# Patient Record
Sex: Female | Born: 1990
Health system: Southern US, Community
[De-identification: ages and names within clinical notes are randomized; demographics above are authoritative.]

## PROBLEM LIST (undated history)

## (undated) ENCOUNTER — Inpatient Hospital Stay (HOSPITAL_COMMUNITY): Payer: Self-pay

## (undated) ENCOUNTER — Emergency Department (HOSPITAL_BASED_OUTPATIENT_CLINIC_OR_DEPARTMENT_OTHER): Admission: EM | Payer: Commercial Managed Care - PPO | Source: Home / Self Care

## (undated) ENCOUNTER — Emergency Department (HOSPITAL_BASED_OUTPATIENT_CLINIC_OR_DEPARTMENT_OTHER): Admission: EM | Payer: Medicaid Other | Source: Home / Self Care

## (undated) DIAGNOSIS — Z972 Presence of dental prosthetic device (complete) (partial): Secondary | ICD-10-CM

## (undated) DIAGNOSIS — R51 Headache: Secondary | ICD-10-CM

## (undated) DIAGNOSIS — O26613 Liver and biliary tract disorders in pregnancy, third trimester: Secondary | ICD-10-CM

## (undated) DIAGNOSIS — D219 Benign neoplasm of connective and other soft tissue, unspecified: Secondary | ICD-10-CM

## (undated) DIAGNOSIS — Z862 Personal history of diseases of the blood and blood-forming organs and certain disorders involving the immune mechanism: Secondary | ICD-10-CM

## (undated) DIAGNOSIS — Z8669 Personal history of other diseases of the nervous system and sense organs: Secondary | ICD-10-CM

## (undated) DIAGNOSIS — Z973 Presence of spectacles and contact lenses: Secondary | ICD-10-CM

## (undated) DIAGNOSIS — K5909 Other constipation: Secondary | ICD-10-CM

## (undated) DIAGNOSIS — O288 Other abnormal findings on antenatal screening of mother: Secondary | ICD-10-CM

## (undated) DIAGNOSIS — K831 Obstruction of bile duct: Secondary | ICD-10-CM

## (undated) DIAGNOSIS — F32A Depression, unspecified: Secondary | ICD-10-CM

## (undated) DIAGNOSIS — J45909 Unspecified asthma, uncomplicated: Secondary | ICD-10-CM

## (undated) DIAGNOSIS — F411 Generalized anxiety disorder: Secondary | ICD-10-CM

## (undated) DIAGNOSIS — D649 Anemia, unspecified: Secondary | ICD-10-CM

## (undated) DIAGNOSIS — F329 Major depressive disorder, single episode, unspecified: Secondary | ICD-10-CM

## (undated) DIAGNOSIS — L309 Dermatitis, unspecified: Secondary | ICD-10-CM

## (undated) HISTORY — DX: Benign neoplasm of connective and other soft tissue, unspecified: D21.9

## (undated) HISTORY — PX: WISDOM TOOTH EXTRACTION: SHX21

## (undated) HISTORY — DX: Depression, unspecified: F32.A

## (undated) HISTORY — DX: Other abnormal findings on antenatal screening of mother: O28.8

## (undated) HISTORY — PX: NO PAST SURGERIES: SHX2092

## (undated) HISTORY — PX: MOUTH SURGERY: SHX715

## (undated) HISTORY — PX: TUBAL LIGATION: SHX77

## (undated) HISTORY — DX: Major depressive disorder, single episode, unspecified: F32.9

## (undated) HISTORY — PX: TYMPANOSTOMY TUBE PLACEMENT: SHX32

## (undated) HISTORY — DX: Anemia, unspecified: D64.9

---
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
## 2011-06-29 ENCOUNTER — Other Ambulatory Visit: Payer: Self-pay | Admitting: Obstetrics and Gynecology

## 2011-06-29 DIAGNOSIS — N6310 Unspecified lump in the right breast, unspecified quadrant: Secondary | ICD-10-CM

## 2011-07-01 ENCOUNTER — Other Ambulatory Visit: Payer: Self-pay | Admitting: Obstetrics and Gynecology

## 2011-07-01 ENCOUNTER — Ambulatory Visit
Admission: RE | Admit: 2011-07-01 | Discharge: 2011-07-01 | Disposition: A | Discharge status: Home / Self Care | Payer: No Typology Code available for payment source | Source: Ambulatory Visit | Attending: Obstetrics and Gynecology | Admitting: Obstetrics and Gynecology

## 2011-07-01 DIAGNOSIS — N6310 Unspecified lump in the right breast, unspecified quadrant: Secondary | ICD-10-CM

## 2012-07-16 ENCOUNTER — Emergency Department (HOSPITAL_COMMUNITY)
Admission: EM | Admit: 2012-07-16 | Discharge: 2012-07-16 | Disposition: A | Discharge status: Home / Self Care | Payer: BC Managed Care – PPO

## 2012-07-16 NOTE — ED Notes (Signed)
Pt decided to sit with her boyfriend in fast track 8 rather then being seen. Nurse aware

## 2013-01-04 ENCOUNTER — Emergency Department (HOSPITAL_COMMUNITY)
Admission: EM | Admit: 2013-01-04 | Discharge: 2013-01-04 | Disposition: A | Discharge status: Home / Self Care | Payer: Medicaid Other | Attending: Emergency Medicine | Admitting: Emergency Medicine

## 2013-01-04 ENCOUNTER — Encounter (HOSPITAL_COMMUNITY): Payer: Self-pay

## 2013-01-04 DIAGNOSIS — N946 Dysmenorrhea, unspecified: Secondary | ICD-10-CM

## 2013-01-04 DIAGNOSIS — Z3202 Encounter for pregnancy test, result negative: Secondary | ICD-10-CM | POA: Insufficient documentation

## 2013-01-04 LAB — URINALYSIS, ROUTINE W REFLEX MICROSCOPIC
Nitrite: NEGATIVE
Specific Gravity, Urine: 1.029 (ref 1.005–1.030)
Urobilinogen, UA: 1 mg/dL (ref 0.0–1.0)

## 2013-01-04 LAB — POCT PREGNANCY, URINE: Preg Test, Ur: NEGATIVE

## 2013-01-04 LAB — URINE MICROSCOPIC-ADD ON

## 2013-01-04 NOTE — ED Provider Notes (Signed)
CSN: [id number]     Arrival date & time 01/04/13  2156 History   First MD Initiated Contact with Patient 01/04/13 2308     Chief Complaint  Patient presents with  . Vaginal Bleeding   (Consider location/radiation/quality/duration/timing/severity/associated sxs/prior Treatment) HPI  Patient is a generally healthy young woman who presents out of concern for irregular vaginal bleeding. She says her LMP was approx 3 wks ago and is usually every 4 week. Today, she developed some vaginal bleeding with midline, mild cramping. No N/V. No unusual vaginal d/c. Patient is not currently taking/using an pregnancy contraceptive.   Lower abd pain is cramping, mild and nonradiating.   History reviewed. No pertinent past medical history. Past Surgical History  Procedure Laterality Date  . Mouth surgery     History reviewed. No pertinent family history. History  Substance Use Topics  . Smoking status: Never Smoker   . Smokeless tobacco: Not on file  . Alcohol Use: No   OB History   Grav Para Term Preterm Abortions TAB SAB Ect Mult Living                 Review of Systems Gen: no weight loss, fevers, chills, night sweats Eyes: no discharge or drainage, no occular pain or visual changes Nose: no epistaxis or rhinorrhea Mouth: no dental pain, no sore throat Neck: no neck pain Lungs: no SOB, cough, wheezing CV: no chest pain, palpitations, dependent edema or orthopnea Abd: no abdominal pain, nausea, vomiting GU: As per history of present illness, otherwise negative MSK: no myalgias or arthralgias Neuro: no headache, no focal neurologic deficits Skin: no rash Psyche: negative.  Allergies  Review of patient's allergies indicates no known allergies.  Home Medications  No current outpatient prescriptions on file. BP 111/77  Pulse 88  Temp(Src) 98 F (36.7 C) (Oral)  Resp 16  Wt 110 lb (49.896 kg)  SpO2 100% Physical Exam Gen: well developed and well nourished appearing Head:  NCAT Eyes: PERL, EOMI Nose: no epistaixis or rhinorrhea Mouth/throat: mucosa is moist and pink Neck: supple, no stridor Lungs: CTA B, no wheezing, rhonchi or rales CV: RRR, no murmur Abd: soft, notender, nondistended Back: no ttp, no cva ttp Skin: no rashese, wnl Neuro: CN ii-xii grossly intact, no focal deficits Psyche; normal affect,  calm and cooperative.   ED Course  Procedures (including critical care time) Labs Review Labs Reviewed  URINALYSIS, ROUTINE W REFLEX MICROSCOPIC - Abnormal; Notable for the following:    APPearance CLOUDY (*)    Hgb urine dipstick MODERATE (*)    Protein, ur 30 (*)    All other components within normal limits  URINE MICROSCOPIC-ADD ON - Abnormal; Notable for the following:    Squamous Epithelial / LPF FEW (*)    All other components within normal limits  POCT PREGNANCY, URINE    MDM  Patient with dysmenorrhea. Reassured that she is not pregnant and that irregular menstrual bleeding is not uncommon. The patient is referral to PCP for further evaluation. She will call [employer] for referral to PCP in network.     Brandt Loosen, MD 01/04/13 [phone]

## 2013-01-04 NOTE — ED Notes (Signed)
Pt reports abnormal heavy vaginal bleeding and lower abd pain starting today. Pt reports increase pain with walking. Pt reports her LNM was mid August, pt is unsure of the actual date.

## 2013-05-02 DIAGNOSIS — R519 Headache, unspecified: Secondary | ICD-10-CM | POA: Insufficient documentation

## 2013-07-16 ENCOUNTER — Encounter (HOSPITAL_COMMUNITY): Payer: Self-pay | Admitting: Emergency Medicine

## 2013-07-16 ENCOUNTER — Emergency Department (HOSPITAL_COMMUNITY)
Admission: EM | Admit: 2013-07-16 | Discharge: 2013-07-17 | Disposition: A | Discharge status: Home / Self Care | Payer: Medicaid Other | Attending: Emergency Medicine | Admitting: Emergency Medicine

## 2013-07-16 DIAGNOSIS — H53149 Visual discomfort, unspecified: Secondary | ICD-10-CM | POA: Insufficient documentation

## 2013-07-16 DIAGNOSIS — Z79899 Other long term (current) drug therapy: Secondary | ICD-10-CM | POA: Insufficient documentation

## 2013-07-16 DIAGNOSIS — R51 Headache: Secondary | ICD-10-CM | POA: Insufficient documentation

## 2013-07-16 DIAGNOSIS — R519 Headache, unspecified: Secondary | ICD-10-CM

## 2013-07-16 NOTE — ED Notes (Signed)
Per pt report: pt c/o of HA for about 4 days.  Pt has tried tylenol and ibuprofen with mild temporary improvement.  Pt denies nausea or sensitivity to light.  Pt has never had a headache for 4 days before and is concerned.  Pt a/o x 4.  Skin warm and dry.  Pt ambulatory in triage.

## 2013-07-17 MED ORDER — IBUPROFEN 800 MG PO TABS: 800.0000 mg | ORAL_TABLET | Freq: Three times a day (TID) | ORAL | Status: DC | PRN

## 2013-07-17 MED ORDER — DIPHENHYDRAMINE HCL 50 MG/ML IJ SOLN
25.0000 mg | Freq: Once | INTRAMUSCULAR | Status: AC
  Administered 2013-07-17: 25 mg via INTRAVENOUS
  Filled 2013-07-17: qty 1

## 2013-07-17 MED ORDER — KETOROLAC TROMETHAMINE 30 MG/ML IJ SOLN
30.0000 mg | Freq: Once | INTRAMUSCULAR | Status: AC
  Administered 2013-07-17: 30 mg via INTRAVENOUS
  Filled 2013-07-17: qty 1

## 2013-07-17 MED ORDER — PROCHLORPERAZINE EDISYLATE 5 MG/ML IJ SOLN
10.0000 mg | Freq: Once | INTRAMUSCULAR | Status: AC
  Administered 2013-07-17: 10 mg via INTRAVENOUS
  Filled 2013-07-17: qty 2

## 2013-07-17 MED ORDER — SODIUM CHLORIDE 0.9 % IV BOLUS (SEPSIS)
2000.0000 mL | Freq: Once | INTRAVENOUS | Status: AC
  Administered 2013-07-17: 2000 mL via INTRAVENOUS

## 2013-07-17 NOTE — ED Provider Notes (Signed)
Medical screening examination/treatment/procedure(s) were performed by non-physician practitioner and as supervising physician I was immediately available for consultation/collaboration.   Delora Fuel, MD 65/46/50 [id number]

## 2013-07-17 NOTE — ED Notes (Signed)
Pt states has had a headache x 4 days, states frontal headache, states would take medication at home and would go to sleep but would wake up and still have headache, states would ease off some then come back, states gets hunger headaches but this had not gone away with eating. Pt states it's more of an aggravating/tension headache.

## 2013-07-17 NOTE — Discharge Instructions (Signed)
Return here as needed.  Increase your fluid intake. 

## 2013-07-17 NOTE — ED Provider Notes (Signed)
CSN: [id number]     Arrival date & time 07/16/13  2255 History   First MD Initiated Contact with Patient 07/17/13 0012     Chief Complaint  Patient presents with  . Headache     (Consider location/radiation/quality/duration/timing/severity/associated sxs/prior Treatment) HPI: [name] is a 23 year old woman who presents to the Emergency Department with chief complaint of headache.  She reports that the headache has been on and off for the last four days and describes it as an "aggravating tension" in her bilateral temples.  At the worst it gets to 6-7/10 in severity.  She has some associated photophobia and states that it is better when she lies down and rests.  She states that ibuprofen and alleve did not alleviate it but tylenol PM helped slightly.  No associated nausea, vomiting, vision changes, or focal neurologic deficits.  Her only past headaches she describes as "hunger headaches" when she had not eaten in awhile, which felt similar but never lasted this long.  She has no first degree relatives with history of migraines, but does have an aunt with "bad headaches" of unknown type.  She does report recent stress with midterms at school.  She also notes that she is nearsighted but broke her glasses about a year ago and has not had corrective lenses since.  She is not regularly evaluated by an ophthalmologist and currently has no PCP.  History reviewed. No pertinent past medical history. Past Surgical History  Procedure Laterality Date  . Mouth surgery    . Wisdom tooth extraction     No family history on file. History  Substance Use Topics  . Smoking status: Never Smoker   . Smokeless tobacco: Not on file  . Alcohol Use: No   OB History   Grav Para Term Preterm Abortions TAB SAB Ect Mult Living                 Review of Systems All other systems negative except as documented in the HPI. All pertinent positives and negatives as reviewed in the HPI.   Allergies  Review of  patient's allergies indicates no known allergies.  Home Medications   Current Outpatient Rx  Name  Route  Sig  Dispense  Refill  . albuterol (PROVENTIL HFA;VENTOLIN HFA) 108 (90 BASE) MCG/ACT inhaler   Inhalation   Inhale 2 puffs into the lungs every 6 (six) hours as needed for wheezing or shortness of breath.         . diphenhydramine-acetaminophen (TYLENOL PM) 25-500 MG TABS   Oral   Take 1 tablet by mouth at bedtime as needed (pain).         Marland Kitchen ibuprofen (ADVIL,MOTRIN) 200 MG tablet   Oral   Take 800 mg by mouth every 6 (six) hours as needed for headache or moderate pain.          BP 113/80  Pulse 81  Temp(Src) 98.2 F (36.8 C) (Oral)  Resp 18  Ht 5' (1.524 m)  Wt 122 lb (55.339 kg)  BMI 23.83 kg/m2  SpO2 99%  LMP 07/09/2013 Physical Exam  Nursing note and vitals reviewed. Constitutional: She is oriented to person, place, and time. She appears well-developed and well-nourished. No distress.  HENT:  Head: Normocephalic and atraumatic.  Mouth/Throat: Oropharynx is clear and moist.  Tenderness to palpation of bilateral temples  Eyes: Conjunctivae and EOM are normal. Pupils are equal, round, and reactive to light.  Neck: Normal range of motion. Neck supple.  Cardiovascular:  Normal rate, regular rhythm and normal heart sounds.  Exam reveals no gallop and no friction rub.   No murmur heard. Pulmonary/Chest: Effort normal and breath sounds normal. No respiratory distress.  Neurological: She is alert and oriented to person, place, and time. She has normal reflexes. No cranial nerve deficit. She exhibits normal muscle tone. Coordination normal.  Skin: Skin is warm and dry.    ED Course  Procedures (including critical care time) Patient is feeling resolution of her headache following IV medications and fluids.  Patient is advised to return here as needed.  Told to followup with her primary care Dr. patient has no neurological deficits noted on exam.  Patient most likely  had a migraine type headache, based on her history of present illness and physical exam    Brent General, PA-C 07/17/13 0129

## 2013-08-30 ENCOUNTER — Encounter (HOSPITAL_COMMUNITY): Payer: Self-pay | Admitting: Emergency Medicine

## 2013-08-30 ENCOUNTER — Emergency Department (HOSPITAL_COMMUNITY)
Admission: EM | Admit: 2013-08-30 | Discharge: 2013-08-30 | Disposition: A | Discharge status: Home / Self Care | Payer: 59 | Attending: Emergency Medicine | Admitting: Emergency Medicine

## 2013-08-30 DIAGNOSIS — J029 Acute pharyngitis, unspecified: Secondary | ICD-10-CM | POA: Insufficient documentation

## 2013-08-30 DIAGNOSIS — H9209 Otalgia, unspecified ear: Secondary | ICD-10-CM | POA: Insufficient documentation

## 2013-08-30 DIAGNOSIS — R590 Localized enlarged lymph nodes: Secondary | ICD-10-CM

## 2013-08-30 DIAGNOSIS — Z79899 Other long term (current) drug therapy: Secondary | ICD-10-CM | POA: Insufficient documentation

## 2013-08-30 DIAGNOSIS — R599 Enlarged lymph nodes, unspecified: Secondary | ICD-10-CM | POA: Insufficient documentation

## 2013-08-30 MED ORDER — DEXAMETHASONE SODIUM PHOSPHATE 10 MG/ML IJ SOLN
10.0000 mg | Freq: Once | INTRAMUSCULAR | Status: AC
  Administered 2013-08-30: 10 mg via INTRAMUSCULAR
  Filled 2013-08-30: qty 1

## 2013-08-30 NOTE — ED Notes (Signed)
Sore throat and sore L sided neck since Tuesday. Denies post nasal drip or congestion. States she was singing hard so she could have pulled something in her neck.

## 2013-08-30 NOTE — ED Notes (Signed)
Pt. reports pain at throat with swelling onset Tuesday this week , airway intact / respirations unlabored . Denies fever or chills. No cough or congestion .

## 2013-08-30 NOTE — ED Provider Notes (Signed)
CSN: [id number]     Arrival date & time 08/30/13  2049 History  This chart was scribed for non-physician practitioner, Monico Blitz, PA-C working with Veryl Speak, MD by Frederich Balding, ED scribe. This patient was seen in room TR07C/TR07C and the patient's care was started at 10:04 PM.    Chief Complaint  Patient presents with  . Sore Throat   The history is provided by the patient. No language interpreter was used.   HPI Comments: Wanda Nixon is a 23 y.o. female who presents to the Emergency Department complaining of gradual onset sore throat that started 3 days ago. Pain radiates to her left ear. Swallowing worsens the pain. She states she is having increased swelling to her lymph node. Denies fever, cough,  Difficulty swallowing, reduced by mouth intake, change in her voice, rhinorrhea.  History reviewed. No pertinent past medical history. Past Surgical History  Procedure Laterality Date  . Mouth surgery    . Wisdom tooth extraction     No family history on file. History  Substance Use Topics  . Smoking status: Never Smoker   . Smokeless tobacco: Not on file  . Alcohol Use: No   OB History   Grav Para Term Preterm Abortions TAB SAB Ect Mult Living                 Review of Systems  Constitutional: Negative for fever.  HENT: Positive for ear pain and sore throat.   Respiratory: Negative for cough.   All other systems reviewed and are negative.  Allergies  Review of patient's allergies indicates no known allergies.  Home Medications   Prior to Admission medications   Medication Sig Start Date End Date Taking? Authorizing Provider  albuterol (PROVENTIL HFA;VENTOLIN HFA) 108 (90 BASE) MCG/ACT inhaler Inhale 2 puffs into the lungs every 6 (six) hours as needed for wheezing or shortness of breath.    Historical Provider, MD  diphenhydramine-acetaminophen (TYLENOL PM) 25-500 MG TABS Take 1 tablet by mouth at bedtime as needed (pain).    Historical Provider, MD   ibuprofen (ADVIL,MOTRIN) 200 MG tablet Take 800 mg by mouth every 6 (six) hours as needed for headache or moderate pain.    Historical Provider, MD  ibuprofen (ADVIL,MOTRIN) 800 MG tablet Take 1 tablet (800 mg total) by mouth every 8 (eight) hours as needed. 07/17/13   [place], PA-C   BP 115/78  Pulse 98  Temp(Src) 98.8 F (37.1 C)  Resp 20  Ht 5' (1.524 m)  Wt 122 lb (55.339 kg)  BMI 23.83 kg/m2  SpO2 100%  Physical Exam  Nursing note and vitals reviewed. Constitutional: She is oriented to person, place, and time. She appears well-developed and well-nourished. No distress.  HENT:  Head: Normocephalic.  Left Ear: Tympanic membrane and ear canal normal.  Mouth/Throat: No oropharyngeal exudate or posterior oropharyngeal edema.  No drooling or stridor. Posterior pharynx mildly injected no significant tonsillar hypertrophy. No exudate. Soft palate rises symmetrically. No TTP or induration under tongue.    Bilateral tympanic membranes with normal architecture good light reflex and no erythema.  Eyes: Conjunctivae and EOM are normal.  Neck: Normal range of motion. Neck supple.  Bilateral anterior cervical lymphadenopathy with tenderness to palpation  Cardiovascular: Normal rate.   Pulmonary/Chest: Effort normal. No stridor.  Musculoskeletal: Normal range of motion.  Lymphadenopathy:    She has cervical adenopathy.  Left anterior lymphadenopathy with tenderness.   Neurological: She is alert and oriented to person, place, and  time.  Psychiatric: She has a normal mood and affect.    ED Course  Procedures (including critical care time)  DIAGNOSTIC STUDIES: Oxygen Saturation is 100% on RA, normal by my interpretation.    COORDINATION OF CARE: 10:07 PM-Discussed treatment plan which includes decadron and motrin with pt at bedside and pt agreed to plan. Advised pt that strep test is not necessary based on history and physical exam. Return precautions given.  Labs  Review Labs Reviewed - No data to display  Imaging Review No results found.   EKG Interpretation None      MDM   Final diagnoses:  None    Filed Vitals:   08/30/13 2102 08/30/13 2144 08/30/13 2214  BP: 115/78  121/84  Pulse: 98  98  Temp:  98.8 F (37.1 C) 98.7 F (37.1 C)  TempSrc: Oral  Oral  Resp: 20  16  Height: 5' (1.524 m)    Weight: 122 lb (55.339 kg)    SpO2: 100%  98%    Medications  dexamethasone (DECADRON) injection 10 mg (10 mg Intramuscular Given 08/30/13 2216)    Wanda Nixon [facility] is a 23 y.o. female presenting with sore throat, ear pain. And anterior cervical lymphadenopathy. No testing for strep pharyngitis based on the Centor criteria. No signs of airway compromise.  Evaluation does not show pathology that would require ongoing emergent intervention or inpatient treatment. Pt is hemodynamically stable and mentating appropriately. Discussed findings and plan with patient/guardian, who agrees with care plan. All questions answered. Return precautions discussed and outpatient follow up given.   Note: Portions of this report may have been transcribed using voice recognition software. Every effort was made to ensure accuracy; however, inadvertent computerized transcription errors may be present   I personally performed the services described in this documentation, which was scribed in my presence. The recorded information has been reviewed and is accurate.  Monico Blitz, PA-C 09/04/13 1533

## 2013-08-30 NOTE — Discharge Instructions (Signed)
For pain control please take Ibuprofen (also known as Motrin or Advil) 400mg  (this is normally 2 over the counter pills) every 6 hours. Take with food to minimize stomach irritation.  Please follow with your primary care doctor in the next 2 days for a check-up. They must obtain records for further management.   Do not hesitate to return to the Emergency Department for any new, worsening or concerning symptoms.   Lymphadenopathy Lymphadenopathy means "disease of the lymph glands." But the term is usually used to describe swollen or enlarged lymph glands, also called lymph nodes. These are the bean-shaped organs found in many locations including the neck, underarm, and groin. Lymph glands are part of the immune system, which fights infections in your body. Lymphadenopathy can occur in just one area of the body, such as the neck, or it can be generalized, with lymph node enlargement in several areas. The nodes found in the neck are the most common sites of lymphadenopathy. CAUSES  When your immune system responds to germs (such as viruses or bacteria ), infection-fighting cells and fluid build up. This causes the glands to grow in size. This is usually not something to worry about. Sometimes, the glands themselves can become infected and inflamed. This is called lymphadenitis. Enlarged lymph nodes can be caused by many diseases:  Bacterial disease, such as strep throat or a skin infection.  Viral disease, such as a common cold.  Other germs, such as lyme disease, tuberculosis, or sexually transmitted diseases.  Cancers, such as lymphoma (cancer of the lymphatic system) or leukemia (cancer of the white blood cells).  Inflammatory diseases such as lupus or rheumatoid arthritis.  Reactions to medications. Many of the diseases above are rare, but important. This is why you should see your caregiver if you have lymphadenopathy. SYMPTOMS   Swollen, enlarged lumps in the neck, back of the head or  other locations.  Tenderness.  Warmth or redness of the skin over the lymph nodes.  Fever. DIAGNOSIS  Enlarged lymph nodes are often near the source of infection. They can help healthcare providers diagnose your illness. For instance:   Swollen lymph nodes around the jaw might be caused by an infection in the mouth.  Enlarged glands in the neck often signal a throat infection.  Lymph nodes that are swollen in more than one area often indicate an illness caused by a virus. Your caregiver most likely will know what is causing your lymphadenopathy after listening to your history and examining you. Blood tests, x-rays or other tests may be needed. If the cause of the enlarged lymph node cannot be found, and it does not go away by itself, then a biopsy may be needed. Your caregiver will discuss this with you. TREATMENT  Treatment for your enlarged lymph nodes will depend on the cause. Many times the nodes will shrink to normal size by themselves, with no treatment. Antibiotics or other medicines may be needed for infection. Only take over-the-counter or prescription medicines for pain, discomfort or fever as directed by your caregiver. HOME CARE INSTRUCTIONS  Swollen lymph glands usually return to normal when the underlying medical condition goes away. If they persist, contact your health-care provider. He/she might prescribe antibiotics or other treatments, depending on the diagnosis. Take any medications exactly as prescribed. Keep any follow-up appointments made to check on the condition of your enlarged nodes.  SEEK MEDICAL CARE IF:   Swelling lasts for more than two weeks.  You have symptoms such as weight loss,  night sweats, fatigue or fever that does not go away.  The lymph nodes are hard, seem fixed to the skin or are growing rapidly.  Skin over the lymph nodes is red and inflamed. This could mean there is an infection. SEEK IMMEDIATE MEDICAL CARE IF:   Fluid starts leaking from the  area of the enlarged lymph node.  You develop a fever of 102 F (38.9 C) or greater.  Severe pain develops (not necessarily at the site of a large lymph node).  You develop chest pain or shortness of breath.  You develop worsening abdominal pain. MAKE SURE YOU:   Understand these instructions.  Will watch your condition.  Will get help right away if you are not doing well or get worse. Document Released: 01/26/2008 Document Revised: 07/11/2011 Document Reviewed: 01/26/2008 [facility] Patient Information 2014 [place].

## 2013-09-07 NOTE — ED Provider Notes (Signed)
Medical screening examination/treatment/procedure(s) were performed by non-physician practitioner and as supervising physician I was immediately available for consultation/collaboration.     Summar Mcglothlin, MD 09/07/13 1930 

## 2013-10-05 ENCOUNTER — Emergency Department (HOSPITAL_COMMUNITY): Payer: BC Managed Care – PPO

## 2013-10-05 ENCOUNTER — Emergency Department (HOSPITAL_COMMUNITY)
Admission: EM | Admit: 2013-10-05 | Discharge: 2013-10-05 | Disposition: A | Discharge status: Home / Self Care | Payer: BC Managed Care – PPO | Attending: Emergency Medicine | Admitting: Emergency Medicine

## 2013-10-05 ENCOUNTER — Encounter (HOSPITAL_COMMUNITY): Payer: Self-pay | Admitting: Emergency Medicine

## 2013-10-05 DIAGNOSIS — T50905A Adverse effect of unspecified drugs, medicaments and biological substances, initial encounter: Secondary | ICD-10-CM

## 2013-10-05 DIAGNOSIS — R000 Tachycardia, unspecified: Secondary | ICD-10-CM | POA: Insufficient documentation

## 2013-10-05 DIAGNOSIS — T38801A Poisoning by unspecified hormones and synthetic substitutes, accidental (unintentional), initial encounter: Secondary | ICD-10-CM | POA: Insufficient documentation

## 2013-10-05 DIAGNOSIS — Z79899 Other long term (current) drug therapy: Secondary | ICD-10-CM | POA: Insufficient documentation

## 2013-10-05 DIAGNOSIS — Z3202 Encounter for pregnancy test, result negative: Secondary | ICD-10-CM | POA: Insufficient documentation

## 2013-10-05 DIAGNOSIS — Y939 Activity, unspecified: Secondary | ICD-10-CM | POA: Insufficient documentation

## 2013-10-05 DIAGNOSIS — T380X1A Poisoning by glucocorticoids and synthetic analogues, accidental (unintentional), initial encounter: Secondary | ICD-10-CM | POA: Insufficient documentation

## 2013-10-05 DIAGNOSIS — Y929 Unspecified place or not applicable: Secondary | ICD-10-CM | POA: Insufficient documentation

## 2013-10-05 DIAGNOSIS — F411 Generalized anxiety disorder: Secondary | ICD-10-CM | POA: Insufficient documentation

## 2013-10-05 LAB — URINALYSIS, ROUTINE W REFLEX MICROSCOPIC
BILIRUBIN URINE: NEGATIVE
Glucose, UA: NEGATIVE mg/dL
Ketones, ur: NEGATIVE mg/dL
NITRITE: NEGATIVE
PH: 6.5 (ref 5.0–8.0)
Protein, ur: NEGATIVE mg/dL
SPECIFIC GRAVITY, URINE: 1.001 — AB (ref 1.005–1.030)
UROBILINOGEN UA: 0.2 mg/dL (ref 0.0–1.0)

## 2013-10-05 LAB — URINE MICROSCOPIC-ADD ON

## 2013-10-05 LAB — I-STAT CHEM 8, ED
BUN: 10 mg/dL (ref 6–23)
CHLORIDE: 105 meq/L (ref 96–112)
Calcium, Ion: 1.18 mmol/L (ref 1.12–1.23)
Creatinine, Ser: 0.8 mg/dL (ref 0.50–1.10)
GLUCOSE: 119 mg/dL — AB (ref 70–99)
HEMATOCRIT: 41 % (ref 36.0–46.0)
HEMOGLOBIN: 13.9 g/dL (ref 12.0–15.0)
POTASSIUM: 3.8 meq/L (ref 3.7–5.3)
SODIUM: 142 meq/L (ref 137–147)
TCO2: 25 mmol/L (ref 0–100)

## 2013-10-05 LAB — PREGNANCY, URINE: Preg Test, Ur: NEGATIVE

## 2013-10-05 MED ORDER — LORAZEPAM 2 MG/ML IJ SOLN
0.5000 mg | Freq: Once | INTRAMUSCULAR | Status: AC
  Administered 2013-10-05: 0.5 mg via INTRAVENOUS
  Filled 2013-10-05: qty 1

## 2013-10-05 MED ORDER — SODIUM CHLORIDE 0.9 % IV BOLUS (SEPSIS)
1000.0000 mL | Freq: Once | INTRAVENOUS | Status: AC
  Administered 2013-10-05: 1000 mL via INTRAVENOUS

## 2013-10-05 MED ORDER — LORAZEPAM 0.5 MG PO TABS: 0.5000 mg | ORAL_TABLET | Freq: Three times a day (TID) | ORAL | Status: DC | PRN

## 2013-10-05 NOTE — Discharge Instructions (Signed)
Nonspecific Tachycardia Tachycardia is a faster than normal heartbeat (more than 100 beats per minute). In adults, the heart normally beats between 60 and 100 times a minute. A fast heartbeat may be a normal response to exercise or stress. It does not necessarily mean that something is wrong. However, sometimes when your heart beats too fast it may not be able to pump enough blood to the rest of your body. This can result in chest pain, shortness of breath, dizziness, and even fainting. Nonspecific tachycardia means that the specific cause or pattern of your tachycardia is unknown. CAUSES  Tachycardia may be harmless or it may be due to a more serious underlying cause. Possible causes of tachycardia include:  Exercise or exertion.  Medication reaction  Fever.  Pain or injury.  Infection.  Loss of body fluids (dehydration).  Overactive thyroid.  Lack of red blood cells (anemia).  Anxiety and stress.  Alcohol.  Caffeine.  Tobacco products.  Diet pills.  Illegal drugs.  Heart disease. SYMPTOMS  Rapid or irregular heartbeat (palpitations).  Suddenly feeling your heart beating (cardiac awareness).  Dizziness.  Tiredness (fatigue).  Shortness of breath.  Chest pain.  Nausea.  Fainting. DIAGNOSIS  Your caregiver will perform a physical exam and take your medical history. In some cases, a heart specialist (cardiologist) may be consulted. Your caregiver may also order:  Blood tests.  Electrocardiography. This test records the electrical activity of your heart.  A heart monitoring test. TREATMENT  Treatment will depend on the likely cause of your tachycardia. The goal is to treat the underlying cause of your tachycardia. Treatment methods may include:  Replacement of fluids or blood through an intravenous (IV) tube for moderate to severe dehydration or anemia.  New medicines or changes in your current medicines.  Diet and lifestyle changes.  Treatment for  certain infections.  Stress relief or relaxation methods. HOME CARE INSTRUCTIONS   Rest.  Drink enough fluids to keep your urine clear or pale yellow.  Do not smoke.  Avoid:  Caffeine.  Tobacco.  Alcohol.  Chocolate.  Stimulants such as over-the-counter diet pills or pills that help you stay awake.  Situations that cause anxiety or stress.  Illegal drugs such as marijuana, phencyclidine (PCP), and cocaine.  Only take medicine as directed by your caregiver.  Keep all follow-up appointments as directed by your caregiver. SEEK IMMEDIATE MEDICAL CARE IF:   You have pain in your chest, upper arms, jaw, or neck.  You become weak, dizzy, or feel faint.  You have palpitations that will not go away.  You vomit, have diarrhea, or pass blood in your stool.  Your skin is cool, pale, and wet.  You have a fever that will not go away with rest, fluids, and medicine. MAKE SURE YOU:   Understand these instructions.  Will watch your condition.  Will get help right away if you are not doing well or get worse. Document Released: 05/26/2004 Document Revised: 07/11/2011 Document Reviewed: 03/29/2011 [facility] Patient Information 2014 [place], [place].

## 2013-10-05 NOTE — ED Provider Notes (Signed)
CSN: [id number]     Arrival date & time 10/05/13  1953 History   First MD Initiated Contact with Patient 10/05/13 2014     Chief Complaint  Patient presents with  . Tachycardia  . Anxiety     (Consider location/radiation/quality/duration/timing/severity/associated sxs/prior Treatment) HPI  Patient to the ER with complaints of heart racing. She says that this has never happened before but started this evening. She can feel her heart get fast and then return to normal. When it starts to race she feels shaky and a little "light". She is unsure if her feeling anxious caused her heart to go faster or if her heart going faster caused her to feel anxious but she is feeling anxious. She denies caffeine intake. She denies being on birth control, no lower extremity swelling, no shortness of breath. Denies alcohol use or recent and abrupt discontinuing of alcohol or medications. She reports that around lunchtime  today she took Pulmicort for the first time, she had never used this medication and it was not prescribed to her. Her intermittent tachycardia started an hour after using this medication.  She says that she could possibly be pregnant.    History reviewed. No pertinent past medical history. Past Surgical History  Procedure Laterality Date  . Mouth surgery    . Wisdom tooth extraction     No family history on file. History  Substance Use Topics  . Smoking status: Never Smoker   . Smokeless tobacco: Not on file  . Alcohol Use: No   OB History   Grav Para Term Preterm Abortions TAB SAB Ect Mult Living                 Review of Systems   Review of Systems  Gen: no weight loss, fevers, chills, night sweats  Eyes: no discharge or drainage, no occular pain or visual changes  Nose: no epistaxis or rhinorrhea  Mouth: no dental pain, no sore throat  Neck: no neck pain  Lungs:No wheezing, coughing or hemoptysis CV: no chest pain, palpitations, dependent edema or orthopnea, +  tachycardia Abd: no abdominal pain, nausea, vomiting, diarrhea GU: no dysuria or gross hematuria  MSK:  No muscle weakness or pain Neuro: no headache, no focal neurologic deficits  Skin: no rash or wounds Psyche: no complaints    Allergies  Review of patient's allergies indicates no known allergies.  Home Medications   Prior to Admission medications   Medication Sig Start Date End Date Taking? Authorizing Provider  albuterol (PROVENTIL HFA;VENTOLIN HFA) 108 (90 BASE) MCG/ACT inhaler Inhale 2 puffs into the lungs every 6 (six) hours as needed for wheezing or shortness of breath.   Yes Historical Provider, MD  budesonide (PULMICORT) 180 MCG/ACT inhaler Inhale 2 puffs into the lungs 2 (two) times daily.   Yes Historical Provider, MD  diphenhydramine-acetaminophen (TYLENOL PM) 25-500 MG TABS Take 1 tablet by mouth at bedtime as needed (pain).   Yes Historical Provider, MD  ibuprofen (ADVIL,MOTRIN) 200 MG tablet Take 800 mg by mouth every 6 (six) hours as needed for headache or moderate pain.   Yes Historical Provider, MD  LORazepam (ATIVAN) 0.5 MG tablet Take 1 tablet (0.5 mg total) by mouth 3 (three) times daily as needed for anxiety. 10/05/13   Haynes Giannotti Marilu Favre, PA-C   BP 122/69  Pulse 108  Temp(Src) 98.6 F (37 C) (Oral)  Resp 28  SpO2 100%  LMP 10/05/2013 Physical Exam  Nursing note and vitals reviewed. Constitutional: She appears well-developed  and well-nourished. No distress.  HENT:  Head: Normocephalic and atraumatic.  Eyes: Pupils are equal, round, and reactive to light.  Neck: Normal range of motion. Neck supple.  Cardiovascular: Regular rhythm.  Tachycardia present.   Pulmonary/Chest: Effort normal.  Abdominal: Soft.  Neurological: She is alert.  Skin: Skin is warm and dry.    ED Course  Procedures (including critical care time) Labs Review Labs Reviewed  URINALYSIS, ROUTINE W REFLEX MICROSCOPIC - Abnormal; Notable for the following:    Specific Gravity, Urine  1.001 (*)    Hgb urine dipstick LARGE (*)    Leukocytes, UA SMALL (*)    All other components within normal limits  URINE MICROSCOPIC-ADD ON - Abnormal; Notable for the following:    Squamous Epithelial / LPF FEW (*)    Bacteria, UA FEW (*)    All other components within normal limits  I-STAT CHEM 8, ED - Abnormal; Notable for the following:    Glucose, Bld 119 (*)    All other components within normal limits  PREGNANCY, URINE    Imaging Review Dg Chest 2 View  10/05/2013   CLINICAL DATA:  Tachycardia  EXAM: CHEST  2 VIEW  COMPARISON:  None.  FINDINGS: The lungs are clear. The heart size and pulmonary vascularity are normal. No pneumothorax. No adenopathy. There is slight anterior wedging of a mid thoracic vertebral body, age uncertain.  IMPRESSION: No edema or consolidation.   Electronically Signed   By: Lowella Grip M.D.   On: 10/05/2013 21:52     EKG Interpretation None     EMMRY, HINSCH FI:[id number] 05-Oct-2013 20:07:52 [facility] ED ROUTINE RECORD Sinus tachycardia Low voltage, precordial leads Borderline T abnormalities, anterior leads 25mm/s 65mm/mV 150Hz  8.0.1 CID: [id number] Referred by: Unconfirmed Vent. rate 111 BPM PR interval 145 ms QRS duration 71 ms QT/QTc 322/437 ms P-R-T axes 65 40 9 06-Jan-1991 (22 yr) Female [facility] Loc:501 Technician: [id number] Test ind:  MDM   Final diagnoses:  Tachycardia  Medication reaction     Spoke  with Dr. Stevie Kern about this patient. He feels that since it started after taking  medicatin then this ism ost likely the culprit in the setting of know other significant risk factors. Her hemoglobin is stable, her electrolytes are WNL, negative urine pregnancy, normal chest xray. Given 1.5 L of normal saline and 1g IV Ativan which helped her pulse slow. Pt told to give it about 12 more hours for the long acting medication to wear off. If still tachycardic or develops symptoms from it then she is to return to  the ED for further evaluation.   22 y.o.Wanda Nixon's evaluation in the Emergency Department is complete. It has been determined that no acute conditions requiring further emergency intervention are present at this time. The patient/guardian have been advised of the diagnosis and plan. We have discussed signs and symptoms that warrant return to the ED, such as changes or worsening in symptoms.  Vital signs are stable at discharge. Filed Vitals:   10/05/13 2300  BP: 122/69  Pulse: 108  Temp: 98.6 F (37 C)  Resp: 28    Patient/guardian has voiced understanding and agreed to follow-up with the PCP or specialist.   October 06, 2013 at 10:25am: I attempted to call patient and see how she is doing today but phone number in chart goes straight to voicemail.   Linus Mako, PA-C 10/06/13 1031

## 2013-10-05 NOTE — ED Notes (Signed)
Pt presents with c/o tachycardia and a burning sensation under her left breast. Pt says that this started about one hour ago. Pt says she has not been able to eat much today. Pt also c/o that her fingertips are feeling numb and she doesn't really feel like herself.

## 2013-10-06 NOTE — ED Provider Notes (Signed)
Medical screening examination/treatment/procedure(s) were performed by non-physician practitioner and as supervising physician I was immediately available for consultation/collaboration.   EKG Interpretation None       Babette Relic, MD 10/06/13 1431

## 2013-10-07 ENCOUNTER — Encounter (HOSPITAL_COMMUNITY): Payer: Self-pay | Admitting: Emergency Medicine

## 2013-10-07 ENCOUNTER — Emergency Department (HOSPITAL_COMMUNITY)
Admission: EM | Admit: 2013-10-07 | Discharge: 2013-10-07 | Disposition: A | Discharge status: Home / Self Care | Payer: BC Managed Care – PPO | Attending: Emergency Medicine | Admitting: Emergency Medicine

## 2013-10-07 DIAGNOSIS — J45909 Unspecified asthma, uncomplicated: Secondary | ICD-10-CM | POA: Diagnosis not present

## 2013-10-07 DIAGNOSIS — R002 Palpitations: Secondary | ICD-10-CM | POA: Insufficient documentation

## 2013-10-07 DIAGNOSIS — Z79899 Other long term (current) drug therapy: Secondary | ICD-10-CM | POA: Diagnosis not present

## 2013-10-07 DIAGNOSIS — R000 Tachycardia, unspecified: Secondary | ICD-10-CM | POA: Insufficient documentation

## 2013-10-07 HISTORY — DX: Unspecified asthma, uncomplicated: J45.909

## 2013-10-07 LAB — BASIC METABOLIC PANEL
BUN: 10 mg/dL (ref 6–23)
CALCIUM: 9.8 mg/dL (ref 8.4–10.5)
CO2: 22 meq/L (ref 19–32)
Chloride: 102 mEq/L (ref 96–112)
Creatinine, Ser: 0.72 mg/dL (ref 0.50–1.10)
GFR calc Af Amer: >90 mL/min (ref >90)
GFR calc non Af Amer: >90 mL/min (ref >90)
Glucose, Bld: 86 mg/dL (ref 70–99)
Potassium: 3.7 mEq/L (ref 3.7–5.3)
Sodium: 139 mEq/L (ref 137–147)

## 2013-10-07 LAB — CBC
HCT: 37.5 % (ref 36.0–46.0)
Hemoglobin: 13.1 g/dL (ref 12.0–15.0)
MCH: 29 pg (ref 26.0–34.0)
MCHC: 34.9 g/dL (ref 30.0–36.0)
MCV: 83.1 fL (ref 78.0–100.0)
PLATELETS: 338 10*3/uL (ref 150–400)
RBC: 4.51 MIL/uL (ref 3.87–5.11)
RDW: 12.5 % (ref 11.5–15.5)
WBC: 5.9 10*3/uL (ref 4.0–10.5)

## 2013-10-07 LAB — D-DIMER, QUANTITATIVE (NOT AT ARMC): D DIMER QUANT: 0.41 ug{FEU}/mL (ref 0.00–0.48)

## 2013-10-07 NOTE — ED Notes (Signed)
Patient is alert and oriented x3.  She is complaining of heart racing that started on Saturday  After taking pulmicort.  Patient states that she has taken this medication when she was younger  And had the medications inhaler.  She took the medication after feeling like she was having an asthma  Attack.  She has not taken the medications since Saturday but continues to have the heart racing intermittently. She states she is not having any chest pain.

## 2013-10-07 NOTE — ED Notes (Signed)
Assumed care of patient  Patient seen in ED two days ago for same complaint after using Pulmicort Patient in NAD at this time Awaiting provider

## 2013-10-07 NOTE — ED Provider Notes (Signed)
CSN: [id number]     Arrival date & time 10/07/13  1901 History   First MD Initiated Contact with Patient 10/07/13 2035     Chief Complaint  Patient presents with  . Palpitations     (Consider location/radiation/quality/duration/timing/severity/associated sxs/prior Treatment) HPI Comments: 23 year old female with a past medical history of asthma presents to the emergency department complaining of heart palpitations x2 days. Patient was seen in the emergency department 2 days ago complaining of the same, at that time symptoms started after she took Pulmicort. She has not taken Pulmicort since being seen 2 days ago, but states her palpitations have continued. Palpitations are intermittent, coming and going at random, nothing in specific making it come or go. States she is not under any increased stress and does not feel anxious. She is getting married next month, however she does not feel overwhelmed at this time. When she experiences the palpitations she states she "does not feel right" and has a decreased appetite. Denies caffeine intake. Denies oral contraceptive use, recent travel, recent surgeries. Denies chest pain or shortness of breath. No calf pain or swelling. When she was seen in the ED 2 days ago she had a negative pregnancy, urinalysis and i-STAT that may without any acute findings.  Patient is a 23 y.o. female presenting with palpitations. The history is provided by the patient.  Palpitations   Past Medical History  Diagnosis Date  . Asthma    Past Surgical History  Procedure Laterality Date  . Mouth surgery    . Wisdom tooth extraction     History reviewed. No pertinent family history. History  Substance Use Topics  . Smoking status: Never Smoker   . Smokeless tobacco: Not on file  . Alcohol Use: No   OB History   Grav Para Term Preterm Abortions TAB SAB Ect Mult Living                 Review of Systems  Constitutional: Positive for appetite change.  Cardiovascular:  Positive for palpitations.  All other systems reviewed and are negative.     Allergies  Pulmicort and Tomato  Home Medications   Prior to Admission medications   Medication Sig Start Date End Date Taking? Authorizing Provider  albuterol (PROVENTIL HFA;VENTOLIN HFA) 108 (90 BASE) MCG/ACT inhaler Inhale 2 puffs into the lungs every 6 (six) hours as needed for wheezing or shortness of breath.   Yes Historical Provider, MD  budesonide (PULMICORT) 180 MCG/ACT inhaler Inhale 2 puffs into the lungs 2 (two) times daily.   Yes Historical Provider, MD  diphenhydramine-acetaminophen (TYLENOL PM) 25-500 MG TABS Take 1 tablet by mouth at bedtime as needed (pain).   Yes Historical Provider, MD  ibuprofen (ADVIL,MOTRIN) 200 MG tablet Take 800 mg by mouth every 6 (six) hours as needed for headache or moderate pain.   Yes Historical Provider, MD  LORazepam (ATIVAN) 0.5 MG tablet Take 1 tablet (0.5 mg total) by mouth 3 (three) times daily as needed for anxiety. 10/05/13   Tiffany Marilu Favre, PA-C   Pulse 105  Temp(Src) 99.2 F (37.3 C) (Oral)  Resp 22  SpO2 97%  LMP 10/05/2013 Physical Exam  Nursing note and vitals reviewed. Constitutional: She is oriented to person, place, and time. She appears well-developed and well-nourished. No distress.  HENT:  Head: Normocephalic and atraumatic.  Mouth/Throat: Oropharynx is clear and moist.  Eyes: Conjunctivae and EOM are normal. Pupils are equal, round, and reactive to light.  Neck: Normal range of motion. Neck  supple. No JVD present.  Cardiovascular: Regular rhythm, normal heart sounds and intact distal pulses.  Tachycardia present.   No extremity edema.  Pulmonary/Chest: Effort normal and breath sounds normal. No respiratory distress.  Abdominal: Soft. Bowel sounds are normal. There is no tenderness.  Musculoskeletal: Normal range of motion. She exhibits no edema.  Neurological: She is alert and oriented to person, place, and time. She has normal strength.  No sensory deficit.  Speech fluent, goal oriented. Moves limbs without ataxia. Equal grip strength bilateral.  Skin: Skin is warm and dry. She is not diaphoretic.  Psychiatric: She has a normal mood and affect. Her speech is normal and behavior is normal.    ED Course  Procedures (including critical care time) Labs Review Labs Reviewed  CBC  BASIC METABOLIC PANEL  D-DIMER, QUANTITATIVE    Imaging Review No results found.   EKG Interpretation None      MDM   Final diagnoses:  Palpitations   Patient presenting with continued palpitations after being seen in the emergency department 2 days ago. She has not taken Pulmicort in 2 days and symptoms persist. She is well appearing and in no apparent distress. Tachycardic, vital signs otherwise stable. No associated chest pain or shortness of breath. No risk factors for PE. Given this is patient's second visit her palpitations with tachycardia, cannot rule out PE. Will check d-dimer. Labs pending. 10:05 PM Labs normal. D-dimer within normal limits. Patient remains in no apparent distress. States she was prescribed pills for anxiety and her last ED visit. I advised her to try taking his to see if it helps with her symptoms at home. She is in the process of finding a PCP. Resources given. Stable for discharge. Return precautions given. Patient states understanding of treatment care plan and is agreeable.  Illene Labrador, PA-C 10/07/13 2206

## 2013-10-07 NOTE — Discharge Instructions (Signed)
Palpitations  A palpitation is the feeling that your heartbeat is irregular or is faster than normal. It may feel like your heart is fluttering or skipping a beat. Palpitations are usually not a serious problem. However, in some cases, you may need further medical evaluation. CAUSES  Palpitations can be caused by:  Smoking.  Caffeine or other stimulants, such as diet pills or energy drinks.  Alcohol.  Stress and anxiety.  Strenuous physical activity.  Fatigue.  Certain medicines.  Heart disease, especially if you have a history of arrhythmias. This includes atrial fibrillation, atrial flutter, or supraventricular tachycardia.  An improperly working pacemaker or defibrillator. DIAGNOSIS  To find the cause of your palpitations, your caregiver will take your history and perform a physical exam. Tests may also be done, including:  Electrocardiography (ECG). This test records the heart's electrical activity.  Cardiac monitoring. This allows your caregiver to monitor your heart rate and rhythm in real time.  Holter monitor. This is a portable device that records your heartbeat and can help diagnose heart arrhythmias. It allows your caregiver to track your heart activity for several days, if needed.  Stress tests by exercise or by giving medicine that makes the heart beat faster. TREATMENT  Treatment of palpitations depends on the cause of your symptoms and can vary greatly. Most cases of palpitations do not require any treatment other than time, relaxation, and monitoring your symptoms. Other causes, such as atrial fibrillation, atrial flutter, or supraventricular tachycardia, usually require further treatment. HOME CARE INSTRUCTIONS   Avoid:  Caffeinated coffee, tea, soft drinks, diet pills, and energy drinks.  Chocolate.  Alcohol.  Stop smoking if you smoke.  Reduce your stress and anxiety. Things that can help you relax include:  A method that measures bodily functions  so you can learn to control them (biofeedback).  Yoga.  Meditation.  Physical activity such as swimming, jogging, or walking.  Get plenty of rest and sleep. SEEK MEDICAL CARE IF:   You continue to have a fast or irregular heartbeat beyond 24 hours.  Your palpitations occur more often. SEEK IMMEDIATE MEDICAL CARE IF:  You develop chest pain or shortness of breath.  You have a severe headache.  You feel dizzy, or you faint. MAKE SURE YOU:  Understand these instructions.  Will watch your condition.  Will get help right away if you are not doing well or get worse. Document Released: 04/15/2000 Document Revised: 08/13/2012 Document Reviewed: 06/17/2011 [facility] Patient Information 2014 [place].   RESOURCE GUIDE  Chronic Pain Problems: Contact [facility]  [phone] Patients need to be referred by their primary care doctor.  Insufficient Money for Medicine: Contact [employer]:  call "211."   No Primary Care Doctor: - Call [facility]  [phone] - can help you locate a primary care doctor that  accepts your insurance, provides certain services, etc. - Physician Referral Service- [phone]  Agencies that provide inexpensive medical care: - [facility]  [phone] - [facility]  [phone] - [facility]  [phone] - [facility]  [phone] - [facility]  [phone] - [facility]  [phone]  [place] Providers: - [facility]- [address], [address]  [phone], Mon-Fri 9am-7pm, Sat 9am-1pm - [place], [place], [address]  [place]- [address]  [phone] -  Lucianne Lei- [address], [address], [phone]  Only accepts [employer] patients after they have their name   applied to their card  Self Pay (no insurance) in [facility]: - Sickle Cell Patients: Dr Kevan Ny, [facility]  [place], [facility]- [place]  [facility] [place]- [address], [facility]- see information above (Speak to [employer] if you do not have insurance)       -  [facility]- [place],  [place], [place]  Dr Vista Lawman-  [address], [place], [place], [place]       -  [facility] and [facility] - [address], [phone]       -  [facility] - [address], [place], also [address], [phone]       -    [facility]- [place], [place], 1st & 3rd Saturday        every month, 10am-1pm  1) Find a Doctor and Pay Out of Pocket Although you won't have to find out who is covered by your insurance plan, it is a good idea to ask around and get recommendations. You will then need to call the office and see if the doctor you have chosen will accept you as a new patient and what types of options they offer for patients who are self-pay. Some doctors offer discounts or will set up payment plans for their patients who do not have insurance, but you will need to ask so you aren't surprised when you get to your appointment.  2) Contact Your Local Health Department Not all health departments have doctors that can see patients for sick visits, but many do, so it is worth a call to see if yours does. If you don't know where your local health department is, you can check in your phone book. The CDC also has a tool to help you locate your state's health department, and many state websites also have listings of all of their local health departments.  3) Find a [facility] If your illness is not likely to be very  severe or complicated, you may want to try a walk in clinic. These are popping up all over the country in pharmacies, drugstores, and shopping centers. They're usually staffed by nurse practitioners or physician assistants that have been trained to treat common illnesses and complaints. They're usually fairly quick and inexpensive. However, if you have serious medical issues or chronic medical problems, these are probably not your best option

## 2013-10-08 NOTE — ED Provider Notes (Signed)
Medical screening examination/treatment/procedure(s) were performed by non-physician practitioner and as supervising physician I was immediately available for consultation/collaboration.   EKG Interpretation None       Virgel Manifold, MD 10/08/13 [phone]

## 2013-10-10 ENCOUNTER — Emergency Department (HOSPITAL_COMMUNITY): Payer: 59

## 2013-10-10 ENCOUNTER — Encounter (HOSPITAL_COMMUNITY): Payer: Self-pay | Admitting: Emergency Medicine

## 2013-10-10 ENCOUNTER — Emergency Department (HOSPITAL_COMMUNITY)
Admission: EM | Admit: 2013-10-10 | Discharge: 2013-10-10 | Disposition: A | Discharge status: Home / Self Care | Payer: 59 | Attending: Emergency Medicine | Admitting: Emergency Medicine

## 2013-10-10 DIAGNOSIS — Z91018 Allergy to other foods: Secondary | ICD-10-CM | POA: Insufficient documentation

## 2013-10-10 DIAGNOSIS — Z79899 Other long term (current) drug therapy: Secondary | ICD-10-CM | POA: Insufficient documentation

## 2013-10-10 DIAGNOSIS — F419 Anxiety disorder, unspecified: Secondary | ICD-10-CM

## 2013-10-10 DIAGNOSIS — F411 Generalized anxiety disorder: Secondary | ICD-10-CM | POA: Insufficient documentation

## 2013-10-10 DIAGNOSIS — J45909 Unspecified asthma, uncomplicated: Secondary | ICD-10-CM | POA: Insufficient documentation

## 2013-10-10 DIAGNOSIS — Z888 Allergy status to other drugs, medicaments and biological substances status: Secondary | ICD-10-CM | POA: Insufficient documentation

## 2013-10-10 DIAGNOSIS — R000 Tachycardia, unspecified: Secondary | ICD-10-CM | POA: Insufficient documentation

## 2013-10-10 LAB — CBC WITH DIFFERENTIAL/PLATELET
BASOS ABS: 0 10*3/uL (ref 0.0–0.1)
BASOS PCT: 0 % (ref 0–1)
Eosinophils Absolute: 0 10*3/uL (ref 0.0–0.7)
Eosinophils Relative: 0 % (ref 0–5)
HEMATOCRIT: 39.8 % (ref 36.0–46.0)
Hemoglobin: 14 g/dL (ref 12.0–15.0)
LYMPHS PCT: 21 % (ref 12–46)
Lymphs Abs: 1.2 10*3/uL (ref 0.7–4.0)
MCH: 29.3 pg (ref 26.0–34.0)
MCHC: 35.2 g/dL (ref 30.0–36.0)
MCV: 83.3 fL (ref 78.0–100.0)
MONO ABS: 0.3 10*3/uL (ref 0.1–1.0)
Monocytes Relative: 5 % (ref 3–12)
NEUTROS ABS: 4.2 10*3/uL (ref 1.7–7.7)
Neutrophils Relative %: 74 % (ref 43–77)
PLATELETS: 383 10*3/uL (ref 150–400)
RBC: 4.78 MIL/uL (ref 3.87–5.11)
RDW: 12.5 % (ref 11.5–15.5)
WBC: 5.7 10*3/uL (ref 4.0–10.5)

## 2013-10-10 LAB — HCG, SERUM, QUALITATIVE: Preg, Serum: NEGATIVE

## 2013-10-10 LAB — BASIC METABOLIC PANEL
BUN: 8 mg/dL (ref 6–23)
CALCIUM: 9.8 mg/dL (ref 8.4–10.5)
CHLORIDE: 101 meq/L (ref 96–112)
CO2: 22 mEq/L (ref 19–32)
CREATININE: 0.73 mg/dL (ref 0.50–1.10)
GFR calc non Af Amer: >90 mL/min (ref >90)
Glucose, Bld: 85 mg/dL (ref 70–99)
Potassium: 3.4 mEq/L — ABNORMAL LOW (ref 3.7–5.3)
Sodium: 141 mEq/L (ref 137–147)

## 2013-10-10 LAB — TSH: TSH: 1.67 u[IU]/mL (ref 0.350–4.500)

## 2013-10-10 MED ORDER — LORAZEPAM 2 MG/ML IJ SOLN
1.0000 mg | Freq: Once | INTRAMUSCULAR | Status: AC
  Administered 2013-10-10: 1 mg via INTRAVENOUS
  Filled 2013-10-10: qty 1

## 2013-10-10 MED ORDER — SODIUM CHLORIDE 0.9 % IV BOLUS (SEPSIS)
1000.0000 mL | Freq: Once | INTRAVENOUS | Status: AC
  Administered 2013-10-10: 1000 mL via INTRAVENOUS

## 2013-10-10 NOTE — ED Notes (Signed)
Pt states that she has been seen twice already in the same week for same.  States that she started having palpitations after using pulmicort.  States that she hasn't used it in a few days but still feels "funny".

## 2013-10-10 NOTE — ED Notes (Signed)
Pt advised to have anti-anxiety prescription filled from previous visit. No other questions. Ambulatory. In NAD.

## 2013-10-10 NOTE — ED Provider Notes (Signed)
CSN: [id number]     Arrival date & time 10/10/13  1511 History   First MD Initiated Contact with Patient 10/10/13 1823     Chief Complaint  Patient presents with  . Palpitations     (Consider location/radiation/quality/duration/timing/severity/associated sxs/prior Treatment) HPI  Wanda Nixon is a 23 y.o. female past medical history significant for asthma complaining of palpitations onset 5 days ago associated with decreased appetite and lightheaded sensation. This all started after she used her Pulmicort, has not used Pulmicort in 5 days. Patient denies any chest pain, shortness of breath, syncope, history of DVT or PE, recent travel or immobilization, contraceptive use, abdominal pain, change in bowel or bladder habits. States that she's been trying to stay hydrated but is eating much less than normal. Patient has been seen for similar complaints several times over the course of the last 5 days. Her negative d-dimer 72 hours ago. Patient was given Ativan but has not taken it because she is concerned about the potential side effects of depression. States she does feel anxious. Has no history of panic or anxiety attacks. States that she gets nervous and she feels her heart rate which contributes to the nervousness and so on and so forth. Denies excessive caffeine, or energy drink use, drug or alcohol abuse, depression, SI, HI, audiovisual hallucinations. Patient has not made any appointments to followup with primary care. States she is gettingmarried next month in his very anxious. Has difficulty sleeping at night because she is worried.    Past Medical History  Diagnosis Date  . Asthma    Past Surgical History  Procedure Laterality Date  . Mouth surgery    . Wisdom tooth extraction     No family history on file. History  Substance Use Topics  . Smoking status: Never Smoker   . Smokeless tobacco: Not on file  . Alcohol Use: No   OB History   Grav Para Term Preterm  Abortions TAB SAB Ect Mult Living                 Review of Systems  10 systems reviewed and found to be negative, except as noted in the HPI.   Allergies  Pulmicort and Tomato  Home Medications   Prior to Admission medications   Medication Sig Start Date End Date Taking? Authorizing Provider  acetaminophen (TYLENOL) 500 MG tablet Take 500 mg by mouth every 6 (six) hours as needed for mild pain.   Yes Historical Provider, MD  albuterol (PROVENTIL HFA;VENTOLIN HFA) 108 (90 BASE) MCG/ACT inhaler Inhale 2 puffs into the lungs every 6 (six) hours as needed for wheezing or shortness of breath.   Yes Historical Provider, MD  hydrOXYzine (ATARAX/VISTARIL) 25 MG tablet Take 25 mg by mouth 3 (three) times daily as needed for anxiety.   Yes Historical Provider, MD  LORazepam (ATIVAN) 0.5 MG tablet Take 1 tablet (0.5 mg total) by mouth 3 (three) times daily as needed for anxiety. 10/05/13  Yes Tiffany Marilu Favre, PA-C  budesonide (PULMICORT) 180 MCG/ACT inhaler Inhale 2 puffs into the lungs 2 (two) times daily.    Historical Provider, MD   BP 117/84  Pulse 100  Temp(Src) 98.5 F (36.9 C) (Oral)  Resp 16  SpO2 100%  LMP 10/05/2013 Physical Exam  Nursing note and vitals reviewed. Constitutional: She is oriented to person, place, and time. She appears well-developed and well-nourished. No distress.  HENT:  Head: Normocephalic and atraumatic.  Mouth/Throat: Oropharynx is clear and moist.  Dry  mucous membranes  Eyes: Conjunctivae and EOM are normal. Pupils are equal, round, and reactive to light.  Neck: Normal range of motion.  Cardiovascular: Regular rhythm and intact distal pulses.   Variably tachycardic from 97 to 130s  Pulmonary/Chest: Effort normal and breath sounds normal. No stridor. No respiratory distress. She has no wheezes. She has no rales. She exhibits no tenderness.  Abdominal: Soft. Bowel sounds are normal. She exhibits no distension and no mass. There is no tenderness. There is  no rebound and no guarding.  Musculoskeletal: Normal range of motion. She exhibits no edema and no tenderness.  No calf asymmetry, superficial collaterals, palpable cords, edema, Homans sign negative bilaterally.    Neurological: She is alert and oriented to person, place, and time.  Psychiatric: She has a normal mood and affect.    ED Course  Procedures (including critical care time) Labs Review Labs Reviewed  BASIC METABOLIC PANEL - Abnormal; Notable for the following:    Potassium 3.4 (*)    All other components within normal limits  CBC WITH DIFFERENTIAL  HCG, SERUM, QUALITATIVE  TSH    Imaging Review Dg Chest 2 View  10/10/2013   CLINICAL DATA:  Heart palpitations  EXAM: CHEST  2 VIEW  COMPARISON:  10/05/2013  FINDINGS: The heart size and mediastinal contours are within normal limits. Both lungs are clear. The visualized skeletal structures are unremarkable.  IMPRESSION: No active cardiopulmonary disease.   Electronically Signed   By: Inez Catalina M.D.   On: 10/10/2013 17:05     EKG Interpretation None     Sinus tach at 103 beats per minute, borderline QTC prolongation, normal axis, normal intervals, nonspecific ST-T wave changes  MDM   Final diagnoses:  Tachycardia  Anxiety    Filed Vitals:   10/10/13 2152 10/10/13 2153 10/10/13 2154 10/10/13 2301  BP: 132/82   117/84  Pulse:  102 106 100  Temp:    98.5 F (36.9 C)  TempSrc:      Resp:  13 19 16   SpO2:  100% 100% 100%    Wanda Nixon is a 23 y.o. female presenting with palpitations and lightheaded sensation. Patient has been seen for similar 3 times the last 5 days. Negative workups all times. Presumed diagnosis of anxiety. Patient had negative d-dimer 72 hours ago. Physical exam is not consistent with DVT or PE. Patient is not orthostatic, she is not anemic, she is not pregnant, lung sounds are clear and she is saturating well on room air, she is not tachypnea. EKG with no acute abnormality. Upon  further questioning patient reports that she is very anxious and that she is nervous about her wedding coming up in one month. I've advised patient that it is important to take her medications as needed and establish primary care. Patient verbalized her understanding. Heart rate returns to normal range after she is calm.  Evaluation does not show pathology that would require ongoing emergent intervention or inpatient treatment. Pt is hemodynamically stable and mentating appropriately. Discussed findings and plan with patient/guardian, who agrees with care plan. All questions answered. Return precautions discussed and outpatient follow up given.       Monico Blitz, PA-C 10/11/13 0206

## 2013-10-10 NOTE — Discharge Instructions (Signed)
Wanda Nixon, please take your anxiety medication as needed, do not drive when you are taking Ativan because it makes her drowsy and slow your reaction time.. Don't forget and make an appointment with a primary care doctor. Read about anxiety. Enjoy your wedding !!!  Do not hesitate to return to the Emergency Department for any new, worsening or concerning symptoms.   If you do not have a primary care doctor you can establish one at the   [facility]: [place] [phone]  After you establish care. Let them know you were seen in the emergency room. They must obtain records for further management.

## 2013-10-10 NOTE — ED Notes (Signed)
Patient called out for assistance. Entered patient room to find her slightly tachycardic at 109. Patient was concerned her monitor was "flashing red & reading apenic". Talked to patient explained monitor, reassured patient that she was ok. Patient continues to appear anxious but reports "I feel better since the medicine". PA was updated, and entered patient room to speak with her. Patient ok to eat per PA. PA also requested patient bedside monitor be turned off as she feels this is contributing to the patient's anxiety due to incidental alarms.

## 2013-10-10 NOTE — ED Notes (Signed)
Initial contact- A&O x4. Has been feeling palpitations since Saturday. Says she knows her HR is high because she "feels a nervous feeling." Denies chest pain, fevers, chills, abdominal pain. Denies N, V, D. In NAD. Awaiting MD.

## 2013-10-16 ENCOUNTER — Encounter (HOSPITAL_COMMUNITY): Payer: Self-pay | Admitting: Emergency Medicine

## 2013-10-16 ENCOUNTER — Emergency Department (HOSPITAL_COMMUNITY)
Admission: EM | Admit: 2013-10-16 | Discharge: 2013-10-16 | Disposition: A | Discharge status: Home / Self Care | Payer: BC Managed Care – PPO | Attending: Emergency Medicine | Admitting: Emergency Medicine

## 2013-10-16 DIAGNOSIS — R002 Palpitations: Secondary | ICD-10-CM | POA: Insufficient documentation

## 2013-10-16 DIAGNOSIS — F419 Anxiety disorder, unspecified: Secondary | ICD-10-CM

## 2013-10-16 DIAGNOSIS — E876 Hypokalemia: Secondary | ICD-10-CM | POA: Insufficient documentation

## 2013-10-16 DIAGNOSIS — R0789 Other chest pain: Secondary | ICD-10-CM | POA: Insufficient documentation

## 2013-10-16 DIAGNOSIS — Z79899 Other long term (current) drug therapy: Secondary | ICD-10-CM | POA: Insufficient documentation

## 2013-10-16 DIAGNOSIS — J45909 Unspecified asthma, uncomplicated: Secondary | ICD-10-CM | POA: Insufficient documentation

## 2013-10-16 DIAGNOSIS — F411 Generalized anxiety disorder: Secondary | ICD-10-CM | POA: Insufficient documentation

## 2013-10-16 LAB — COMPREHENSIVE METABOLIC PANEL
ALK PHOS: 52 U/L (ref 39–117)
ALT: 6 U/L (ref 0–35)
AST: 15 U/L (ref 0–37)
Albumin: 4.7 g/dL (ref 3.5–5.2)
BILIRUBIN TOTAL: 0.6 mg/dL (ref 0.3–1.2)
BUN: 6 mg/dL (ref 6–23)
CHLORIDE: 102 meq/L (ref 96–112)
CO2: 23 mEq/L (ref 19–32)
CREATININE: 0.7 mg/dL (ref 0.50–1.10)
Calcium: 9.9 mg/dL (ref 8.4–10.5)
GFR calc Af Amer: >90 mL/min (ref >90)
Glucose, Bld: 90 mg/dL (ref 70–99)
POTASSIUM: 3.4 meq/L — AB (ref 3.7–5.3)
Sodium: 140 mEq/L (ref 137–147)
Total Protein: 8 g/dL (ref 6.0–8.3)

## 2013-10-16 LAB — CBC WITH DIFFERENTIAL/PLATELET
BASOS ABS: 0 10*3/uL (ref 0.0–0.1)
Basophils Relative: 0 % (ref 0–1)
Eosinophils Absolute: 0.1 10*3/uL (ref 0.0–0.7)
Eosinophils Relative: 1 % (ref 0–5)
HCT: 38.9 % (ref 36.0–46.0)
HEMOGLOBIN: 13.8 g/dL (ref 12.0–15.0)
LYMPHS PCT: 30 % (ref 12–46)
Lymphs Abs: 1.7 10*3/uL (ref 0.7–4.0)
MCH: 29 pg (ref 26.0–34.0)
MCHC: 35.5 g/dL (ref 30.0–36.0)
MCV: 81.7 fL (ref 78.0–100.0)
MONO ABS: 0.5 10*3/uL (ref 0.1–1.0)
Monocytes Relative: 8 % (ref 3–12)
NEUTROS ABS: 3.6 10*3/uL (ref 1.7–7.7)
Neutrophils Relative %: 61 % (ref 43–77)
Platelets: 377 10*3/uL (ref 150–400)
RBC: 4.76 MIL/uL (ref 3.87–5.11)
RDW: 12.6 % (ref 11.5–15.5)
WBC: 5.8 10*3/uL (ref 4.0–10.5)

## 2013-10-16 LAB — MAGNESIUM: Magnesium: 1.9 mg/dL (ref 1.5–2.5)

## 2013-10-16 MED ORDER — POTASSIUM CHLORIDE CRYS ER 20 MEQ PO TBCR: 20.0000 meq | EXTENDED_RELEASE_TABLET | Freq: Two times a day (BID) | ORAL | Status: DC

## 2013-10-16 NOTE — ED Notes (Signed)
MD at bedside. Dr. Knapp at bedside.  

## 2013-10-16 NOTE — ED Notes (Signed)
Patient is here with c/o chest pain and felt like her heart was skipping a beat that started this afternoon. Patient states she was started on ativan and medication to help her sleep. Patient states she has been feeling sluggish as well. Patient denies chest pain at this time. Patient was recently treated for anxiety here in the ED.

## 2013-10-16 NOTE — ED Notes (Signed)
Pt ambulatory to exam room with steady gait.  

## 2013-10-16 NOTE — ED Provider Notes (Signed)
CSN: [id number]     Arrival date & time 10/16/13  1927 History   First MD Initiated Contact with Patient 10/16/13 2035     No chief complaint on file.    (Consider location/radiation/quality/duration/timing/severity/associated sxs/prior Treatment) HPI  PCP none this is this patient's fourth ED visit this month. She reports she is getting married next month. She reports a lot of anxiety. She reports a lot of difficulty sleeping. She states she had chest pain today about 5:30 PM. The pain was underneath her right breast. It was sort of sharp, sort of dull. It lasted a few seconds. She states she had "one or 2 episodes". She denies shortness of breath, diaphoresis, nausea or vomiting. She states she felt a "thump" in her chest which scared her. She was prescribed Ativan during one of her ER visits. However she states she's afraid to take it. She did take it yesterday. She also states she started a new job next week.  Patient states she had her first appointment with her PCP tomorrow however she did not have a $40 co-pay.  PCP none  Past Medical History  Diagnosis Date  . Asthma   . Anxiety    Past Surgical History  Procedure Laterality Date  . Mouth surgery    . Wisdom tooth extraction     No family history on file. History  Substance Use Topics  . Smoking status: Never Smoker   . Smokeless tobacco: Not on file  . Alcohol Use: No  employed   OB History   Grav Para Term Preterm Abortions TAB SAB Ect Mult Living                 Review of Systems  All other systems reviewed and are negative.     Allergies  Pulmicort and Tomato  Home Medications   Prior to Admission medications   Medication Sig Start Date End Date Taking? Authorizing Kris Burd  acetaminophen (TYLENOL) 500 MG tablet Take 500 mg by mouth every 6 (six) hours as needed for mild pain.   Yes Historical Kaydon Husby, MD  albuterol (PROVENTIL HFA;VENTOLIN HFA) 108 (90 BASE) MCG/ACT inhaler Inhale 2 puffs into the  lungs every 6 (six) hours as needed for wheezing or shortness of breath.   Yes Historical Colisha Redler, MD  hydrOXYzine (ATARAX/VISTARIL) 25 MG tablet Take 25 mg by mouth 3 (three) times daily as needed for anxiety.   Yes Historical Cassandra Mcmanaman, MD  loperamide (IMODIUM) 1 MG/5ML solution Take 2 mg by mouth as needed for diarrhea or loose stools.   Yes Historical Brizeida Mcmurry, MD  LORazepam (ATIVAN) 0.5 MG tablet Take 1 tablet (0.5 mg total) by mouth 3 (three) times daily as needed for anxiety. 10/05/13  Yes Tiffany Marilu Favre, PA-C  potassium chloride SA (K-DUR,KLOR-CON) 20 MEQ tablet Take 1 tablet (20 mEq total) by mouth 2 (two) times daily. 10/16/13   Janice Norrie, MD   BP 116/81  Pulse 89  Temp(Src) 99.9 F (37.7 C)  Resp 18  SpO2 100%  LMP 10/05/2013  Vital signs normal   Physical Exam  Nursing note and vitals reviewed. Constitutional: She is oriented to person, place, and time. She appears well-developed and well-nourished.  Non-toxic appearance. She does not appear ill. No distress.  HENT:  Head: Normocephalic and atraumatic.  Right Ear: External ear normal.  Left Ear: External ear normal.  Nose: Nose normal. No mucosal edema or rhinorrhea.  Mouth/Throat: Oropharynx is clear and moist and mucous membranes are normal. No dental abscesses  or uvula swelling.  Eyes: Conjunctivae and EOM are normal. Pupils are equal, round, and reactive to light.  Neck: Normal range of motion and full passive range of motion without pain. Neck supple.  Cardiovascular: Normal rate, regular rhythm and normal heart sounds.  Exam reveals no gallop and no friction rub.   No murmur heard. Pulmonary/Chest: Effort normal and breath sounds normal. No respiratory distress. She has no wheezes. She has no rhonchi. She has no rales. She exhibits no tenderness and no crepitus.  Abdominal: Soft. Normal appearance and bowel sounds are normal. She exhibits no distension. There is no tenderness. There is no rebound and no guarding.   Musculoskeletal: Normal range of motion. She exhibits no edema and no tenderness.  Moves all extremities well.   Neurological: She is alert and oriented to person, place, and time. She has normal strength. No cranial nerve deficit.  Skin: Skin is warm, dry and intact. No rash noted. No erythema. No pallor.  Psychiatric: She has a normal mood and affect. Her speech is normal and behavior is normal. Her mood appears not anxious.    ED Course  Procedures (including critical care time)  Despite telling me she had lost her appetite patient states she is hungry. Lites returned in her room so she could rest and sleep.   Results for orders placed during the hospital encounter of 10/16/13  CBC WITH DIFFERENTIAL      Result Value Ref Range   WBC 5.8  4.0 - 10.5 K/uL   RBC 4.76  3.87 - 5.11 MIL/uL   Hemoglobin 13.8  12.0 - 15.0 g/dL   HCT 38.9  36.0 - 46.0 %   MCV 81.7  78.0 - 100.0 fL   MCH 29.0  26.0 - 34.0 pg   MCHC 35.5  30.0 - 36.0 g/dL   RDW 12.6  11.5 - 15.5 %   Platelets 377  150 - 400 K/uL   Neutrophils Relative % 61  43 - 77 %   Neutro Abs 3.6  1.7 - 7.7 K/uL   Lymphocytes Relative 30  12 - 46 %   Lymphs Abs 1.7  0.7 - 4.0 K/uL   Monocytes Relative 8  3 - 12 %   Monocytes Absolute 0.5  0.1 - 1.0 K/uL   Eosinophils Relative 1  0 - 5 %   Eosinophils Absolute 0.1  0.0 - 0.7 K/uL   Basophils Relative 0  0 - 1 %   Basophils Absolute 0.0  0.0 - 0.1 K/uL  COMPREHENSIVE METABOLIC PANEL      Result Value Ref Range   Sodium 140  137 - 147 mEq/L   Potassium 3.4 (*) 3.7 - 5.3 mEq/L   Chloride 102  96 - 112 mEq/L   CO2 23  19 - 32 mEq/L   Glucose, Bld 90  70 - 99 mg/dL   BUN 6  6 - 23 mg/dL   Creatinine, Ser 0.70  0.50 - 1.10 mg/dL   Calcium 9.9  8.4 - 10.5 mg/dL   Total Protein 8.0  6.0 - 8.3 g/dL   Albumin 4.7  3.5 - 5.2 g/dL   AST 15  0 - 37 U/L   ALT 6  0 - 35 U/L   Alkaline Phosphatase 52  39 - 117 U/L   Total Bilirubin 0.6  0.3 - 1.2 mg/dL   GFR calc non Af Amer >90  >90  mL/min   GFR calc Af Amer >90  >90 mL/min  MAGNESIUM  Result Value Ref Range   Magnesium 1.9  1.5 - 2.5 mg/dL   Laboratory interpretation all normal except hypokalemia     Labs Review Results for orders placed during the hospital encounter of 10/10/13  CBC WITH DIFFERENTIAL      Result Value Ref Range   WBC 5.7  4.0 - 10.5 K/uL   RBC 4.78  3.87 - 5.11 MIL/uL   Hemoglobin 14.0  12.0 - 15.0 g/dL   HCT 39.8  36.0 - 46.0 %   MCV 83.3  78.0 - 100.0 fL   MCH 29.3  26.0 - 34.0 pg   MCHC 35.2  30.0 - 36.0 g/dL   RDW 12.5  11.5 - 15.5 %   Platelets 383  150 - 400 K/uL   Neutrophils Relative % 74  43 - 77 %   Neutro Abs 4.2  1.7 - 7.7 K/uL   Lymphocytes Relative 21  12 - 46 %   Lymphs Abs 1.2  0.7 - 4.0 K/uL   Monocytes Relative 5  3 - 12 %   Monocytes Absolute 0.3  0.1 - 1.0 K/uL   Eosinophils Relative 0  0 - 5 %   Eosinophils Absolute 0.0  0.0 - 0.7 K/uL   Basophils Relative 0  0 - 1 %   Basophils Absolute 0.0  0.0 - 0.1 K/uL  BASIC METABOLIC PANEL      Result Value Ref Range   Sodium 141  137 - 147 mEq/L   Potassium 3.4 (*) 3.7 - 5.3 mEq/L   Chloride 101  96 - 112 mEq/L   CO2 22  19 - 32 mEq/L   Glucose, Bld 85  70 - 99 mg/dL   BUN 8  6 - 23 mg/dL   Creatinine, Ser 0.73  0.50 - 1.10 mg/dL   Calcium 9.8  8.4 - 10.5 mg/dL   GFR calc non Af Amer >90  >90 mL/min   GFR calc Af Amer >90  >90 mL/min  HCG, SERUM, QUALITATIVE      Result Value Ref Range   Preg, Serum NEGATIVE  NEGATIVE  TSH      Result Value Ref Range   TSH 1.670  0.350 - 4.500 uIU/mL   Dg Chest 2 View  10/10/2013   CLINICAL DATA:  Heart palpitations  EXAM: CHEST  2 VIEW  COMPARISON:  10/05/2013  FINDINGS: The heart size and mediastinal contours are within normal limits. Both lungs are clear. The visualized skeletal structures are unremarkable.  IMPRESSION: No active cardiopulmonary disease.   Electronically Signed   By: Inez Catalina M.D.   On: 10/10/2013 17:05   Dg Chest 2 View  10/05/2013   CLINICAL  DATA:  Tachycardia  EXAM: CHEST  2 VIEW  COMPARISON:  None.  FINDINGS: The lungs are clear. The heart size and pulmonary vascularity are normal. No pneumothorax. No adenopathy. There is slight anterior wedging of a mid thoracic vertebral body, age uncertain.  IMPRESSION: No edema or consolidation.   Electronically Signed   By: Lowella Grip M.D.   On: 10/05/2013 21:52      Imaging Review No results found.   EKG Interpretation   Date/Time:  Wednesday October 16 2013 19:32:42 EDT Ventricular Rate:  98 PR Interval:  147 QRS Duration: 67 QT Interval:  340 QTC Calculation: 434 R Axis:   51 Text Interpretation:  Sinus rhythm Low voltage, precordial leads  Nonspecific T abnormalities, diffuse leads No significant change since  last tracing 10 Oct 2013 Confirmed  by KNAPP  MD-I, IVA ([id number]) on  10/16/2013 9:34:47 PM      MDM   Final diagnoses:  Anxiety  Atypical chest pain  Palpitation  Hypokalemia    New Prescriptions   POTASSIUM CHLORIDE SA (K-DUR,KLOR-CON) 20 MEQ TABLET    Take 1 tablet (20 mEq total) by mouth 2 (two) times daily.    Plan discharge   Rolland Porter, MD, Alanson Aly, MD 10/16/13 2229

## 2013-10-16 NOTE — Discharge Instructions (Signed)
Take the potassium pills until gone. A skipped beat now and then is normal. You should discuss your anxiety with a mental health professional, you can go to [facility] or someone on the referral guide. You need to get a primary care doctor to go to, keep your appointment tomorrow at the [facility].  Hypokalemia Hypokalemia means that the amount of potassium in the blood is lower than normal.Potassium is a chemical, called an electrolyte, that helps regulate the amount of fluid in the body. It also stimulates muscle contraction and helps nerves function properly.Most of the body's potassium is inside of cells, and only a very small amount is in the blood. Because the amount in the blood is so small, minor changes can be life-threatening. CAUSES  Antibiotics.  Diarrhea or vomiting.  Using laxatives too much, which can cause diarrhea.  Chronic kidney disease.  Water pills (diuretics).  Eating disorders (bulimia).  Low magnesium level.  Sweating a lot. SIGNS AND SYMPTOMS  Weakness.  Constipation.  Fatigue.  Muscle cramps.  Mental confusion.  Skipped heartbeats or irregular heartbeat (palpitations).  Tingling or numbness. DIAGNOSIS  Your health care provider can diagnose hypokalemia with blood tests. In addition to checking your potassium level, your health care provider may also check other lab tests. TREATMENT Hypokalemia can be treated with potassium supplements taken by mouth or adjustments in your current medicines. If your potassium level is very low, you may need to get potassium through a vein (IV) and be monitored in the hospital. A diet high in potassium is also helpful. Foods high in potassium are:  Nuts, such as peanuts and pistachios.  Seeds, such as sunflower seeds and pumpkin seeds.  Peas, lentils, and lima beans.  Whole grain and bran cereals and breads.  Fresh fruit and vegetables, such as apricots, avocado, bananas, cantaloupe, kiwi, oranges,  tomatoes, asparagus, and potatoes.  Orange and tomato juices.  Red meats.  Fruit yogurt. HOME CARE INSTRUCTIONS  Take all medicines as prescribed by your health care provider.  Maintain a healthy diet by including nutritious food, such as fruits, vegetables, nuts, whole grains, and lean meats.  If you are taking a laxative, be sure to follow the directions on the label. SEEK MEDICAL CARE IF:  Your weakness gets worse.  You feel your heart pounding or racing.  You are vomiting or having diarrhea.  You are diabetic and having trouble keeping your blood glucose in the normal range. SEEK IMMEDIATE MEDICAL CARE IF:  You have chest pain, shortness of breath, or dizziness.  You are vomiting or having diarrhea for more than 2 days.  You faint. MAKE SURE YOU:   Understand these instructions.  Will watch your condition.  Will get help right away if you are not doing well or get worse. Document Released: 04/18/2005 Document Revised: 02/06/2013 Document Reviewed: 10/19/2012 [facility] Patient Information 2015 [place], [place]. This information is not intended to replace advice given to you by your health care provider. Make sure you discuss any questions you have with your health care provider.

## 2013-10-17 NOTE — ED Provider Notes (Signed)
Medical screening examination/treatment/procedure(s) were performed by non-physician practitioner and as supervising physician I was immediately available for consultation/collaboration.   EKG Interpretation   Date/Time:  Thursday October 10 2013 15:22:46 EDT Ventricular Rate:  103 PR Interval:  155 QRS Duration: 68 QT Interval:  377 QTC Calculation: 493 R Axis:   61 Text Interpretation:  Sinus tachycardia Low voltage, precordial leads  Nonspecific T abnormalities, diffuse leads Borderline prolonged QT  interval ED PHYSICIAN INTERPRETATION AVAILABLE IN [facility]  Confirmed by TEST, Record ([id number]) on 10/12/2013 1:40:55 PM       Threasa Beards, MD 10/17/13 [phone]

## 2013-10-21 ENCOUNTER — Encounter (HOSPITAL_COMMUNITY): Payer: Self-pay | Admitting: Emergency Medicine

## 2013-10-21 ENCOUNTER — Emergency Department (HOSPITAL_COMMUNITY)
Admission: EM | Admit: 2013-10-21 | Discharge: 2013-10-21 | Discharge status: Against Medical Advice | Payer: BC Managed Care – PPO | Attending: Emergency Medicine | Admitting: Emergency Medicine

## 2013-10-21 DIAGNOSIS — M549 Dorsalgia, unspecified: Secondary | ICD-10-CM | POA: Insufficient documentation

## 2013-10-21 DIAGNOSIS — R42 Dizziness and giddiness: Secondary | ICD-10-CM | POA: Insufficient documentation

## 2013-10-21 DIAGNOSIS — J45909 Unspecified asthma, uncomplicated: Secondary | ICD-10-CM | POA: Insufficient documentation

## 2013-10-21 DIAGNOSIS — R079 Chest pain, unspecified: Secondary | ICD-10-CM | POA: Insufficient documentation

## 2013-10-21 LAB — BASIC METABOLIC PANEL
BUN: 5 mg/dL — AB (ref 6–23)
CALCIUM: 9.9 mg/dL (ref 8.4–10.5)
CO2: 24 mEq/L (ref 19–32)
Chloride: 102 mEq/L (ref 96–112)
Creatinine, Ser: 0.74 mg/dL (ref 0.50–1.10)
GFR calc Af Amer: >90 mL/min (ref >90)
GFR calc non Af Amer: >90 mL/min (ref >90)
Glucose, Bld: 90 mg/dL (ref 70–99)
Potassium: 4.4 mEq/L (ref 3.7–5.3)
Sodium: 139 mEq/L (ref 137–147)

## 2013-10-21 LAB — CBC
HCT: 40.8 % (ref 36.0–46.0)
Hemoglobin: 14.1 g/dL (ref 12.0–15.0)
MCH: 28.7 pg (ref 26.0–34.0)
MCHC: 34.6 g/dL (ref 30.0–36.0)
MCV: 83.1 fL (ref 78.0–100.0)
PLATELETS: 362 10*3/uL (ref 150–400)
RBC: 4.91 MIL/uL (ref 3.87–5.11)
RDW: 12.8 % (ref 11.5–15.5)
WBC: 5.9 10*3/uL (ref 4.0–10.5)

## 2013-10-21 LAB — I-STAT TROPONIN, ED: TROPONIN I, POC: 0 ng/mL (ref 0.00–0.08)

## 2013-10-21 NOTE — ED Notes (Signed)
Patient did not answer when called for lab draw. RN Caryl Pina made aware

## 2013-10-21 NOTE — ED Notes (Signed)
Pt c/o chest pain and back pain x 2 weeks. States she has low potassium but doesn't take pills due to making her sick. Pt also has hx of anxiety.

## 2013-10-21 NOTE — ED Notes (Signed)
Called for lab. NO RESPONSE. UNABLE TO FIND PT.

## 2013-10-21 NOTE — ED Provider Notes (Signed)
ECG interpretation   Date: 10/21/2013  Rate: 96  Rhythm: normal sinus rhythm  QRS Axis: normal  Intervals: normal  ST/T Wave abnormalities: normal  Conduction Disutrbances: none  Narrative Interpretation:   Old EKG Reviewed: No significant changes noted     Hoy Morn, MD 10/21/13 2336

## 2013-10-21 NOTE — ED Notes (Signed)
WITH LAB

## 2013-10-22 ENCOUNTER — Emergency Department (HOSPITAL_COMMUNITY)
Admission: EM | Admit: 2013-10-22 | Discharge: 2013-10-22 | Disposition: A | Discharge status: Home / Self Care | Payer: BC Managed Care – PPO | Attending: Emergency Medicine | Admitting: Emergency Medicine

## 2013-10-22 ENCOUNTER — Encounter (HOSPITAL_COMMUNITY): Payer: Self-pay | Admitting: Emergency Medicine

## 2013-10-22 DIAGNOSIS — Z3202 Encounter for pregnancy test, result negative: Secondary | ICD-10-CM | POA: Insufficient documentation

## 2013-10-22 DIAGNOSIS — H6120 Impacted cerumen, unspecified ear: Secondary | ICD-10-CM | POA: Insufficient documentation

## 2013-10-22 DIAGNOSIS — F419 Anxiety disorder, unspecified: Secondary | ICD-10-CM

## 2013-10-22 DIAGNOSIS — J45909 Unspecified asthma, uncomplicated: Secondary | ICD-10-CM | POA: Insufficient documentation

## 2013-10-22 DIAGNOSIS — H6121 Impacted cerumen, right ear: Secondary | ICD-10-CM

## 2013-10-22 DIAGNOSIS — F411 Generalized anxiety disorder: Secondary | ICD-10-CM | POA: Insufficient documentation

## 2013-10-22 DIAGNOSIS — Z79899 Other long term (current) drug therapy: Secondary | ICD-10-CM | POA: Insufficient documentation

## 2013-10-22 DIAGNOSIS — R51 Headache: Secondary | ICD-10-CM | POA: Insufficient documentation

## 2013-10-22 LAB — BASIC METABOLIC PANEL
BUN: 5 mg/dL — AB (ref 6–23)
CALCIUM: 10.2 mg/dL (ref 8.4–10.5)
CO2: 20 meq/L (ref 19–32)
Chloride: 100 mEq/L (ref 96–112)
Creatinine, Ser: 0.66 mg/dL (ref 0.50–1.10)
GFR calc Af Amer: >90 mL/min (ref >90)
Glucose, Bld: 81 mg/dL (ref 70–99)
Potassium: 3.8 mEq/L (ref 3.7–5.3)
Sodium: 141 mEq/L (ref 137–147)

## 2013-10-22 LAB — CBC
HCT: 42.2 % (ref 36.0–46.0)
HEMOGLOBIN: 14.5 g/dL (ref 12.0–15.0)
MCH: 29.1 pg (ref 26.0–34.0)
MCHC: 34.4 g/dL (ref 30.0–36.0)
MCV: 84.7 fL (ref 78.0–100.0)
PLATELETS: 380 10*3/uL (ref 150–400)
RBC: 4.98 MIL/uL (ref 3.87–5.11)
RDW: 12.8 % (ref 11.5–15.5)
WBC: 7 10*3/uL (ref 4.0–10.5)

## 2013-10-22 LAB — URINALYSIS, ROUTINE W REFLEX MICROSCOPIC
BILIRUBIN URINE: NEGATIVE
GLUCOSE, UA: NEGATIVE mg/dL
Hgb urine dipstick: NEGATIVE
Ketones, ur: 15 mg/dL — AB
LEUKOCYTES UA: NEGATIVE
Nitrite: NEGATIVE
PROTEIN: NEGATIVE mg/dL
Specific Gravity, Urine: 1.005 (ref 1.005–1.030)
Urobilinogen, UA: 0.2 mg/dL (ref 0.0–1.0)
pH: 6.5 (ref 5.0–8.0)

## 2013-10-22 LAB — POC URINE PREG, ED: Preg Test, Ur: NEGATIVE

## 2013-10-22 NOTE — Discharge Instructions (Signed)
Use warm water, mixed with peroxide, half strength, to flush your ear, daily until improved   Cerumen Impaction A cerumen impaction is when the wax in your ear forms a plug. This plug usually causes reduced hearing. Sometimes it also causes an earache or dizziness. Removing a cerumen impaction can be difficult and painful. The wax sticks to the ear canal. The canal is sensitive and bleeds easily. If you try to remove a heavy wax buildup with a cotton tipped swab, you may push it in further. Irrigation with water, suction, and small ear curettes may be used to clear out the wax. If the impaction is fixed to the skin in the ear canal, ear drops may be needed for a few days to loosen the wax. People who build up a lot of wax frequently can use ear wax removal products available in your local drugstore. SEEK MEDICAL CARE IF:  You develop an earache, increased hearing loss, or marked dizziness. Document Released: 05/26/2004 Document Revised: 07/11/2011 Document Reviewed: 07/16/2009 [facility] Patient Information 2015 [place], [place]. This information is not intended to replace advice given to you by your health care provider. Make sure you discuss any questions you have with your health care provider.

## 2013-10-22 NOTE — ED Notes (Signed)
Pt with multiple complaints states that she has been evaluated several times in last few weeks.  Pt states she is "not feeling well". Not sleeping well.  Not eating well.  Generalized fatigue, back pain and headache.

## 2013-10-22 NOTE — ED Notes (Signed)
Pt now states chest pain, shoulder pain and rt ear pain.

## 2013-10-23 NOTE — ED Provider Notes (Signed)
CSN: [id number]     Arrival date & time 10/22/13  1217 History   First MD Initiated Contact with Patient 10/22/13 1450     Chief Complaint  Patient presents with  . Fatigue  . Headache  . Anxiety     (Consider location/radiation/quality/duration/timing/severity/associated sxs/prior Treatment) HPI  Wanda Nixon is a 23 y.o. female with c/o anxiety, achiness, chest pain and ill-ease. Seen here several times for same recently. Wanda Nixon saw her PCP and was given Xanax for Anxiety, but it only made her sleepy and did not help the anxiety. No fever, cough, vomiting or weakness. There are no other known modifying factors.  Past Medical History  Diagnosis Date  . Asthma   . Anxiety    Past Surgical History  Procedure Laterality Date  . Mouth surgery    . Wisdom tooth extraction     History reviewed. No pertinent family history. History  Substance Use Topics  . Smoking status: Never Smoker   . Smokeless tobacco: Not on file  . Alcohol Use: No   OB History   Grav Para Term Preterm Abortions TAB SAB Ect Mult Living                 Review of Systems  All other systems reviewed and are negative.     Allergies  Pulmicort and Tomato  Home Medications   Prior to Admission medications   Medication Sig Start Date End Date Taking? Authorizing Provider  acetaminophen (TYLENOL) 500 MG tablet Take 500 mg by mouth every 6 (six) hours as needed for mild pain.   Yes Historical Provider, MD  albuterol (PROVENTIL HFA;VENTOLIN HFA) 108 (90 BASE) MCG/ACT inhaler Inhale 2 puffs into the lungs every 6 (six) hours as needed for wheezing or shortness of breath.   Yes Historical Provider, MD  ALPRAZolam Duanne Moron) 0.25 MG tablet Take 0.25 mg by mouth at bedtime as needed for anxiety.   Yes Historical Provider, MD  hydrOXYzine (ATARAX/VISTARIL) 25 MG tablet Take 25 mg by mouth 3 (three) times daily as needed for anxiety.   Yes Historical Provider, MD  loperamide (IMODIUM) 1 MG/5ML solution  Take 2 mg by mouth as needed for diarrhea or loose stools.   Yes Historical Provider, MD  LORazepam (ATIVAN) 0.5 MG tablet Take 1 tablet (0.5 mg total) by mouth 3 (three) times daily as needed for anxiety. 10/05/13  Yes Tiffany Marilu Favre, PA-C  Multiple Vitamins-Minerals (MULTIVITAMIN WITH MINERALS) tablet Take 1 tablet by mouth daily.   Yes Historical Provider, MD  potassium chloride SA (K-DUR,KLOR-CON) 20 MEQ tablet Take 1 tablet (20 mEq total) by mouth 2 (two) times daily. 10/16/13  Yes Janice Norrie, MD   BP 123/82  Pulse 100  Temp(Src) 98 F (36.7 C) (Oral)  Resp 18  SpO2 100%  LMP 10/05/2013 Physical Exam  Nursing note and vitals reviewed. Constitutional: Wanda Nixon is oriented to person, place, and time. Wanda Nixon appears well-developed and well-nourished.  HENT:  Head: Normocephalic and atraumatic.  Right cerumen impaction  Eyes: Conjunctivae and EOM are normal. Pupils are equal, round, and reactive to light.  Neck: Normal range of motion and phonation normal. Neck supple.  Cardiovascular: Normal rate, regular rhythm and intact distal pulses.   Pulmonary/Chest: Effort normal and breath sounds normal. Wanda Nixon exhibits no tenderness.  Abdominal: Soft. Wanda Nixon exhibits no distension. There is no tenderness. There is no guarding.  Musculoskeletal: Normal range of motion.  Neurological: Wanda Nixon is alert and oriented to person, place, and time. Wanda Nixon exhibits  normal muscle tone.  Skin: Skin is warm and dry.  Psychiatric: Wanda Nixon has a normal mood and affect. Her behavior is normal. Judgment and thought content normal.    ED Course  Procedures (including critical care time) Labs Review Labs Reviewed  BASIC METABOLIC PANEL - Abnormal; Notable for the following:    BUN 5 (*)    All other components within normal limits  URINALYSIS, ROUTINE W REFLEX MICROSCOPIC - Abnormal; Notable for the following:    Ketones, ur 15 (*)    All other components within normal limits  CBC  POC URINE PREG, ED    Imaging Review No  results found.   EKG Interpretation None      MDM   Final diagnoses:  Anxiety  Cerumen impaction, right    Benign c/o and exam, and recent evaluations. No indication for additional ED treatment.  Nursing Notes Reviewed/ Care Coordinated Applicable Imaging Reviewed Interpretation of Laboratory Data incorporated into ED treatment  The patient appears reasonably screened and/or stabilized for discharge and I doubt any other medical condition or other [facility] requiring further screening, evaluation, or treatment in the ED at this time prior to discharge.  Plan: Home Medications- no change; Home Treatments- rest; return here if the recommended treatment, does not improve the symptoms; Recommended follow up- Consider counseling, PCP prn    Richarda Blade, MD 10/23/13 [phone]

## 2013-10-25 ENCOUNTER — Ambulatory Visit (INDEPENDENT_AMBULATORY_CARE_PROVIDER_SITE_OTHER): Payer: BC Managed Care – PPO | Admitting: Neurology

## 2013-10-25 ENCOUNTER — Encounter: Payer: Self-pay | Admitting: Neurology

## 2013-10-25 VITALS — BP 115/85 | HR 97 | Ht 59.0 in | Wt 117.0 lb

## 2013-10-25 DIAGNOSIS — G43909 Migraine, unspecified, not intractable, without status migrainosus: Secondary | ICD-10-CM | POA: Insufficient documentation

## 2013-10-25 MED ORDER — RIZATRIPTAN BENZOATE 5 MG PO TBDP: 5.0000 mg | ORAL_TABLET | ORAL | Status: DC | PRN

## 2013-10-25 NOTE — Progress Notes (Signed)
PATIENT: Wanda Nixon DOB: 1990/11/12  HISTORICAL  Wanda Nixon is a 23 years old right-handed female, referred by her primary care physician for evaluation of headaches,  She reported a history of headaches since high school, bifrontal pressure headaches, getting worse in the past few months, also become right frontal, right retro-orbital area severe pounding headaches, sometimes with light sensitivity, lasting for a few hours, Tylenol is helpful  She has couple headaches each week, trigger for her headaches are fatigue, sleep deprivation  She is taking Xanax as needed for anxiety, she denies visual change, no lateralized motor or sensory deficit  REVIEW OF SYSTEMS: Full 14 system review of systems performed and notable only for palpation, trouble swallowing, headaches, dizziness, anxiety, not enough sleep, change in appetite, racing thoughts, increased thirst,  ALLERGIES: Allergies  Allergen Reactions  . Pulmicort [Budesonide] Other (See Comments)    Racing heart  . Tomato Hives and Itching    HOME MEDICATIONS: Current Outpatient Prescriptions on File Prior to Visit  Medication Sig Dispense Refill  . acetaminophen (TYLENOL) 500 MG tablet Take 500 mg by mouth every 6 (six) hours as needed for mild pain.      Marland Kitchen albuterol (PROVENTIL HFA;VENTOLIN HFA) 108 (90 BASE) MCG/ACT inhaler Inhale 2 puffs into the lungs every 6 (six) hours as needed for wheezing or shortness of breath.      . ALPRAZolam (XANAX) 0.25 MG tablet Take 0.25 mg by mouth at bedtime as needed for anxiety.      . hydrOXYzine (ATARAX/VISTARIL) 25 MG tablet Take 25 mg by mouth 3 (three) times daily as needed for anxiety.      Marland Kitchen loperamide (IMODIUM) 1 MG/5ML solution Take 2 mg by mouth as needed for diarrhea or loose stools.      Marland Kitchen LORazepam (ATIVAN) 0.5 MG tablet Take 1 tablet (0.5 mg total) by mouth 3 (three) times daily as needed for anxiety.  10 tablet  0  . Multiple Vitamins-Minerals  (MULTIVITAMIN WITH MINERALS) tablet Take 1 tablet by mouth daily.      . potassium chloride SA (K-DUR,KLOR-CON) 20 MEQ tablet Take 1 tablet (20 mEq total) by mouth 2 (two) times daily.  8 tablet  0   No current facility-administered medications on file prior to visit.    PAST MEDICAL HISTORY: Past Medical History  Diagnosis Date  . Asthma   . Anxiety     PAST SURGICAL HISTORY: Past Surgical History  Procedure Laterality Date  . Mouth surgery    . Wisdom tooth extraction      FAMILY HISTORY: Family History  Problem Relation Age of Onset  . Diabetes Maternal Grandmother   . High blood pressure Mother     SOCIAL HISTORY:  History   Social History  . Marital Status: Single    Spouse Name: N/A    Number of Children: 0  . Years of Education: college   Occupational History  .      [employer] - Part time   Social History Main Topics  . Smoking status: Never Smoker   . Smokeless tobacco: Never Used  . Alcohol Use: No  . Drug Use: No  . Sexual Activity: Yes    Birth Control/ Protection: None   Other Topics Concern  . Not on file   Social History Narrative   Patient lives at home with her fiance. Patient works at [employer] part time.   Education two years of college.   Right handed.   Caffeine - None  PHYSICAL EXAM   Filed Vitals:   10/25/13 1032  BP: 115/85  Pulse: 97  Height: 4\' 11"  (1.499 m)  Weight: 117 lb (53.071 kg)    Not recorded    Body mass index is 23.62 kg/(m^2).   Generalized: In no acute distress  Neck: Supple, no carotid bruits   Cardiac: Regular rate rhythm  Pulmonary: Clear to auscultation bilaterally  Musculoskeletal: No deformity  Neurological examination  Mentation: Alert oriented to time, place, history taking, and causual conversation  Cranial nerve II-XII: Pupils were equal round reactive to light. Extraocular movements were full.  Visual field were full on confrontational test. Bilateral fundi were sharp.  Facial  sensation and strength were normal. Hearing was intact to finger rubbing bilaterally. Uvula tongue midline.  Head turning and shoulder shrug and were normal and symmetric.Tongue protrusion into cheek strength was normal.  Motor: Normal tone, bulk and strength.  Sensory: Intact to fine touch, pinprick, preserved vibratory sensation, and proprioception at toes.  Coordination: Normal finger to nose, heel-to-shin bilaterally there was no truncal ataxia  Gait: Rising up from seated position without assistance, normal stance, without trunk ataxia, moderate stride, good arm swing, smooth turning, able to perform tiptoe, and heel walking without difficulty.   Romberg signs: Negative  Deep tendon reflexes: Brachioradialis 2/2, biceps 2/2, triceps 2/2, patellar 2/2, Achilles 2/2, plantar responses were flexor bilaterally.   DIAGNOSTIC DATA (LABS, IMAGING, TESTING) - I reviewed patient records, labs, notes, testing and imaging myself where available.  Lab Results  Component Value Date   WBC 7.0 10/22/2013   HGB 14.5 10/22/2013   HCT 42.2 10/22/2013   MCV 84.7 10/22/2013   PLT 380 10/22/2013      Component Value Date/Time   NA 141 10/22/2013 1408   K 3.8 10/22/2013 1408   CL 100 10/22/2013 1408   CO2 20 10/22/2013 1408   GLUCOSE 81 10/22/2013 1408   BUN 5* 10/22/2013 1408   CREATININE 0.66 10/22/2013 1408   CALCIUM 10.2 10/22/2013 1408   PROT 8.0 10/16/2013 2129   ALBUMIN 4.7 10/16/2013 2129   AST 15 10/16/2013 2129   ALT 6 10/16/2013 2129   ALKPHOS 52 10/16/2013 2129   BILITOT 0.6 10/16/2013 2129   GFRNONAA >90 10/22/2013 1408   GFRAA >90 10/22/2013 1408   No results found for this basename: CHOL, HDL, LDLCALC, LDLDIRECT, TRIG, CHOLHDL   No results found for this basename: HGBA1C   No results found for this basename: VITAMINB12   Lab Results  Component Value Date   TSH 1.670 10/10/2013    ASSESSMENT AND PLAN  Paw Bjorn Loser is a 23 y.o. female complains of frequent headaches, with  some migraine features,  Essentially normal neurological examination Maxalt as needed, I also suggested that she may try over-the-counter ibuprofen, aspirin, Excedrin migraine   return to clinic in 6 months with Rhae Hammock, M.D. Ph.D.  [facility] [address], [place], [place] [address] [phone]

## 2013-10-28 ENCOUNTER — Telehealth: Payer: Self-pay | Admitting: Neurology

## 2013-10-28 DIAGNOSIS — G43909 Migraine, unspecified, not intractable, without status migrainosus: Secondary | ICD-10-CM

## 2013-10-28 NOTE — Telephone Encounter (Signed)
Patient will hold off on MRI for a while. Please see phone note

## 2013-10-28 NOTE — Telephone Encounter (Signed)
Spoke with patient and she is requesting an MRI to make sure everything is ok since she is still having head pain and sensitivity to light.  She stated that she is scheduled for a visit with PCP today at 2:30 and will call back to update.

## 2013-10-28 NOTE — Telephone Encounter (Signed)
Patient called and stated she was going to hold off on MRI for a while she spoke to her PCP and she is going to wait for a while.

## 2013-10-28 NOTE — Telephone Encounter (Signed)
Wanda Nixon:  I have ordered MRI brain.  Please let her know.

## 2013-10-28 NOTE — Telephone Encounter (Signed)
Called patient and let her no that Dr.Yan has order MRI of the brain and it will have to go threw insurance process first.

## 2013-10-28 NOTE — Telephone Encounter (Signed)
Patient calling to state that she is still having pains on the side of her head and wants to know if it's possible to be seen today so that an MRI can get ordered for her since she is off today. Please return call to patient and advise.

## 2013-11-03 ENCOUNTER — Emergency Department (HOSPITAL_COMMUNITY)
Admission: EM | Admit: 2013-11-03 | Discharge: 2013-11-03 | Disposition: A | Discharge status: Home / Self Care | Payer: BC Managed Care – PPO | Attending: Emergency Medicine | Admitting: Emergency Medicine

## 2013-11-03 ENCOUNTER — Encounter (HOSPITAL_COMMUNITY): Payer: Self-pay | Admitting: Emergency Medicine

## 2013-11-03 ENCOUNTER — Emergency Department (HOSPITAL_COMMUNITY): Payer: BC Managed Care – PPO

## 2013-11-03 DIAGNOSIS — J069 Acute upper respiratory infection, unspecified: Secondary | ICD-10-CM | POA: Insufficient documentation

## 2013-11-03 DIAGNOSIS — B349 Viral infection, unspecified: Secondary | ICD-10-CM

## 2013-11-03 DIAGNOSIS — B9789 Other viral agents as the cause of diseases classified elsewhere: Secondary | ICD-10-CM | POA: Insufficient documentation

## 2013-11-03 DIAGNOSIS — F411 Generalized anxiety disorder: Secondary | ICD-10-CM | POA: Insufficient documentation

## 2013-11-03 DIAGNOSIS — J45901 Unspecified asthma with (acute) exacerbation: Secondary | ICD-10-CM | POA: Insufficient documentation

## 2013-11-03 DIAGNOSIS — Z3202 Encounter for pregnancy test, result negative: Secondary | ICD-10-CM | POA: Insufficient documentation

## 2013-11-03 DIAGNOSIS — Z79899 Other long term (current) drug therapy: Secondary | ICD-10-CM | POA: Insufficient documentation

## 2013-11-03 DIAGNOSIS — J0100 Acute maxillary sinusitis, unspecified: Secondary | ICD-10-CM | POA: Insufficient documentation

## 2013-11-03 LAB — CBC WITH DIFFERENTIAL/PLATELET
BASOS ABS: 0 10*3/uL (ref 0.0–0.1)
Basophils Relative: 0 % (ref 0–1)
Eosinophils Absolute: 0 10*3/uL (ref 0.0–0.7)
Eosinophils Relative: 1 % (ref 0–5)
HEMATOCRIT: 37.6 % (ref 36.0–46.0)
Hemoglobin: 13.2 g/dL (ref 12.0–15.0)
Lymphocytes Relative: 21 % (ref 12–46)
Lymphs Abs: 1.1 10*3/uL (ref 0.7–4.0)
MCH: 29.4 pg (ref 26.0–34.0)
MCHC: 35.1 g/dL (ref 30.0–36.0)
MCV: 83.7 fL (ref 78.0–100.0)
MONO ABS: 0.3 10*3/uL (ref 0.1–1.0)
Monocytes Relative: 7 % (ref 3–12)
NEUTROS ABS: 3.6 10*3/uL (ref 1.7–7.7)
NEUTROS PCT: 71 % (ref 43–77)
PLATELETS: 296 10*3/uL (ref 150–400)
RBC: 4.49 MIL/uL (ref 3.87–5.11)
RDW: 13.2 % (ref 11.5–15.5)
WBC: 5.1 10*3/uL (ref 4.0–10.5)

## 2013-11-03 LAB — COMPREHENSIVE METABOLIC PANEL
ALT: 7 U/L (ref 0–35)
AST: 15 U/L (ref 0–37)
Albumin: 4.8 g/dL (ref 3.5–5.2)
Alkaline Phosphatase: 43 U/L (ref 39–117)
Anion gap: 15 (ref 5–15)
BILIRUBIN TOTAL: 0.6 mg/dL (ref 0.3–1.2)
BUN: 5 mg/dL — ABNORMAL LOW (ref 6–23)
CHLORIDE: 102 meq/L (ref 96–112)
CO2: 24 mEq/L (ref 19–32)
Calcium: 9.7 mg/dL (ref 8.4–10.5)
Creatinine, Ser: 0.59 mg/dL (ref 0.50–1.10)
GFR calc Af Amer: >90 mL/min (ref >90)
Glucose, Bld: 93 mg/dL (ref 70–99)
Potassium: 3.9 mEq/L (ref 3.7–5.3)
SODIUM: 141 meq/L (ref 137–147)
Total Protein: 7.9 g/dL (ref 6.0–8.3)

## 2013-11-03 LAB — URINALYSIS, ROUTINE W REFLEX MICROSCOPIC
BILIRUBIN URINE: NEGATIVE
Glucose, UA: NEGATIVE mg/dL
KETONES UR: NEGATIVE mg/dL
Nitrite: NEGATIVE
PH: 7 (ref 5.0–8.0)
Protein, ur: NEGATIVE mg/dL
SPECIFIC GRAVITY, URINE: 1.006 (ref 1.005–1.030)
Urobilinogen, UA: 0.2 mg/dL (ref 0.0–1.0)

## 2013-11-03 LAB — RAPID STREP SCREEN (MED CTR MEBANE ONLY): Streptococcus, Group A Screen (Direct): NEGATIVE

## 2013-11-03 LAB — TROPONIN I: Troponin I: <0.3 ng/mL (ref <0.30)

## 2013-11-03 LAB — URINE MICROSCOPIC-ADD ON

## 2013-11-03 LAB — D-DIMER, QUANTITATIVE (NOT AT ARMC): D DIMER QUANT: 0.32 ug{FEU}/mL (ref 0.00–0.48)

## 2013-11-03 LAB — POC URINE PREG, ED: Preg Test, Ur: NEGATIVE

## 2013-11-03 MED ORDER — SODIUM CHLORIDE 0.9 % IV SOLN
INTRAVENOUS | Status: DC
  Administered 2013-11-03: 16:00:00 via INTRAVENOUS

## 2013-11-03 MED ORDER — ALBUTEROL SULFATE HFA 108 (90 BASE) MCG/ACT IN AERS
2.0000 | INHALATION_SPRAY | Freq: Once | RESPIRATORY_TRACT | Status: AC
  Administered 2013-11-03: 2 via RESPIRATORY_TRACT
  Filled 2013-11-03: qty 6.7

## 2013-11-03 NOTE — ED Notes (Signed)
Pt c/o center chest tightness onset today. Pt reports tightness is intermittent. Pt also reports shortness of breath, reports unsure if its her asthma. Pt c/o dryness in her throat. Pt has been taking ibuprofen for sinus pain.

## 2013-11-03 NOTE — ED Provider Notes (Signed)
CSN: [id number]     Arrival date & time 11/03/13  1401 History   First MD Initiated Contact with Patient 11/03/13 1510     Chief Complaint  Patient presents with  . Chest Pain     (Consider location/radiation/quality/duration/timing/severity/associated sxs/prior Treatment) The history is provided by the patient. No language interpreter was used.  Wanda Nixon is a 23 y/o F with PMHx of asthma and anxiety presenting to the ED with chest pain, shortness of breath, sore throat and dry throat that started a couple of days ago. Patient reported that she was seen and assessed by her PCP who diagnosed her with sinusitis and started on Augmentin. Reported that she has been having chest pain localized to the center of her chest described as a tightness. Reported that when she takes a deep breath in it does not feel like the breath is getting deep into her lungs. Patient reported that she has been having shortness of breath. Stated that when she coughs she coughs to catch her breath. Reported that her nostrils have been feeling swollen. Denied smoking, birth control, traveling for long periods of time, fevers, chills, neck pain, neck stiffness, sudden loss of vision, nausea, vomiting, leg swelling. PCP Dr. Harle Battiest   Past Medical History  Diagnosis Date  . Asthma   . Anxiety    Past Surgical History  Procedure Laterality Date  . Mouth surgery    . Wisdom tooth extraction     Family History  Problem Relation Age of Onset  . Diabetes Maternal Grandmother   . High blood pressure Mother    History  Substance Use Topics  . Smoking status: Never Smoker   . Smokeless tobacco: Never Used  . Alcohol Use: No   OB History   Grav Para Term Preterm Abortions TAB SAB Ect Mult Living                 Review of Systems  Constitutional: Negative for fever and chills.  HENT: Positive for sore throat.   Eyes: Negative for visual disturbance.  Respiratory: Positive for chest tightness and  shortness of breath.   Cardiovascular: Positive for chest pain. Negative for leg swelling.  Gastrointestinal: Negative for nausea, vomiting, abdominal pain and diarrhea.  Musculoskeletal: Negative for back pain and neck pain.  Neurological: Negative for weakness and headaches.      Allergies  Pulmicort and Tomato  Home Medications   Prior to Admission medications   Medication Sig Start Date End Date Taking? Authorizing Provider  albuterol (PROVENTIL HFA;VENTOLIN HFA) 108 (90 BASE) MCG/ACT inhaler Inhale 2 puffs into the lungs every 6 (six) hours as needed for wheezing or shortness of breath.   Yes Historical Provider, MD  amoxicillin-clavulanate (AUGMENTIN) 875-125 MG per tablet Take 1 tablet by mouth 2 (two) times daily.   Yes Historical Provider, MD  hydrOXYzine (ATARAX/VISTARIL) 25 MG tablet Take 25 mg by mouth 3 (three) times daily as needed for itching.    Yes Historical Provider, MD  ibuprofen (ADVIL,MOTRIN) 200 MG tablet Take 400 mg by mouth every 6 (six) hours as needed for headache.   Yes Historical Provider, MD  Multiple Vitamins-Minerals (MULTIVITAMIN WITH MINERALS) tablet Take 1 tablet by mouth daily.   Yes Historical Provider, MD  naproxen sodium (ANAPROX) 220 MG tablet Take 220 mg by mouth daily as needed (for pain).    Yes Historical Provider, MD   BP 127/93  Pulse 97  Temp(Src) 98 F (36.7 C) (Oral)  Resp 20  Ht  4\' 11"  (1.499 m)  Wt 114 lb (51.71 kg)  BMI 23.01 kg/m2  SpO2 100%  LMP 11/03/2013 Physical Exam  Nursing note and vitals reviewed. Constitutional: She is oriented to person, place, and time. She appears well-developed and well-nourished. No distress.  HENT:  Head: Normocephalic and atraumatic.  Mouth/Throat: Oropharynx is clear and moist. No oropharyngeal exudate.  Negative swelling, erythema, inflammation, lesions, sores, deformities noted to the tonsils and oropharynx. Uvula midline with symmetrical elevation. Negative deviation. Negative uvula  swelling. Negative trismus.   Eyes: Conjunctivae and EOM are normal. Pupils are equal, round, and reactive to light. Right eye exhibits no discharge. Left eye exhibits no discharge.  Neck: Normal range of motion. Neck supple. No tracheal deviation present.  Negative neck stiffness Negative nuchal rigidity Negative cervical lymphadenopathy  Negative meningeal   Cardiovascular: Normal rate, regular rhythm and normal heart sounds.  Exam reveals no friction rub.   No murmur heard. Cap refill < 3 seconds Negative swelling or pitting edema noted to the lower extremities bilaterally   Pulmonary/Chest: Effort normal and breath sounds normal. No respiratory distress. She has no wheezes. She has no rales.  Musculoskeletal: Normal range of motion.  Full ROM to upper and lower extremities without difficulty noted, negative ataxia noted.  Lymphadenopathy:    She has no cervical adenopathy.  Neurological: She is alert and oriented to person, place, and time. No cranial nerve deficit. She exhibits normal muscle tone. Coordination normal.  Cranial nerves III-XII grossly intact Strength 5+/5+ to upper and lower extremities bilaterally with resistance applied, equal distribution noted Equal grip strength bilaterally  Negative facial drooping Negative slurred speech  Negative aphasia   Skin: Skin is warm and dry. No rash noted. She is not diaphoretic. No erythema.  Psychiatric: She has a normal mood and affect. Her behavior is normal. Thought content normal.    ED Course  Procedures (including critical care time)  Results for orders placed during the hospital encounter of 11/03/13  RAPID STREP SCREEN      Result Value Ref Range   Streptococcus, Group A Screen (Direct) NEGATIVE  NEGATIVE  CBC WITH DIFFERENTIAL      Result Value Ref Range   WBC 5.1  4.0 - 10.5 K/uL   RBC 4.49  3.87 - 5.11 MIL/uL   Hemoglobin 13.2  12.0 - 15.0 g/dL   HCT 37.6  36.0 - 46.0 %   MCV 83.7  78.0 - 100.0 fL   MCH 29.4   26.0 - 34.0 pg   MCHC 35.1  30.0 - 36.0 g/dL   RDW 13.2  11.5 - 15.5 %   Platelets 296  150 - 400 K/uL   Neutrophils Relative % 71  43 - 77 %   Neutro Abs 3.6  1.7 - 7.7 K/uL   Lymphocytes Relative 21  12 - 46 %   Lymphs Abs 1.1  0.7 - 4.0 K/uL   Monocytes Relative 7  3 - 12 %   Monocytes Absolute 0.3  0.1 - 1.0 K/uL   Eosinophils Relative 1  0 - 5 %   Eosinophils Absolute 0.0  0.0 - 0.7 K/uL   Basophils Relative 0  0 - 1 %   Basophils Absolute 0.0  0.0 - 0.1 K/uL  COMPREHENSIVE METABOLIC PANEL      Result Value Ref Range   Sodium 141  137 - 147 mEq/L   Potassium 3.9  3.7 - 5.3 mEq/L   Chloride 102  96 - 112 mEq/L  CO2 24  19 - 32 mEq/L   Glucose, Bld 93  70 - 99 mg/dL   BUN 5 (*) 6 - 23 mg/dL   Creatinine, Ser 0.59  0.50 - 1.10 mg/dL   Calcium 9.7  8.4 - 10.5 mg/dL   Total Protein 7.9  6.0 - 8.3 g/dL   Albumin 4.8  3.5 - 5.2 g/dL   AST 15  0 - 37 U/L   ALT 7  0 - 35 U/L   Alkaline Phosphatase 43  39 - 117 U/L   Total Bilirubin 0.6  0.3 - 1.2 mg/dL   GFR calc non Af Amer >90  >90 mL/min   GFR calc Af Amer >90  >90 mL/min   Anion gap 15  5 - 15  URINALYSIS, ROUTINE W REFLEX MICROSCOPIC      Result Value Ref Range   Color, Urine YELLOW  YELLOW   APPearance CLOUDY (*) CLEAR   Specific Gravity, Urine 1.006  1.005 - 1.030   pH 7.0  5.0 - 8.0   Glucose, UA NEGATIVE  NEGATIVE mg/dL   Hgb urine dipstick LARGE (*) NEGATIVE   Bilirubin Urine NEGATIVE  NEGATIVE   Ketones, ur NEGATIVE  NEGATIVE mg/dL   Protein, ur NEGATIVE  NEGATIVE mg/dL   Urobilinogen, UA 0.2  0.0 - 1.0 mg/dL   Nitrite NEGATIVE  NEGATIVE   Leukocytes, UA TRACE (*) NEGATIVE  TROPONIN I      Result Value Ref Range   Troponin I <0.30  <0.30 ng/mL  D-DIMER, QUANTITATIVE      Result Value Ref Range   D-Dimer, Quant 0.32  0.00 - 0.48 ug/mL-FEU  URINE MICROSCOPIC-ADD ON      Result Value Ref Range   Squamous Epithelial / LPF FEW (*) RARE   WBC, UA 0-2  <3 WBC/hpf  POC URINE PREG, ED      Result Value Ref  Range   Preg Test, Ur NEGATIVE  NEGATIVE    Labs Review Labs Reviewed  COMPREHENSIVE METABOLIC PANEL - Abnormal; Notable for the following:    BUN 5 (*)    All other components within normal limits  URINALYSIS, ROUTINE W REFLEX MICROSCOPIC - Abnormal; Notable for the following:    APPearance CLOUDY (*)    Hgb urine dipstick LARGE (*)    Leukocytes, UA TRACE (*)    All other components within normal limits  URINE MICROSCOPIC-ADD ON - Abnormal; Notable for the following:    Squamous Epithelial / LPF FEW (*)    All other components within normal limits  RAPID STREP SCREEN  CULTURE, GROUP A STREP  CBC WITH DIFFERENTIAL  TROPONIN I  D-DIMER, QUANTITATIVE  POC URINE PREG, ED    Imaging Review Dg Chest 2 View  11/03/2013   CLINICAL DATA:  Sinus congestion.  EXAM: CHEST  2 VIEW  COMPARISON:  10/10/2013.  FINDINGS: The heart size and mediastinal contours are within normal limits. Both lungs are clear. The visualized skeletal structures are unremarkable.  IMPRESSION: No acute cardiopulmonary findings.   Electronically Signed   By: Kalman Jewels M.D.   On: 11/03/2013 16:56     EKG Interpretation   Date/Time:  Sunday November 03 2013 14:08:35 EDT Ventricular Rate:  96 PR Interval:  152 QRS Duration: 64 QT Interval:  318 QTC Calculation: 401 R Axis:   54 Text Interpretation:  Normal sinus rhythm with sinus arrhythmia  Nonspecific T wave abnormality Abnormal ECG Sinus rhythm Sinus arrhythmia  Artifact T wave abnormality Abnormal ekg Confirmed  by Carmin Muskrat  MD  [phone]) on 11/03/2013 5:29:37 PM      MDM   Final diagnoses:  URI (upper respiratory infection)  Viral syndrome  Acute maxillary sinusitis, recurrence not specified    Medications  0.9 %  sodium chloride infusion ( Intravenous Stopped 11/03/13 1820)   Filed Vitals:   11/03/13 1630 11/03/13 1700 11/03/13 1800 11/03/13 1815  BP: 107/72 109/75  127/93  Pulse:  91 97   Temp:      TempSrc:      Resp:  20 20    Height:      Weight:      SpO2:  100% 100%    EKG noted normal sinus rhythm with a heart rate of 96 beats per minutes-nonspecific T wave abnormality noted.. Troponin negative elevation. D-dimer negative elevation. CBC negative elevated white blood cell count-negative left shift or leukocytosis noted. CMP negative findings-kidney and liver functioning well. Anion gap of 15. Urine pregnancy negative. Urinalysis noted large hemoglobin with trace of leukocytes-no pyuria identified, white blood cells of 0-2. Rapid strep negative. Chest x-ray negative for acute cardiac pulmonary disease. Patient given IV fluids while in ED setting. Patient reports she's feeling better. Doubt PE. Doubt pneumonia. Doubt asthma attack. Doubt pneumothorax. Doubt cardiac issue - HEART score 1. Suspicion to be viral upper respiratory infection-patient was just started on Augmentin for sinus infection. Patient stable, afebrile. Patient not septic appearing. Negative hypoxia. Negative signs of respiratory distress. Discharged patient. Discharged with albuterol inhaler to be used for home as needed for chest tightness. Discussed with patient to rest and stay hydrated. Referred patient to primary care provider. Discussed with patient to avoid any physical strenuous activity. Discussed with patient to closely monitor symptoms and if symptoms are to worsen or change to report back to the ED - strict return instructions given.  Patient agreed to plan of care, understood, all questions answered.   Jamse Mead, PA-C 11/03/13 1827

## 2013-11-03 NOTE — Discharge Instructions (Signed)
Please call your doctor for a followup appointment within 24-48 hours. When you talk to your doctor please let them know that you were seen in the emergency department and have them acquire all of your records so that they can discuss the findings with you and formulate a treatment plan to fully care for your new and ongoing problems. Please call and set-up an appointment with your primary care provider Please rest and stay hydrated Please avoid any physical or strenuous activity  Please continue to take antibiotics as prescribed Please continue to monitor symptoms closely and if symptoms are to worsen or change (fever greater than 101, chills, neck pain, neck stiffness, chest pain, shortness of breath, difficulty breathing, numbness, tingling, worsening or changes to pain pattern, nausea, vomiting, diarrhea, swelling to the neck, inability to swallow, ear pain, eye pain, weakness, fatigue) please report back to the ED immediately  Chest Pain (Nonspecific) It is often hard to give a specific diagnosis for the cause of chest pain. There is always a chance that your pain could be related to something serious, such as a heart attack or a blood clot in the lungs. You need to follow up with your health care provider for further evaluation. CAUSES   Heartburn.  Pneumonia or bronchitis.  Anxiety or stress.  Inflammation around your heart (pericarditis) or lung (pleuritis or pleurisy).  A blood clot in the lung.  A collapsed lung (pneumothorax). It can develop suddenly on its own (spontaneous pneumothorax) or from trauma to the chest.  Shingles infection (herpes zoster virus). The chest wall is composed of bones, muscles, and cartilage. Any of these can be the source of the pain.  The bones can be bruised by injury.  The muscles or cartilage can be strained by coughing or overwork.  The cartilage can be affected by inflammation and become sore (costochondritis). DIAGNOSIS  Lab tests or other  studies may be needed to find the cause of your pain. Your health care provider may have you take a test called an ambulatory electrocardiogram (ECG). An ECG records your heartbeat patterns over a 24-hour period. You may also have other tests, such as:  Transthoracic echocardiogram (TTE). During echocardiography, sound waves are used to evaluate how blood flows through your heart.  Transesophageal echocardiogram (TEE).  Cardiac monitoring. This allows your health care provider to monitor your heart rate and rhythm in real time.  Holter monitor. This is a portable device that records your heartbeat and can help diagnose heart arrhythmias. It allows your health care provider to track your heart activity for several days, if needed.  Stress tests by exercise or by giving medicine that makes the heart beat faster. TREATMENT   Treatment depends on what may be causing your chest pain. Treatment may include:  Acid blockers for heartburn.  Anti-inflammatory medicine.  Pain medicine for inflammatory conditions.  Antibiotics if an infection is present.  You may be advised to change lifestyle habits. This includes stopping smoking and avoiding alcohol, caffeine, and chocolate.  You may be advised to keep your head raised (elevated) when sleeping. This reduces the chance of acid going backward from your stomach into your esophagus. Most of the time, nonspecific chest pain will improve within 2-3 days with rest and mild pain medicine.  HOME CARE INSTRUCTIONS   If antibiotics were prescribed, take them as directed. Finish them even if you start to feel better.  For the next few days, avoid physical activities that bring on chest pain. Continue physical activities  as directed.  Do not use any tobacco products, including cigarettes, chewing tobacco, or electronic cigarettes.  Avoid drinking alcohol.  Only take medicine as directed by your health care provider.  Follow your health care  provider's suggestions for further testing if your chest pain does not go away.  Keep any follow-up appointments you made. If you do not go to an appointment, you could develop lasting (chronic) problems with pain. If there is any problem keeping an appointment, call to reschedule. SEEK MEDICAL CARE IF:   Your chest pain does not go away, even after treatment.  You have a rash with blisters on your chest.  You have a fever. SEEK IMMEDIATE MEDICAL CARE IF:   You have increased chest pain or pain that spreads to your arm, neck, jaw, back, or abdomen.  You have shortness of breath.  You have an increasing cough, or you cough up blood.  You have severe back or abdominal pain.  You feel nauseous or vomit.  You have severe weakness.  You faint.  You have chills. This is an emergency. Do not wait to see if the pain will go away. Get medical help at once. Call your local emergency services (911 in [place] Do not drive yourself to the hospital. MAKE SURE YOU:   Understand these instructions.  Will watch your condition.  Will get help right away if you are not doing well or get worse. Document Released: 01/26/2005 Document Revised: 04/23/2013 Document Reviewed: 11/22/2007 [facility] Patient Information 2015 [place], [place]. This information is not intended to replace advice given to you by your health care provider. Make sure you discuss any questions you have with your health care provider.  Upper Respiratory Infection, Adult An upper respiratory infection (URI) is also known as the common cold. It is often caused by a type of germ (virus). Colds are easily spread (contagious). You can pass it to others by kissing, coughing, sneezing, or drinking out of the same glass. Usually, you get better in 1 or 2 weeks.  HOME CARE   Only take medicine as told by your doctor.  Use a warm mist humidifier or breathe in steam from a hot shower.  Drink enough water and fluids to keep your pee  (urine) clear or pale yellow.  Get plenty of rest.  Return to work when your temperature is back to normal or as told by your doctor. You may use a face mask and wash your hands to stop your cold from spreading. GET HELP RIGHT AWAY IF:   After the first few days, you feel you are getting worse.  You have questions about your medicine.  You have chills, shortness of breath, or brown or red spit (mucus).  You have yellow or brown snot (nasal discharge) or pain in the face, especially when you bend forward.  You have a fever, puffy (swollen) neck, pain when you swallow, or white spots in the back of your throat.  You have a bad headache, ear pain, sinus pain, or chest pain.  You have a high-pitched whistling sound when you breathe in and out (wheezing).  You have a lasting cough or cough up blood.  You have sore muscles or a stiff neck. MAKE SURE YOU:   Understand these instructions.  Will watch your condition.  Will get help right away if you are not doing well or get worse. Document Released: 10/05/2007 Document Revised: 07/11/2011 Document Reviewed: 08/23/2010 [facility] Patient Information 2015 [place], [place]. This information is not intended  to replace advice given to you by your health care provider. Make sure you discuss any questions you have with your health care provider. ° °

## 2013-11-05 ENCOUNTER — Encounter (HOSPITAL_COMMUNITY): Payer: Self-pay | Admitting: Emergency Medicine

## 2013-11-05 ENCOUNTER — Emergency Department (HOSPITAL_COMMUNITY)
Admission: EM | Admit: 2013-11-05 | Discharge: 2013-11-05 | Disposition: A | Discharge status: Home / Self Care | Payer: BC Managed Care – PPO | Attending: Emergency Medicine | Admitting: Emergency Medicine

## 2013-11-05 DIAGNOSIS — R0789 Other chest pain: Secondary | ICD-10-CM

## 2013-11-05 DIAGNOSIS — J45901 Unspecified asthma with (acute) exacerbation: Secondary | ICD-10-CM | POA: Insufficient documentation

## 2013-11-05 DIAGNOSIS — R42 Dizziness and giddiness: Secondary | ICD-10-CM | POA: Insufficient documentation

## 2013-11-05 DIAGNOSIS — Z79899 Other long term (current) drug therapy: Secondary | ICD-10-CM | POA: Insufficient documentation

## 2013-11-05 DIAGNOSIS — F411 Generalized anxiety disorder: Secondary | ICD-10-CM | POA: Insufficient documentation

## 2013-11-05 DIAGNOSIS — Z792 Long term (current) use of antibiotics: Secondary | ICD-10-CM | POA: Insufficient documentation

## 2013-11-05 DIAGNOSIS — Z7982 Long term (current) use of aspirin: Secondary | ICD-10-CM | POA: Insufficient documentation

## 2013-11-05 DIAGNOSIS — R0602 Shortness of breath: Secondary | ICD-10-CM

## 2013-11-05 DIAGNOSIS — R071 Chest pain on breathing: Secondary | ICD-10-CM | POA: Insufficient documentation

## 2013-11-05 DIAGNOSIS — F419 Anxiety disorder, unspecified: Secondary | ICD-10-CM

## 2013-11-05 LAB — CULTURE, GROUP A STREP

## 2013-11-05 MED ORDER — DEXAMETHASONE SODIUM PHOSPHATE 10 MG/ML IJ SOLN
10.0000 mg | Freq: Once | INTRAMUSCULAR | Status: AC
  Administered 2013-11-05: 10 mg via INTRAMUSCULAR
  Filled 2013-11-05: qty 1

## 2013-11-05 MED ORDER — IBUPROFEN 800 MG PO TABS
800.0000 mg | ORAL_TABLET | Freq: Once | ORAL | Status: AC
  Administered 2013-11-05: 800 mg via ORAL
  Filled 2013-11-05: qty 1

## 2013-11-05 NOTE — ED Provider Notes (Signed)
Medical screening examination/treatment/procedure(s) were performed by non-physician practitioner and as supervising physician I was immediately available for consultation/collaboration.    Dorie Rank, MD 11/05/13 2226

## 2013-11-05 NOTE — ED Provider Notes (Signed)
CSN: [id number]     Arrival date & time 11/05/13  2100 History   First MD Initiated Contact with Patient 11/05/13 2119     Chief Complaint  Patient presents with  . Shortness of Breath     (Consider location/radiation/quality/duration/timing/severity/associated sxs/prior Treatment) The history is provided by the patient and medical records. No language interpreter was used.    Wanda Nixon is a 23 y.o. female  with a hx of asthma, anxiety presents to the Emergency Department complaining of a persistent sinus pressure and pain. She reports that when she lays down at night it is difficult to break her nose and this makes her feel as if she is short of breath.  She reports that today she had some left-sided, reproducible chest pain which has been intermittent for several weeks and this in combination with her sinus pressure posterior to feel as if she could not breathe. She reports she began breathing very quickly.  Patient reports associated lightheadedness during this episode. She also endorses history of anxiety with panic attacks. She reports that she has had significantly increased stress in her life for the last several weeks she is scheduled to be married in one week.  She reports taking Ativan in the past for panic attacks and upon further questioning states that today's episode felt very much like a panic attack.  Patient reports that her doctor has been treating her for sinusitis with Augmentin.  She reports she has an appointment to see her primary care physician in the morning.  Record review shows that pt was seen on 11/03/13 for SOB.  At that time he d-dimer was negative, CXR was negative, she was PERC negative, ECG WNL and troponin negative.  Pt dx with viral URI and was already taking Augmentin for sinusitis.     Past Medical History  Diagnosis Date  . Asthma   . Anxiety    Past Surgical History  Procedure Laterality Date  . Mouth surgery    . Wisdom tooth extraction      Family History  Problem Relation Age of Onset  . Diabetes Maternal Grandmother   . High blood pressure Mother    History  Substance Use Topics  . Smoking status: Never Smoker   . Smokeless tobacco: Never Used  . Alcohol Use: No   OB History   Grav Para Term Preterm Abortions TAB SAB Ect Mult Living                 Review of Systems  Constitutional: Negative for fever, diaphoresis, appetite change, fatigue and unexpected weight change.  HENT: Positive for congestion. Negative for mouth sores.   Eyes: Negative for visual disturbance.  Respiratory: Positive for shortness of breath. Negative for cough, chest tightness and wheezing.   Cardiovascular: Positive for chest pain (Left upper).  Gastrointestinal: Negative for nausea, vomiting, abdominal pain, diarrhea and constipation.  Endocrine: Negative for polydipsia, polyphagia and polyuria.  Genitourinary: Negative for dysuria, urgency, frequency and hematuria.  Musculoskeletal: Negative for back pain and neck stiffness.  Skin: Negative for rash.  Allergic/Immunologic: Negative for immunocompromised state.  Neurological: Negative for syncope, light-headedness and headaches.  Hematological: Does not bruise/bleed easily.  Psychiatric/Behavioral: Negative for sleep disturbance. The patient is not nervous/anxious.       Allergies  Pulmicort and Tomato  Home Medications   Prior to Admission medications   Medication Sig Start Date End Date Taking? Authorizing Provider  albuterol (PROVENTIL HFA;VENTOLIN HFA) 108 (90 BASE) MCG/ACT inhaler Inhale 2  puffs into the lungs every 6 (six) hours as needed for wheezing or shortness of breath.   Yes Historical Provider, MD  amoxicillin-clavulanate (AUGMENTIN) 875-125 MG per tablet Take 1 tablet by mouth 2 (two) times daily.   Yes Historical Provider, MD  aspirin-sod bicarb-citric acid (ALKA-SELTZER) 325 MG TBEF tablet Take 325 mg by mouth every 6 (six) hours as needed (cold and sinus  symptoms.).   Yes Historical Provider, MD  carboxymethylcellulose (REFRESH PLUS) 0.5 % SOLN Place 2 drops into both eyes daily as needed (dry eyes.).   Yes Historical Provider, MD  ibuprofen (ADVIL,MOTRIN) 200 MG tablet Take 400 mg by mouth every 6 (six) hours as needed for headache.   Yes Historical Provider, MD  PRESCRIPTION MEDICATION Place 1-2 drops into both eyes 2 (two) times daily as needed (allergies).   Yes Historical Provider, MD   BP 136/92  Pulse 87  Temp(Src) 98 F (36.7 C)  Resp 20  SpO2 100%  LMP 11/03/2013 Physical Exam  Nursing note and vitals reviewed. Constitutional: She is oriented to person, place, and time. She appears well-developed and well-nourished. No distress.  Awake, alert, nontoxic appearance  HENT:  Head: Normocephalic and atraumatic.  Right Ear: Tympanic membrane, external ear and ear canal normal.  Left Ear: Tympanic membrane, external ear and ear canal normal.  Nose: Mucosal edema present. No rhinorrhea. No epistaxis. Right sinus exhibits maxillary sinus tenderness. Right sinus exhibits no frontal sinus tenderness. Left sinus exhibits maxillary sinus tenderness. Left sinus exhibits no frontal sinus tenderness.  Mouth/Throat: Uvula is midline, oropharynx is clear and moist and mucous membranes are normal. Mucous membranes are not pale and not cyanotic. No oropharyngeal exudate, posterior oropharyngeal edema, posterior oropharyngeal erythema or tonsillar abscesses.  Mild maxillary sinus tenderness with mucosal edema no evidence of purulent rhinorrhea  Eyes: Conjunctivae are normal. Pupils are equal, round, and reactive to light. No scleral icterus.  Neck: Normal range of motion and full passive range of motion without pain. Neck supple.  Cardiovascular: Normal rate, regular rhythm, normal heart sounds and intact distal pulses.   Regular rate and rhythm No murmur  Pulmonary/Chest: Effort normal and breath sounds normal. No stridor. No respiratory distress.  She has no wheezes. She exhibits tenderness.  Clear and equal breath sounds without focal or diffuse wheezing, rhonchi or rales Immediately reproducible chest pain with palpation of the left upper anterior chest  Abdominal: Soft. Bowel sounds are normal. She exhibits no mass. There is no tenderness. There is no rebound and no guarding.  Abdomen soft and nontender  Musculoskeletal: Normal range of motion. She exhibits no edema.  Lymphadenopathy:    She has no cervical adenopathy.  Neurological: She is alert and oriented to person, place, and time. She exhibits normal muscle tone. Coordination normal.  Speech is clear and goal oriented Moves extremities without ataxia  Skin: Skin is warm and dry. No rash noted. She is not diaphoretic. No erythema.  Psychiatric: She has a normal mood and affect.    ED Course  Procedures (including critical care time) Labs Review Labs Reviewed - No data to display  Imaging Review No results found.   EKG Interpretation None      MDM   Final diagnoses:  Chest wall pain  Anxiety  Shortness of breath   Wanda Nixon resents with persistent sinusitis with consistently reproducible chest wall pain.  I spoke with patient at length and she endorses numerous reasons to be anxious ultimately stating that today's episode felt very much  like her previous anxiety attacks. She reports she is overwhelmed with preparations for her upcoming wedding and she dislikes feeling poorly.    Patient with oxygen saturations of 100% on room air, no tachycardia or tachypnea.  Patient with significant workup yesterday negative for PE, ACS, pneumonia or asthma exacerbation. No evidence of any of these things today and I do not feel that further testing would be helpful.  Offered patient IM Decadron and ibuprofen for her chest wall pain. Recommended saline flushes for her nose and breathing techniques for her anxiety. Patient reports she feels much better prior to  being administered these and will followup with her primary care physician in the morning.  I have personally reviewed patient's vitals, nursing note and any pertinent labs or imaging.  I performed an undressed physical exam.    At this time, it has been determined that no acute conditions requiring further emergency intervention. The patient/guardian have been advised of the diagnosis and plan. I reviewed all labs and imaging including any potential incidental findings. We have discussed signs and symptoms that warrant return to the ED, such as increasing shortness of breath, high fevers, cough, wheezing but does not resolve which were albuterol.  Patient/guardian has voiced understanding and agreed to follow-up with the PCP or specialist in 24 hours at your already scheduled appointment for tomorrow morning.  Vital signs are stable at discharge.   BP 136/92  Pulse 87  Temp(Src) 98 F (36.7 C)  Resp 20  SpO2 100%  LMP 11/03/2013        Abigail Butts, PA-C 11/05/13 2218

## 2013-11-05 NOTE — ED Notes (Signed)
Pt complains of being short of breath since Sunday, hx of asthma, she states that the left side of her chest feels swollen

## 2013-11-05 NOTE — Discharge Instructions (Signed)
1. Medications: usual home medications including your Augmentin;  Ibuprofen 860m three times per day with food for 1 week 2. Treatment: rest, drink plenty of fluids, use nasal saline 3. Follow Up: Please followup with your primary doctor TOMORROW for discussion of your diagnoses and further evaluation after today's visit;     Chest Wall Pain Chest wall pain is pain felt in or around the chest bones and muscles. It may take up to 6 weeks to get better. It may take longer if you are active. Chest wall pain can happen on its own. Other times, things like germs, injury, coughing, or exercise can cause the pain. HOME CARE   Avoid activities that make you tired or cause pain. Try not to use your chest, belly (abdominal), or side muscles. Do not use heavy weights.  Put ice on the sore area.  Put ice in a plastic bag.  Place a towel between your skin and the bag.  Leave the ice on for 15-20 minutes for the first 2 days.  Only take medicine as told by your doctor. GET HELP RIGHT AWAY IF:   You have more pain or are very uncomfortable.  You have a fever.  Your chest pain gets worse.  You have new problems.  You feel sick to your stomach (nauseous) or throw up (vomit).  You start to sweat or feel lightheaded.  You have a cough with mucus (phlegm).  You cough up blood. MAKE SURE YOU:   Understand these instructions.  Will watch your condition.  Will get help right away if you are not doing well or get worse. Document Released: 10/05/2007 Document Revised: 07/11/2011 Document Reviewed: 12/13/2010 [facility] Information 2015 [place] This information is not intended to replace advice given to you by your health care provider. Make sure you discuss any questions you have with your health care provider.   Stress and Stress Management Stress is a normal reaction to life events. It is what you feel when life demands more than you are used to or more than you can handle.  Some stress can be useful. For example, the stress reaction can help you catch the last bus of the day, study for a test, or meet a deadline at work. But stress that occurs too often or for too long can cause problems. It can affect your emotional health and interfere with relationships and normal daily activities. Too much stress can weaken your immune system and increase your risk for physical illness. If you already have a medical problem, stress can make it worse. CAUSES  All sorts of life events may cause stress. An event that causes stress for one person may not be stressful for another person. Major life events commonly cause stress. These may be positive or negative. Examples include losing your job, moving into a new home, getting married, having a baby, or losing a loved one. Less obvious life events may also cause stress, especially if they occur day after day or in combination. Examples include working long hours, driving in traffic, caring for children, being in debt, or being in a difficult relationship. SIGNS AND SYMPTOMS Stress may cause emotional symptoms including, the following:  Anxiety--This is feeling worried, afraid, on edge, overwhelmed, or out of control.  Anger--This is feeling irritated or impatient.  Depression--This is feeling sad, down, helpless, or guilty.  Difficulty focusing, remembering, or making decisions. Stress may cause physical symptoms, including the following:   Aches and pains--These may affect your head,  neck, back, stomach, or other areas of your body.  Tight muscles or clenched jaw.  Low energy or trouble sleeping. Stress may cause unhealthy behaviors, including the following:   Eating to feel better (overeating) or skipping meals.  Sleeping too little, too much, or both.  Working too much or putting off tasks (procrastination).  Smoking, drinking alcohol, or using drugs to feel better. DIAGNOSIS  Stress is diagnosed through an assessment  by your health care provider. Your health care provider will ask questions about your symptoms and any stressful life events.Your health care provider will also ask about your medical history and may order blood tests or other tests. Certain medical conditions and medicine can cause physical symptoms similar to stress. Mental illness can cause emotional symptoms and unhealthy behaviors similar to stress. Your health care provider may refer you to a mental health professional for further evaluation.  TREATMENT  Stress management is the recommended treatment for stress.The goals of stress management are reducing stressful life events and coping with stress in healthy ways.  Techniques for reducing stressful life events include the following:  Stress identification--Self-monitor for stress and identify what causes stress for you. These skills may help you to avoid some stressful events.  Time management--Set your priorities, keep a calendar of events, and learn to say "no." These tools can help you avoid making too many commitments. Techniques for coping with stress include the following:  Rethinking the problem--Try to think realistically about stressful events rather than ignoring them or overreacting. Try to find the positives in a stressful situation rather than focusing on the negatives.  Exercise--Physical exercise can release both physical and emotional tension. The key is to find a form of exercise you enjoy and do it regularly.  Relaxation techniques. These relax the body and mind. Examples include yoga, meditation, tai chi, biofeedback, deep breathing, progressive muscle relaxation, listening to music, being out in nature, journaling, and other hobbies. Again, the key is to find one or more that you enjoy and can do regularly.  Healthy lifestyle--Eat a balanced diet, get plenty of sleep, and do not smoke. Avoid using alcohol or drugs to relax.  Strong support network--Spend time with  family, friends, or other people you enjoy being around.Express your feelings and talk things over with someone you trust. Counseling or talktherapy with a mental health professional may be helpful if you are having difficulty managing stress on your own. Medicine is typically not recommended for the treatment of stress.Talk to your health care provider if you think you need medicine for symptoms of stress. HOME CARE INSTRUCTIONS:  Keep all follow up appointments with your health care provider.  Only take any prescribed medicines as directed by your health care provider.  Talk to your health care provider before starting any new prescription or over-the-counter medicines. SEEK MEDICAL CARE IF:  Your symptoms get worse or you start having new symptoms.  You feel overwhelmed by your problems and can no longer manage them on your own. SEEK IMMEDIATE MEDICAL CARE IF:  You feel like hurting yourself or someone else. Document Released: 10/12/2000 Document Revised: 04/23/2013 Document Reviewed: 12/11/2012 [facility] Patient Information 2015 [place], [place]. This information is not intended to replace advice given to you by your health care provider. Make sure you discuss any questions you have with your health care provider.

## 2013-11-07 NOTE — ED Provider Notes (Signed)
  Medical screening examination/treatment/procedure(s) were performed by non-physician practitioner and as supervising physician I was immediately available for consultation/collaboration.   EKG Interpretation   Date/Time:  Sunday November 03 2013 14:08:35 EDT Ventricular Rate:  96 PR Interval:  152 QRS Duration: 64 QT Interval:  318 QTC Calculation: 401 R Axis:   54 Text Interpretation:  Normal sinus rhythm with sinus arrhythmia  Nonspecific T wave abnormality Abnormal ECG Sinus rhythm Sinus arrhythmia  Artifact T wave abnormality Abnormal ekg Confirmed by Carmin Muskrat  MD  ([id number]) on 11/03/2013 5:29:37 PM         Carmin Muskrat, MD 11/07/13 [phone]

## 2013-11-25 ENCOUNTER — Other Ambulatory Visit: Payer: Self-pay | Admitting: Family Medicine

## 2013-11-25 DIAGNOSIS — R5381 Other malaise: Secondary | ICD-10-CM

## 2013-11-25 DIAGNOSIS — R5383 Other fatigue: Principal | ICD-10-CM

## 2013-11-25 DIAGNOSIS — R29818 Other symptoms and signs involving the nervous system: Secondary | ICD-10-CM

## 2013-11-27 ENCOUNTER — Ambulatory Visit
Admission: RE | Admit: 2013-11-27 | Discharge: 2013-11-27 | Disposition: A | Discharge status: Home / Self Care | Payer: 59 | Source: Ambulatory Visit | Attending: Family Medicine | Admitting: Family Medicine

## 2013-11-27 DIAGNOSIS — R29818 Other symptoms and signs involving the nervous system: Secondary | ICD-10-CM

## 2013-11-27 DIAGNOSIS — R5383 Other fatigue: Principal | ICD-10-CM

## 2013-11-27 DIAGNOSIS — R5381 Other malaise: Secondary | ICD-10-CM

## 2013-11-27 MED ORDER — GADOBENATE DIMEGLUMINE 529 MG/ML IV SOLN
10.0000 mL | Freq: Once | INTRAVENOUS | Status: AC | PRN
  Administered 2013-11-27: 10 mL via INTRAVENOUS

## 2013-11-28 ENCOUNTER — Other Ambulatory Visit (HOSPITAL_COMMUNITY): Payer: Self-pay | Admitting: Respiratory Therapy

## 2013-11-28 DIAGNOSIS — J45909 Unspecified asthma, uncomplicated: Secondary | ICD-10-CM

## 2013-11-29 ENCOUNTER — Ambulatory Visit (HOSPITAL_COMMUNITY)
Admission: RE | Admit: 2013-11-29 | Discharge: 2013-11-29 | Disposition: A | Discharge status: Home / Self Care | Payer: 59 | Source: Ambulatory Visit | Attending: Family Medicine | Admitting: Family Medicine

## 2013-11-29 ENCOUNTER — Encounter (INDEPENDENT_AMBULATORY_CARE_PROVIDER_SITE_OTHER): Payer: Self-pay

## 2013-11-29 DIAGNOSIS — J45909 Unspecified asthma, uncomplicated: Secondary | ICD-10-CM | POA: Diagnosis present

## 2013-11-29 MED ORDER — ALBUTEROL SULFATE (2.5 MG/3ML) 0.083% IN NEBU
2.5000 mg | INHALATION_SOLUTION | Freq: Once | RESPIRATORY_TRACT | Status: AC
  Administered 2013-11-29: 2.5 mg via RESPIRATORY_TRACT

## 2013-12-01 ENCOUNTER — Other Ambulatory Visit: Payer: BC Managed Care – PPO

## 2013-12-02 LAB — PULMONARY FUNCTION TEST
DL/VA % PRED: 143 %
DL/VA: 5.98 ml/min/mmHg/L
DLCO UNC % PRED: 108 %
DLCO unc: 19.7 ml/min/mmHg
FEF 25-75 POST: 1.76 L/s
FEF 25-75 Pre: 2.75 L/sec
FEF2575-%CHANGE-POST: -35 %
FEF2575-%PRED-PRE: 85 %
FEF2575-%Pred-Post: 54 %
FEV1-%Change-Post: -12 %
FEV1-%PRED-POST: 77 %
FEV1-%Pred-Pre: 89 %
FEV1-PRE: 2.23 L
FEV1-Post: 1.95 L
FEV1FVC-%Change-Post: 0 %
FEV1FVC-%PRED-PRE: 101 %
FEV6-%CHANGE-POST: -13 %
FEV6-%Pred-Post: 78 %
FEV6-%Pred-Pre: 90 %
FEV6-Post: 2.18 L
FEV6-Pre: 2.51 L
FEV6FVC-%PRED-POST: 100 %
FEV6FVC-%Pred-Pre: 100 %
FVC-%Change-Post: -13 %
FVC-%PRED-POST: 78 %
FVC-%Pred-Pre: 90 %
FVC-Post: 2.18 L
FVC-Pre: 2.51 L
PRE FEV6/FVC RATIO: 100 %
Post FEV1/FVC ratio: 89 %
Post FEV6/FVC ratio: 100 %
Pre FEV1/FVC ratio: 89 %
RV % pred: 146 %
RV: 1.45 L
TLC % pred: 80 %
TLC: 3.54 L

## 2013-12-11 ENCOUNTER — Emergency Department (HOSPITAL_COMMUNITY): Payer: 59

## 2013-12-11 ENCOUNTER — Emergency Department (HOSPITAL_COMMUNITY)
Admission: EM | Admit: 2013-12-11 | Discharge: 2013-12-12 | Disposition: A | Discharge status: Home / Self Care | Payer: 59 | Attending: Emergency Medicine | Admitting: Emergency Medicine

## 2013-12-11 ENCOUNTER — Encounter (HOSPITAL_COMMUNITY): Payer: Self-pay | Admitting: Emergency Medicine

## 2013-12-11 DIAGNOSIS — Z3202 Encounter for pregnancy test, result negative: Secondary | ICD-10-CM | POA: Insufficient documentation

## 2013-12-11 DIAGNOSIS — Z7982 Long term (current) use of aspirin: Secondary | ICD-10-CM | POA: Diagnosis not present

## 2013-12-11 DIAGNOSIS — Z791 Long term (current) use of non-steroidal anti-inflammatories (NSAID): Secondary | ICD-10-CM | POA: Insufficient documentation

## 2013-12-11 DIAGNOSIS — M940 Chondrocostal junction syndrome [Tietze]: Secondary | ICD-10-CM | POA: Diagnosis not present

## 2013-12-11 DIAGNOSIS — Z79899 Other long term (current) drug therapy: Secondary | ICD-10-CM | POA: Insufficient documentation

## 2013-12-11 DIAGNOSIS — J45909 Unspecified asthma, uncomplicated: Secondary | ICD-10-CM | POA: Insufficient documentation

## 2013-12-11 DIAGNOSIS — Z8659 Personal history of other mental and behavioral disorders: Secondary | ICD-10-CM | POA: Diagnosis not present

## 2013-12-11 DIAGNOSIS — R079 Chest pain, unspecified: Secondary | ICD-10-CM | POA: Diagnosis present

## 2013-12-11 LAB — BASIC METABOLIC PANEL
ANION GAP: 12 (ref 5–15)
BUN: 13 mg/dL (ref 6–23)
CALCIUM: 9.9 mg/dL (ref 8.4–10.5)
CHLORIDE: 100 meq/L (ref 96–112)
CO2: 26 mEq/L (ref 19–32)
Creatinine, Ser: 0.67 mg/dL (ref 0.50–1.10)
Glucose, Bld: 92 mg/dL (ref 70–99)
Potassium: 4.8 mEq/L (ref 3.7–5.3)
Sodium: 138 mEq/L (ref 137–147)

## 2013-12-11 LAB — CBC
HCT: 37.4 % (ref 36.0–46.0)
HEMOGLOBIN: 13 g/dL (ref 12.0–15.0)
MCH: 29.1 pg (ref 26.0–34.0)
MCHC: 34.8 g/dL (ref 30.0–36.0)
MCV: 83.9 fL (ref 78.0–100.0)
Platelets: 360 10*3/uL (ref 150–400)
RBC: 4.46 MIL/uL (ref 3.87–5.11)
RDW: 12.9 % (ref 11.5–15.5)
WBC: 5.1 10*3/uL (ref 4.0–10.5)

## 2013-12-11 LAB — I-STAT TROPONIN, ED: TROPONIN I, POC: 0 ng/mL (ref 0.00–0.08)

## 2013-12-11 LAB — PREGNANCY, URINE: PREG TEST UR: NEGATIVE

## 2013-12-11 MED ORDER — NAPROXEN 375 MG PO TABS: 375.0000 mg | ORAL_TABLET | Freq: Two times a day (BID) | ORAL | Status: DC

## 2013-12-11 MED ORDER — ONDANSETRON 4 MG PO TBDP
4.0000 mg | ORAL_TABLET | Freq: Once | ORAL | Status: AC
  Administered 2013-12-12: 4 mg via ORAL
  Filled 2013-12-11: qty 1

## 2013-12-11 MED ORDER — HYDROCODONE-ACETAMINOPHEN 5-325 MG PO TABS
1.0000 | ORAL_TABLET | Freq: Once | ORAL | Status: AC
  Administered 2013-12-12: 1 via ORAL
  Filled 2013-12-11: qty 1

## 2013-12-11 MED ORDER — CYCLOBENZAPRINE HCL 10 MG PO TABS
5.0000 mg | ORAL_TABLET | Freq: Once | ORAL | Status: AC
  Administered 2013-12-12: 5 mg via ORAL
  Filled 2013-12-11: qty 1

## 2013-12-11 MED ORDER — CYCLOBENZAPRINE HCL 10 MG PO TABS: 10.0000 mg | ORAL_TABLET | Freq: Two times a day (BID) | ORAL | Status: DC | PRN

## 2013-12-11 NOTE — ED Notes (Signed)
Pt. On cardiac monitor. 

## 2013-12-11 NOTE — ED Provider Notes (Signed)
CSN: [id number]     Arrival date & time 12/11/13  1947 History   None    Chief Complaint  Patient presents with  . Chest Pain     (Consider location/radiation/quality/duration/timing/severity/associated sxs/prior Treatment) HPI  Patient to the ER with complaints of left chest pain with movement. No pain at rest. It has been off and on over the past week. She denies noticing much change with a big breath. The nurse notes that she does have pain with big breath but the pt denies this to me. She has not had any diaphoresis, nausea, vomiting, abdominal pains, weakness.  She has noticed some paresthesias in her left arm. But no weakness or pain. Denies family history of blood clots, no personal history of blood clots. No family history of aortic disease or cardiac disease. Pt currently no pain at rest. PMH + for asthma  Past Medical History  Diagnosis Date  . Asthma   . Anxiety    Past Surgical History  Procedure Laterality Date  . Mouth surgery    . Wisdom tooth extraction     Family History  Problem Relation Age of Onset  . Diabetes Maternal Grandmother   . High blood pressure Mother    History  Substance Use Topics  . Smoking status: Never Smoker   . Smokeless tobacco: Never Used  . Alcohol Use: No   OB History   Grav Para Term Preterm Abortions TAB SAB Ect Mult Living                 Review of Systems  Review of Systems  Gen: no weight loss, fevers, chills, night sweats  Eyes: no discharge or drainage, no occular pain or visual changes  Nose: no epistaxis or rhinorrhea  Mouth: no dental pain, no sore throat  Neck: no neck pain  Lungs:No wheezing or hemoptysis No coughing CV:  No palpitations, dependent edema or orthopnea. + chest wall pain Abd: no diarrhea. No nausea or vomiting, No abdominal pain  GU: no dysuria or gross hematuria  MSK:  No muscle weakness, No  pain Neuro: no headache, no focal neurologic deficits  Skin: no rash , no wounds Psyche: no  complaints     Allergies  Pulmicort and Tomato  Home Medications   Prior to Admission medications   Medication Sig Start Date End Date Taking? Authorizing Provider  albuterol (PROVENTIL HFA;VENTOLIN HFA) 108 (90 BASE) MCG/ACT inhaler Inhale 2 puffs into the lungs every 6 (six) hours as needed for wheezing or shortness of breath.   Yes Historical Provider, MD  aspirin-acetaminophen-caffeine (EXCEDRIN MIGRAINE) [phone] MG per tablet Take 2 tablets by mouth every 6 (six) hours as needed for headache.   Yes Historical Provider, MD  ibuprofen (ADVIL,MOTRIN) 200 MG tablet Take 400 mg by mouth every 6 (six) hours as needed for headache.   Yes Historical Provider, MD  Propylene Glycol (SYSTANE BALANCE) 0.6 % SOLN Apply 2 drops to eye every hour.   Yes Historical Provider, MD  cyclobenzaprine (FLEXERIL) 10 MG tablet Take 1 tablet (10 mg total) by mouth 2 (two) times daily as needed for muscle spasms. 12/11/13   Marshell Dilauro Marilu Favre, PA-C  naproxen (NAPROSYN) 375 MG tablet Take 1 tablet (375 mg total) by mouth 2 (two) times daily. 12/11/13   Jazzmon Prindle Marilu Favre, PA-C   BP 121/89  Pulse 88  Temp(Src) 98.4 F (36.9 C) (Oral)  Resp 21  SpO2 100%  LMP 12/04/2013 Physical Exam  Nursing note and vitals reviewed. Constitutional:  She appears well-developed and well-nourished. No distress.  HENT:  Head: Normocephalic and atraumatic.  Eyes: Pupils are equal, round, and reactive to light.  Neck: Normal range of motion. Neck supple.  Cardiovascular: Normal rate and regular rhythm.   Pulmonary/Chest: Effort normal.    Abdominal: Soft.  Neurological: She is alert.  Skin: Skin is warm and dry.    ED Course  Procedures (including critical care time) Labs Review Labs Reviewed  PREGNANCY, URINE  BASIC METABOLIC PANEL  CBC  I-STAT [place], ED    Imaging Review Dg Chest 2 View  12/11/2013   CLINICAL DATA:  Chest pain.  EXAM: CHEST  2 VIEW  COMPARISON:  Chest radiograph performed 11/03/2013   FINDINGS: The lungs are well-aerated and clear. There is no evidence of focal opacification, pleural effusion or pneumothorax.  The heart is normal in size; the mediastinal contour is within normal limits. No acute osseous abnormalities are seen.  IMPRESSION: No acute cardiopulmonary process seen.   Electronically Signed   By: Garald Balding M.D.   On: 12/11/2013 22:32     EKG Interpretation None      MDM   Final diagnoses:  Costochondritis, acute    Patients work-up unremarkable. Very low suspician for ACS due to her age and low family hx. Low concern for PE since she is PERC negative. Will treat with muscle relaxers and antiinflammatories.  She can f/u with ortho.  23 y.o.Briannie Channelle Kenlock's evaluation in the Emergency Department is complete. It has been determined that no acute conditions requiring further emergency intervention are present at this time. The patient/guardian have been advised of the diagnosis and plan. We have discussed signs and symptoms that warrant return to the ED, such as changes or worsening in symptoms.  Vital signs are stable at discharge. Filed Vitals:   12/11/13 2202  BP: 121/89  Pulse: 88  Temp: 98.4 F (36.9 C)  Resp: 21    Patient/guardian has voiced understanding and agreed to follow-up with the PCP or specialist.     Linus Mako, PA-C 12/11/13 2316

## 2013-12-11 NOTE — ED Notes (Signed)
Pt reports L side cp radiating to her L arm x 2 days, pt reports pain is worse when taking a deep breath and with movement.  Pt denies any other sxs with cp.

## 2013-12-11 NOTE — Discharge Instructions (Signed)
Costochondritis Costochondritis, sometimes called Tietze syndrome, is a swelling and irritation (inflammation) of the tissue (cartilage) that connects your ribs with your breastbone (sternum). It causes pain in the chest and rib area. Costochondritis usually goes away on its own over time. It can take up to 6 weeks or longer to get better, especially if you are unable to limit your activities. CAUSES  Some cases of costochondritis have no known cause. Possible causes include:  Injury (trauma).  Exercise or activity such as lifting.  Severe coughing. SIGNS AND SYMPTOMS  Pain and tenderness in the chest and rib area.  Pain that gets worse when coughing or taking deep breaths.  Pain that gets worse with specific movements. DIAGNOSIS  Your health care provider will do a physical exam and ask about your symptoms. Chest X-rays or other tests may be done to rule out other problems. TREATMENT  Costochondritis usually goes away on its own over time. Your health care provider may prescribe medicine to help relieve pain. HOME CARE INSTRUCTIONS   Avoid exhausting physical activity. Try not to strain your ribs during normal activity. This would include any activities using chest, abdominal, and side muscles, especially if heavy weights are used.  Apply ice to the affected area for the first 2 days after the pain begins.  Put ice in a plastic bag.  Place a towel between your skin and the bag.  Leave the ice on for 20 minutes, 2-3 times a day.  Only take over-the-counter or prescription medicines as directed by your health care provider. SEEK MEDICAL CARE IF:  You have redness or swelling at the rib joints. These are signs of infection.  Your pain does not go away despite rest or medicine. SEEK IMMEDIATE MEDICAL CARE IF:   Your pain increases or you are very uncomfortable.  You have shortness of breath or difficulty breathing.  You cough up blood.  You have worse chest pains,  sweating, or vomiting.  You have a fever or persistent symptoms for more than 2-3 days.  You have a fever and your symptoms suddenly get worse. MAKE SURE YOU:   Understand these instructions.  Will watch your condition.  Will get help right away if you are not doing well or get worse. Document Released: 01/26/2005 Document Revised: 02/06/2013 Document Reviewed: 11/20/2012 [employer] Patient Information 2015 [employer]. This information is not intended to replace advice given to you by your health care provider. Make sure you discuss any questions you have with your health care provider.  

## 2013-12-11 NOTE — ED Notes (Signed)
Patient complaining of chest pain and numbness in fingers only when she moves or cough she feels pain. Patient says she feels like it is not chest pain. Patient is not having any trouble breathing. Patient has not have any trauma that would cause this.

## 2013-12-11 NOTE — ED Provider Notes (Signed)
Medical screening examination/treatment/procedure(s) were performed by non-physician practitioner and as supervising physician I was immediately available for consultation/collaboration.   EKG Interpretation None       Threasa Beards, MD 12/11/13 2320

## 2014-01-02 ENCOUNTER — Other Ambulatory Visit: Payer: Self-pay | Admitting: *Deleted

## 2014-01-02 DIAGNOSIS — R002 Palpitations: Secondary | ICD-10-CM

## 2014-01-02 NOTE — Progress Notes (Signed)
Referral from Blanket, PA for event monitor for palpitations. Ordered as requested Fax # [phone]

## 2014-01-07 ENCOUNTER — Inpatient Hospital Stay (HOSPITAL_COMMUNITY)
Admission: AD | Admit: 2014-01-07 | Discharge: 2014-01-07 | Disposition: A | Discharge status: Home / Self Care | Payer: Medicaid Other | Source: Ambulatory Visit | Attending: Obstetrics and Gynecology | Admitting: Obstetrics and Gynecology

## 2014-01-07 ENCOUNTER — Encounter (HOSPITAL_COMMUNITY): Payer: Self-pay | Admitting: *Deleted

## 2014-01-07 DIAGNOSIS — R059 Cough, unspecified: Secondary | ICD-10-CM | POA: Insufficient documentation

## 2014-01-07 DIAGNOSIS — N6459 Other signs and symptoms in breast: Secondary | ICD-10-CM | POA: Insufficient documentation

## 2014-01-07 DIAGNOSIS — R05 Cough: Secondary | ICD-10-CM | POA: Insufficient documentation

## 2014-01-07 DIAGNOSIS — N644 Mastodynia: Secondary | ICD-10-CM | POA: Diagnosis not present

## 2014-01-07 DIAGNOSIS — O99891 Other specified diseases and conditions complicating pregnancy: Secondary | ICD-10-CM | POA: Diagnosis present

## 2014-01-07 DIAGNOSIS — O9989 Other specified diseases and conditions complicating pregnancy, childbirth and the puerperium: Principal | ICD-10-CM

## 2014-01-07 DIAGNOSIS — Z3201 Encounter for pregnancy test, result positive: Secondary | ICD-10-CM

## 2014-01-07 HISTORY — DX: Headache: R51

## 2014-01-07 HISTORY — DX: Dermatitis, unspecified: L30.9

## 2014-01-07 LAB — POCT PREGNANCY, URINE: PREG TEST UR: POSITIVE — AB

## 2014-01-07 MED ORDER — PRENATAL 27-0.8 MG PO TABS: 1.0000 | ORAL_TABLET | Freq: Every day | ORAL | Status: DC

## 2014-01-07 NOTE — MAU Note (Signed)
Urine in lab 

## 2014-01-07 NOTE — MAU Provider Note (Signed)
  History     CSN: [id number]  Arrival date and time: 01/07/14 1843   None     No chief complaint on file.  HPI  Here for pregnancy verification, no abdominal pain, has some breast tenderness  Past Medical History  Diagnosis Date  . Asthma   . Anxiety   . Headache(784.0)   . Eczema     Past Surgical History  Procedure Laterality Date  . Mouth surgery    . Wisdom tooth extraction      Family History  Problem Relation Age of Onset  . Diabetes Maternal Grandmother   . Hypertension Maternal Grandmother   . Stroke Maternal Grandmother   . High blood pressure Mother   . Hypertension Mother     History  Substance Use Topics  . Smoking status: Never Smoker   . Smokeless tobacco: Never Used  . Alcohol Use: No    Allergies:  Allergies  Allergen Reactions  . Pulmicort [Budesonide] Palpitations    Racing heart  . Tomato Hives and Itching    Prescriptions prior to admission  Medication Sig Dispense Refill  . albuterol (PROVENTIL HFA;VENTOLIN HFA) 108 (90 BASE) MCG/ACT inhaler Inhale 2 puffs into the lungs every 6 (six) hours as needed for wheezing or shortness of breath.      Marland Kitchen aspirin-acetaminophen-caffeine (EXCEDRIN MIGRAINE) 250-250-65 MG per tablet Take 2 tablets by mouth every 6 (six) hours as needed for headache.      . cyclobenzaprine (FLEXERIL) 10 MG tablet Take 1 tablet (10 mg total) by mouth 2 (two) times daily as needed for muscle spasms.  20 tablet  0  . ibuprofen (ADVIL,MOTRIN) 200 MG tablet Take 400 mg by mouth every 6 (six) hours as needed for headache.      . naproxen (NAPROSYN) 375 MG tablet Take 1 tablet (375 mg total) by mouth 2 (two) times daily.  20 tablet  0  . Propylene Glycol (SYSTANE BALANCE) 0.6 % SOLN Apply 2 drops to eye every hour.        Review of Systems  Constitutional: Negative for chills, weight loss and malaise/fatigue.  Gastrointestinal: Positive for nausea. Negative for heartburn, vomiting, abdominal pain and diarrhea.   Genitourinary: Negative for dysuria and urgency.   Physical Exam   Blood pressure 112/89, pulse 99, temperature 98.9 F (37.2 C), temperature source Oral, resp. rate 16, height 4\' 9"  (1.448 m), weight 115 lb (52.164 kg), last menstrual period 12/04/2013, SpO2 100.00%.  Physical Exam  Constitutional: She is oriented to person, place, and time. She appears well-developed and well-nourished.  HENT:  Head: Normocephalic and atraumatic.  Eyes: Conjunctivae and EOM are normal.  Neck: Normal range of motion.  Cardiovascular: Normal rate.   Respiratory: Effort normal. No respiratory distress.  Neurological: She is alert and oriented to person, place, and time.  Skin: Skin is warm and dry. No erythema.    MAU Course  Procedures  MDM   Assessment and Plan  Verification letter given to patient, dated by [facility] rx prenatal vitamin Does not have medicaid yet, advised to establish care at health department until able to be seen at another clinic.  Wanda Nixon ROCIO 01/07/2014, 7:50 PM

## 2014-01-07 NOTE — Discharge Instructions (Signed)
Folic acid  First Trimester of Pregnancy The first trimester of pregnancy is from week 1 until the end of week 12 (months 1 through 3). A week after a sperm fertilizes an egg, the egg will implant on the wall of the uterus. This embryo will begin to develop into a baby. Genes from you and your partner are forming the baby. The female genes determine whether the baby is a boy or a girl. At 6-8 weeks, the eyes and face are formed, and the heartbeat can be seen on ultrasound. At the end of 12 weeks, all the baby's organs are formed.  Now that you are pregnant, you will want to do everything you can to have a healthy baby. Two of the most important things are to get good prenatal care and to follow your health care provider's instructions. Prenatal care is all the medical care you receive before the baby's birth. This care will help prevent, find, and treat any problems during the pregnancy and childbirth. BODY CHANGES Your body goes through many changes during pregnancy. The changes vary from woman to woman.   You may gain or lose a couple of pounds at first.  You may feel sick to your stomach (nauseous) and throw up (vomit). If the vomiting is uncontrollable, call your health care provider.  You may tire easily.  You may develop headaches that can be relieved by medicines approved by your health care provider.  You may urinate more often. Painful urination may mean you have a bladder infection.  You may develop heartburn as a result of your pregnancy.  You may develop constipation because certain hormones are causing the muscles that push waste through your intestines to slow down.  You may develop hemorrhoids or swollen, bulging veins (varicose veins).  Your breasts may begin to grow larger and become tender. Your nipples may stick out more, and the tissue that surrounds them (areola) may become darker.  Your gums may bleed and may be sensitive to brushing and flossing.  Dark spots or  blotches (chloasma, mask of pregnancy) may develop on your face. This will likely fade after the baby is born.  Your menstrual periods will stop.  You may have a loss of appetite.  You may develop cravings for certain kinds of food.  You may have changes in your emotions from day to day, such as being excited to be pregnant or being concerned that something may go wrong with the pregnancy and baby.  You may have more vivid and strange dreams.  You may have changes in your hair. These can include thickening of your hair, rapid growth, and changes in texture. Some women also have hair loss during or after pregnancy, or hair that feels dry or thin. Your hair will most likely return to normal after your baby is born. WHAT TO EXPECT AT YOUR PRENATAL VISITS During a routine prenatal visit:  You will be weighed to make sure you and the baby are growing normally.  Your blood pressure will be taken.  Your abdomen will be measured to track your baby's growth.  The fetal heartbeat will be listened to starting around week 10 or 12 of your pregnancy.  Test results from any previous visits will be discussed. Your health care provider may ask you:  How you are feeling.  If you are feeling the baby move.  If you have had any abnormal symptoms, such as leaking fluid, bleeding, severe headaches, or abdominal cramping.  If you have any  questions. Other tests that may be performed during your first trimester include:  Blood tests to find your blood type and to check for the presence of any previous infections. They will also be used to check for low iron levels (anemia) and Rh antibodies. Later in the pregnancy, blood tests for diabetes will be done along with other tests if problems develop.  Urine tests to check for infections, diabetes, or protein in the urine.  An ultrasound to confirm the proper growth and development of the baby.  An amniocentesis to check for possible genetic  problems.  Fetal screens for spina bifida and Down syndrome.  You may need other tests to make sure you and the baby are doing well. HOME CARE INSTRUCTIONS  Medicines  Follow your health care provider's instructions regarding medicine use. Specific medicines may be either safe or unsafe to take during pregnancy.  Take your prenatal vitamins as directed.  If you develop constipation, try taking a stool softener if your health care provider approves. Diet  Eat regular, well-balanced meals. Choose a variety of foods, such as meat or vegetable-based protein, fish, milk and low-fat dairy products, vegetables, fruits, and whole grain breads and cereals. Your health care provider will help you determine the amount of weight gain that is right for you.  Avoid raw meat and uncooked cheese. These carry germs that can cause birth defects in the baby.  Eating four or five small meals rather than three large meals a day may help relieve nausea and vomiting. If you start to feel nauseous, eating a few soda crackers can be helpful. Drinking liquids between meals instead of during meals also seems to help nausea and vomiting.  If you develop constipation, eat more high-fiber foods, such as fresh vegetables or fruit and whole grains. Drink enough fluids to keep your urine clear or pale yellow. Activity and Exercise  Exercise only as directed by your health care provider. Exercising will help you:  Control your weight.  Stay in shape.  Be prepared for labor and delivery.  Experiencing pain or cramping in the lower abdomen or low back is a good sign that you should stop exercising. Check with your health care provider before continuing normal exercises.  Try to avoid standing for long periods of time. Move your legs often if you must stand in one place for a long time.  Avoid heavy lifting.  Wear low-heeled shoes, and practice good posture.  You may continue to have sex unless your health care  provider directs you otherwise. Relief of Pain or Discomfort  Wear a good support bra for breast tenderness.   Take warm sitz baths to soothe any pain or discomfort caused by hemorrhoids. Use hemorrhoid cream if your health care provider approves.   Rest with your legs elevated if you have leg cramps or low back pain.  If you develop varicose veins in your legs, wear support hose. Elevate your feet for 15 minutes, 3-4 times a day. Limit salt in your diet. Prenatal Care  Schedule your prenatal visits by the twelfth week of pregnancy. They are usually scheduled monthly at first, then more often in the last 2 months before delivery.  Write down your questions. Take them to your prenatal visits.  Keep all your prenatal visits as directed by your health care provider. Safety  Wear your seat belt at all times when driving.  Make a list of emergency phone numbers, including numbers for family, friends, the hospital, and police and fire  departments. General Tips  Ask your health care provider for a referral to a local prenatal education class. Begin classes no later than at the beginning of month 6 of your pregnancy.  Ask for help if you have counseling or nutritional needs during pregnancy. Your health care provider can offer advice or refer you to specialists for help with various needs.  Do not use hot tubs, steam rooms, or saunas.  Do not douche or use tampons or scented sanitary pads.  Do not cross your legs for long periods of time.  Avoid cat litter boxes and soil used by cats. These carry germs that can cause birth defects in the baby and possibly loss of the fetus by miscarriage or stillbirth.  Avoid all smoking, herbs, alcohol, and medicines not prescribed by your health care provider. Chemicals in these affect the formation and growth of the baby.  Schedule a dentist appointment. At home, brush your teeth with a soft toothbrush and be gentle when you floss. SEEK MEDICAL  CARE IF:   You have dizziness.  You have mild pelvic cramps, pelvic pressure, or nagging pain in the abdominal area.  You have persistent nausea, vomiting, or diarrhea.  You have a bad smelling vaginal discharge.  You have pain with urination.  You notice increased swelling in your face, hands, legs, or ankles. SEEK IMMEDIATE MEDICAL CARE IF:   You have a fever.  You are leaking fluid from your vagina.  You have spotting or bleeding from your vagina.  You have severe abdominal cramping or pain.  You have rapid weight gain or loss.  You vomit blood or material that looks like coffee grounds.  You are exposed to [place] measles and have never had them.  You are exposed to fifth disease or chickenpox.  You develop a severe headache.  You have shortness of breath.  You have any kind of trauma, such as from a fall or a car accident. Document Released: 04/12/2001 Document Revised: 09/02/2013 Document Reviewed: 02/26/2013 [facility] Patient Information 2015 [place], [place]. This information is not intended to replace advice given to you by your health care provider. Make sure you discuss any questions you have with your health care provider.

## 2014-01-07 NOTE — MAU Note (Signed)
2 positive tests at home.  Wants confirmation, and an [place] to find out how far along she is, needs paperwork to file for medicaid.  Has been having a little cough- had taken therafllu... Wanted to know what she could take.

## 2014-01-09 NOTE — MAU Provider Note (Signed)
Attestation of Attending Supervision of Obstetric Fellow: Evaluation and management procedures were performed by the Obstetric Fellow under my supervision and collaboration.  I have reviewed the Obstetric Fellow's note and chart, and I agree with the management and plan.  Jacob Stinson, DO Attending Physician Faculty Practice, [facility]  

## 2014-02-06 LAB — OB RESULTS CONSOLE HIV ANTIBODY (ROUTINE TESTING): HIV: NONREACTIVE

## 2014-02-06 LAB — OB RESULTS CONSOLE RPR: RPR: NONREACTIVE

## 2014-02-06 LAB — OB RESULTS CONSOLE ABO/RH: RH Type: POSITIVE

## 2014-02-06 LAB — OB RESULTS CONSOLE ANTIBODY SCREEN: ANTIBODY SCREEN: NEGATIVE

## 2014-02-06 LAB — OB RESULTS CONSOLE RUBELLA ANTIBODY, IGM: Rubella: IMMUNE

## 2014-02-06 LAB — OB RESULTS CONSOLE GC/CHLAMYDIA
Chlamydia: NEGATIVE
GC PROBE AMP, GENITAL: NEGATIVE

## 2014-02-06 LAB — OB RESULTS CONSOLE HEPATITIS B SURFACE ANTIGEN: HEP B S AG: NEGATIVE

## 2014-03-03 ENCOUNTER — Encounter (HOSPITAL_COMMUNITY): Payer: Self-pay | Admitting: *Deleted

## 2014-03-06 ENCOUNTER — Encounter: Payer: Self-pay | Admitting: *Deleted

## 2014-03-16 ENCOUNTER — Encounter (HOSPITAL_COMMUNITY): Payer: Self-pay | Admitting: *Deleted

## 2014-03-16 ENCOUNTER — Inpatient Hospital Stay (HOSPITAL_COMMUNITY)
Admission: AD | Admit: 2014-03-16 | Discharge: 2014-03-16 | Disposition: A | Discharge status: Home / Self Care | Payer: Medicaid Other | Source: Ambulatory Visit | Attending: Obstetrics and Gynecology | Admitting: Obstetrics and Gynecology

## 2014-03-16 DIAGNOSIS — R51 Headache: Secondary | ICD-10-CM

## 2014-03-16 DIAGNOSIS — B373 Candidiasis of vulva and vagina: Secondary | ICD-10-CM | POA: Insufficient documentation

## 2014-03-16 DIAGNOSIS — O26892 Other specified pregnancy related conditions, second trimester: Secondary | ICD-10-CM | POA: Insufficient documentation

## 2014-03-16 DIAGNOSIS — B3731 Acute candidiasis of vulva and vagina: Secondary | ICD-10-CM

## 2014-03-16 DIAGNOSIS — R519 Headache, unspecified: Secondary | ICD-10-CM

## 2014-03-16 DIAGNOSIS — Z3A14 14 weeks gestation of pregnancy: Secondary | ICD-10-CM | POA: Diagnosis not present

## 2014-03-16 DIAGNOSIS — G43909 Migraine, unspecified, not intractable, without status migrainosus: Secondary | ICD-10-CM | POA: Insufficient documentation

## 2014-03-16 LAB — GLUCOSE, CAPILLARY: Glucose-Capillary: 84 mg/dL (ref 70–99)

## 2014-03-16 LAB — URINALYSIS, ROUTINE W REFLEX MICROSCOPIC
Bilirubin Urine: NEGATIVE
Glucose, UA: NEGATIVE mg/dL
Hgb urine dipstick: NEGATIVE
Ketones, ur: NEGATIVE mg/dL
NITRITE: NEGATIVE
Protein, ur: NEGATIVE mg/dL
SPECIFIC GRAVITY, URINE: 1.015 (ref 1.005–1.030)
UROBILINOGEN UA: 0.2 mg/dL (ref 0.0–1.0)
pH: 6 (ref 5.0–8.0)

## 2014-03-16 LAB — URINE MICROSCOPIC-ADD ON

## 2014-03-16 LAB — WET PREP, GENITAL
Clue Cells Wet Prep HPF POC: NONE SEEN
Trich, Wet Prep: NONE SEEN

## 2014-03-16 MED ORDER — TERCONAZOLE 0.8 % VA CREA: 1.0000 | TOPICAL_CREAM | Freq: Every day | VAGINAL | Status: DC

## 2014-03-16 MED ORDER — PROMETHAZINE HCL 25 MG PO TABS
25.0000 mg | ORAL_TABLET | Freq: Four times a day (QID) | ORAL | Status: DC | PRN
Start: 2014-03-16 — End: 2014-08-06

## 2014-03-16 NOTE — MAU Provider Note (Signed)
History     CSN: [id number]  Arrival date and time: 03/16/14 1306   First Provider Initiated Contact with Patient 03/16/14 1407      Chief Complaint  Patient presents with  . Dizziness  . Abdominal Pain  . Headache   HPI  Wanda Nixon is a 23 y.o. G1P0 at [redacted]w[redacted]d. She c/o headaches,dizziness and abd pain off/on x 3 wks. She has hx migraines, is taking Tylenol #3, it works but they return. No visual changes, not her worst migtaine. She has had low abd pain off/on x 3 wks, dull pressure, it moves from side to side. She knows it's not there baby, is only 14 wks. The pain increases with a full bladder. No bleeding or spotting,discharge, odor or itching. Nl BM yesterday. She has urinary frequency, no urgency or dysuria.  She requests an ultrasound, hasn't seen the baby in a few weeks, is a Research officer, [employer].  OB History    Gravida Para Term Preterm AB TAB SAB Ectopic Multiple Living   1               Past Medical History  Diagnosis Date  . Asthma   . Anxiety   . Headache(784.0)   . Eczema     Past Surgical History  Procedure Laterality Date  . Mouth surgery    . Wisdom tooth extraction      Family History  Problem Relation Age of Onset  . Diabetes Maternal Grandmother   . Hypertension Maternal Grandmother   . Stroke Maternal Grandmother   . High blood pressure Mother   . Hypertension Mother     History  Substance Use Topics  . Smoking status: Never Smoker   . Smokeless tobacco: Never Used  . Alcohol Use: No    Allergies:  Allergies  Allergen Reactions  . Pulmicort [Budesonide] Palpitations    Racing heart  . Tomato Hives and Itching  . Other Hives    mushrooms    Prescriptions prior to admission  Medication Sig Dispense Refill Last Dose  . acetaminophen-codeine (TYLENOL #3) 300-30 MG per tablet Take 1 tablet by mouth every 6 (six) hours as needed for moderate pain.   03/15/2014 at Unknown time  . albuterol (PROVENTIL HFA;VENTOLIN HFA) 108 (90 BASE)  MCG/ACT inhaler Inhale 2 puffs into the lungs every 6 (six) hours as needed for wheezing or shortness of breath.   Past Week at Unknown time  . Prenatal Vit-Min-FA-Fish Oil ([facility] PRENATAL GUMMY PO) Take 2 tablets by mouth daily.   03/15/2014 at Unknown time  . aspirin-acetaminophen-caffeine (EXCEDRIN MIGRAINE) 250-250-65 MG per tablet Take 2 tablets by mouth every 6 (six) hours as needed for headache.   12/10/2013 at Unknown time  . cyclobenzaprine (FLEXERIL) 10 MG tablet Take 1 tablet (10 mg total) by mouth 2 (two) times daily as needed for muscle spasms. (Patient not taking: Reported on 03/16/2014) 20 tablet 0   . Prenatal Vit-Fe Fumarate-FA (MULTIVITAMIN-PRENATAL) 27-0.8 MG TABS tablet Take 1 tablet by mouth daily at 12 noon. (Patient not taking: Reported on 03/16/2014) 30 each 11     Review of Systems  Constitutional: Negative for fever and chills.  Gastrointestinal: Positive for abdominal pain. Negative for diarrhea and constipation.  Genitourinary: Positive for frequency. Negative for dysuria and urgency.   Physical Exam   Blood pressure 112/75, pulse 102, temperature 98.2 F (36.8 C), temperature source Oral, resp. rate 16, height 4\' 11"  (1.499 m), weight 55.339 kg (122 lb), last menstrual period 12/04/2013.  Physical  Exam  Constitutional: She is oriented to person, place, and time. She appears well-developed and well-nourished.  GI: Soft. There is no tenderness.  Genitourinary:  Pelvic exam- Ext gen- nl anatomy, skin intact Vagina- mod amt thin white discharge with thick chunks Cx- closed, thick Uterus- gravid, non tender Adn- non tender  Musculoskeletal: Normal range of motion.  Neurological: She is alert and oriented to person, place, and time.  Skin: Skin is warm and dry.  Psychiatric: She has a normal mood and affect. Her behavior is normal.    MAU Course  Procedures  MDM Results for orders placed or performed during the hospital encounter of 03/16/14 (from the past 24  hour(s))  Urinalysis, Routine w reflex microscopic     Status: Abnormal   Collection Time: 03/16/14  1:10 PM  Result Value Ref Range   Color, Urine YELLOW YELLOW   APPearance CLEAR CLEAR   Specific Gravity, Urine 1.015 1.005 - 1.030   pH 6.0 5.0 - 8.0   Glucose, UA NEGATIVE NEGATIVE mg/dL   Hgb urine dipstick NEGATIVE NEGATIVE   Bilirubin Urine NEGATIVE NEGATIVE   Ketones, ur NEGATIVE NEGATIVE mg/dL   Protein, ur NEGATIVE NEGATIVE mg/dL   Urobilinogen, UA 0.2 0.0 - 1.0 mg/dL   Nitrite NEGATIVE NEGATIVE   Leukocytes, UA SMALL (A) NEGATIVE  Urine microscopic-add on     Status: Abnormal   Collection Time: 03/16/14  1:10 PM  Result Value Ref Range   Squamous Epithelial / LPF MANY (A) RARE   WBC, UA 3-6 <3 WBC/hpf   Bacteria, UA MANY (A) RARE   Urine-Other MUCOUS PRESENT   Glucose, capillary     Status: None   Collection Time: 03/16/14  2:30 PM  Result Value Ref Range   Glucose-Capillary 84 70 - 99 mg/dL  Wet prep, genital     Status: Abnormal   Collection Time: 03/16/14  2:34 PM  Result Value Ref Range   Yeast Wet Prep HPF POC FEW (A) NONE SEEN   Trich, Wet Prep NONE SEEN NONE SEEN   Clue Cells Wet Prep HPF POC NONE SEEN NONE SEEN   WBC, Wet Prep HPF POC FEW (A) NONE SEEN     Assessment and Plan  14 4/7 wks with migraines Vaginal yeast    Consulted with Dr Ulanda Edison, pt to go back to her neurologist for migraine management Rx Phenergan to add to the Tylenol #3 until she is able to get in the office Terazol for vaginal yeast Small, frequent meals RTO as scheduled  Wanda Harron M. 03/16/2014, 2:30 PM

## 2014-03-16 NOTE — MAU Note (Signed)
Patient presents with complaints of dizziness, headache and abdominal pain X 3 weeks.

## 2014-03-16 NOTE — MAU Note (Signed)
Assumed care of patient.

## 2014-04-28 ENCOUNTER — Ambulatory Visit: Payer: Self-pay | Admitting: Neurology

## 2014-04-30 ENCOUNTER — Encounter: Payer: Self-pay | Admitting: Neurology

## 2014-06-07 ENCOUNTER — Encounter (HOSPITAL_COMMUNITY): Payer: Self-pay | Admitting: *Deleted

## 2014-06-07 ENCOUNTER — Inpatient Hospital Stay (HOSPITAL_COMMUNITY)
Admission: AD | Admit: 2014-06-07 | Discharge: 2014-06-07 | Disposition: A | Discharge status: Home / Self Care | Payer: Medicaid Other | Source: Ambulatory Visit | Attending: Obstetrics and Gynecology | Admitting: Obstetrics and Gynecology

## 2014-06-07 DIAGNOSIS — K5900 Constipation, unspecified: Secondary | ICD-10-CM

## 2014-06-07 DIAGNOSIS — O9989 Other specified diseases and conditions complicating pregnancy, childbirth and the puerperium: Secondary | ICD-10-CM | POA: Diagnosis not present

## 2014-06-07 DIAGNOSIS — R109 Unspecified abdominal pain: Secondary | ICD-10-CM | POA: Diagnosis present

## 2014-06-07 DIAGNOSIS — O99612 Diseases of the digestive system complicating pregnancy, second trimester: Secondary | ICD-10-CM | POA: Insufficient documentation

## 2014-06-07 DIAGNOSIS — K219 Gastro-esophageal reflux disease without esophagitis: Secondary | ICD-10-CM | POA: Insufficient documentation

## 2014-06-07 DIAGNOSIS — N949 Unspecified condition associated with female genital organs and menstrual cycle: Secondary | ICD-10-CM | POA: Diagnosis not present

## 2014-06-07 DIAGNOSIS — O26899 Other specified pregnancy related conditions, unspecified trimester: Secondary | ICD-10-CM

## 2014-06-07 DIAGNOSIS — Z3A26 26 weeks gestation of pregnancy: Secondary | ICD-10-CM | POA: Insufficient documentation

## 2014-06-07 DIAGNOSIS — R102 Pelvic and perineal pain: Secondary | ICD-10-CM

## 2014-06-07 LAB — COMPREHENSIVE METABOLIC PANEL
ALK PHOS: 43 U/L (ref 39–117)
ALT: 9 U/L (ref 0–35)
ANION GAP: 4 — AB (ref 5–15)
AST: 14 U/L (ref 0–37)
Albumin: 3.3 g/dL — ABNORMAL LOW (ref 3.5–5.2)
BUN: 6 mg/dL (ref 6–23)
CO2: 21 mmol/L (ref 19–32)
Calcium: 8.7 mg/dL (ref 8.4–10.5)
Chloride: 110 mmol/L (ref 96–112)
Creatinine, Ser: 0.46 mg/dL — ABNORMAL LOW (ref 0.50–1.10)
GFR calc Af Amer: >90 mL/min (ref >90)
GFR calc non Af Amer: >90 mL/min (ref >90)
Glucose, Bld: 83 mg/dL (ref 70–99)
Potassium: 3.8 mmol/L (ref 3.5–5.1)
Sodium: 135 mmol/L (ref 135–145)
TOTAL PROTEIN: 6.2 g/dL (ref 6.0–8.3)
Total Bilirubin: 0.5 mg/dL (ref 0.3–1.2)

## 2014-06-07 LAB — CBC
HCT: 31.6 % — ABNORMAL LOW (ref 36.0–46.0)
Hemoglobin: 11.3 g/dL — ABNORMAL LOW (ref 12.0–15.0)
MCH: 31.1 pg (ref 26.0–34.0)
MCHC: 35.8 g/dL (ref 30.0–36.0)
MCV: 87.1 fL (ref 78.0–100.0)
PLATELETS: 239 10*3/uL (ref 150–400)
RBC: 3.63 MIL/uL — ABNORMAL LOW (ref 3.87–5.11)
RDW: 13.8 % (ref 11.5–15.5)
WBC: 7.7 10*3/uL (ref 4.0–10.5)

## 2014-06-07 LAB — URINALYSIS, ROUTINE W REFLEX MICROSCOPIC
Bilirubin Urine: NEGATIVE
Glucose, UA: NEGATIVE mg/dL
Hgb urine dipstick: NEGATIVE
KETONES UR: NEGATIVE mg/dL
Leukocytes, UA: NEGATIVE
NITRITE: NEGATIVE
PROTEIN: NEGATIVE mg/dL
Specific Gravity, Urine: 1.02 (ref 1.005–1.030)
Urobilinogen, UA: 0.2 mg/dL (ref 0.0–1.0)
pH: 7.5 (ref 5.0–8.0)

## 2014-06-07 LAB — LIPASE, BLOOD: Lipase: 22 U/L (ref 11–59)

## 2014-06-07 LAB — AMYLASE: Amylase: 101 U/L (ref 0–105)

## 2014-06-07 MED ORDER — LACTATED RINGERS IV BOLUS (SEPSIS)
1000.0000 mL | Freq: Once | INTRAVENOUS | Status: AC
  Administered 2014-06-07: 1000 mL via INTRAVENOUS

## 2014-06-07 MED ORDER — METOCLOPRAMIDE HCL 10 MG PO TABS: 10.0000 mg | ORAL_TABLET | Freq: Three times a day (TID) | ORAL | Status: DC

## 2014-06-07 MED ORDER — FAMOTIDINE IN NACL 20-0.9 MG/50ML-% IV SOLN
20.0000 mg | Freq: Once | INTRAVENOUS | Status: AC
  Administered 2014-06-07: 20 mg via INTRAVENOUS
  Filled 2014-06-07: qty 50

## 2014-06-07 MED ORDER — METOCLOPRAMIDE HCL 5 MG/ML IJ SOLN
10.0000 mg | Freq: Once | INTRAMUSCULAR | Status: AC
  Administered 2014-06-07: 10 mg via INTRAVENOUS
  Filled 2014-06-07: qty 2

## 2014-06-07 NOTE — MAU Note (Signed)
Pt states here for intermittent lower abd pain. Has intermittent SOB when abdomen tightens. Also has had migraine for past week. No bleeding or lof. Also has had less movement.

## 2014-06-07 NOTE — MAU Provider Note (Signed)
History     CSN: [id number]  Arrival date and time: 06/07/14 0719   First Provider Initiated Contact with Patient 06/07/14 [phone]      Chief Complaint  Patient presents with  . Abdominal Pain   HPI  [name] is a 24 y.o. female G1P0 at 13w3dwho presents with various complaints. She was instructed by her Dr. To come into MAU for evaluation. She presents with  Upper and lower abdominal pain, pressure and tightening for 2 weeks. She is having a difficult time walking because the pain is so severe. She is also experiencing lower back pain and migraines. She has a history of migraines and feels that the migraines are worse now that she is pregnant.  She denies migraine at this time.   She has not been drinking a lot of fluid because she dosen't feel like she can drink much more.  She feels like she cannot consume a full meal because the food sits at the top of her abdomen. She has discussed this at her prenatal appointments and feels it may be a normal part of pregnancy, however she is miserable from it.   The patient had her cervix checked in the office yesterday and it was closed    OB History    Gravida Para Term Preterm AB TAB SAB Ectopic Multiple Living   1               Past Medical History  Diagnosis Date  . Asthma   . Anxiety   . Headache(784.0)   . Eczema     Past Surgical History  Procedure Laterality Date  . Mouth surgery    . Wisdom tooth extraction      Family History  Problem Relation Age of Onset  . Diabetes Maternal Grandmother   . Hypertension Maternal Grandmother   . Stroke Maternal Grandmother   . High blood pressure Mother   . Hypertension Mother     History  Substance Use Topics  . Smoking status: Never Smoker   . Smokeless tobacco: Never Used  . Alcohol Use: No    Allergies:  Allergies  Allergen Reactions  . Pulmicort [Budesonide] Palpitations    Racing heart  . Tomato Hives and Itching  . Other Hives    mushrooms     Prescriptions prior to admission  Medication Sig Dispense Refill Last Dose  . acetaminophen-codeine (TYLENOL #3) 300-30 MG per tablet Take 1 tablet by mouth every 6 (six) hours as needed for moderate pain.   Past Week at Unknown time  . albuterol (PROVENTIL HFA;VENTOLIN HFA) 108 (90 BASE) MCG/ACT inhaler Inhale 2 puffs into the lungs every 6 (six) hours as needed for wheezing or shortness of breath.   06/06/2014 at Unknown time  . Prenatal Vit-Fe Fumarate-FA (MULTIVITAMIN-PRENATAL) 27-0.8 MG TABS tablet Take 1 tablet by mouth daily at 12 noon. 30 each 11 Past Month at Unknown time  . Prenatal Vit-Min-FA-Fish Oil ([facility] PRENATAL GUMMY PO) Take 2 tablets by mouth daily.   Past Week at Unknown time  . promethazine (PHENERGAN) 25 MG tablet Take 1 tablet (25 mg total) by mouth every 6 (six) hours as needed for nausea or vomiting. 20 tablet 0 Past Week at Unknown time  . triamcinolone cream (KENALOG) 0.1 % Apply 1 application topically 2 (two) times daily as needed (exzema).   Past Month at Unknown time  . cyclobenzaprine (FLEXERIL) 10 MG tablet Take 1 tablet (10 mg total) by mouth 2 (two) times  daily as needed for muscle spasms. (Patient not taking: Reported on 03/16/2014) 20 tablet 0   . terconazole (TERAZOL 3) 0.8 % vaginal cream Place 1 applicator vaginally at bedtime. (Patient not taking: Reported on 06/07/2014) 20 g 0    Results for orders placed or performed during the hospital encounter of 06/07/14 (from the past 48 hour(s))  Urinalysis, Routine w reflex microscopic     Status: None   Collection Time: 06/07/14  7:39 AM  Result Value Ref Range   Color, Urine YELLOW YELLOW   APPearance CLEAR CLEAR   Specific Gravity, Urine 1.020 1.005 - 1.030   pH 7.5 5.0 - 8.0   Glucose, UA NEGATIVE NEGATIVE mg/dL   Hgb urine dipstick NEGATIVE NEGATIVE   Bilirubin Urine NEGATIVE NEGATIVE   Ketones, ur NEGATIVE NEGATIVE mg/dL   Protein, ur NEGATIVE NEGATIVE mg/dL   Urobilinogen, UA 0.2 0.0 - 1.0 mg/dL    Nitrite NEGATIVE NEGATIVE   Leukocytes, UA NEGATIVE NEGATIVE    Comment: MICROSCOPIC NOT DONE ON URINES WITH NEGATIVE PROTEIN, BLOOD, LEUKOCYTES, NITRITE, OR GLUCOSE <1000 mg/dL.  CBC     Status: Abnormal   Collection Time: 06/07/14  9:50 AM  Result Value Ref Range   WBC 7.7 4.0 - 10.5 K/uL   RBC 3.63 (L) 3.87 - 5.11 MIL/uL   Hemoglobin 11.3 (L) 12.0 - 15.0 g/dL   HCT 31.6 (L) 36.0 - 46.0 %   MCV 87.1 78.0 - 100.0 fL   MCH 31.1 26.0 - 34.0 pg   MCHC 35.8 30.0 - 36.0 g/dL   RDW 13.8 11.5 - 15.5 %   Platelets 239 150 - 400 K/uL  Comprehensive metabolic panel     Status: Abnormal   Collection Time: 06/07/14  9:50 AM  Result Value Ref Range   Sodium 135 135 - 145 mmol/L   Potassium 3.8 3.5 - 5.1 mmol/L   Chloride 110 96 - 112 mmol/L   CO2 21 19 - 32 mmol/L   Glucose, Bld 83 70 - 99 mg/dL   BUN 6 6 - 23 mg/dL   Creatinine, Ser 0.46 (L) 0.50 - 1.10 mg/dL   Calcium 8.7 8.4 - 10.5 mg/dL   Total Protein 6.2 6.0 - 8.3 g/dL   Albumin 3.3 (L) 3.5 - 5.2 g/dL   AST 14 0 - 37 U/L   ALT 9 0 - 35 U/L   Alkaline Phosphatase 43 39 - 117 U/L   Total Bilirubin 0.5 0.3 - 1.2 mg/dL   GFR calc non Af Amer >90 >90 mL/min   GFR calc Af Amer >90 >90 mL/min    Comment: (NOTE) The eGFR has been calculated using the CKD EPI equation. This calculation has not been validated in all clinical situations. eGFR's persistently <90 mL/min signify possible Chronic Kidney Disease.    Anion gap 4 (L) 5 - 15  Amylase     Status: None   Collection Time: 06/07/14  9:50 AM  Result Value Ref Range   Amylase 101 0 - 105 U/L    Comment: Performed at [facility]  Lipase, blood     Status: None   Collection Time: 06/07/14  9:50 AM  Result Value Ref Range   Lipase 22 11 - 59 U/L    Comment: Performed at [facility]    Review of Systems  Constitutional: Negative for fever and chills.  Gastrointestinal: Positive for nausea, vomiting and abdominal pain.   Physical Exam   Blood pressure 118/77,  pulse 99, temperature 98.7  F (37.1 C), resp. rate 18, height '4\' 11"'  (1.499 m), weight 62.596 kg (138 lb), last menstrual period 12/04/2013.  Physical Exam  Constitutional: She is oriented to person, place, and time. Vital signs are normal. She appears well-developed and well-nourished. She is cooperative. She appears distressed.  HENT:  Head: Normocephalic.  Cardiovascular: Normal rate and normal heart sounds.   Respiratory: Effort normal and breath sounds normal.  GI: Soft. There is no tenderness. There is no guarding.  Genitourinary:  Dilation: Closed Effacement (%): Thick Exam by:: J Rasch NP  Musculoskeletal: Normal range of motion.  Neurological: She is alert and oriented to person, place, and time. No sensory deficit. She exhibits abnormal muscle tone. GCS eye subscore is 4. GCS verbal subscore is 5. GCS motor subscore is 6.  Skin: Skin is warm.  Psychiatric: Her behavior is normal.   Fetal Tracing: Baseline: 140 bpm  Variability: Moderate  Accelerations: 15x15 Decelerations: None Toco: occasional UI    MAU Course  Procedures  None  MDM  NST  LR Reglan Pepcid  Discussed patient with Dr. Ulanda Edison CBC CMET Amylase; WNL  Lipase; WNL   Discussed patient with Dr. Ulanda Edison  Patient feels a lot better since IVF, reglan and pepcid. Patient was able to keep down breakfast while in MAU and does not feel the pressure in her upper abdomen.   Assessment and Plan   A:  Round ligament pain  GERD in pregnancy   P:  Discharge home Return to MAU if symptoms worsen RX: Reglan Small, frequent meals Pregnancy belt recommended  Change positions slowly.   Darrelyn Hillock Rasch, NP 06/07/2014 7:51 PM

## 2014-06-07 NOTE — Discharge Instructions (Signed)

## 2014-06-07 NOTE — MAU Note (Signed)
Pt refused cath u/a

## 2014-07-28 ENCOUNTER — Encounter (HOSPITAL_COMMUNITY): Payer: Self-pay | Admitting: *Deleted

## 2014-07-28 ENCOUNTER — Inpatient Hospital Stay (HOSPITAL_COMMUNITY)
Admission: AD | Admit: 2014-07-28 | Discharge: 2014-07-28 | Disposition: A | Discharge status: Home / Self Care | Payer: Medicaid Other | Source: Ambulatory Visit | Attending: Obstetrics and Gynecology | Admitting: Obstetrics and Gynecology

## 2014-07-28 ENCOUNTER — Inpatient Hospital Stay (HOSPITAL_COMMUNITY): Payer: Medicaid Other

## 2014-07-28 DIAGNOSIS — O26899 Other specified pregnancy related conditions, unspecified trimester: Secondary | ICD-10-CM

## 2014-07-28 DIAGNOSIS — J45909 Unspecified asthma, uncomplicated: Secondary | ICD-10-CM | POA: Insufficient documentation

## 2014-07-28 DIAGNOSIS — R5383 Other fatigue: Secondary | ICD-10-CM | POA: Insufficient documentation

## 2014-07-28 DIAGNOSIS — Z3A33 33 weeks gestation of pregnancy: Secondary | ICD-10-CM | POA: Insufficient documentation

## 2014-07-28 DIAGNOSIS — O288 Other abnormal findings on antenatal screening of mother: Secondary | ICD-10-CM

## 2014-07-28 DIAGNOSIS — O9989 Other specified diseases and conditions complicating pregnancy, childbirth and the puerperium: Secondary | ICD-10-CM | POA: Diagnosis not present

## 2014-07-28 DIAGNOSIS — R1011 Right upper quadrant pain: Secondary | ICD-10-CM | POA: Insufficient documentation

## 2014-07-28 DIAGNOSIS — R109 Unspecified abdominal pain: Secondary | ICD-10-CM | POA: Diagnosis not present

## 2014-07-28 DIAGNOSIS — O99513 Diseases of the respiratory system complicating pregnancy, third trimester: Secondary | ICD-10-CM | POA: Diagnosis not present

## 2014-07-28 DIAGNOSIS — R0602 Shortness of breath: Secondary | ICD-10-CM | POA: Diagnosis present

## 2014-07-28 NOTE — MAU Provider Note (Signed)
History     CSN: [id number]  Arrival date and time: 07/28/14 3716   First Provider Initiated Contact with Patient 07/28/14 1708      Chief Complaint  Patient presents with  . Shortness of Breath  . Fatigue  . Abdominal Pain   HPI  [name] is a 24 y.o. G1P0 at [redacted]w[redacted]d who presents to MAU today with multiple complaints. The patient states a sharp pain in right ribs x 2 weeks. She states that the pain and baby position make it difficult for her to take a deep breath. She rates her pain at 7/10 at the worst. She has tried tylenol with minimal relief. She denies SOB or chest pain. She states occasional mild contractions. She denies vaginal bleeding, abnormal discharge, UTI symptoms or headache. She states a continued clear discharge that was evaluated in the office and per patient "was just discharge." She reports good fetal movement. Patient also endorses mild fatigue and states that she was told that her iron was "a little low." She denies weakness or dizziness.   OB History    Gravida Para Term Preterm AB TAB SAB Ectopic Multiple Living   1               Past Medical History  Diagnosis Date  . Asthma   . Anxiety   . Headache(784.0)   . Eczema     Past Surgical History  Procedure Laterality Date  . Mouth surgery    . Wisdom tooth extraction    . Tympanostomy tube placement      Family History  Problem Relation Age of Onset  . Diabetes Maternal Grandmother   . Hypertension Maternal Grandmother   . Stroke Maternal Grandmother   . High blood pressure Mother   . Hypertension Mother     History  Substance Use Topics  . Smoking status: Never Smoker   . Smokeless tobacco: Never Used  . Alcohol Use: No    Allergies:  Allergies  Allergen Reactions  . Pulmicort [Budesonide] Palpitations    Racing heart  . Tomato Hives and Itching  . Other Hives    mushrooms    Prescriptions prior to admission  Medication Sig Dispense Refill Last Dose  .  albuterol (PROVENTIL HFA;VENTOLIN HFA) 108 (90 BASE) MCG/ACT inhaler Inhale 2 puffs into the lungs every 6 (six) hours as needed for wheezing or shortness of breath.   Past Week at Unknown time  . ARTIFICIAL TEAR OP Apply 1 drop to eye 3 (three) times daily as needed (dry eyes).   07/28/2014 at Unknown time  . metoCLOPramide (REGLAN) 10 MG tablet Take 1 tablet (10 mg total) by mouth 4 (four) times daily -  before meals and at bedtime. 120 tablet 1 Past Week at Unknown time  . Prenatal Vit-Min-FA-Fish Oil ([facility] PRENATAL GUMMY PO) Take 2 tablets by mouth daily.   07/28/2014 at Unknown time  . triamcinolone cream (KENALOG) 0.1 % Apply 1 application topically 2 (two) times daily as needed (eczema).    07/27/2014 at Unknown time  . acetaminophen-codeine (TYLENOL #3) 300-30 MG per tablet Take 1 tablet by mouth every 6 (six) hours as needed for moderate pain.   more than one month  . Prenatal Vit-Fe Fumarate-FA (MULTIVITAMIN-PRENATAL) 27-0.8 MG TABS tablet Take 1 tablet by mouth daily at 12 noon. (Patient not taking: Reported on 07/28/2014) 30 each 11 Past Month at Unknown time  . promethazine (PHENERGAN) 25 MG tablet Take 1 tablet (25 mg total) by  mouth every 6 (six) hours as needed for nausea or vomiting. (Patient not taking: Reported on 07/28/2014) 20 tablet 0 more than one month    Review of Systems  Constitutional: Negative for fever and malaise/fatigue.  Eyes: Positive for blurred vision.  Gastrointestinal: Positive for abdominal pain.  Genitourinary: Negative for dysuria, urgency and frequency.       Neg - vaginal bleeding + vaginal discharge  Neurological: Negative for headaches.   Physical Exam   Blood pressure 122/78, pulse 104, temperature 98.2 F (36.8 C), temperature source Oral, resp. rate 16, height 4' 10.5" (1.486 m), weight 143 lb (64.864 kg), last menstrual period 12/04/2013, SpO2 100 %.  Physical Exam  Constitutional: She is oriented to person, place, and time. She appears  well-developed and well-nourished. No distress.  HENT:  Head: Normocephalic.  Cardiovascular: Normal rate.   Respiratory: Effort normal.  GI: Soft. She exhibits no distension and no mass. There is no tenderness. There is no rebound and no guarding.  Neurological: She is alert and oriented to person, place, and time.  Skin: Skin is warm and dry. No erythema.  Psychiatric: She has a normal mood and affect.   Fetal Monitoring: Baseline: 130 bpm, moderate variability, + accelerations, no decelerations Contractions: none  MAU Course  Procedures None  MDM Blood pressure is normal Discussed patient with Dr. Ulanda Edison. Recommends BPP for non-reactive NST and follow-up in the office. Advised we discuss warning signs for cholelithiasis with patient.  BPP - 8/8 on preliminary report and NST improved just prior to leaving MAU for [place].  Assessment and Plan  A: [facility] at [redacted]w[redacted]d RUQ pain  P: Discharge home Labor precautions discussed Warning signs for cholelithiasis and cholecystitis discussed Patient advised to try Pepcid OTC for reflex and indigestion and eat small frequent meals Tylenol advised PRN for pain Patient may return to MAU as needed or if her condition were to change or worsen   Luvenia Redden, PA-C  07/28/2014, 7:14 PM

## 2014-07-28 NOTE — MAU Note (Signed)
Pt. Urine in lab 

## 2014-07-28 NOTE — Discharge Instructions (Signed)
Abdominal Pain During Pregnancy °Belly (abdominal) pain is common during pregnancy. Most of the time, it is not a serious problem. Other times, it can be a sign that something is wrong with the pregnancy. Always tell your doctor if you have belly pain. °HOME CARE °Monitor your belly pain for any changes. The following actions may help you feel better: °· Do not have sex (intercourse) or put anything in your vagina until you feel better. °· Rest until your pain stops. °· Drink clear fluids if you feel sick to your stomach (nauseous). Do not eat solid food until you feel better. °· Only take medicine as told by your doctor. °· Keep all doctor visits as told. °GET HELP RIGHT AWAY IF:  °· You are bleeding, leaking fluid, or pieces of tissue come out of your vagina. °· You have more pain or cramping. °· You keep throwing up (vomiting). °· You have pain when you pee (urinate) or have blood in your pee. °· You have a fever. °· You do not feel your baby moving as much. °· You feel very weak or feel like passing out. °· You have trouble breathing, with or without belly pain. °· You have a very bad headache and belly pain. °· You have fluid leaking from your vagina and belly pain. °· You keep having watery poop (diarrhea). °· Your belly pain does not go away after resting, or the pain gets worse. °MAKE SURE YOU:  °· Understand these instructions. °· Will watch your condition. °· Will get help right away if you are not doing well or get worse. °Document Released: 04/06/2009 Document Revised: 12/19/2012 Document Reviewed: 11/15/2012 °[employer]® Patient Information ©2015 [employer]. This information is not intended to replace advice given to you by your health care provider. Make sure you discuss any questions you have with your health care provider. ° °

## 2014-07-28 NOTE — MAU Note (Signed)
Feels like baby is high, almost in ribs, making it hard to breath, feels short of breath at times. Has asthma- that seems to worse.  Last few days has felt sluggish, at her last appt was told her iron was a little low. Having pain in RUQ and ribs is hurting.

## 2014-07-29 DIAGNOSIS — O288 Other abnormal findings on antenatal screening of mother: Secondary | ICD-10-CM | POA: Insufficient documentation

## 2014-07-29 DIAGNOSIS — Z3A33 33 weeks gestation of pregnancy: Secondary | ICD-10-CM | POA: Insufficient documentation

## 2014-08-06 ENCOUNTER — Inpatient Hospital Stay (HOSPITAL_COMMUNITY)
Admission: AD | Admit: 2014-08-06 | Discharge: 2014-08-06 | Disposition: A | Discharge status: Home / Self Care | Payer: Medicaid Other | Source: Ambulatory Visit | Attending: Obstetrics and Gynecology | Admitting: Obstetrics and Gynecology

## 2014-08-06 ENCOUNTER — Encounter (HOSPITAL_COMMUNITY): Payer: Self-pay | Admitting: Emergency Medicine

## 2014-08-06 DIAGNOSIS — O212 Late vomiting of pregnancy: Secondary | ICD-10-CM | POA: Insufficient documentation

## 2014-08-06 DIAGNOSIS — Z3A35 35 weeks gestation of pregnancy: Secondary | ICD-10-CM | POA: Diagnosis not present

## 2014-08-06 DIAGNOSIS — O26893 Other specified pregnancy related conditions, third trimester: Secondary | ICD-10-CM

## 2014-08-06 DIAGNOSIS — R42 Dizziness and giddiness: Secondary | ICD-10-CM | POA: Diagnosis not present

## 2014-08-06 DIAGNOSIS — R51 Headache: Secondary | ICD-10-CM | POA: Insufficient documentation

## 2014-08-06 DIAGNOSIS — R519 Headache, unspecified: Secondary | ICD-10-CM

## 2014-08-06 DIAGNOSIS — O219 Vomiting of pregnancy, unspecified: Secondary | ICD-10-CM

## 2014-08-06 LAB — URINALYSIS, ROUTINE W REFLEX MICROSCOPIC
Bilirubin Urine: NEGATIVE
Glucose, UA: NEGATIVE mg/dL
Hgb urine dipstick: NEGATIVE
Ketones, ur: NEGATIVE mg/dL
Nitrite: NEGATIVE
PH: 6.5 (ref 5.0–8.0)
Protein, ur: NEGATIVE mg/dL
Urobilinogen, UA: 0.2 mg/dL (ref 0.0–1.0)

## 2014-08-06 LAB — PROTEIN / CREATININE RATIO, URINE
CREATININE, URINE: 38 mg/dL
Total Protein, Urine: <6 mg/dL

## 2014-08-06 LAB — COMPREHENSIVE METABOLIC PANEL
ALK PHOS: 79 U/L (ref 39–117)
ALT: 10 U/L (ref 0–35)
ANION GAP: 7 (ref 5–15)
AST: 21 U/L (ref 0–37)
Albumin: 3.6 g/dL (ref 3.5–5.2)
BUN: 5 mg/dL — AB (ref 6–23)
CO2: 23 mmol/L (ref 19–32)
Calcium: 8.8 mg/dL (ref 8.4–10.5)
Chloride: 107 mmol/L (ref 96–112)
Creatinine, Ser: 0.54 mg/dL (ref 0.50–1.10)
GFR calc Af Amer: >90 mL/min (ref >90)
GFR calc non Af Amer: >90 mL/min (ref >90)
Glucose, Bld: 80 mg/dL (ref 70–99)
POTASSIUM: 3.9 mmol/L (ref 3.5–5.1)
SODIUM: 137 mmol/L (ref 135–145)
TOTAL PROTEIN: 7.2 g/dL (ref 6.0–8.3)
Total Bilirubin: 0.3 mg/dL (ref 0.3–1.2)

## 2014-08-06 LAB — CBC
HCT: 31.1 % — ABNORMAL LOW (ref 36.0–46.0)
HEMOGLOBIN: 10.9 g/dL — AB (ref 12.0–15.0)
MCH: 29.5 pg (ref 26.0–34.0)
MCHC: 35 g/dL (ref 30.0–36.0)
MCV: 84.1 fL (ref 78.0–100.0)
Platelets: 277 10*3/uL (ref 150–400)
RBC: 3.7 MIL/uL — ABNORMAL LOW (ref 3.87–5.11)
RDW: 13.6 % (ref 11.5–15.5)
WBC: 7.4 10*3/uL (ref 4.0–10.5)

## 2014-08-06 LAB — WET PREP, GENITAL
Clue Cells Wet Prep HPF POC: NONE SEEN
TRICH WET PREP: NONE SEEN
YEAST WET PREP: NONE SEEN

## 2014-08-06 LAB — URINE MICROSCOPIC-ADD ON

## 2014-08-06 LAB — LACTATE DEHYDROGENASE: LDH: 149 U/L (ref 94–250)

## 2014-08-06 LAB — URIC ACID: Uric Acid, Serum: 4.3 mg/dL (ref 2.4–7.0)

## 2014-08-06 MED ORDER — PROMETHAZINE HCL 25 MG PO TABS
25.0000 mg | ORAL_TABLET | Freq: Once | ORAL | Status: AC
  Administered 2014-08-06: 25 mg via ORAL
  Filled 2014-08-06: qty 1

## 2014-08-06 MED ORDER — PROMETHAZINE HCL 25 MG PO TABS: 12.5000 mg | ORAL_TABLET | Freq: Four times a day (QID) | ORAL | Status: DC | PRN

## 2014-08-06 NOTE — MAU Provider Note (Signed)
Chief Complaint:  Emesis and Headache   First Provider Initiated Contact with Patient 08/06/14 1415      HPI: Wanda Nixon is a 24 y.o. G1P0 at [redacted]w[redacted]d who presents to maternity admissions reporting n/v daily, weakness and dizziness, h/a, elevated BP at [facility] yesterday, and vaginal discharge with itching.  She reports she has had n/v throughout pregnancy, but worse in last 2 weeks so she feels hungry but is unable to keep down any food. She does drink water and is able to keep down most fluids.  She reports h/a x 2-3 days, sometimes severe but usually mild and go away without medication. She thinks her h/a's are related to decreased food intake.  She feels dizzy off and on, and yesterday was dizzy every time she stood up, causing her to cry because it was so uncomfortable for her.  She had Phenergan Rx earlier in pregnancy but is out of medication now. It did help when she took it.  She was told she had low iron at her 30 week visit and wonders if this is why she feels so tired and dizzy.   She reports vaginal itching throughout the pregnancy and is using soap to wash the area more than once/day.  She has increased white discharge and more itching today than normal. She reports good fetal movement, denies LOF, vaginal bleeding, vaginal itching/burning, urinary symptoms, h/a, dizziness, n/v, or fever/chills.    Emesis  This is a recurrent problem. The current episode started more than 1 month ago. The problem occurs 2 to 4 times per day. The problem has been gradually worsening. The emesis has an appearance of stomach contents. There has been no fever. Associated symptoms include dizziness and headaches. She has tried increased fluids (Phenergan) for the symptoms. The treatment provided moderate relief.  Headache  This is a new problem. The current episode started in the past 7 days. The problem occurs intermittently. The problem has been waxing and waning. The pain is located in the  bilateral and frontal region. The pain does not radiate. The pain quality is not similar to prior headaches. The quality of the pain is described as aching. The pain is at a severity of 6/10. The pain is moderate. Associated symptoms include dizziness and vomiting. The symptoms are aggravated by hunger. She has tried acetaminophen (increased PO fluids) for the symptoms. The treatment provided moderate relief.  Vaginal Bleeding The patient's primary symptoms include vaginal discharge. This is a recurrent problem. The current episode started in the past 7 days. The problem occurs constantly. The problem has been unchanged. The patient is experiencing no pain. She is pregnant. Associated symptoms include headaches and vomiting. The vaginal discharge was white and thin. There has been no bleeding. She has tried nothing for the symptoms. No, her partner does not have an STD.    Past Medical History: Past Medical History  Diagnosis Date  . Asthma   . Anxiety   . Headache(784.0)   . Eczema     Past obstetric history: OB History  Gravida Para Term Preterm AB SAB TAB Ectopic Multiple Living  1             # Outcome Date GA Lbr Len/2nd Weight Sex Delivery Anes PTL Lv  1 Current               Past Surgical History: Past Surgical History  Procedure Laterality Date  . Mouth surgery    . Wisdom tooth extraction    .  Tympanostomy tube placement      Family History: Family History  Problem Relation Age of Onset  . Diabetes Maternal Grandmother   . Hypertension Maternal Grandmother   . Stroke Maternal Grandmother   . High blood pressure Mother   . Hypertension Mother     Social History: History  Substance Use Topics  . Smoking status: Never Smoker   . Smokeless tobacco: Never Used  . Alcohol Use: No    Allergies:  Allergies  Allergen Reactions  . Pulmicort [Budesonide] Palpitations    Racing heart  . Tomato Hives and Itching  . Other Hives    mushrooms    Meds:   Prescriptions prior to admission  Medication Sig Dispense Refill Last Dose  . acetaminophen (TYLENOL) 500 MG tablet Take 500 mg by mouth every 6 (six) hours as needed for moderate pain.   08/05/2014 at Unknown time  . Prenatal Vit-Min-FA-Fish Oil ([facility] PRENATAL GUMMY PO) Take 2 tablets by mouth daily.   08/06/2014 at Unknown time  . metoCLOPramide (REGLAN) 10 MG tablet Take 1 tablet (10 mg total) by mouth 4 (four) times daily -  before meals and at bedtime. (Patient not taking: Reported on 08/06/2014) 120 tablet 1 Past Week at Unknown time  . promethazine (PHENERGAN) 25 MG tablet Take 1 tablet (25 mg total) by mouth every 6 (six) hours as needed for nausea or vomiting. (Patient not taking: Reported on 08/06/2014) 20 tablet 0 more than one month    ROS: Pertinent findings in history of present illness.  Physical Exam  Blood pressure 123/79, pulse 97, temperature 98.3 F (36.8 C), temperature source Oral, resp. rate 18, height 4' 10.5" (1.486 m), weight 65.318 kg (144 lb), last menstrual period 12/04/2013. GENERAL: Well-developed, well-nourished female in no acute distress.  HEENT: normocephalic HEART: normal rate RESP: normal effort ABDOMEN: Soft, non-tender, gravid appropriate for gestational age EXTREMITIES: Nontender, no edema NEURO: alert and oriented Pelvic exam: Cervix pink, visually closed, without lesion, scant white creamy discharge, vaginal walls and external genitalia normal     FHT:  Baseline 140, moderate variability, accelerations present, no decelerations Contractions: Rare, mild to palpation   Labs: Results for orders placed or performed during the hospital encounter of 08/06/14 (from the past 24 hour(s))  Protein / creatinine ratio, urine     Status: None   Collection Time: 08/06/14 12:58 PM  Result Value Ref Range   Creatinine, Urine 38.00 mg/dL   Total Protein, Urine <6 mg/dL   Protein Creatinine Ratio        0.00 - 0.15  Urinalysis, Routine w reflex microscopic      Status: Abnormal   Collection Time: 08/06/14 12:58 PM  Result Value Ref Range   Color, Urine YELLOW YELLOW   APPearance CLOUDY (A) CLEAR   Specific Gravity, Urine <1.005 (L) 1.005 - 1.030   pH 6.5 5.0 - 8.0   Glucose, UA NEGATIVE NEGATIVE mg/dL   Hgb urine dipstick NEGATIVE NEGATIVE   Bilirubin Urine NEGATIVE NEGATIVE   Ketones, ur NEGATIVE NEGATIVE mg/dL   Protein, ur NEGATIVE NEGATIVE mg/dL   Urobilinogen, UA 0.2 0.0 - 1.0 mg/dL   Nitrite NEGATIVE NEGATIVE   Leukocytes, UA MODERATE (A) NEGATIVE  Urine microscopic-add on     Status: Abnormal   Collection Time: 08/06/14 12:58 PM  Result Value Ref Range   Squamous Epithelial / LPF FEW (A) RARE   WBC, UA 0-2 <3 WBC/hpf   Bacteria, UA FEW (A) RARE  CBC     Status:  Abnormal   Collection Time: 08/06/14  2:15 PM  Result Value Ref Range   WBC 7.4 4.0 - 10.5 K/uL   RBC 3.70 (L) 3.87 - 5.11 MIL/uL   Hemoglobin 10.9 (L) 12.0 - 15.0 g/dL   HCT 31.1 (L) 36.0 - 46.0 %   MCV 84.1 78.0 - 100.0 fL   MCH 29.5 26.0 - 34.0 pg   MCHC 35.0 30.0 - 36.0 g/dL   RDW 13.6 11.5 - 15.5 %   Platelets 277 150 - 400 K/uL  Comprehensive metabolic panel     Status: Abnormal   Collection Time: 08/06/14  2:15 PM  Result Value Ref Range   Sodium 137 135 - 145 mmol/L   Potassium 3.9 3.5 - 5.1 mmol/L   Chloride 107 96 - 112 mmol/L   CO2 23 19 - 32 mmol/L   Glucose, Bld 80 70 - 99 mg/dL   BUN 5 (L) 6 - 23 mg/dL   Creatinine, Ser 0.54 0.50 - 1.10 mg/dL   Calcium 8.8 8.4 - 10.5 mg/dL   Total Protein 7.2 6.0 - 8.3 g/dL   Albumin 3.6 3.5 - 5.2 g/dL   AST 21 0 - 37 U/L   ALT 10 0 - 35 U/L   Alkaline Phosphatase 79 39 - 117 U/L   Total Bilirubin 0.3 0.3 - 1.2 mg/dL   GFR calc non Af Amer >90 >90 mL/min   GFR calc Af Amer >90 >90 mL/min   Anion gap 7 5 - 15  Uric acid     Status: None   Collection Time: 08/06/14  2:15 PM  Result Value Ref Range   Uric Acid, Serum 4.3 2.4 - 7.0 mg/dL  Lactate dehydrogenase     Status: None   Collection Time: 08/06/14   2:15 PM  Result Value Ref Range   LDH 149 94 - 250 U/L  Wet prep, genital     Status: Abnormal   Collection Time: 08/06/14  2:20 PM  Result Value Ref Range   Yeast Wet Prep HPF POC NONE SEEN NONE SEEN   Trich, Wet Prep NONE SEEN NONE SEEN   Clue Cells Wet Prep HPF POC NONE SEEN NONE SEEN   WBC, Wet Prep HPF POC FEW (A) NONE SEEN    Imaging:  US Fetal Bpp W/o Non Stress  07/29/2014   OBSTETRICAL ULTRASOUND: This exam was performed within a [facility]. The OB US report was generated in the [facility] system, and faxed to the ordering physician.   This report is available in the [facility]. See the [facility] Obstetric US report via the Image Link.  ED Course Phenergan 25 mg PO given in MAU, pt tolerated PO fluids and food after medication  Assessment: 1. Nausea/vomiting in pregnancy   2. Headache in pregnancy, antepartum, third trimester     Plan: Consult Dr Marvel Plan Discharge home Phenergan 12.5-25 mg Q 6 hours Discussed increasing iron-rich foods with pt, given printed materials Recommend using mild soap only and limit washing of vaginal area.  Try probiotics including yogurt daily.         Follow-up Information    Follow up with Logan Bores, MD.   Specialty:  Obstetrics and Gynecology   Why:  As scheduled   Contact information:   [address] [address] [phone]       Follow up with [facility].   Why:  As needed for emergencies   Contact information:  [address] [id number] [place] [phone]       Medication List    TAKE these medications        acetaminophen 500 MG tablet  Commonly known as:  TYLENOL  Take 500 mg by mouth every 6 (six) hours as needed for moderate pain.     [facility] PRENATAL GUMMY PO  Take 2 tablets by mouth daily.     metoCLOPramide 10 MG tablet  Commonly known as:  REGLAN  Take 1 tablet (10 mg total) by mouth  4 (four) times daily -  before meals and at bedtime.     promethazine 25 MG tablet  Commonly known as:  PHENERGAN  Take 0.5-1 tablets (12.5-25 mg total) by mouth every 6 (six) hours as needed.        Fatima Blank Certified Nurse-Midwife 08/06/2014 4:33 PM

## 2014-08-06 NOTE — Discharge Instructions (Signed)
Morning Sickness Morning sickness is when you feel sick to your stomach (nauseous) during pregnancy. This nauseous feeling may or may not come with vomiting. It often occurs in the morning but can be a problem any time of day. Morning sickness is most common during the first trimester, but it may continue throughout pregnancy. While morning sickness is unpleasant, it is usually harmless unless you develop severe and continual vomiting (hyperemesis gravidarum). This condition requires more intense treatment.  CAUSES  The cause of morning sickness is not completely known but seems to be related to normal hormonal changes that occur in pregnancy. RISK FACTORS You are at greater risk if you:  Experienced nausea or vomiting before your pregnancy.  Had morning sickness during a previous pregnancy.  Are pregnant with more than one baby, such as twins. TREATMENT  Do not use any medicines (prescription, over-the-counter, or herbal) for morning sickness without first talking to your health care provider. Your health care provider may prescribe or recommend:  Vitamin B6 supplements.  Anti-nausea medicines.  The herbal medicine ginger. HOME CARE INSTRUCTIONS   Only take over-the-counter or prescription medicines as directed by your health care provider.  Taking multivitamins before getting pregnant can prevent or decrease the severity of morning sickness in most women.  Eat a piece of dry toast or unsalted crackers before getting out of bed in the morning.  Eat five or six small meals a day.  Eat dry and bland foods (rice, baked potato). Foods high in carbohydrates are often helpful.  Do not drink liquids with your meals. Drink liquids between meals.  Avoid greasy, fatty, and spicy foods.  Get someone to cook for you if the smell of any food causes nausea and vomiting.  If you feel nauseous after taking prenatal vitamins, take the vitamins at night or with a snack.  Snack on protein  foods (nuts, yogurt, cheese) between meals if you are hungry.  Eat unsweetened gelatins for desserts.  Wearing an acupressure wristband (worn for sea sickness) may be helpful.  Acupuncture may be helpful.  Do not smoke.  Get a humidifier to keep the air in your house free of odors.  Get plenty of fresh air. SEEK MEDICAL CARE IF:   Your home remedies are not working, and you need medicine.  You feel dizzy or lightheaded.  You are losing weight. SEEK IMMEDIATE MEDICAL CARE IF:   You have persistent and uncontrolled nausea and vomiting.  You pass out (faint). MAKE SURE YOU:  Understand these instructions.  Will watch your condition.  Will get help right away if you are not doing well or get worse. Document Released: 06/09/2006 Document Revised: 04/23/2013 Document Reviewed: 10/03/2012 [facility] Patient Information 2015 [place], [place]. This information is not intended to replace advice given to you by your health care provider. Make sure you discuss any questions you have with your health care provider. Iron-Rich Diet An iron-rich diet contains foods that are good sources of iron. Iron is an important mineral that helps your body produce hemoglobin. Hemoglobin is a protein in red blood cells that carries oxygen to the body's tissues. Sometimes, the iron level in your blood can be low. This may be caused by:  A lack of iron in your diet.  Blood loss.  Times of growth, such as during pregnancy or during a child's growth and development. Low levels of iron can cause a decrease in the number of red blood cells. This can result in iron deficiency anemia. Iron deficiency anemia  symptoms include:  Tiredness.  Weakness.  Irritability.  Increased chance of infection. Here are some recommendations for daily iron intake:  Males older than 23 years of age need 8 mg of iron per day.  Women ages 31 to 65 need 18 mg of iron per day.  Pregnant women need 27 mg of iron per day,  and women who are over 12 years of age and breastfeeding need 9 mg of iron per day.  Women over the age of 43 need 8 mg of iron per day. SOURCES OF IRON There are 2 types of iron that are found in food: heme iron and nonheme iron. Heme iron is absorbed by the body better than nonheme iron. Heme iron is found in meat, poultry, and fish. Nonheme iron is found in grains, beans, and vegetables. Heme Iron Sources Food / Iron (mg)  Chicken liver, 3 oz (85 g)/ 10 mg  Beef liver, 3 oz (85 g)/ 5.5 mg  Oysters, 3 oz (85 g)/ 8 mg  Beef, 3 oz (85 g)/ 2 to 3 mg  Shrimp, 3 oz (85 g)/ 2.8 mg  [place], 3 oz (85 g)/ 2 mg  Chicken, 3 oz (85 g) / 1 mg  Fish (tuna, halibut), 3 oz (85 g)/ 1 mg  Pork, 3 oz (85 g)/ 0.9 mg Nonheme Iron Sources Food / Iron (mg)  Ready-to-eat breakfast cereal, iron-fortified / 3.9 to 7 mg  Tofu,  cup / 3.4 mg  Kidney beans,  cup / 2.6 mg  Baked potato with skin / 2.7 mg  Asparagus,  cup / 2.2 mg  Avocado / 2 mg  Dried peaches,  cup / 1.6 mg  Raisins,  cup / 1.5 mg  Soy milk, 1 cup / 1.5 mg  Whole-wheat bread, 1 slice / 1.2 mg  Spinach, 1 cup / 0.8 mg  Broccoli,  cup / 0.6 mg IRON ABSORPTION Certain foods can decrease the body's absorption of iron. Try to avoid these foods and beverages while eating meals with iron-containing foods:  Coffee.  Tea.  Fiber.  Soy. Foods containing vitamin C can help increase the amount of iron your body absorbs from iron sources, especially from nonheme sources. Eat foods with vitamin C along with iron-containing foods to increase your iron absorption. Foods that are high in vitamin C include many fruits and vegetables. Some good sources are:  Fresh orange juice.  Oranges.  Strawberries.  Mangoes.  Grapefruit.  Red bell peppers.  Green bell peppers.  Broccoli.  Potatoes with skin.  Tomato juice. Document Released: 11/30/2004 Document Revised: 07/11/2011 Document Reviewed: 10/07/2010 [facility]  Patient Information 2015 [place], [place]. This information is not intended to replace advice given to you by your health care provider. Make sure you discuss any questions you have with your health care provider.

## 2014-08-06 NOTE — MAU Note (Signed)
checked BP at [facility] yesterday, top number low, bottom # and pulse were up.  Hasn't really been able to eat, or keep down food.  No one at home is sick.  Been having headache past 2 days, tried Tylenol, didn't really  Help.

## 2014-08-06 NOTE — Progress Notes (Signed)
Pt. Gave permission for a work note for Husband Lytle Michaels).

## 2014-08-06 NOTE — MAU Note (Signed)
Urine in lab 

## 2014-08-07 LAB — OB RESULTS CONSOLE GBS: STREP GROUP B AG: NEGATIVE

## 2014-08-19 ENCOUNTER — Inpatient Hospital Stay (HOSPITAL_COMMUNITY)
Admission: AD | Admit: 2014-08-19 | Discharge: 2014-08-19 | Disposition: A | Discharge status: Home / Self Care | Payer: Medicaid Other | Source: Ambulatory Visit | Attending: Obstetrics and Gynecology | Admitting: Obstetrics and Gynecology

## 2014-08-19 ENCOUNTER — Encounter (HOSPITAL_COMMUNITY): Payer: Self-pay | Admitting: *Deleted

## 2014-08-19 ENCOUNTER — Inpatient Hospital Stay (HOSPITAL_COMMUNITY): Payer: Medicaid Other

## 2014-08-19 DIAGNOSIS — O99519 Diseases of the respiratory system complicating pregnancy, unspecified trimester: Secondary | ICD-10-CM

## 2014-08-19 DIAGNOSIS — N898 Other specified noninflammatory disorders of vagina: Secondary | ICD-10-CM

## 2014-08-19 DIAGNOSIS — Z3A36 36 weeks gestation of pregnancy: Secondary | ICD-10-CM | POA: Insufficient documentation

## 2014-08-19 DIAGNOSIS — Z3A37 37 weeks gestation of pregnancy: Secondary | ICD-10-CM | POA: Insufficient documentation

## 2014-08-19 DIAGNOSIS — J45909 Unspecified asthma, uncomplicated: Secondary | ICD-10-CM

## 2014-08-19 DIAGNOSIS — O288 Other abnormal findings on antenatal screening of mother: Secondary | ICD-10-CM | POA: Insufficient documentation

## 2014-08-19 DIAGNOSIS — O26893 Other specified pregnancy related conditions, third trimester: Secondary | ICD-10-CM

## 2014-08-19 LAB — POCT FERN TEST: POCT Fern Test: NEGATIVE

## 2014-08-19 NOTE — MAU Note (Addendum)
been having contractions for a few days, maybe every 5 min now. Lots of pressure.  No bleeding.  ? Leaking, small wet spot on bed when got up, noted a little wetness since but no big gush.

## 2014-08-19 NOTE — MAU Provider Note (Signed)
Chief Complaint:  Labor Eval  First Provider Initiated Contact with Patient 08/19/14 1053      HPI: Wanda Nixon is a 24 y.o. G1P0 at [redacted]w[redacted]d who presents to maternity admissions reporting noticing a small wet spot on the bed at 4 AM. None since. Thinks that the fluid she leaked was clear. Also reports irregular contractions and increased pelvic pressure. States she was 1 cm in the office last week.  Has had headaches since last night that she rates 6/10 on a pain scale. Not bad enough to take anything for it. Identical to chronic headaches. Not alleviated by rest. Describes headache is generalized, throbbing. Denies vision changes or epigastric pain. No issues with high blood pressure this pregnancy.  Also reports increases inhaler use the past few weeks. Needing it ~4 x per week, several times in the past day. No wheezing or shortness of breath now. Has not discussed these concerns with her primary care provider who manages her asthma.  Denies fever, chills, vaginal bleeding. Good fetal movement.   Past Medical History: Past Medical History  Diagnosis Date  . Asthma   . Anxiety   . Headache(784.0)   . Eczema     Past obstetric history: OB History  Gravida Para Term Preterm AB SAB TAB Ectopic Multiple Living  1             # Outcome Date GA Lbr Len/2nd Weight Sex Delivery Anes PTL Lv  1 Current               Past Surgical History: Past Surgical History  Procedure Laterality Date  . Mouth surgery    . Wisdom tooth extraction    . Tympanostomy tube placement       Family History: Family History  Problem Relation Age of Onset  . Diabetes Maternal Grandmother   . Hypertension Maternal Grandmother   . Stroke Maternal Grandmother   . High blood pressure Mother   . Hypertension Mother     Social History: History  Substance Use Topics  . Smoking status: Never Smoker   . Smokeless tobacco: Never Used  . Alcohol Use: No    Allergies:  Allergies  Allergen  Reactions  . Pulmicort [Budesonide] Palpitations    Racing heart  . Tomato Hives and Itching  . Other Hives    mushrooms    Meds:  Prescriptions prior to admission  Medication Sig Dispense Refill Last Dose  . acetaminophen (TYLENOL) 500 MG tablet Take 500 mg by mouth every 6 (six) hours as needed for moderate pain.   Past Week at Unknown time  . albuterol (PROVENTIL HFA;VENTOLIN HFA) 108 (90 BASE) MCG/ACT inhaler Inhale 1-2 puffs into the lungs every 6 (six) hours as needed for wheezing or shortness of breath.   08/18/2014 at Unknown time  . Doxylamine-Pyridoxine 10-10 MG TBEC Take 1 tablet by mouth daily as needed (for nausea).   08/18/2014 at Unknown time  . hydroxypropyl methylcellulose / hypromellose (ISOPTO TEARS / GONIOVISC) 2.5 % ophthalmic solution Place 1 drop into both eyes daily as needed for dry eyes.   08/18/2014 at Unknown time  . Prenatal Vit-Min-FA-Fish Oil ([facility]S PRENATAL GUMMY PO) Take 2 tablets by mouth daily.   08/18/2014 at Unknown time  . sodium chloride (OCEAN) 0.65 % SOLN nasal spray Place 2 sprays into both nostrils daily as needed for congestion.   08/18/2014 at Unknown time  . metoCLOPramide (REGLAN) 10 MG tablet Take 1 tablet (10 mg total) by mouth 4 (four)  times daily -  before meals and at bedtime. (Patient not taking: Reported on 08/06/2014) 120 tablet 1 Past Week at Unknown time  . promethazine (PHENERGAN) 25 MG tablet Take 0.5-1 tablets (12.5-25 mg total) by mouth every 6 (six) hours as needed. (Patient not taking: Reported on 08/19/2014) 30 tablet 2     ROS:  Review of Systems  Constitutional: Negative for fever and chills.  HENT: Negative for congestion and ear pain.   Eyes: Negative for blurred vision, double vision and photophobia.  Respiratory: Negative for cough, sputum production, shortness of breath and wheezing.   Cardiovascular: Negative for chest pain.  Gastrointestinal: Positive for abdominal pain ( contractions only). Negative for diarrhea and  constipation.  Genitourinary: Negative for dysuria, urgency, frequency and hematuria.       Negative for vaginal bleeding, vaginal itching or vaginal odor.  Neurological: Positive for headaches.    Physical Exam  Blood pressure 125/77, pulse 100, temperature 98.7 F (37.1 C), temperature source Oral, resp. rate 18, last menstrual period 12/04/2013. GENERAL: Well-developed, well-nourished female in no acute distress.  HEART: normal rate and rhythm. No murmurs rubs or gallops. RESP: normal effort. Clear to auscultation bilaterally. GI: Abd soft, non-tender, gravid appropriate for gestational age.  MS: Extremities nontender, no edema, normal ROM NEURO: Alert and oriented x 4.  GU: NEFG, physiologic discharge and cervical mucus. Negative pooling. No fluid coming through the os with Valsalva. no blood, cervix clean.  Dilation: 1 Effacement (%): Thick Presentation: Vertex Exam by:: Marlou Porch CNM  FHT:  Baseline 140 , minimal- moderate variability, 10x10 accelerations present, no decelerations Contractions: Rare, mild   Labs: Results for orders placed or performed during the hospital encounter of 08/19/14 (from the past 24 hour(s))  POCT fern test     Status: None   Collection Time: 08/19/14 11:45 AM  Result Value Ref Range   POCT Fern Test Negative = intact amniotic membranes    Imaging:  BPP 8/8, AFI 14cm, Vtx  MAU Course: NST NR even after PO hydration, position changes. BPP ordered.  Assessment: 1. Vaginal discharge during pregnancy in third trimester   2. Non-stress test nonreactive   3. Asthma affecting pregnancy, antepartum    Plan: Discharge home in stable condition.  Labor precautions and fetal kick counts   Follow-up Information    Follow up with Melina Schools, MD.   Specialty:  Obstetrics and Gynecology   Why:  As scheduled for prenatl appointment   Contact information:   [address] [place] [address] [place] [address] [phone]       Follow up  with REDMAN,MICHELLE, MD.   Specialty:  Internal Medicine   Why:  for asthma exacerbation      Follow up with [place].   Why:  As needed in emergencies   Contact information:   [address] [id number] [place] [phone]        Medication List    STOP taking these medications        metoCLOPramide 10 MG tablet  Commonly known as:  REGLAN     promethazine 25 MG tablet  Commonly known as:  PHENERGAN      TAKE these medications        acetaminophen 500 MG tablet  Commonly known as:  TYLENOL  Take 500 mg by mouth every 6 (six) hours as needed for moderate pain.     albuterol 108 (90 BASE) MCG/ACT inhaler  Commonly  known as:  PROVENTIL HFA;VENTOLIN HFA  Inhale 1-2 puffs into the lungs every 6 (six) hours as needed for wheezing or shortness of breath.     CVS PRENATAL GUMMY PO  Take 2 tablets by mouth daily.     Doxylamine-Pyridoxine 10-10 MG Tbec  Take 1 tablet by mouth daily as needed (for nausea).     hydroxypropyl methylcellulose / hypromellose 2.5 % ophthalmic solution  Commonly known as:  ISOPTO TEARS / GONIOVISC  Place 1 drop into both eyes daily as needed for dry eyes.     sodium chloride 0.65 % Soln nasal spray  Commonly known as:  OCEAN  Place 2 sprays into both nostrils daily as needed for congestion.       [place], CNM 08/19/2014 11:15 AM

## 2014-08-19 NOTE — MAU Note (Signed)
Urine in lab 

## 2014-08-19 NOTE — Discharge Instructions (Signed)
Braxton Hicks Contractions Contractions of the uterus can occur throughout pregnancy. Contractions are not always a sign that you are in labor.  WHAT ARE BRAXTON HICKS CONTRACTIONS?  Contractions that occur before labor are called Braxton Hicks contractions, or false labor. Toward the end of pregnancy (32-34 weeks), these contractions can develop more often and may become more forceful. This is not true labor because these contractions do not result in opening (dilatation) and thinning of the cervix. They are sometimes difficult to tell apart from true labor because these contractions can be forceful and people have different pain tolerances. You should not feel embarrassed if you go to the hospital with false labor. Sometimes, the only way to tell if you are in true labor is for your health care provider to look for changes in the cervix. If there are no prenatal problems or other health problems associated with the pregnancy, it is completely safe to be sent home with false labor and await the onset of true labor. HOW CAN YOU TELL THE DIFFERENCE BETWEEN TRUE AND FALSE LABOR? False Labor  The contractions of false labor are usually shorter and not as hard as those of true labor.   The contractions are usually irregular.   The contractions are often felt in the front of the lower abdomen and in the groin.   The contractions may go away when you walk around or change positions while lying down.   The contractions get weaker and are shorter lasting as time goes on.   The contractions do not usually become progressively stronger, regular, and closer together as with true labor.  True Labor 1. Contractions in true labor last 30-70 seconds, become very regular, usually become more intense, and increase in frequency.  2. The contractions do not go away with walking.  3. The discomfort is usually felt in the top of the uterus and spreads to the lower abdomen and low back.  4. True labor  can be determined by your health care provider with an exam. This will show that the cervix is dilating and getting thinner.  WHAT TO REMEMBER  Keep up with your usual exercises and follow other instructions given by your health care provider.   Take medicines as directed by your health care provider.   Keep your regular prenatal appointments.   Eat and drink lightly if you think you are going into labor.   If Braxton Hicks contractions are making you uncomfortable:   Change your position from lying down or resting to walking, or from walking to resting.   Sit and rest in a tub of warm water.   Drink 2-3 glasses of water. Dehydration may cause these contractions.   Do slow and deep breathing several times an hour.  WHEN SHOULD I SEEK IMMEDIATE MEDICAL CARE? Seek immediate medical care if:  Your contractions become stronger, more regular, and closer together.   You have fluid leaking or gushing from your vagina.   You have a fever.   You pass blood-tinged mucus.   You have vaginal bleeding.   You have continuous abdominal pain.   You have low back pain that you never had before.   You feel your baby's head pushing down and causing pelvic pressure.   Your baby is not moving as much as it used to.  Document Released: 04/18/2005 Document Revised: 04/23/2013 Document Reviewed: 01/28/2013 [facility] Patient Information 2015 [place], [place]. This information is not intended to replace advice given to you by your health care  provider. Make sure you discuss any questions you have with your health care provider.  Asthma Asthma is a recurring condition in which the airways tighten and narrow. Asthma can make it difficult to breathe. It can cause coughing, wheezing, and shortness of breath. Asthma episodes, also called asthma attacks, range from minor to life-threatening. Asthma cannot be cured, but medicines and lifestyle changes can help control it. CAUSES Asthma  is believed to be caused by inherited (genetic) and environmental factors, but its exact cause is unknown. Asthma may be triggered by allergens, lung infections, or irritants in the air. Asthma triggers are different for each person. Common triggers include:   Animal dander.  Dust mites.  Cockroaches.  Pollen from trees or grass.  Mold.  Smoke.  Air pollutants such as dust, household cleaners, hair sprays, aerosol sprays, paint fumes, strong chemicals, or strong odors.  Cold air, weather changes, and winds (which increase molds and pollens in the air).  Strong emotional expressions such as crying or laughing hard.  Stress.  Certain medicines (such as aspirin) or types of drugs (such as beta-blockers).  Sulfites in foods and drinks. Foods and drinks that may contain sulfites include dried fruit, potato chips, and sparkling grape juice.  Infections or inflammatory conditions such as the flu, a cold, or an inflammation of the nasal membranes (rhinitis).  Gastroesophageal reflux disease (GERD).  Exercise or strenuous activity. SYMPTOMS Symptoms may occur immediately after asthma is triggered or many hours later. Symptoms include: 5. Wheezing. 6. Excessive nighttime or early morning coughing. 7. Frequent or severe coughing with a common cold. 8. Chest tightness. 9. Shortness of breath. DIAGNOSIS  The diagnosis of asthma is made by a review of your medical history and a physical exam. Tests may also be performed. These may include:  Lung function studies. These tests show how much air you breathe in and out.  Allergy tests.  Imaging tests such as X-rays. TREATMENT  Asthma cannot be cured, but it can usually be controlled. Treatment involves identifying and avoiding your asthma triggers. It also involves medicines. There are 2 classes of medicine used for asthma treatment:   Controller medicines. These prevent asthma symptoms from occurring. They are usually taken every  day.  Reliever or rescue medicines. These quickly relieve asthma symptoms. They are used as needed and provide short-term relief. Your health care provider will help you create an asthma action plan. An asthma action plan is a written plan for managing and treating your asthma attacks. It includes a list of your asthma triggers and how they may be avoided. It also includes information on when medicines should be taken and when their dosage should be changed. An action plan may also involve the use of a device called a peak flow meter. A peak flow meter measures how well the lungs are working. It helps you monitor your condition. HOME CARE INSTRUCTIONS   Take medicines only as directed by your health care provider. Speak with your health care provider if you have questions about how or when to take the medicines.  Use a peak flow meter as directed by your health care provider. Record and keep track of readings.  Understand and use the action plan to help minimize or stop an asthma attack without needing to seek medical care.  Control your home environment in the following ways to help prevent asthma attacks:  Do not smoke. Avoid being exposed to secondhand smoke.  Change your heating and air conditioning filter regularly.  Limit  your use of fireplaces and wood stoves.  Get rid of pests (such as roaches and mice) and their droppings.  Throw away plants if you see mold on them.  Clean your floors and dust regularly. Use unscented cleaning products.  Try to have someone else vacuum for you regularly. Stay out of rooms while they are being vacuumed and for a short while afterward. If you vacuum, use a dust mask from a hardware store, a double-layered or microfilter vacuum cleaner bag, or a vacuum cleaner with a HEPA filter.  Replace carpet with wood, tile, or vinyl flooring. Carpet can trap dander and dust.  Use allergy-proof pillows, mattress covers, and box spring covers.  Wash bed  sheets and blankets every week in hot water and dry them in a dryer.  Use blankets that are made of polyester or cotton.  Clean bathrooms and kitchens with bleach. If possible, have someone repaint the walls in these rooms with mold-resistant paint. Keep out of the rooms that are being cleaned and painted.  Wash hands frequently. SEEK MEDICAL CARE IF:   You have wheezing, shortness of breath, or a cough even if taking medicine to prevent attacks.  The colored mucus you cough up (sputum) is thicker than usual.  Your sputum changes from clear or white to yellow, green, gray, or bloody.  You have any problems that may be related to the medicines you are taking (such as a rash, itching, swelling, or trouble breathing).  You are using a reliever medicine more than 2-3 times per week.  Your peak flow is still at 50-79% of your personal best after following your action plan for 1 hour.  You have a fever. SEEK IMMEDIATE MEDICAL CARE IF:   You seem to be getting worse and are unresponsive to treatment during an asthma attack.  You are short of breath even at rest.  You get short of breath when doing very little physical activity.  You have difficulty eating, drinking, or talking due to asthma symptoms.  You develop chest pain.  You develop a fast heartbeat.  You have a bluish color to your lips or fingernails.  You are light-headed, dizzy, or faint.  Your peak flow is less than 50% of your personal best. MAKE SURE YOU:   Understand these instructions.  Will watch your condition.  Will get help right away if you are not doing well or get worse. Document Released: 04/18/2005 Document Revised: 09/02/2013 Document Reviewed: 11/15/2012 [facility] Patient Information 2015 [place], [place]. This information is not intended to replace advice given to you by your health care provider. Make sure you discuss any questions you have with your health care provider.   Fetal Movement  Counts Patient Name: __________________________________________________ Patient Due Date: ____________________ Performing a fetal movement count is highly recommended in high-risk pregnancies, but it is good for every pregnant woman to do. Your health care provider may ask you to start counting fetal movements at 28 weeks of the pregnancy. Fetal movements often increase:  After eating a full meal.  After physical activity.  After eating or drinking something sweet or cold.  At rest. Pay attention to when you feel the baby is most active. This will help you notice a pattern of your baby's sleep and wake cycles and what factors contribute to an increase in fetal movement. It is important to perform a fetal movement count at the same time each day when your baby is normally most active.  HOW TO COUNT FETAL MOVEMENTS 10.  Find a quiet and comfortable area to sit or lie down on your left side. Lying on your left side provides the best blood and oxygen circulation to your baby. 11. Write down the day and time on a sheet of paper or in a journal. 12. Start counting kicks, flutters, swishes, rolls, or jabs in a 2-hour period. You should feel at least 10 movements within 2 hours. 13. If you do not feel 10 movements in 2 hours, wait 2-3 hours and count again. Look for a change in the pattern or not enough counts in 2 hours. SEEK MEDICAL CARE IF:  You feel less than 10 counts in 2 hours, tried twice.  There is no movement in over an hour.  The pattern is changing or taking longer each day to reach 10 counts in 2 hours.  You feel the baby is not moving as he or she usually does. Date: ____________ Movements: ____________ Start time: ____________ Elizebeth Koller time: ____________  Date: ____________ Movements: ____________ Start time: ____________ Elizebeth Koller time: ____________ Date: ____________ Movements: ____________ Start time: ____________ Elizebeth Koller time: ____________ Date: ____________ Movements: ____________  Start time: ____________ Elizebeth Koller time: ____________ Date: ____________ Movements: ____________ Start time: ____________ Elizebeth Koller time: ____________ Date: ____________ Movements: ____________ Start time: ____________ Elizebeth Koller time: ____________ Date: ____________ Movements: ____________ Start time: ____________ Elizebeth Koller time: ____________ Date: ____________ Movements: ____________ Start time: ____________ Elizebeth Koller time: ____________  Date: ____________ Movements: ____________ Start time: ____________ Elizebeth Koller time: ____________ Date: ____________ Movements: ____________ Start time: ____________ Elizebeth Koller time: ____________ Date: ____________ Movements: ____________ Start time: ____________ Elizebeth Koller time: ____________ Date: ____________ Movements: ____________ Start time: ____________ Elizebeth Koller time: ____________ Date: ____________ Movements: ____________ Start time: ____________ Elizebeth Koller time: ____________ Date: ____________ Movements: ____________ Start time: ____________ Elizebeth Koller time: ____________ Date: ____________ Movements: ____________ Start time: ____________ Elizebeth Koller time: ____________  Date: ____________ Movements: ____________ Start time: ____________ Elizebeth Koller time: ____________ Date: ____________ Movements: ____________ Start time: ____________ Elizebeth Koller time: ____________ Date: ____________ Movements: ____________ Start time: ____________ Elizebeth Koller time: ____________ Date: ____________ Movements: ____________ Start time: ____________ Elizebeth Koller time: ____________ Date: ____________ Movements: ____________ Start time: ____________ Elizebeth Koller time: ____________ Date: ____________ Movements: ____________ Start time: ____________ Elizebeth Koller time: ____________ Date: ____________ Movements: ____________ Start time: ____________ Elizebeth Koller time: ____________  Date: ____________ Movements: ____________ Start time: ____________ Elizebeth Koller time: ____________ Date: ____________ Movements: ____________ Start time: ____________ Elizebeth Koller time:  ____________ Date: ____________ Movements: ____________ Start time: ____________ Elizebeth Koller time: ____________ Date: ____________ Movements: ____________ Start time: ____________ Elizebeth Koller time: ____________ Date: ____________ Movements: ____________ Start time: ____________ Elizebeth Koller time: ____________ Date: ____________ Movements: ____________ Start time: ____________ Elizebeth Koller time: ____________ Date: ____________ Movements: ____________ Start time: ____________ Elizebeth Koller time: ____________  Date: ____________ Movements: ____________ Start time: ____________ Elizebeth Koller time: ____________ Date: ____________ Movements: ____________ Start time: ____________ Elizebeth Koller time: ____________ Date: ____________ Movements: ____________ Start time: ____________ Elizebeth Koller time: ____________ Date: ____________ Movements: ____________ Start time: ____________ Elizebeth Koller time: ____________ Date: ____________ Movements: ____________ Start time: ____________ Elizebeth Koller time: ____________ Date: ____________ Movements: ____________ Start time: ____________ Elizebeth Koller time: ____________ Date: ____________ Movements: ____________ Start time: ____________ Elizebeth Koller time: ____________  Date: ____________ Movements: ____________ Start time: ____________ Elizebeth Koller time: ____________ Date: ____________ Movements: ____________ Start time: ____________ Elizebeth Koller time: ____________ Date: ____________ Movements: ____________ Start time: ____________ Elizebeth Koller time: ____________ Date: ____________ Movements: ____________ Start time: ____________ Elizebeth Koller time: ____________ Date: ____________ Movements: ____________ Start time: ____________ Elizebeth Koller time: ____________ Date: ____________ Movements: ____________ Start time: ____________ Elizebeth Koller time: ____________ Date: ____________ Movements: ____________ Start time: ____________ Elizebeth Koller time: ____________  Date: ____________ Movements: ____________ Start  time: ____________ Elizebeth Koller time: ____________ Date: ____________ Movements:  ____________ Start time: ____________ Elizebeth Koller time: ____________ Date: ____________ Movements: ____________ Start time: ____________ Elizebeth Koller time: ____________ Date: ____________ Movements: ____________ Start time: ____________ Elizebeth Koller time: ____________ Date: ____________ Movements: ____________ Start time: ____________ Elizebeth Koller time: ____________ Date: ____________ Movements: ____________ Start time: ____________ Elizebeth Koller time: ____________ Date: ____________ Movements: ____________ Start time: ____________ Elizebeth Koller time: ____________  Date: ____________ Movements: ____________ Start time: ____________ Elizebeth Koller time: ____________ Date: ____________ Movements: ____________ Start time: ____________ Elizebeth Koller time: ____________ Date: ____________ Movements: ____________ Start time: ____________ Elizebeth Koller time: ____________ Date: ____________ Movements: ____________ Start time: ____________ Elizebeth Koller time: ____________ Date: ____________ Movements: ____________ Start time: ____________ Elizebeth Koller time: ____________ Date: ____________ Movements: ____________ Start time: ____________ Elizebeth Koller time: ____________ Document Released: 05/18/2006 Document Revised: 09/02/2013 Document Reviewed: 02/13/2012 [employer] Patient Information 2015 [place]. This information is not intended to replace advice given to you by your health care provider. Make sure you discuss any questions you have with your health care provider.

## 2014-08-28 ENCOUNTER — Inpatient Hospital Stay (HOSPITAL_COMMUNITY)
Admission: AD | Admit: 2014-08-28 | Discharge: 2014-08-28 | Disposition: A | Discharge status: Home / Self Care | Payer: Medicaid Other | Source: Ambulatory Visit | Attending: Obstetrics and Gynecology | Admitting: Obstetrics and Gynecology

## 2014-08-28 ENCOUNTER — Encounter (HOSPITAL_COMMUNITY): Payer: Self-pay

## 2014-08-28 DIAGNOSIS — Z3A39 39 weeks gestation of pregnancy: Secondary | ICD-10-CM | POA: Insufficient documentation

## 2014-08-28 NOTE — Discharge Instructions (Signed)
Braxton Hicks Contractions °Contractions of the uterus can occur throughout pregnancy. Contractions are not always a sign that you are in labor.  °WHAT ARE BRAXTON HICKS CONTRACTIONS?  °Contractions that occur before labor are called Braxton Hicks contractions, or false labor. Toward the end of pregnancy (32-34 weeks), these contractions can develop more often and may become more forceful. This is not true labor because these contractions do not result in opening (dilatation) and thinning of the cervix. They are sometimes difficult to tell apart from true labor because these contractions can be forceful and people have different pain tolerances. You should not feel embarrassed if you go to the hospital with false labor. Sometimes, the only way to tell if you are in true labor is for your health care provider to look for changes in the cervix. °If there are no prenatal problems or other health problems associated with the pregnancy, it is completely safe to be sent home with false labor and await the onset of true labor. °HOW CAN YOU TELL THE DIFFERENCE BETWEEN TRUE AND FALSE LABOR? °False Labor °· The contractions of false labor are usually shorter and not as hard as those of true labor.   °· The contractions are usually irregular.   °· The contractions are often felt in the front of the lower abdomen and in the groin.   °· The contractions may go away when you walk around or change positions while lying down.   °· The contractions get weaker and are shorter lasting as time goes on.   °· The contractions do not usually become progressively stronger, regular, and closer together as with true labor.   °True Labor °1. Contractions in true labor last 30-70 seconds, become very regular, usually become more intense, and increase in frequency.   °2. The contractions do not go away with walking.   °3. The discomfort is usually felt in the top of the uterus and spreads to the lower abdomen and low back.   °4. True labor can  be determined by your health care provider with an exam. This will show that the cervix is dilating and getting thinner.   °WHAT TO REMEMBER °· Keep up with your usual exercises and follow other instructions given by your health care provider.   °· Take medicines as directed by your health care provider.   °· Keep your regular prenatal appointments.   °· Eat and drink lightly if you think you are going into labor.   °· If Braxton Hicks contractions are making you uncomfortable:   °· Change your position from lying down or resting to walking, or from walking to resting.   °· Sit and rest in a tub of warm water.   °· Drink 2-3 glasses of water. Dehydration may cause these contractions.   °· Do slow and deep breathing several times an hour.   °WHEN SHOULD I SEEK IMMEDIATE MEDICAL CARE? °Seek immediate medical care if: °· Your contractions become stronger, more regular, and closer together.   °· You have fluid leaking or gushing from your vagina.   °· You have a fever.   °· You pass blood-tinged mucus.   °· You have vaginal bleeding.   °· You have continuous abdominal pain.   °· You have low back pain that you never had before.   °· You feel your baby's head pushing down and causing pelvic pressure.   °· Your baby is not moving as much as it used to.   °Document Released: 04/18/2005 Document Revised: 04/23/2013 Document Reviewed: 01/28/2013 °[employer]® Patient Information ©2015 [employer]. This information is not intended to replace advice given to you by your health care   provider. Make sure you discuss any questions you have with your health care provider. ° °Fetal Movement Counts °Patient Name: __________________________________________________ Patient Due Date: ____________________ °Performing a fetal movement count is highly recommended in high-risk pregnancies, but it is good for every pregnant woman to do. Your health care provider may ask you to start counting fetal movements at 28 weeks of the pregnancy. Fetal  movements often increase: °· After eating a full meal. °· After physical activity. °· After eating or drinking something sweet or cold. °· At rest. °Pay attention to when you feel the baby is most active. This will help you notice a pattern of your baby's sleep and wake cycles and what factors contribute to an increase in fetal movement. It is important to perform a fetal movement count at the same time each day when your baby is normally most active.  °HOW TO COUNT FETAL MOVEMENTS °5. Find a quiet and comfortable area to sit or lie down on your left side. Lying on your left side provides the best blood and oxygen circulation to your baby. °6. Write down the day and time on a sheet of paper or in a journal. °7. Start counting kicks, flutters, swishes, rolls, or jabs in a 2-hour period. You should feel at least 10 movements within 2 hours. °8. If you do not feel 10 movements in 2 hours, wait 2-3 hours and count again. Look for a change in the pattern or not enough counts in 2 hours. °SEEK MEDICAL CARE IF: °· You feel less than 10 counts in 2 hours, tried twice. °· There is no movement in over an hour. °· The pattern is changing or taking longer each day to reach 10 counts in 2 hours. °· You feel the baby is not moving as he or she usually does. °Date: ____________ Movements: ____________ Start time: ____________ Finish time: ____________  °Date: ____________ Movements: ____________ Start time: ____________ Finish time: ____________ °Date: ____________ Movements: ____________ Start time: ____________ Finish time: ____________ °Date: ____________ Movements: ____________ Start time: ____________ Finish time: ____________ °Date: ____________ Movements: ____________ Start time: ____________ Finish time: ____________ °Date: ____________ Movements: ____________ Start time: ____________ Finish time: ____________ °Date: ____________ Movements: ____________ Start time: ____________ Finish time: ____________ °Date: ____________  Movements: ____________ Start time: ____________ Finish time: ____________  °Date: ____________ Movements: ____________ Start time: ____________ Finish time: ____________ °Date: ____________ Movements: ____________ Start time: ____________ Finish time: ____________ °Date: ____________ Movements: ____________ Start time: ____________ Finish time: ____________ °Date: ____________ Movements: ____________ Start time: ____________ Finish time: ____________ °Date: ____________ Movements: ____________ Start time: ____________ Finish time: ____________ °Date: ____________ Movements: ____________ Start time: ____________ Finish time: ____________ °Date: ____________ Movements: ____________ Start time: ____________ Finish time: ____________  °Date: ____________ Movements: ____________ Start time: ____________ Finish time: ____________ °Date: ____________ Movements: ____________ Start time: ____________ Finish time: ____________ °Date: ____________ Movements: ____________ Start time: ____________ Finish time: ____________ °Date: ____________ Movements: ____________ Start time: ____________ Finish time: ____________ °Date: ____________ Movements: ____________ Start time: ____________ Finish time: ____________ °Date: ____________ Movements: ____________ Start time: ____________ Finish time: ____________ °Date: ____________ Movements: ____________ Start time: ____________ Finish time: ____________  °Date: ____________ Movements: ____________ Start time: ____________ Finish time: ____________ °Date: ____________ Movements: ____________ Start time: ____________ Finish time: ____________ °Date: ____________ Movements: ____________ Start time: ____________ Finish time: ____________ °Date: ____________ Movements: ____________ Start time: ____________ Finish time: ____________ °Date: ____________ Movements: ____________ Start time: ____________ Finish time: ____________ °Date: ____________ Movements: ____________ Start time:  ____________ Finish time: ____________ °Date: ____________ Movements:   ____________ Start time: ____________ Finish time: ____________  °Date: ____________ Movements: ____________ Start time: ____________ Finish time: ____________ °Date: ____________ Movements: ____________ Start time: ____________ Finish time: ____________ °Date: ____________ Movements: ____________ Start time: ____________ Finish time: ____________ °Date: ____________ Movements: ____________ Start time: ____________ Finish time: ____________ °Date: ____________ Movements: ____________ Start time: ____________ Finish time: ____________ °Date: ____________ Movements: ____________ Start time: ____________ Finish time: ____________ °Date: ____________ Movements: ____________ Start time: ____________ Finish time: ____________  °Date: ____________ Movements: ____________ Start time: ____________ Finish time: ____________ °Date: ____________ Movements: ____________ Start time: ____________ Finish time: ____________ °Date: ____________ Movements: ____________ Start time: ____________ Finish time: ____________ °Date: ____________ Movements: ____________ Start time: ____________ Finish time: ____________ °Date: ____________ Movements: ____________ Start time: ____________ Finish time: ____________ °Date: ____________ Movements: ____________ Start time: ____________ Finish time: ____________ °Date: ____________ Movements: ____________ Start time: ____________ Finish time: ____________  °Date: ____________ Movements: ____________ Start time: ____________ Finish time: ____________ °Date: ____________ Movements: ____________ Start time: ____________ Finish time: ____________ °Date: ____________ Movements: ____________ Start time: ____________ Finish time: ____________ °Date: ____________ Movements: ____________ Start time: ____________ Finish time: ____________ °Date: ____________ Movements: ____________ Start time: ____________ Finish time: ____________ °Date:  ____________ Movements: ____________ Start time: ____________ Finish time: ____________ °Date: ____________ Movements: ____________ Start time: ____________ Finish time: ____________  °Date: ____________ Movements: ____________ Start time: ____________ Finish time: ____________ °Date: ____________ Movements: ____________ Start time: ____________ Finish time: ____________ °Date: ____________ Movements: ____________ Start time: ____________ Finish time: ____________ °Date: ____________ Movements: ____________ Start time: ____________ Finish time: ____________ °Date: ____________ Movements: ____________ Start time: ____________ Finish time: ____________ °Date: ____________ Movements: ____________ Start time: ____________ Finish time: ____________ °Document Released: 05/18/2006 Document Revised: 09/02/2013 Document Reviewed: 02/13/2012 °[employer]® Patient Information ©2015 [employer]. This information is not intended to replace advice given to you by your health care provider. Make sure you discuss any questions you have with your health care provider. ° °

## 2014-08-28 NOTE — [facility] Note (Signed)
Contractions today. Positive fetal movement. Denies LOF or vaginal bleeding.

## 2014-08-30 ENCOUNTER — Inpatient Hospital Stay (HOSPITAL_COMMUNITY)
Admission: AD | Admit: 2014-08-30 | Discharge: 2014-08-30 | Disposition: A | Discharge status: Home / Self Care | Payer: Medicaid Other | Source: Ambulatory Visit | Attending: Obstetrics and Gynecology | Admitting: Obstetrics and Gynecology

## 2014-08-30 ENCOUNTER — Encounter (HOSPITAL_COMMUNITY): Payer: Self-pay

## 2014-08-30 ENCOUNTER — Inpatient Hospital Stay (HOSPITAL_COMMUNITY): Payer: Medicaid Other

## 2014-08-30 DIAGNOSIS — R109 Unspecified abdominal pain: Secondary | ICD-10-CM

## 2014-08-30 DIAGNOSIS — Z3A38 38 weeks gestation of pregnancy: Secondary | ICD-10-CM | POA: Diagnosis present

## 2014-08-30 DIAGNOSIS — O9989 Other specified diseases and conditions complicating pregnancy, childbirth and the puerperium: Secondary | ICD-10-CM

## 2014-08-30 DIAGNOSIS — O288 Other abnormal findings on antenatal screening of mother: Secondary | ICD-10-CM

## 2014-08-30 DIAGNOSIS — M545 Low back pain: Secondary | ICD-10-CM | POA: Insufficient documentation

## 2014-08-30 LAB — URINALYSIS, ROUTINE W REFLEX MICROSCOPIC
Bilirubin Urine: NEGATIVE
Glucose, UA: NEGATIVE mg/dL
Hgb urine dipstick: NEGATIVE
KETONES UR: NEGATIVE mg/dL
NITRITE: NEGATIVE
Protein, ur: NEGATIVE mg/dL
Specific Gravity, Urine: 1.01 (ref 1.005–1.030)
Urobilinogen, UA: 0.2 mg/dL (ref 0.0–1.0)
pH: 6 (ref 5.0–8.0)

## 2014-08-30 LAB — URINE MICROSCOPIC-ADD ON

## 2014-08-30 NOTE — Progress Notes (Signed)
Notified of pt BPP of 6/8 for breathing and non reactive tracing after return from [facility]. Will watch for 30 more minutes and if not reactive, will have pt return to [facility] tomorrow for NST

## 2014-08-30 NOTE — Progress Notes (Signed)
Notified of pt arrival in [facility], complaint, vaginal exam and non reactive FHR tracing. Will give another 30 minutes to become reactive and gt a BPP if not

## 2014-08-30 NOTE — Discharge Instructions (Signed)
Third Trimester of Pregnancy The third trimester is from week 29 through week 42, months 7 through 9. The third trimester is a time when the fetus is growing rapidly. At the end of the ninth month, the fetus is about 20 inches in length and weighs 6-10 pounds.  BODY CHANGES Your body goes through many changes during pregnancy. The changes vary from woman to woman.   Your weight will continue to increase. You can expect to gain 25-35 pounds (11-16 kg) by the end of the pregnancy.  You may begin to get stretch marks on your hips, abdomen, and breasts.  You may urinate more often because the fetus is moving lower into your pelvis and pressing on your bladder.  You may develop or continue to have heartburn as a result of your pregnancy.  You may develop constipation because certain hormones are causing the muscles that push waste through your intestines to slow down.  You may develop hemorrhoids or swollen, bulging veins (varicose veins).  You may have pelvic pain because of the weight gain and pregnancy hormones relaxing your joints between the bones in your pelvis. Backaches may result from overexertion of the muscles supporting your posture.  You may have changes in your hair. These can include thickening of your hair, rapid growth, and changes in texture. Some women also have hair loss during or after pregnancy, or hair that feels dry or thin. Your hair will most likely return to normal after your baby is born.  Your breasts will continue to grow and be tender. A yellow discharge may leak from your breasts called colostrum.  Your belly button may stick out.  You may feel short of breath because of your expanding uterus.  You may notice the fetus "dropping," or moving lower in your abdomen.  You may have a bloody mucus discharge. This usually occurs a few days to a week before labor begins.  Your cervix becomes thin and soft (effaced) near your due date. WHAT TO EXPECT AT YOUR PRENATAL  EXAMS  You will have prenatal exams every 2 weeks until week 36. Then, you will have weekly prenatal exams. During a routine prenatal visit:  You will be weighed to make sure you and the fetus are growing normally.  Your blood pressure is taken.  Your abdomen will be measured to track your baby's growth.  The fetal heartbeat will be listened to.  Any test results from the previous visit will be discussed.  You may have a cervical check near your due date to see if you have effaced. At around 36 weeks, your caregiver will check your cervix. At the same time, your caregiver will also perform a test on the secretions of the vaginal tissue. This test is to determine if a type of bacteria, Group B streptococcus, is present. Your caregiver will explain this further. Your caregiver may ask you:  What your birth plan is.  How you are feeling.  If you are feeling the baby move.  If you have had any abnormal symptoms, such as leaking fluid, bleeding, severe headaches, or abdominal cramping.  If you have any questions. Other tests or screenings that may be performed during your third trimester include:  Blood tests that check for low iron levels (anemia).  Fetal testing to check the health, activity level, and growth of the fetus. Testing is done if you have certain medical conditions or if there are problems during the pregnancy. FALSE LABOR You may feel small, irregular contractions that  eventually go away. These are called Braxton Hicks contractions, or false labor. Contractions may last for hours, days, or even weeks before true labor sets in. If contractions come at regular intervals, intensify, or become painful, it is best to be seen by your caregiver.  SIGNS OF LABOR   Menstrual-like cramps.  Contractions that are 5 minutes apart or less.  Contractions that start on the top of the uterus and spread down to the lower abdomen and back.  A sense of increased pelvic pressure or back  pain.  A watery or bloody mucus discharge that comes from the vagina. If you have any of these signs before the 37th week of pregnancy, call your caregiver right away. You need to go to the hospital to get checked immediately. HOME CARE INSTRUCTIONS   Avoid all smoking, herbs, alcohol, and unprescribed drugs. These chemicals affect the formation and growth of the baby.  Follow your caregiver's instructions regarding medicine use. There are medicines that are either safe or unsafe to take during pregnancy.  Exercise only as directed by your caregiver. Experiencing uterine cramps is a good sign to stop exercising.  Continue to eat regular, healthy meals.  Wear a good support bra for breast tenderness.  Do not use hot tubs, steam rooms, or saunas.  Wear your seat belt at all times when driving.  Avoid raw meat, uncooked cheese, cat litter boxes, and soil used by cats. These carry germs that can cause birth defects in the baby.  Take your prenatal vitamins.  Try taking a stool softener (if your caregiver approves) if you develop constipation. Eat more high-fiber foods, such as fresh vegetables or fruit and whole grains. Drink plenty of fluids to keep your urine clear or pale yellow.  Take warm sitz baths to soothe any pain or discomfort caused by hemorrhoids. Use hemorrhoid cream if your caregiver approves.  If you develop varicose veins, wear support hose. Elevate your feet for 15 minutes, 3-4 times a day. Limit salt in your diet.  Avoid heavy lifting, wear low heal shoes, and practice good posture.  Rest a lot with your legs elevated if you have leg cramps or low back pain.  Visit your dentist if you have not gone during your pregnancy. Use a soft toothbrush to brush your teeth and be gentle when you floss.  A sexual relationship may be continued unless your caregiver directs you otherwise.  Do not travel far distances unless it is absolutely necessary and only with the approval  of your caregiver.  Take prenatal classes to understand, practice, and ask questions about the labor and delivery.  Make a trial run to the hospital.  Pack your hospital bag.  Prepare the baby's nursery.  Continue to go to all your prenatal visits as directed by your caregiver. SEEK MEDICAL CARE IF:  You are unsure if you are in labor or if your water has broken.  You have dizziness.  You have mild pelvic cramps, pelvic pressure, or nagging pain in your abdominal area.  You have persistent nausea, vomiting, or diarrhea.  You have a bad smelling vaginal discharge.  You have pain with urination. SEEK IMMEDIATE MEDICAL CARE IF:   You have a fever.  You are leaking fluid from your vagina.  You have spotting or bleeding from your vagina.  You have severe abdominal cramping or pain.  You have rapid weight loss or gain.  You have shortness of breath with chest pain.  You notice sudden or extreme swelling  of your face, hands, ankles, feet, or legs.  You have not felt your baby move in over an hour.  You have severe headaches that do not go away with medicine.  You have vision changes. Document Released: 04/12/2001 Document Revised: 04/23/2013 Document Reviewed: 06/19/2012 [facility] Patient Information 2015 [place], [place]. This information is not intended to replace advice given to you by your health care provider. Make sure you discuss any questions you have with your health care provider. Heartburn During Pregnancy  Heartburn is a burning sensation in the chest caused by stomach acid backing up into the esophagus. Heartburn is common in pregnancy because a certain hormone (progesterone) is released when a woman is pregnant. The progesterone hormone may relax the valve that separates the esophagus from the stomach. This allows acid to go up into the esophagus, causing heartburn. Heartburn may also happen in pregnancy because the enlarging uterus pushes up on the stomach, which  pushes more acid into the esophagus. This is especially true in the later stages of pregnancy. Heartburn problems usually go away after giving birth. CAUSES  Heartburn is caused by stomach acid backing up into the esophagus. During pregnancy, this may result from various things, including:   The progesterone hormone.  Changing hormone levels.  The growing uterus pushing stomach acid upward.  Large meals.  Certain foods and drinks.  Exercise.  Increased acid production. SIGNS AND SYMPTOMS   Burning pain in the chest or lower throat.  Bitter taste in the mouth.  Coughing. DIAGNOSIS  Your health care provider will typically diagnose heartburn by taking a careful history of your concern. Blood tests may be done to check for a certain type of bacteria that is associated with heartburn. Sometimes, heartburn is diagnosed by prescribing a heartburn medicine to see if the symptoms improve. In some cases, a procedure called an endoscopy may be done. In this procedure, a tube with a light and a camera on the end (endoscope) is used to examine the esophagus and the stomach. TREATMENT  Treatment will vary depending on the severity of your symptoms. Your health care provider may recommend:  Over-the-counter medicines (antacids, acid reducers) for mild heartburn.  Prescription medicines to decrease stomach acid or to protect your stomach lining.  Certain changes in your diet.  Elevating the head of your bed by putting blocks under the legs. This helps prevent stomach acid from backing up into the esophagus when you are lying down. HOME CARE INSTRUCTIONS   Only take over-the-counter or prescription medicines as directed by your health care provider.  Raise the head of your bed by putting blocks under the legs if instructed to do so by your health care provider. Sleeping with more pillows is not effective because it only changes the position of your head.  Do not exercise right after  eating.  Avoid eating 2-3 hours before bed. Do not lie down right after eating.  Eat small meals throughout the day instead of three large meals.  Identify foods and beverages that make your symptoms worse and avoid them. Foods you may want to avoid include:  Peppers.  Chocolate.  High-fat foods, including fried foods.  Spicy foods.  Garlic and onions.  Citrus fruits, including oranges, grapefruit, lemons, and limes.  Food containing tomatoes or tomato products.  Mint.  Carbonated and caffeinated drinks.  Vinegar. SEEK MEDICAL CARE IF:  You have abdominal pain of any kind.  You feel burning in your upper abdomen or chest, especially after eating or lying  down.  You have nausea and vomiting.  Your stomach feels upset after you eat. SEEK IMMEDIATE MEDICAL CARE IF:   You have severe chest pain that goes down your arm or into your jaw or neck.  You feel sweaty, dizzy, or light-headed.  You become short of breath.  You vomit blood.  You have difficulty or pain with swallowing.  You have bloody or black, tarry stools.  You have episodes of heartburn more than 3 times a week, for more than 2 weeks. MAKE SURE YOU:  Understand these instructions.  Will watch your condition.  Will get help right away if you are not doing well or get worse. Document Released: 04/15/2000 Document Revised: 04/23/2013 Document Reviewed: 12/05/2012 [facility] Patient Information 2015 [place], [place]. This information is not intended to replace advice given to you by your health care provider. Make sure you discuss any questions you have with your health care provider. Fetal Movement Counts Patient Name: __________________________________________________ Patient Due Date: ____________________ Performing a fetal movement count is highly recommended in high-risk pregnancies, but it is good for every pregnant woman to do. Your health care provider may ask you to start counting fetal movements  at 28 weeks of the pregnancy. Fetal movements often increase:  After eating a full meal.  After physical activity.  After eating or drinking something sweet or cold.  At rest. Pay attention to when you feel the baby is most active. This will help you notice a pattern of your baby's sleep and wake cycles and what factors contribute to an increase in fetal movement. It is important to perform a fetal movement count at the same time each day when your baby is normally most active.  HOW TO COUNT FETAL MOVEMENTS  Find a quiet and comfortable area to sit or lie down on your left side. Lying on your left side provides the best blood and oxygen circulation to your baby.  Write down the day and time on a sheet of paper or in a journal.  Start counting kicks, flutters, swishes, rolls, or jabs in a 2-hour period. You should feel at least 10 movements within 2 hours.  If you do not feel 10 movements in 2 hours, wait 2-3 hours and count again. Look for a change in the pattern or not enough counts in 2 hours. SEEK MEDICAL CARE IF:  You feel less than 10 counts in 2 hours, tried twice.  There is no movement in over an hour.  The pattern is changing or taking longer each day to reach 10 counts in 2 hours.  You feel the baby is not moving as he or she usually does. Date: ____________ Movements: ____________ Start time: ____________ Elizebeth Koller time: ____________  Date: ____________ Movements: ____________ Start time: ____________ Elizebeth Koller time: ____________ Date: ____________ Movements: ____________ Start time: ____________ Elizebeth Koller time: ____________ Date: ____________ Movements: ____________ Start time: ____________ Elizebeth Koller time: ____________ Date: ____________ Movements: ____________ Start time: ____________ Elizebeth Koller time: ____________ Date: ____________ Movements: ____________ Start time: ____________ Elizebeth Koller time: ____________ Date: ____________ Movements: ____________ Start time: ____________ Elizebeth Koller time:  ____________ Date: ____________ Movements: ____________ Start time: ____________ Elizebeth Koller time: ____________  Date: ____________ Movements: ____________ Start time: ____________ Elizebeth Koller time: ____________ Date: ____________ Movements: ____________ Start time: ____________ Elizebeth Koller time: ____________ Date: ____________ Movements: ____________ Start time: ____________ Elizebeth Koller time: ____________ Date: ____________ Movements: ____________ Start time: ____________ Elizebeth Koller time: ____________ Date: ____________ Movements: ____________ Start time: ____________ Elizebeth Koller time: ____________ Date: ____________ Movements: ____________ Start time: ____________ Elizebeth Koller time: ____________ Date: ____________  Movements: ____________ Start time: ____________ Elizebeth Koller time: ____________  Date: ____________ Movements: ____________ Start time: ____________ Elizebeth Koller time: ____________ Date: ____________ Movements: ____________ Start time: ____________ Elizebeth Koller time: ____________ Date: ____________ Movements: ____________ Start time: ____________ Elizebeth Koller time: ____________ Date: ____________ Movements: ____________ Start time: ____________ Elizebeth Koller time: ____________ Date: ____________ Movements: ____________ Start time: ____________ Elizebeth Koller time: ____________ Date: ____________ Movements: ____________ Start time: ____________ Elizebeth Koller time: ____________ Date: ____________ Movements: ____________ Start time: ____________ Elizebeth Koller time: ____________  Date: ____________ Movements: ____________ Start time: ____________ Elizebeth Koller time: ____________ Date: ____________ Movements: ____________ Start time: ____________ Elizebeth Koller time: ____________ Date: ____________ Movements: ____________ Start time: ____________ Elizebeth Koller time: ____________ Date: ____________ Movements: ____________ Start time: ____________ Elizebeth Koller time: ____________ Date: ____________ Movements: ____________ Start time: ____________ Elizebeth Koller time: ____________ Date: ____________ Movements:  ____________ Start time: ____________ Elizebeth Koller time: ____________ Date: ____________ Movements: ____________ Start time: ____________ Elizebeth Koller time: ____________  Date: ____________ Movements: ____________ Start time: ____________ Elizebeth Koller time: ____________ Date: ____________ Movements: ____________ Start time: ____________ Elizebeth Koller time: ____________ Date: ____________ Movements: ____________ Start time: ____________ Elizebeth Koller time: ____________ Date: ____________ Movements: ____________ Start time: ____________ Elizebeth Koller time: ____________ Date: ____________ Movements: ____________ Start time: ____________ Elizebeth Koller time: ____________ Date: ____________ Movements: ____________ Start time: ____________ Elizebeth Koller time: ____________ Date: ____________ Movements: ____________ Start time: ____________ Elizebeth Koller time: ____________  Date: ____________ Movements: ____________ Start time: ____________ Elizebeth Koller time: ____________ Date: ____________ Movements: ____________ Start time: ____________ Elizebeth Koller time: ____________ Date: ____________ Movements: ____________ Start time: ____________ Elizebeth Koller time: ____________ Date: ____________ Movements: ____________ Start time: ____________ Elizebeth Koller time: ____________ Date: ____________ Movements: ____________ Start time: ____________ Elizebeth Koller time: ____________ Date: ____________ Movements: ____________ Start time: ____________ Elizebeth Koller time: ____________ Date: ____________ Movements: ____________ Start time: ____________ Elizebeth Koller time: ____________  Date: ____________ Movements: ____________ Start time: ____________ Elizebeth Koller time: ____________ Date: ____________ Movements: ____________ Start time: ____________ Elizebeth Koller time: ____________ Date: ____________ Movements: ____________ Start time: ____________ Elizebeth Koller time: ____________ Date: ____________ Movements: ____________ Start time: ____________ Elizebeth Koller time: ____________ Date: ____________ Movements: ____________ Start time: ____________ Elizebeth Koller  time: ____________ Date: ____________ Movements: ____________ Start time: ____________ Elizebeth Koller time: ____________ Date: ____________ Movements: ____________ Start time: ____________ Elizebeth Koller time: ____________  Date: ____________ Movements: ____________ Start time: ____________ Elizebeth Koller time: ____________ Date: ____________ Movements: ____________ Start time: ____________ Elizebeth Koller time: ____________ Date: ____________ Movements: ____________ Start time: ____________ Elizebeth Koller time: ____________ Date: ____________ Movements: ____________ Start time: ____________ Elizebeth Koller time: ____________ Date: ____________ Movements: ____________ Start time: ____________ Elizebeth Koller time: ____________ Date: ____________ Movements: ____________ Start time: ____________ Elizebeth Koller time: ____________ Document Released: 05/18/2006 Document Revised: 09/02/2013 Document Reviewed: 02/13/2012 [employer] Patient Information 2015 [place] [employer]. This information is not intended to replace advice given to you by your health care provider. Make sure you discuss any questions you have with your health care provider. Braxton Hicks Contractions Contractions of the uterus can occur throughout pregnancy. Contractions are not always a sign that you are in labor.  WHAT ARE BRAXTON HICKS CONTRACTIONS?  Contractions that occur before labor are called Braxton Hicks contractions, or false labor. Toward the end of pregnancy (32-34 weeks), these contractions can develop more often and may become more forceful. This is not true labor because these contractions do not result in opening (dilatation) and thinning of the cervix. They are sometimes difficult to tell apart from true labor because these contractions can be forceful and people have different pain tolerances. You should not feel embarrassed if you go to the hospital with false labor. Sometimes, the only way to tell if you are in true labor is for your  health care provider to look for changes in the  cervix. If there are no prenatal problems or other health problems associated with the pregnancy, it is completely safe to be sent home with false labor and await the onset of true labor. HOW CAN YOU TELL THE DIFFERENCE BETWEEN TRUE AND FALSE LABOR? False Labor  The contractions of false labor are usually shorter and not as hard as those of true labor.   The contractions are usually irregular.   The contractions are often felt in the front of the lower abdomen and in the groin.   The contractions may go away when you walk around or change positions while lying down.   The contractions get weaker and are shorter lasting as time goes on.   The contractions do not usually become progressively stronger, regular, and closer together as with true labor.  True Labor  Contractions in true labor last 30-70 seconds, become very regular, usually become more intense, and increase in frequency.   The contractions do not go away with walking.   The discomfort is usually felt in the top of the uterus and spreads to the lower abdomen and low back.   True labor can be determined by your health care provider with an exam. This will show that the cervix is dilating and getting thinner.  WHAT TO REMEMBER  Keep up with your usual exercises and follow other instructions given by your health care provider.   Take medicines as directed by your health care provider.   Keep your regular prenatal appointments.   Eat and drink lightly if you think you are going into labor.   If Braxton Hicks contractions are making you uncomfortable:   Change your position from lying down or resting to walking, or from walking to resting.   Sit and rest in a tub of warm water.   Drink 2-3 glasses of water. Dehydration may cause these contractions.   Do slow and deep breathing several times an hour.  WHEN SHOULD I SEEK IMMEDIATE MEDICAL CARE? Seek immediate medical care if:  Your contractions  become stronger, more regular, and closer together.   You have fluid leaking or gushing from your vagina.   You have a fever.   You pass blood-tinged mucus.   You have vaginal bleeding.   You have continuous abdominal pain.   You have low back pain that you never had before.   You feel your baby's head pushing down and causing pelvic pressure.   Your baby is not moving as much as it used to.  Document Released: 04/18/2005 Document Revised: 04/23/2013 Document Reviewed: 01/28/2013 [facility] Patient Information 2015 [place], [place]. This information is not intended to replace advice given to you by your health care provider. Make sure you discuss any questions you have with your health care provider.

## 2014-08-30 NOTE — [facility] Note (Signed)
Pt presents to [facility] with complaints of lower back and abdominal pain since yesterday. Denies any vaginal bleeding or LOF

## 2014-08-31 ENCOUNTER — Encounter (HOSPITAL_COMMUNITY): Payer: Self-pay | Admitting: *Deleted

## 2014-08-31 ENCOUNTER — Inpatient Hospital Stay (HOSPITAL_COMMUNITY)
Admission: AD | Admit: 2014-08-31 | Discharge: 2014-09-02 | DRG: 775 | Disposition: A | Discharge status: Home / Self Care | Payer: Medicaid Other | Source: Ambulatory Visit | Attending: Obstetrics and Gynecology | Admitting: Obstetrics and Gynecology

## 2014-08-31 ENCOUNTER — Inpatient Hospital Stay (HOSPITAL_COMMUNITY): Payer: Medicaid Other | Admitting: Anesthesiology

## 2014-08-31 DIAGNOSIS — Z3A38 38 weeks gestation of pregnancy: Secondary | ICD-10-CM | POA: Diagnosis present

## 2014-08-31 DIAGNOSIS — Z3403 Encounter for supervision of normal first pregnancy, third trimester: Secondary | ICD-10-CM | POA: Diagnosis present

## 2014-08-31 DIAGNOSIS — O288 Other abnormal findings on antenatal screening of mother: Secondary | ICD-10-CM | POA: Insufficient documentation

## 2014-08-31 DIAGNOSIS — O4200 Premature rupture of membranes, onset of labor within 24 hours of rupture, unspecified weeks of gestation: Secondary | ICD-10-CM

## 2014-08-31 LAB — CBC
HEMATOCRIT: 32.6 % — AB (ref 36.0–46.0)
HEMOGLOBIN: 11.2 g/dL — AB (ref 12.0–15.0)
MCH: 28.6 pg (ref 26.0–34.0)
MCHC: 34.4 g/dL (ref 30.0–36.0)
MCV: 83.4 fL (ref 78.0–100.0)
PLATELETS: 272 10*3/uL (ref 150–400)
RBC: 3.91 MIL/uL (ref 3.87–5.11)
RDW: 14.3 % (ref 11.5–15.5)
WBC: 6.4 10*3/uL (ref 4.0–10.5)

## 2014-08-31 LAB — COMPREHENSIVE METABOLIC PANEL
ALBUMIN: 3.4 g/dL — AB (ref 3.5–5.0)
ALT: 12 U/L — ABNORMAL LOW (ref 14–54)
ANION GAP: 11 (ref 5–15)
AST: 29 U/L (ref 15–41)
Alkaline Phosphatase: 118 U/L (ref 38–126)
BILIRUBIN TOTAL: 0.8 mg/dL (ref 0.3–1.2)
CALCIUM: 9 mg/dL (ref 8.9–10.3)
CO2: 21 mmol/L — ABNORMAL LOW (ref 22–32)
Chloride: 106 mmol/L (ref 101–111)
Creatinine, Ser: 0.57 mg/dL (ref 0.44–1.00)
GFR calc non Af Amer: >60 mL/min (ref >60)
Glucose, Bld: 87 mg/dL (ref 70–99)
Potassium: 3.6 mmol/L (ref 3.5–5.1)
Sodium: 138 mmol/L (ref 135–145)
TOTAL PROTEIN: 7.2 g/dL (ref 6.5–8.1)

## 2014-08-31 LAB — TYPE AND SCREEN
ABO/RH(D): O POS
ANTIBODY SCREEN: NEGATIVE

## 2014-08-31 LAB — RPR: RPR Ser Ql: NONREACTIVE

## 2014-08-31 LAB — ABO/RH: ABO/RH(D): O POS

## 2014-08-31 MED ORDER — LACTATED RINGERS IV SOLN: INTRAVENOUS | Status: AC

## 2014-08-31 MED ORDER — TERBUTALINE SULFATE 1 MG/ML IJ SOLN: 0.2500 mg | Freq: Once | INTRAMUSCULAR | Status: DC | PRN

## 2014-08-31 MED ORDER — LANOLIN HYDROUS EX OINT: TOPICAL_OINTMENT | CUTANEOUS | Status: DC | PRN

## 2014-08-31 MED ORDER — POLYVINYL ALCOHOL 1.4 % OP SOLN
1.0000 [drp] | OPHTHALMIC | Status: DC | PRN
  Administered 2014-08-31: 1 [drp] via OPHTHALMIC
  Filled 2014-08-31: qty 15

## 2014-08-31 MED ORDER — BENZOCAINE-MENTHOL 20-0.5 % EX AERO
1.0000 "application " | INHALATION_SPRAY | CUTANEOUS | Status: DC | PRN
  Administered 2014-09-02: 1 via TOPICAL
  Filled 2014-08-31 (×2): qty 56

## 2014-08-31 MED ORDER — CITRIC ACID-SODIUM CITRATE 334-500 MG/5ML PO SOLN
30.0000 mL | ORAL | Status: DC | PRN
  Administered 2014-08-31: 30 mL via ORAL
  Filled 2014-08-31: qty 15

## 2014-08-31 MED ORDER — EPHEDRINE 5 MG/ML INJ: 10.0000 mg | INTRAVENOUS | Status: DC | PRN

## 2014-08-31 MED ORDER — ZOLPIDEM TARTRATE 5 MG PO TABS: 5.0000 mg | ORAL_TABLET | Freq: Every evening | ORAL | Status: DC | PRN

## 2014-08-31 MED ORDER — ONDANSETRON HCL 4 MG/2ML IJ SOLN: 4.0000 mg | Freq: Four times a day (QID) | INTRAMUSCULAR | Status: DC | PRN

## 2014-08-31 MED ORDER — SIMETHICONE 80 MG PO CHEW: 80.0000 mg | CHEWABLE_TABLET | ORAL | Status: DC | PRN

## 2014-08-31 MED ORDER — IBUPROFEN 600 MG PO TABS
600.0000 mg | ORAL_TABLET | Freq: Four times a day (QID) | ORAL | Status: DC
  Administered 2014-08-31 – 2014-09-02 (×7): 600 mg via ORAL
  Filled 2014-08-31 (×7): qty 1

## 2014-08-31 MED ORDER — ONDANSETRON HCL 4 MG PO TABS: 4.0000 mg | ORAL_TABLET | ORAL | Status: DC | PRN

## 2014-08-31 MED ORDER — OXYTOCIN 40 UNITS IN LACTATED RINGERS INFUSION - SIMPLE MED: 62.5000 mL/h | INTRAVENOUS | Status: AC

## 2014-08-31 MED ORDER — LACTATED RINGERS IV SOLN
500.0000 mL | INTRAVENOUS | Status: DC | PRN
  Administered 2014-08-31: 500 mL via INTRAVENOUS
  Administered 2014-08-31: 1000 mL via INTRAVENOUS

## 2014-08-31 MED ORDER — MEASLES, MUMPS & RUBELLA VAC ~~LOC~~ INJ: 0.5000 mL | INJECTION | Freq: Once | SUBCUTANEOUS | Status: DC

## 2014-08-31 MED ORDER — OXYTOCIN BOLUS FROM INFUSION
500.0000 mL | INTRAVENOUS | Status: DC
  Administered 2014-08-31: 500 mL via INTRAVENOUS

## 2014-08-31 MED ORDER — FENTANYL 2.5 MCG/ML BUPIVACAINE 1/10 % EPIDURAL INFUSION (WH - ANES)
14.0000 mL/h | INTRAMUSCULAR | Status: DC | PRN
  Administered 2014-08-31: 14 mL/h via EPIDURAL
  Administered 2014-08-31: 12 mL/h via EPIDURAL
  Filled 2014-08-31 (×2): qty 125

## 2014-08-31 MED ORDER — OXYCODONE-ACETAMINOPHEN 5-325 MG PO TABS
1.0000 | ORAL_TABLET | ORAL | Status: DC | PRN
Start: 2014-08-31 — End: 2014-08-31

## 2014-08-31 MED ORDER — PHENYLEPHRINE 40 MCG/ML (10ML) SYRINGE FOR IV PUSH (FOR BLOOD PRESSURE SUPPORT): 80.0000 ug | PREFILLED_SYRINGE | INTRAVENOUS | Status: DC | PRN

## 2014-08-31 MED ORDER — ONDANSETRON HCL 4 MG/2ML IJ SOLN: 4.0000 mg | INTRAMUSCULAR | Status: DC | PRN

## 2014-08-31 MED ORDER — LIDOCAINE-EPINEPHRINE (PF) 2 %-1:200000 IJ SOLN
INTRAMUSCULAR | Status: DC | PRN
  Administered 2014-08-31: 3 mL

## 2014-08-31 MED ORDER — DIPHENHYDRAMINE HCL 50 MG/ML IJ SOLN: 12.5000 mg | INTRAMUSCULAR | Status: DC | PRN

## 2014-08-31 MED ORDER — ACETAMINOPHEN 325 MG PO TABS: 650.0000 mg | ORAL_TABLET | ORAL | Status: DC | PRN

## 2014-08-31 MED ORDER — FLEET ENEMA 7-19 GM/118ML RE ENEM: 1.0000 | ENEMA | RECTAL | Status: DC | PRN

## 2014-08-31 MED ORDER — FENTANYL CITRATE (PF) 100 MCG/2ML IJ SOLN
INTRAMUSCULAR | Status: AC
  Administered 2014-08-31: 100 ug via EPIDURAL
  Filled 2014-08-31: qty 2

## 2014-08-31 MED ORDER — PRENATAL MULTIVITAMIN CH
1.0000 | ORAL_TABLET | Freq: Every day | ORAL | Status: DC
  Administered 2014-09-01: 1 via ORAL
  Filled 2014-08-31: qty 1

## 2014-08-31 MED ORDER — TETANUS-DIPHTH-ACELL PERTUSSIS 5-2.5-18.5 LF-MCG/0.5 IM SUSP: 0.5000 mL | Freq: Once | INTRAMUSCULAR | Status: DC

## 2014-08-31 MED ORDER — FENTANYL CITRATE (PF) 100 MCG/2ML IJ SOLN: 100.0000 ug | Freq: Once | INTRAMUSCULAR | Status: DC

## 2014-08-31 MED ORDER — OXYCODONE-ACETAMINOPHEN 5-325 MG PO TABS: 2.0000 | ORAL_TABLET | ORAL | Status: DC | PRN

## 2014-08-31 MED ORDER — EPHEDRINE 5 MG/ML INJ
10.0000 mg | INTRAVENOUS | Status: DC | PRN
Start: 2014-08-31 — End: 2014-08-31

## 2014-08-31 MED ORDER — ALBUTEROL SULFATE (2.5 MG/3ML) 0.083% IN NEBU: 3.0000 mL | INHALATION_SOLUTION | Freq: Four times a day (QID) | RESPIRATORY_TRACT | Status: DC

## 2014-08-31 MED ORDER — WITCH HAZEL-GLYCERIN EX PADS: 1.0000 "application " | MEDICATED_PAD | CUTANEOUS | Status: DC | PRN

## 2014-08-31 MED ORDER — LIDOCAINE HCL (PF) 1 % IJ SOLN
30.0000 mL | INTRAMUSCULAR | Status: DC | PRN
Start: 2014-08-31 — End: 2014-08-31
  Administered 2014-08-31: 30 mL via SUBCUTANEOUS
  Filled 2014-08-31: qty 30

## 2014-08-31 MED ORDER — ALBUTEROL SULFATE (2.5 MG/3ML) 0.083% IN NEBU: 3.0000 mL | INHALATION_SOLUTION | Freq: Four times a day (QID) | RESPIRATORY_TRACT | Status: DC | PRN

## 2014-08-31 MED ORDER — OXYCODONE-ACETAMINOPHEN 5-325 MG PO TABS
1.0000 | ORAL_TABLET | ORAL | Status: DC | PRN
Start: 2014-08-31 — End: 2014-09-02
  Administered 2014-09-01: 1 via ORAL
  Filled 2014-08-31: qty 1

## 2014-08-31 MED ORDER — SALINE SPRAY 0.65 % NA SOLN
2.0000 | NASAL | Status: DC | PRN
  Administered 2014-08-31: 2 via NASAL
  Filled 2014-08-31: qty 44

## 2014-08-31 MED ORDER — PHENYLEPHRINE 40 MCG/ML (10ML) SYRINGE FOR IV PUSH (FOR BLOOD PRESSURE SUPPORT)
80.0000 ug | PREFILLED_SYRINGE | INTRAVENOUS | Status: DC | PRN
  Filled 2014-08-31: qty 20

## 2014-08-31 MED ORDER — BUPIVACAINE HCL (PF) 0.25 % IJ SOLN
INTRAMUSCULAR | Status: DC | PRN
  Administered 2014-08-31: 4 mL via EPIDURAL
  Administered 2014-08-31: 8 mL via EPIDURAL
  Administered 2014-08-31: 4 mL via EPIDURAL

## 2014-08-31 MED ORDER — LACTATED RINGERS IV SOLN
INTRAVENOUS | Status: DC
  Administered 2014-08-31 (×2): via INTRAVENOUS

## 2014-08-31 MED ORDER — DIPHENHYDRAMINE HCL 25 MG PO CAPS: 25.0000 mg | ORAL_CAPSULE | Freq: Four times a day (QID) | ORAL | Status: DC | PRN

## 2014-08-31 MED ORDER — FENTANYL 2.5 MCG/ML BUPIVACAINE 1/10 % EPIDURAL INFUSION (WH - ANES): 14.0000 mL/h | INTRAMUSCULAR | Status: DC | PRN

## 2014-08-31 MED ORDER — DIBUCAINE 1 % RE OINT: 1.0000 "application " | TOPICAL_OINTMENT | RECTAL | Status: DC | PRN

## 2014-08-31 MED ORDER — SENNOSIDES-DOCUSATE SODIUM 8.6-50 MG PO TABS
2.0000 | ORAL_TABLET | ORAL | Status: DC
  Administered 2014-08-31 – 2014-09-01 (×2): 2 via ORAL
  Filled 2014-08-31 (×2): qty 2

## 2014-08-31 MED ORDER — OXYTOCIN 40 UNITS IN LACTATED RINGERS INFUSION - SIMPLE MED
1.0000 m[IU]/min | INTRAVENOUS | Status: DC
Start: 2014-08-31 — End: 2014-08-31
  Administered 2014-08-31: 1 m[IU]/min via INTRAVENOUS
  Filled 2014-08-31: qty 1000

## 2014-08-31 MED ORDER — OXYTOCIN 40 UNITS IN LACTATED RINGERS INFUSION - SIMPLE MED: 62.5000 mL/h | INTRAVENOUS | Status: DC

## 2014-08-31 NOTE — H&P (Signed)
NAME:  Wanda Nixon, Wanda Nixon NO.:  [id number]  MEDICAL RECORD NO.:  [id number]  LOCATION:  9170                          FACILITY:  [facility]  PHYSICIAN:  Lucille Passy. Ulanda Edison, M.D. DATE OF BIRTH:  01/07/1991  DATE OF ADMISSION:  08/31/2014 DATE OF DISCHARGE:                             HISTORY & PHYSICAL   This is a 24 year old black female, para 0, gravida 1, EDC Sep 10, 2014, admitted for premature rupture of the membranes.  Blood group and type O positive, negative antibody, RPR negative.  Urine culture negative. Hepatitis B surface antigen negative, HIV negative.  GC and Chlamydia negative.  Varicella immune.  Rubella immune.  Hemoglobin AA.  Pap test normal.  Patient declines screening test.  One-hour Glucola 85.  Group B strep was negative and repeat HIV and RPR negative as well as GC and Chlamydia.  The patient began her prenatal course early in pregnancy. Ultrasound was done at 9 weeks and 1 day, gave a due date of Sep 10, 2014.  The patient demonstrated significant anxiety throughout the entire pregnancy.  She elected not to have screening tests.  Patient had a tooth extracted at approximately [redacted] weeks gestation.  At 13 week, she had a significant headache, she was given ibuprofen and then Tylenol No. 3.  The patient complained at 18 weeks, that it felt like air was coming through her cervix.  She also complained of leaking fluid in the second trimester.  No abnormalities were ever found, at 26 weeks complained of strong abdominal cramping and also feeling like a baby would pop out at 26 weeks.  She continued to have multiple complaints throughout the pregnancy with nothing specific found.  Recently, she has had visits to the office and to the Maternity Admission Unit with complaints that the baby would feel like it would pop out and also feeling that she had leaked fluid.  No fluid leakage was found.  She came to the hospital the day prior to admission and a nonstress  test was not fully reactive, so BPP was done that was 6/8.  The patient called this morning at 3:30 to 4 a.m. saying that her membranes had ruptured.  She came to the hospital. Rupture of membranes was confirmed and she was admitted.  PAST MEDICAL HISTORY:  The patient's past medical history revealed she was evaluated for headaches in June 2015, given Maxalt.  SURGICAL HISTORY:  Tubes in her ears, wisdom teeth extracted.  ALLERGIES:  No known drug allergies, but ALLERGIC TO TOMATOES AND MUSHROOMS.  FAMILY HISTORY:  Mother with high blood pressure.  Father with heart disease.  Maternal grandmother, diabetes and stroke.  SOCIAL HISTORY:  The patient never smoked.  Does not drink.  Does not take illegal drugs,  claims to have had 2 years at [facility].  PHYSICAL EXAMINATION:  VITAL SIGNS:  On admission, the blood pressure recently was 135/95, this will be watched.  Her temperature was 98.6, pulse was 107, respirations 20. HEART:  Normal size and sounds.  No murmurs. LUNGS:  Clear to auscultation.  PELVIC:  Fundal height at her last prenatal visit on August 29, 2014, was 39 cm.  Fetal heart  tones are normal.  The patient is on Pitocin, her contractions every 2-3 minutes. Cervix is 2+ cm, 60% effaced, vertex is at a -3 station.  ADMITTING IMPRESSION:  Intrauterine pregnancy 38 weeks and 4 days, premature rupture of the membranes.  The patient is on Pitocin.  She requested epidural, now, I have approved the epidural.     Lucille Passy. Ulanda Edison, M.D.     TFH/MEDQ  D:  08/31/2014  T:  08/31/2014  Job:  [id number]

## 2014-08-31 NOTE — Progress Notes (Signed)
Patient ID: Wanda Nixon, female   DOB: [birth year], 24 y.o.   MRN: [id number] Pt is on 7 mu/minute of pitocin and contractions are q 2-3 minutes. There are mild variable decelerations The cervix is 3-4 cm 80% effaced and the vertex is at - 1 station.

## 2014-08-31 NOTE — MAU Note (Signed)
Hinton Dyer RN in [facility] called with report. Pt to 170 from Triage via w/c for further eval of SROM and labor

## 2014-08-31 NOTE — Progress Notes (Signed)
Dr. Ermalene Postin in to dose pt. She says she is having back pain.

## 2014-08-31 NOTE — Anesthesia Preprocedure Evaluation (Signed)
Anesthesia Evaluation  Patient identified by MRN, date of birth, ID band Patient awake    Reviewed: Allergy & Precautions, NPO status , Patient's Chart, lab work & pertinent test results  History of Anesthesia Complications Negative for: history of anesthetic complications  Airway Mallampati: II  TM Distance: >3 FB Neck ROM: Full    Dental  (+) Teeth Intact   Pulmonary asthma ,  breath sounds clear to auscultation        Cardiovascular negative cardio ROS  Rhythm:Regular     Neuro/Psych  Headaches, Anxiety    GI/Hepatic negative GI ROS, Neg liver ROS,   Endo/Other  negative endocrine ROS  Renal/GU negative Renal ROS     Musculoskeletal   Abdominal   Peds  Hematology negative hematology ROS (+)   Anesthesia Other Findings   Reproductive/Obstetrics                             Anesthesia Physical Anesthesia Plan  ASA: II  Anesthesia Plan: Epidural   Post-op Pain Management:    Induction:   Airway Management Planned:   Additional Equipment:   Intra-op Plan:   Post-operative Plan:   Informed Consent: I have reviewed the patients History and Physical, chart, labs and discussed the procedure including the risks, benefits and alternatives for the proposed anesthesia with the patient or authorized representative who has indicated his/her understanding and acceptance.     Plan Discussed with: Anesthesiologist  Anesthesia Plan Comments:         Anesthesia Quick Evaluation

## 2014-08-31 NOTE — Progress Notes (Signed)
rn IN ROOM AT PT ADMISSION TO ROOM 118, PT VERY ANXIOUS AND APPREHENSIVE AND REGARDING BLEEDING AFTER DELIVERY. PITOCIN STOPPED PER TRANSFER RN DUE TO PT C/O PAIN AT IV SITE AND EDEMA AT SITE. PT STATED "IS MY BLEEDING OK, AM I BLEEDING TO MUCH" EXPLAINED TO PT BLEEDING IS NORMAL AND MINIMAL AT PRESENT, AND THIS RN WILL RECHECK BLEEDING IN ONE HOUR. PLAN OF CARE DISCUSSED WITH PT. PT REASSURED BY RN EVERYTHING OK AND BODY IS TRYING TO RETURN TO PRE- PREGNANCY STATE. PT COLD TO TOUCH AND NERVOUS. TEMP 100.3 . WILL RECHECK IN ONE HOUR. FAMILY IN ROOM . PT ORIENTED TO ROOM AND SURROUNDINGS AND CALL LIGHT AND PHONE AND EMERGENCY CALL LIGHT. PT SHOWN HOW TO ORDER FOOD . PT CALMINING DOWN AND LESS APPREHENSIVE AFTER PLAN OF CARE DISCUSSED NAD EXPLAINED THIS IS NORMAL POST-PARTUM EVENTS. CENTRAL NURSERY RN IN ROOM EXPLAINING CARE OF NEWBORN AND ORIENTATION TO CRIB . AND LAB WORK ON INFANT. IV STOPPED AT PRESENT , WILL RECHECK VAGINAL BLEEDING IN ONE HOUR . FAMILY GIVEN TIME TO VISIT. EXPLAINED TO PT TO CALL OUT IF NEEDING ANY THING. PT VERBALIZED UNDERSTANDING

## 2014-08-31 NOTE — Progress Notes (Signed)
RN CALLED DR. HENLEY AGAIN AND SPOKE DIRECTLY TO HIM. RN DISCUSSED ORDERS REGARDING INHALERS AND ORDERS CHANGE TO PRN STATUS. DR. Chesley Noon ON PT CONDITION- BLEEDING , LUNGS CLEAR, TEMP 100.3 X 2 AND MOTRIN GIVEN AND PT SWEATING AND HOT FLASHES. PLAN OF CARE TO JUST MONITOR PT FOR NOW. NO OTHER CHANGES IN ORDERS

## 2014-08-31 NOTE — MAU Note (Signed)
Pt to BR first to change clothes due to be wet from SROM. SROM about 0330. Cl flds. Mild cramping

## 2014-08-31 NOTE — Progress Notes (Signed)
Patient ID: Wanda Nixon, female   DOB: April 21, 1991, 24 y.o.   MRN: [id number] Pt became 9 cm at 4:04 and is still 8-9 cm with a fat anterior lip and the vertex is at + 1 station. The pitocin is at 6 mu/ minute. Will try increasing the pitocin.

## 2014-08-31 NOTE — Progress Notes (Signed)
Patient ID: Wanda Nixon, female   DOB: Jan 21, 1991, 24 y.o.   MRN: [id number] I was called to see pt because of decelerations of the FHR unresponsive to position change so pitocin was d/c ed. When I arrived the FHR was normal. The pitocin which had been at 8 mu/minute was restarted at 4 mu/minute. The cervix is 6 cm , the anterior lip is swollen and the vertex is at - 1 station. I will see how the baby tolerates a lower dose of pitocin and monitor progress. Her labs are normal but her BP is borderline high. The uterus is soft

## 2014-08-31 NOTE — Progress Notes (Signed)
Patient ID: Wanda Nixon, female   DOB: 1991/01/06, 24 y.o.   MRN: [id number] Pt admitted at 38+ weeks with PROM. She is on pitocin at 5 mu/ minutes and the contractions are q 2-3 minutes Cervix is 2+ cm 60 % EFFACED and the vertex is at - 3 station. She requests an epidural

## 2014-08-31 NOTE — Anesthesia Procedure Notes (Signed)
Epidural Patient location during procedure: OB  Staffing Anesthesiologist: Ezrael Sam, CHRIS Performed by: anesthesiologist   Preanesthetic Checklist Completed: patient identified, surgical consent, pre-op evaluation, timeout performed, IV checked, risks and benefits discussed and monitors and equipment checked  Epidural Patient position: sitting Prep: site prepped and draped and DuraPrep Patient monitoring: heart rate, cardiac monitor, continuous pulse ox and blood pressure Approach: midline Location: L3-L4 Injection technique: LOR saline  Needle:  Needle type: Tuohy  Needle gauge: 17 G Needle length: 9 cm Needle insertion depth: 6 cm Catheter type: closed end flexible Catheter size: 19 Gauge Catheter at skin depth: 12 cm Test dose: negative and 2% lidocaine with Epi 1:200 K  Assessment Events: blood not aspirated, injection not painful, no injection resistance, negative IV test and no paresthesia  Additional Notes H+P and labs checked, risks and benefits discussed with the patient, consent obtained, procedure tolerated well and without complications.  Reason for block:procedure for pain

## 2014-08-31 NOTE — Progress Notes (Signed)
Patient ID: Wanda Nixon, female   DOB: 05-15-1990, 24 y.o.   MRN: [id number] Delivery note:  She progressed to full dilatation an with the head on the perineum delivered a living female infant spontaneously LOA over a second degree ML laceration with one contraction. Apgars were 9 and 9 at 1 and 5 minutes. The placenta was intact and the uterus was normal. The laceration was repaired with 3-0 vicryl under local block. EBL 500 cc's.

## 2014-09-01 LAB — CBC WITH DIFFERENTIAL/PLATELET
BASOS ABS: 0 10*3/uL (ref 0.0–0.1)
Basophils Relative: 0 % (ref 0–1)
Eosinophils Absolute: 0 10*3/uL (ref 0.0–0.7)
Eosinophils Relative: 0 % (ref 0–5)
HEMATOCRIT: 27.4 % — AB (ref 36.0–46.0)
Hemoglobin: 9.5 g/dL — ABNORMAL LOW (ref 12.0–15.0)
Lymphocytes Relative: 10 % — ABNORMAL LOW (ref 12–46)
Lymphs Abs: 1.3 10*3/uL (ref 0.7–4.0)
MCH: 29 pg (ref 26.0–34.0)
MCHC: 34.7 g/dL (ref 30.0–36.0)
MCV: 83.5 fL (ref 78.0–100.0)
MONOS PCT: 9 % (ref 3–12)
Monocytes Absolute: 1.1 10*3/uL — ABNORMAL HIGH (ref 0.1–1.0)
Neutro Abs: 10.1 10*3/uL — ABNORMAL HIGH (ref 1.7–7.7)
Neutrophils Relative %: 81 % — ABNORMAL HIGH (ref 43–77)
Platelets: 227 10*3/uL (ref 150–400)
RBC: 3.28 MIL/uL — ABNORMAL LOW (ref 3.87–5.11)
RDW: 14.2 % (ref 11.5–15.5)
WBC: 12.6 10*3/uL — AB (ref 4.0–10.5)

## 2014-09-01 NOTE — Lactation Note (Signed)
This note was copied from the chart of Wanda Damian Teem. Lactation Consultation Note Follow up visit after previous [place] just left room.  Mom reports wanting to pump and maybe just bottle feed.  Discussed benefits of establishing a good milk supply and exclusively offering breastmilk if she collects enough with pumping.  Mom to call with pumping to make sure flanges were the right size.    Patient Name: Wanda Nixon [id number] Date: 09/01/2014 Reason for consult: Initial assessment   Maternal Data Has patient been taught Hand Expression?: Yes (can express colostrum) Does the patient have breastfeeding experience prior to this delivery?: No  Feeding Feeding Type: Breast Fed Length of feed: 20 min  LATCH Score/Interventions Latch: Repeated attempts needed to sustain latch, nipple held in mouth throughout feeding, stimulation needed to elicit sucking reflex. Intervention(s): Assist with latch  Audible Swallowing: A few with stimulation Intervention(s): Skin to skin;Hand expression  Type of Nipple: Everted at rest and after stimulation  Comfort (Breast/Nipple): Soft / non-tender     Hold (Positioning): Assistance needed to correctly position infant at breast and maintain latch. Intervention(s): Breastfeeding basics reviewed;Support Pillows;Position options;Skin to skin  LATCH Score: 7  Lactation Tools Discussed/Used Tools: Other (comment) (#20 nipple shield attempted) Pump Review: Setup, frequency, and cleaning Initiated by:: BDalyRN Date initiated:: 09/01/14   Consult Status Consult Status: Follow-up    Shoptaw, Justine Null 09/01/2014, 4:37 PM

## 2014-09-01 NOTE — Progress Notes (Signed)
Patient ID: Wanda Nixon, female   DOB: 07/16/1990, 24 y.o.   MRN: [id number] #1 afebrile BP normal HGB 11.2 to 9.5. Had temp to 100.3 on one occasion Will observe

## 2014-09-01 NOTE — Progress Notes (Signed)
UR chart review completed.  

## 2014-09-01 NOTE — Progress Notes (Signed)
CLINICAL SOCIAL WORK MATERNAL/CHILD NOTE  Patient Details  Name: Wanda Nixon MRN: [id number] Date of Birth: 08/31/2013  Date:  09/01/2014  Clinical Social Worker Initiating Note:  Lucita Ferrara, LCSW Date/ Time Initiated:  09/01/14/1000     Child's Name:  Wanda Nixon   Legal Guardian:  Parents: Wanda Nixon   Need for Interpreter:  None   Date of Referral:  08/31/14     Reason for Referral:  History of anxiety  Referral Source:  Central Nursery   Address:  [address] [place], [place] [address]  Phone number:  [phone]   Household Members:  Spouse   Natural Supports (not living in the home):  [place], Extended Family, Immediate Family   Professional Supports: None   Employment: Full-time   Type of Work: Did not assess  Education:  N/A  Pensions consultant:  Medicaid   Other Resources:  None reported  Cultural/Religious Considerations Which May Impact Care: None reported  Strengths:  Ability to meet basic needs , Home prepared for child    Risk Factors/Current Problems:  Mental Health Concerns: MOB presents with history of anxiety that led to numerous ED visits in June-July 2015.     Cognitive State:  Able to Concentrate , Alert , Goal Oriented , Insightful , Linear Thinking  CSW noted no anxiety in her thought process.    Mood/Affect:  Calm , Comfortable , Happy  No physical symptoms of anxiety noted.    CSW Assessment:  CSW met with MOB due to history of anxiety.  MOB presented with numerous visits to the ED June-July 2015.  H&P from this admission also documents that MOB has history of anxiety during the pregnancy.  Prior to meeting with MOB, CSW spoke with bedside RN who reported that MOB does not display acute anxiety at this time. She stated that the MOB is asking appropriate and anticipated questions for a first time mother.  FOB was holding and attending to the infant during the [place] visit, but MOB was noted to look at the infant and  smile when she reflected upon how she feels to become a mother.   MOB denied questions, concerns, or needs at this time. She expressed feeling excited and looking forward to the transition to the postpartum period. She discussed normative physical discomfort toward the end of the pregnancy, and shared that it is a relief to no longer be pregnancy. MOB reported that the home is prepared for the infant and that they have an "excellent" support system.  She and the FOB identified numerous immediate and extended family members who are supportive. They discussed of utilizing supports in upcoming weeks in order to ensure that they get sufficient sleep.  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  She shared that she feels comfortable, but continues to feel appropriately nervous since this is her first child. No anxiety noted as she discussed how she feels as she prepares to discharge home.   MOB presented with insight and awareness of need to take care of herself in upcoming weeks.  She acknowledged history of anxiety, and stated that it was primarily related to wedding plan stress that occurred last summer.  MOB denied any symptoms of anxiety or panic attacks during the pregnancy, and the FOB agreed that her symptoms appear closely linked/isolated to the wedding plan.  MOB shared that she is aware of symptoms postpartum depression and anxiety to watch  for in the postpartum period.  CSW provided education, and discussed the range and spectrum of symptoms that may occur.  CSW continued to explore with MOB symptoms that may indicate that it is outside the normal range of symptoms for first time parents, and recommended that MOB contact her OB if she notes that her symptoms are negatively impacting her daily life and impacting her ability to engage in activities of daily living.  MOB agreed to contact her OB if needs arise, and recognized numerous activities  that she can engage in that may assist her with self-care in upcoming weeks.   MOB denied additional questions, concerns, or needs at this time. She agreed to contact CSW if needs arise.   CSW Plan/Description:   1)Patient/Family Education: Postpartum depression and anxiety 2)No Further Intervention Required/No Barriers to Discharge    Sharyl Nimrod 09/01/2014, 12:04 PM

## 2014-09-01 NOTE — Anesthesia Postprocedure Evaluation (Signed)
  Anesthesia Post-op Note  Patient: Wanda Nixon  Procedure(s) Performed: * No procedures listed *  Patient Location: PACU and Mother/Baby  Anesthesia Type:Epidural  Level of Consciousness: awake, alert  and oriented  Airway and Oxygen Therapy: Patient Spontanous Breathing  Post-op Pain: none  Post-op Assessment: Post-op Vital signs reviewed and Patient's Cardiovascular Status Stable  Post-op Vital Signs: Reviewed and stable  Last Vitals:  Filed Vitals:   09/01/14 0642  BP: 119/84  Pulse: 68  Temp: 36.8 C  Resp: 19    Complications: No apparent anesthesia complications

## 2014-09-01 NOTE — Progress Notes (Signed)
Attended 'Well After Birth' group class which presented discharge care information for both Mom and Baby. 

## 2014-09-01 NOTE — Lactation Note (Signed)
This note was copied from the chart of Wanda Nixon. Lactation Consultation Note  Patient Name: Wanda Nixon [id number]'I Date: 09/01/2014 Reason for consult: Initial assessment Initial lactation visit. Baby is tongue sucking and thrusting causing baby to have a shallow latch on the breast. Suck training techniques taught to parents. Mother was able to hand express into a spoon and baby spoon fed 2-3 ml. Baby extended tongue towards spoon to get milk. Oral gum massage and sucking on gloved finger taught to parents. Baby is pinching down on gloved finger. Assisted with latch in football, cross cradle, and laid back feeding. Baby latching on and pulling off the breast. #20 nipple shield was applied after education with parents. Baby suckled strong enough to extend nipple in the shield and see colostrum but latch remained very shallow. Shield removed and mother reclined and baby assisted in laid back position allowing baby to self latch. Baby latched better, still shallow but sucking calmly with swallows and mother denied pain. Baby fed for 5-7 minutes. Overall, baby fed on and off the breast with spoon supplement for 20 minutes. Mother wanted instructions with her own DEBP that she brought from home. Mother taught how to pump with FOB very attentive. Mother very concerned that baby is not getting enough. Pumping advised following feeding and to give EBM via spoon or curved tip syringe. To call nurse for assistance. Mother is presently very tired and hungry. Baby resting calm and alert on mother's chest, skin to skin. Mom made aware of O/P services, breastfeeding support groups, community resources, and our phone # for post-discharge questions.   Maternal Data Has patient been taught Hand Expression?: Yes (can express colostrum) Does the patient have breastfeeding experience prior to this delivery?: No  Feeding Feeding Type: Breast Fed Length of feed: 20 min  LATCH Score/Interventions Latch:  Repeated attempts needed to sustain latch, nipple held in mouth throughout feeding, stimulation needed to elicit sucking reflex. Intervention(s): Assist with latch  Audible Swallowing: A few with stimulation Intervention(s): Skin to skin;Hand expression  Type of Nipple: Everted at rest and after stimulation  Comfort (Breast/Nipple): Soft / non-tender     Hold (Positioning): Assistance needed to correctly position infant at breast and maintain latch. Intervention(s): Breastfeeding basics reviewed;Support Pillows;Position options;Skin to skin  LATCH Score: 7  Lactation Tools Discussed/Used Tools: Other (comment) (#20 nipple shield attempted) Pump Review: Setup, frequency, and cleaning Initiated by:: BDalyRN Date initiated:: 09/01/14   Consult Status Consult Status: Follow-up    Stana Bunting M 09/01/2014, 4:27 PM

## 2014-09-02 MED ORDER — PRENATAL MULTIVITAMIN CH: 1.0000 | ORAL_TABLET | Freq: Every day | ORAL | Status: DC

## 2014-09-02 MED ORDER — OXYCODONE-ACETAMINOPHEN 5-325 MG PO TABS: 1.0000 | ORAL_TABLET | Freq: Four times a day (QID) | ORAL | Status: DC | PRN

## 2014-09-02 MED ORDER — COMPLETENATE 29-1 MG PO CHEW
1.0000 | CHEWABLE_TABLET | Freq: Every day | ORAL | Status: DC
  Filled 2014-09-02 (×2): qty 1

## 2014-09-02 MED ORDER — IBUPROFEN 600 MG PO TABS: 600.0000 mg | ORAL_TABLET | Freq: Four times a day (QID) | ORAL | Status: DC | PRN

## 2014-09-02 NOTE — Discharge Summary (Signed)
NAME:  Wanda Nixon, Wanda Nixon NO.:  [id number]  MEDICAL RECORD NO.:  [id number]  LOCATION:  9118                          FACILITY:  [facility]  PHYSICIAN:  Lucille Passy. Ulanda Edison, M.D. DATE OF BIRTH:  [birth year]  DATE OF ADMISSION:  08/31/2014 DATE OF DISCHARGE:  09/02/2014                              DISCHARGE SUMMARY   HOSPITAL COURSE:  This is a 24 year old black female, para 0, gravida 1, EDC Sep 10, 2014, admitted with premature rupture of the membranes. After admission, she was placed on Pitocin.  She made some progress, received an epidural, and I was called to see the patient at 3:08 p.m. because of decelerations of the fetal heart rate.  It responded to stopping Pitocin and position change.  She became 9 cm at 4:04 p.m., remained 9 cm at 4:59 p.m., then she progressed to full dilatation and with the head on the perineum.  Pushed well and delivered a living female infant 5 pounds 12 ounces spontaneously LOA over a second-degree midline laceration with one contraction.  Dr. Ulanda Edison was in attendance. Apgars were 9 and 9 at 1 and 5 minutes.  Placenta was intact.  Uterus normal.  Laceration repaired with 3-0 Vicryl on the local block.  Blood loss about 500 mL.  Postpartum, the patient did well and was discharged on the second postpartum day.  Initial hemoglobin 11.2, hematocrit 32.6, white count 6400, platelet count 272,000, followup hemoglobin was 9.5.  FINAL DIAGNOSES:  Intrauterine pregnancy at 38 weeks and 4 days, delivered left occiput anterior, premature rupture of the membranes.  OPERATION:  Spontaneous delivery, LOA, midline laceration second-degree and repair.  FINAL CONDITION:  Improved.  INSTRUCTIONS:  Include our regular discharge instruction booklet the after visit summary.  Prescriptions for Percocet 5/325, 30 tablets, 1 every 6 hours as needed for pain; Motrin 600 mg 30 tablets, 1 every 6 hours as needed for pain, and she is to continue her prenatal  vitamins daily.  She is to return to the office in 6 weeks for followup examination.     Lucille Passy. Ulanda Edison, M.D.     TFH/MEDQ  D:  09/02/2014  T:  09/02/2014  Job:  [id number]

## 2014-09-02 NOTE — Lactation Note (Signed)
This note was copied from the chart of Wanda Hoda [place]. Lactation Consultation Note  FOB feeding baby bottle of formula and recently gave baby 21ml of pumped breastmilk. Reviewed making sure she pumps every 3 hours with the exception of once during the night. Mother has DEBP.   Reviewed engorgement care, mastitis and monitoring voids/stools. Mom encouraged to feed baby 8-12 times/24 hours and with feeding cues.    Patient Name: Wanda Nixon [id number] Date: 09/02/2014 Reason for consult: Follow-up assessment   Maternal Data    Feeding Feeding Type: Bottle Fed - Formula  LATCH Score/Interventions                      Lactation Tools Discussed/Used     Consult Status      Carlye Grippe 09/02/2014, 10:51 AM

## 2014-09-02 NOTE — Discharge Instructions (Signed)
booklet °

## 2014-09-02 NOTE — Progress Notes (Signed)
Patient ID: Wanda Nixon, female   DOB: 1990-06-15, 25 y.o.   MRN: [id number] #2 afebrile BP normal no complaints ready for d/c.

## 2014-09-04 ENCOUNTER — Inpatient Hospital Stay (HOSPITAL_COMMUNITY): Payer: Medicaid Other

## 2014-09-04 NOTE — Progress Notes (Signed)
Post discharge chart review completed.  

## 2014-09-05 ENCOUNTER — Encounter (HOSPITAL_COMMUNITY): Payer: Medicaid Other

## 2014-09-05 ENCOUNTER — Inpatient Hospital Stay (HOSPITAL_COMMUNITY): Admit: 2014-09-05 | Payer: Medicaid Other

## 2014-12-18 ENCOUNTER — Inpatient Hospital Stay (HOSPITAL_COMMUNITY)
Admission: AD | Admit: 2014-12-18 | Discharge: 2014-12-18 | Disposition: A | Discharge status: Home / Self Care | Payer: Medicaid Other | Source: Ambulatory Visit | Attending: Obstetrics and Gynecology | Admitting: Obstetrics and Gynecology

## 2014-12-18 ENCOUNTER — Encounter (HOSPITAL_COMMUNITY): Payer: Self-pay | Admitting: Advanced Practice Midwife

## 2014-12-18 DIAGNOSIS — N911 Secondary amenorrhea: Secondary | ICD-10-CM

## 2014-12-18 DIAGNOSIS — Z79899 Other long term (current) drug therapy: Secondary | ICD-10-CM | POA: Insufficient documentation

## 2014-12-18 DIAGNOSIS — Z3201 Encounter for pregnancy test, result positive: Secondary | ICD-10-CM

## 2014-12-18 DIAGNOSIS — F419 Anxiety disorder, unspecified: Secondary | ICD-10-CM | POA: Insufficient documentation

## 2014-12-18 DIAGNOSIS — Z3200 Encounter for pregnancy test, result unknown: Secondary | ICD-10-CM | POA: Diagnosis present

## 2014-12-18 LAB — POCT PREGNANCY, URINE: Preg Test, Ur: POSITIVE — AB

## 2014-12-18 MED ORDER — PRENATAL MULTIVITAMIN CH: 1.0000 | ORAL_TABLET | Freq: Every day | ORAL | Status: DC

## 2014-12-18 NOTE — Discharge Instructions (Signed)
First Trimester of Pregnancy The first trimester of pregnancy is from week 1 until the end of week 12 (months 1 through 3). A week after a sperm fertilizes an egg, the egg will implant on the wall of the uterus. This embryo will begin to develop into a baby. Genes from you and your partner are forming the baby. The female genes determine whether the baby is a boy or a girl. At 6-8 weeks, the eyes and face are formed, and the heartbeat can be seen on ultrasound. At the end of 12 weeks, all the baby's organs are formed.  Now that you are pregnant, you will want to do everything you can to have a healthy baby. Two of the most important things are to get good prenatal care and to follow your health care provider's instructions. Prenatal care is all the medical care you receive before the baby's birth. This care will help prevent, find, and treat any problems during the pregnancy and childbirth. BODY CHANGES Your body goes through many changes during pregnancy. The changes vary from woman to woman.   You may gain or lose a couple of pounds at first.  You may feel sick to your stomach (nauseous) and throw up (vomit). If the vomiting is uncontrollable, call your health care provider.  You may tire easily.  You may develop headaches that can be relieved by medicines approved by your health care provider.  You may urinate more often. Painful urination may mean you have a bladder infection.  You may develop heartburn as a result of your pregnancy.  You may develop constipation because certain hormones are causing the muscles that push waste through your intestines to slow down.  You may develop hemorrhoids or swollen, bulging veins (varicose veins).  Your breasts may begin to grow larger and become tender. Your nipples may stick out more, and the tissue that surrounds them (areola) may become darker.  Your gums may bleed and may be sensitive to brushing and flossing.  Dark spots or blotches (chloasma,  mask of pregnancy) may develop on your face. This will likely fade after the baby is born.  Your menstrual periods will stop.  You may have a loss of appetite.  You may develop cravings for certain kinds of food.  You may have changes in your emotions from day to day, such as being excited to be pregnant or being concerned that something may go wrong with the pregnancy and baby.  You may have more vivid and strange dreams.  You may have changes in your hair. These can include thickening of your hair, rapid growth, and changes in texture. Some women also have hair loss during or after pregnancy, or hair that feels dry or thin. Your hair will most likely return to normal after your baby is born. WHAT TO EXPECT AT YOUR PRENATAL VISITS During a routine prenatal visit:  You will be weighed to make sure you and the baby are growing normally.  Your blood pressure will be taken.  Your abdomen will be measured to track your baby's growth.  The fetal heartbeat will be listened to starting around week 10 or 12 of your pregnancy.  Test results from any previous visits will be discussed. Your health care provider may ask you:  How you are feeling.  If you are feeling the baby move.  If you have had any abnormal symptoms, such as leaking fluid, bleeding, severe headaches, or abdominal cramping.  If you have any questions. Other tests   that may be performed during your first trimester include:  Blood tests to find your blood type and to check for the presence of any previous infections. They will also be used to check for low iron levels (anemia) and Rh antibodies. Later in the pregnancy, blood tests for diabetes will be done along with other tests if problems develop.  Urine tests to check for infections, diabetes, or protein in the urine.  An ultrasound to confirm the proper growth and development of the baby.  An amniocentesis to check for possible genetic problems.  Fetal screens for  spina bifida and Down syndrome.  You may need other tests to make sure you and the baby are doing well. HOME CARE INSTRUCTIONS  Medicines  Follow your health care provider's instructions regarding medicine use. Specific medicines may be either safe or unsafe to take during pregnancy.  Take your prenatal vitamins as directed.  If you develop constipation, try taking a stool softener if your health care provider approves. Diet  Eat regular, well-balanced meals. Choose a variety of foods, such as meat or vegetable-based protein, fish, milk and low-fat dairy products, vegetables, fruits, and whole grain breads and cereals. Your health care provider will help you determine the amount of weight gain that is right for you.  Avoid raw meat and uncooked cheese. These carry germs that can cause birth defects in the baby.  Eating four or five small meals rather than three large meals a day may help relieve nausea and vomiting. If you start to feel nauseous, eating a few soda crackers can be helpful. Drinking liquids between meals instead of during meals also seems to help nausea and vomiting.  If you develop constipation, eat more high-fiber foods, such as fresh vegetables or fruit and whole grains. Drink enough fluids to keep your urine clear or pale yellow. Activity and Exercise  Exercise only as directed by your health care provider. Exercising will help you:  Control your weight.  Stay in shape.  Be prepared for labor and delivery.  Experiencing pain or cramping in the lower abdomen or low back is a good sign that you should stop exercising. Check with your health care provider before continuing normal exercises.  Try to avoid standing for long periods of time. Move your legs often if you must stand in one place for a long time.  Avoid heavy lifting.  Wear low-heeled shoes, and practice good posture.  You may continue to have sex unless your health care provider directs you  otherwise. Relief of Pain or Discomfort  Wear a good support bra for breast tenderness.   Take warm sitz baths to soothe any pain or discomfort caused by hemorrhoids. Use hemorrhoid cream if your health care provider approves.   Rest with your legs elevated if you have leg cramps or low back pain.  If you develop varicose veins in your legs, wear support hose. Elevate your feet for 15 minutes, 3-4 times a day. Limit salt in your diet. Prenatal Care  Schedule your prenatal visits by the twelfth week of pregnancy. They are usually scheduled monthly at first, then more often in the last 2 months before delivery.  Write down your questions. Take them to your prenatal visits.  Keep all your prenatal visits as directed by your health care provider. Safety  Wear your seat belt at all times when driving.  Make a list of emergency phone numbers, including numbers for family, friends, the hospital, and police and fire departments. General Tips    Ask your health care provider for a referral to a local prenatal education class. Begin classes no later than at the beginning of month 6 of your pregnancy.  Ask for help if you have counseling or nutritional needs during pregnancy. Your health care provider can offer advice or refer you to specialists for help with various needs.  Do not use hot tubs, steam rooms, or saunas.  Do not douche or use tampons or scented sanitary pads.  Do not cross your legs for long periods of time.  Avoid cat litter boxes and soil used by cats. These carry germs that can cause birth defects in the baby and possibly loss of the fetus by miscarriage or stillbirth.  Avoid all smoking, herbs, alcohol, and medicines not prescribed by your health care provider. Chemicals in these affect the formation and growth of the baby.  Schedule a dentist appointment. At home, brush your teeth with a soft toothbrush and be gentle when you floss. SEEK MEDICAL CARE IF:   You have  dizziness.  You have mild pelvic cramps, pelvic pressure, or nagging pain in the abdominal area.  You have persistent nausea, vomiting, or diarrhea.  You have a bad smelling vaginal discharge.  You have pain with urination.  You notice increased swelling in your face, hands, legs, or ankles. SEEK IMMEDIATE MEDICAL CARE IF:   You have a fever.  You are leaking fluid from your vagina.  You have spotting or bleeding from your vagina.  You have severe abdominal cramping or pain.  You have rapid weight gain or loss.  You vomit blood or material that looks like coffee grounds.  You are exposed to German measles and have never had them.  You are exposed to fifth disease or chickenpox.  You develop a severe headache.  You have shortness of breath.  You have any kind of trauma, such as from a fall or a car accident. Document Released: 04/12/2001 Document Revised: 09/02/2013 Document Reviewed: 02/26/2013 Exit[employer] Patient Information 2015 [employer]. This information is not intended to replace advice given to you by your health care provider. Make sure you discuss any questions you have with your health care provider.  

## 2014-12-18 NOTE — MAU Provider Note (Signed)
Chief Complaint: Possible Pregnancy  First Provider Initiated Contact with Patient 12/18/2014 at 1214.   SUBJECTIVE HPI: Wanda Nixon is a 24 y.o. G1P1001 at 5.4 weeks by LMP who presents to [facility] for pregnancy verification. Denies abd pain or vaginal bleeding. Plans to get prenatal care W/ [facility] when pregnancy Medicaid active.  Past Medical History  Diagnosis Date  . Anxiety   . Headache(784.0)   . Eczema   . Asthma     Last used 08/30/14   OB History  Gravida Para Term Preterm AB SAB TAB Ectopic Multiple Living  2 1 1       0 1    # Outcome Date GA Lbr Len/2nd Weight Sex Delivery Anes PTL Lv  2 Current           1 Term 08/31/14 [redacted]w[redacted]d 14:24 / 00:12 5 lb 12.4 oz (2.62 kg) F Vag-Spont EPI  Y     Past Surgical History  Procedure Laterality Date  . Mouth surgery    . Wisdom tooth extraction    . Tympanostomy tube placement     Social History   Social History  . Marital Status: Married    Spouse Name: N/A  . Number of Children: 0  . Years of Education: college   Occupational History  .      [employer] - Part time   Social History Main Topics  . Smoking status: Never Smoker   . Smokeless tobacco: Never Used  . Alcohol Use: No  . Drug Use: No  . Sexual Activity: Yes    Birth Control/ Protection: None   Other Topics Concern  . Not on file   Social History Narrative   Patient lives at home with her fiance. Patient works at [employer] part time.   Education two years of college.   Right handed.   Caffeine - None    No current facility-administered medications on file prior to encounter.   Current Outpatient Prescriptions on File Prior to Encounter  Medication Sig Dispense Refill  . albuterol (PROVENTIL HFA;VENTOLIN HFA) 108 (90 BASE) MCG/ACT inhaler Inhale 1-2 puffs into the lungs every 6 (six) hours as needed for wheezing or shortness of breath.    . hydroxypropyl methylcellulose / hypromellose (ISOPTO TEARS / GONIOVISC) 2.5 % ophthalmic  solution Place 1 drop into both eyes daily as needed for dry eyes.    Marland Kitchen ibuprofen (ADVIL,MOTRIN) 600 MG tablet Take 1 tablet (600 mg total) by mouth every 6 (six) hours as needed for moderate pain. 30 tablet 0  . oxyCODONE-acetaminophen (PERCOCET/ROXICET) 5-325 MG per tablet Take 1 tablet by mouth every 6 (six) hours as needed (for pain scale 4-7). 30 tablet 0  . sodium chloride (OCEAN) 0.65 % SOLN nasal spray Place 2 sprays into both nostrils daily as needed for congestion.     Allergies  Allergen Reactions  . Pulmicort [Budesonide] Palpitations    Racing heart  . Tomato Hives and Itching  . Other Hives    mushrooms    I have reviewed the past Medical Hx, Surgical Hx, Social Hx, Allergies and Medications.   Review of Systems  Constitutional: Negative for fever and chills.  Gastrointestinal: Negative for abdominal pain.  Genitourinary: Negative for vaginal bleeding.    OBJECTIVE Patient Vitals for the past 24 hrs:  BP Temp Temp src Pulse Resp Height Weight  12/18/14 1202 124/94 mmHg 98.7 F (37.1 C) Oral 92 18 4\' 11"  (1.499 m) 133 lb 6.4 oz (60.51 kg)   Constitutional:  Well-developed, well-nourished female in no acute distress.  Cardiovascular: normal rate Respiratory: normal rate and effort.  Neurologic: Alert and oriented x 4.   LAB RESULTS Results for orders placed or performed during the hospital encounter of 12/18/14 (from the past 24 hour(s))  Pregnancy, urine POC     Status: Abnormal   Collection Time: 12/18/14 12:08 PM  Result Value Ref Range   Preg Test, Ur POSITIVE (A) NEGATIVE    IMAGING No results found.  MAU COURSE UPT  MDM UPT pos. No complaints.   ASSESSMENT 1. Secondary amenorrhea   2. Positive pregnancy test     PLAN Discharge home in stable condition. First trimester Precautions Pregnancy verification letter given. Rx PNV.     Follow-up Information    Follow up with MEISINGER,TODD D, MD.   Specialty:  Obstetrics and Gynecology   Why:   Start prenatal care   Contact information:   [address], [address] [place] [address] [phone]       Follow up with [facility].   Why:  As needed in emergencies   Contact information:   [address] [id number] [place] [phone]       Medication List    STOP taking these medications        ibuprofen 600 MG tablet  Commonly known as:  ADVIL,MOTRIN     oxyCODONE-acetaminophen 5-325 MG per tablet  Commonly known as:  PERCOCET/ROXICET      TAKE these medications        albuterol 108 (90 BASE) MCG/ACT inhaler  Commonly known as:  PROVENTIL HFA;VENTOLIN HFA  Inhale 1-2 puffs into the lungs every 6 (six) hours as needed for wheezing or shortness of breath.     hydroxypropyl methylcellulose / hypromellose 2.5 % ophthalmic solution  Commonly known as:  ISOPTO TEARS / GONIOVISC  Place 1 drop into both eyes daily as needed for dry eyes.     prenatal multivitamin Tabs tablet  Take 1 tablet by mouth daily at 12 noon.     sodium chloride 0.65 % Soln nasal spray  Commonly known as:  OCEAN  Place 2 sprays into both nostrils daily as needed for congestion.         [place], [place] 12/18/2014  12:20 PM

## 2014-12-18 NOTE — MAU Note (Signed)
Pt had a positive preg test. Wants to make sure.

## 2014-12-18 NOTE — Plan of Care (Signed)
Pt. Urine in lab 

## 2015-01-12 LAB — OB RESULTS CONSOLE HEPATITIS B SURFACE ANTIGEN: HEP B S AG: NEGATIVE

## 2015-01-12 LAB — OB RESULTS CONSOLE GC/CHLAMYDIA
Chlamydia: NEGATIVE
GC PROBE AMP, GENITAL: NEGATIVE

## 2015-01-12 LAB — OB RESULTS CONSOLE RUBELLA ANTIBODY, IGM: RUBELLA: IMMUNE

## 2015-01-12 LAB — OB RESULTS CONSOLE HIV ANTIBODY (ROUTINE TESTING): HIV: NONREACTIVE

## 2015-01-12 LAB — OB RESULTS CONSOLE RPR: RPR: NONREACTIVE

## 2015-03-23 ENCOUNTER — Encounter (HOSPITAL_COMMUNITY): Payer: Self-pay | Admitting: *Deleted

## 2015-03-23 ENCOUNTER — Inpatient Hospital Stay (HOSPITAL_COMMUNITY)
Admission: AD | Admit: 2015-03-23 | Discharge: 2015-03-23 | Disposition: A | Discharge status: Home / Self Care | Payer: Medicaid Other | Source: Ambulatory Visit | Attending: Obstetrics and Gynecology | Admitting: Obstetrics and Gynecology

## 2015-03-23 DIAGNOSIS — O26892 Other specified pregnancy related conditions, second trimester: Secondary | ICD-10-CM

## 2015-03-23 DIAGNOSIS — Z3A19 19 weeks gestation of pregnancy: Secondary | ICD-10-CM | POA: Diagnosis not present

## 2015-03-23 DIAGNOSIS — R51 Headache: Secondary | ICD-10-CM | POA: Diagnosis present

## 2015-03-23 DIAGNOSIS — Z7982 Long term (current) use of aspirin: Secondary | ICD-10-CM | POA: Diagnosis not present

## 2015-03-23 DIAGNOSIS — Z3492 Encounter for supervision of normal pregnancy, unspecified, second trimester: Secondary | ICD-10-CM

## 2015-03-23 DIAGNOSIS — R1030 Lower abdominal pain, unspecified: Secondary | ICD-10-CM | POA: Insufficient documentation

## 2015-03-23 LAB — WET PREP, GENITAL
Clue Cells Wet Prep HPF POC: NONE SEEN
Sperm: NONE SEEN
TRICH WET PREP: NONE SEEN
Yeast Wet Prep HPF POC: NONE SEEN

## 2015-03-23 LAB — URINALYSIS, ROUTINE W REFLEX MICROSCOPIC
BILIRUBIN URINE: NEGATIVE
Glucose, UA: NEGATIVE mg/dL
Hgb urine dipstick: NEGATIVE
KETONES UR: NEGATIVE mg/dL
Leukocytes, UA: NEGATIVE
NITRITE: NEGATIVE
PROTEIN: NEGATIVE mg/dL
Specific Gravity, Urine: 1.02 (ref 1.005–1.030)
pH: 7 (ref 5.0–8.0)

## 2015-03-23 MED ORDER — BUTALBITAL-APAP-CAFFEINE 50-325-40 MG PO TABS: 1.0000 | ORAL_TABLET | Freq: Four times a day (QID) | ORAL | Status: DC | PRN

## 2015-03-23 MED ORDER — BUTALBITAL-APAP-CAFFEINE 50-325-40 MG PO TABS
1.0000 | ORAL_TABLET | Freq: Once | ORAL | Status: AC
  Administered 2015-03-23: 1 via ORAL
  Filled 2015-03-23: qty 1

## 2015-03-23 NOTE — MAU Note (Signed)
Patient presents at [redacted] weeks gestation with c/o intermittent headaches since beginning of pregnancy, pelvic pain X 2-3 weeks and rib pain X 1 week. Fetus active. Denies bleeding but has noticed a small discharge.

## 2015-03-23 NOTE — Discharge Instructions (Signed)
Abdominal Pain During Pregnancy Abdominal pain is common in pregnancy. Most of the time, it does not cause harm. There are many causes of abdominal pain. Some causes are more serious than others. Some of the causes of abdominal pain in pregnancy are easily diagnosed. Occasionally, the diagnosis takes time to understand. Other times, the cause is not determined. Abdominal pain can be a sign that something is very wrong with the pregnancy, or the pain may have nothing to do with the pregnancy at all. For this reason, always tell your health care provider if you have any abdominal discomfort. HOME CARE INSTRUCTIONS  Monitor your abdominal pain for any changes. The following actions may help to alleviate any discomfort you are experiencing:  Do not have sexual intercourse or put anything in your vagina until your symptoms go away completely.  Get plenty of rest until your pain improves.  Drink clear fluids if you feel nauseous. Avoid solid food as long as you are uncomfortable or nauseous.  Only take over-the-counter or prescription medicine as directed by your health care provider.  Keep all follow-up appointments with your health care provider. SEEK IMMEDIATE MEDICAL CARE IF:  You are bleeding, leaking fluid, or passing tissue from the vagina.  You have increasing pain or cramping.  You have persistent vomiting.  You have painful or bloody urination.  You have a fever.  You notice a decrease in your baby's movements.  You have extreme weakness or feel faint.  You have shortness of breath, with or without abdominal pain.  You develop a severe headache with abdominal pain.  You have abnormal vaginal discharge with abdominal pain.  You have persistent diarrhea.  You have abdominal pain that continues even after rest, or gets worse. MAKE SURE YOU:   Understand these instructions.  Will watch your condition.  Will get help right away if you are not doing well or get worse.     This information is not intended to replace advice given to you by your health care provider. Make sure you discuss any questions you have with your health care provider.   Document Released: 04/18/2005 Document Revised: 02/06/2013 Document Reviewed: 11/15/2012 Elsevier Interactive Patient Education 2016 [employer] Headache Without Cause A headache is pain or discomfort felt around the head or neck area. The specific cause of a headache may not be found. There are many causes and types of headaches. A few common ones are:  Tension headaches.  Migraine headaches.  Cluster headaches.  Chronic daily headaches. HOME CARE INSTRUCTIONS  Watch your condition for any changes. Take these steps to help with your condition: Managing Pain  Take over-the-counter and prescription medicines only as told by your health care provider.  Lie down in a dark, quiet room when you have a headache.  If directed, apply ice to the head and neck area:  Put ice in a plastic bag.  Place a towel between your skin and the bag.  Leave the ice on for 20 minutes, 2-3 times per day.  Use a heating pad or hot shower to apply heat to the head and neck area as told by your health care provider.  Keep lights dim if bright lights bother you or make your headaches worse. Eating and Drinking  Eat meals on a regular schedule.  Limit alcohol use.  Decrease the amount of caffeine you drink, or stop drinking caffeine. General Instructions  Keep all follow-up visits as told by your health care provider. This is important.  Keep a headache journal to help find out what may trigger your headaches. For example, write down:  What you eat and drink.  How much sleep you get.  Any change to your diet or medicines.  Try massage or other relaxation techniques.  Limit stress.  Sit up straight, and do not tense your muscles.  Do not use tobacco products, including cigarettes, chewing tobacco, or  e-cigarettes. If you need help quitting, ask your health care provider.  Exercise regularly as told by your health care provider.  Sleep on a regular schedule. Get 7-9 hours of sleep, or the amount recommended by your health care provider. SEEK MEDICAL CARE IF:   Your symptoms are not helped by medicine.  You have a headache that is different from the usual headache.  You have nausea or you vomit.  You have a fever. SEEK IMMEDIATE MEDICAL CARE IF:   Your headache becomes severe.  You have repeated vomiting.  You have a stiff neck.  You have a loss of vision.  You have problems with speech.  You have pain in the eye or ear.  You have muscular weakness or loss of muscle control.  You lose your balance or have trouble walking.  You feel faint or pass out.  You have confusion.   This information is not intended to replace advice given to you by your health care provider. Make sure you discuss any questions you have with your health care provider.   Document Released: 04/18/2005 Document Revised: 01/07/2015 Document Reviewed: 08/11/2014 Elsevier Interactive Patient Education [employer].

## 2015-03-23 NOTE — MAU Provider Note (Signed)
History     CSN: [id number]  Arrival date and time: 03/23/15 1229   None     Chief Complaint  Patient presents with  . Headache  . Rib Pain   . Pelvic Pain   HPI Nesiah Hathway is 24 y.o. G2P1001 [redacted]w[redacted]d weeks presenting with intermittent headaches that start early in pregnancy.  Hx of migraine.  Takes tylenol almost daily for ha most of the time it helps but today ha continues.  Has taken caffeine headache med that is helpful, ran out.   Someone at work at a "bug" and she had nausea and "forceful" vomiting on Friday.  Tx'd with Pepto bismol and soups.  Now resolved. Also having intermittent lower abdominal and lower back pain since Friday.  Treated for yeast vaginitis 2 weeks ago, resolved.  Neg for vaginal bleeding but spotted a "few drops" over the weekend.  + fetal movement.  She wanted to make sure she isn't dehydrated.    Past Medical History  Diagnosis Date  . Anxiety   . Headache(784.0)   . Eczema   . Asthma     Last used 08/30/14    Past Surgical History  Procedure Laterality Date  . Mouth surgery    . Wisdom tooth extraction    . Tympanostomy tube placement      Family History  Problem Relation Age of Onset  . Diabetes Maternal Grandmother   . Hypertension Maternal Grandmother   . Stroke Maternal Grandmother   . High blood pressure Mother   . Hypertension Mother     Social History  Substance Use Topics  . Smoking status: Never Smoker   . Smokeless tobacco: Never Used  . Alcohol Use: No    Allergies:  Allergies  Allergen Reactions  . Pulmicort [Budesonide] Palpitations    Racing heart  . Tomato Hives and Itching  . Other Hives    mushrooms    Prescriptions prior to admission  Medication Sig Dispense Refill Last Dose  . aspirin-acetaminophen-caffeine (EXCEDRIN MIGRAINE) 250-250-65 MG tablet Take 1 tablet by mouth every 6 (six) hours as needed for headache.   03/22/2015 at Unknown time  . Prenatal Vit-Min-FA-Fish Oil ([facility] PRENATAL GUMMY PO) Take 2  tablets by mouth daily.   03/23/2015 at Unknown time  . albuterol (PROVENTIL HFA;VENTOLIN HFA) 108 (90 BASE) MCG/ACT inhaler Inhale 1-2 puffs into the lungs every 6 (six) hours as needed for wheezing or shortness of breath.   rescue    Review of Systems  Constitutional: Negative for fever and chills.  Gastrointestinal: Positive for nausea (3 days ago), vomiting (3 days ago) and abdominal pain (lower abdominal pain).  Genitourinary: Negative for dysuria, urgency, frequency and hematuria.       + for spotting 4 days ago, none since.  + for white discharge without odor.  + fetal activity  Musculoskeletal: Positive for back pain (lower back pain).  Neurological: Positive for headaches. Negative for dizziness and tingling.   Physical Exam   Blood pressure 120/78, pulse 104, temperature 98.2 F (36.8 C), temperature source Oral, resp. rate 18, height 4\' 11"  (1.499 m), weight 135 lb (61.236 kg), last menstrual period 11/09/2014, unknown if currently breastfeeding.  Physical Exam  Constitutional: She is oriented to person, place, and time. She appears well-developed and well-nourished.  HENT:  Head: Normocephalic.  Neck: Normal range of motion. Neck supple.  Cardiovascular: Normal rate, regular rhythm and normal heart sounds.   Respiratory: Effort normal and breath sounds normal. No respiratory distress.  GI:  Soft. There is tenderness (slightly tender over round ligaments).  Genitourinary: There is no rash, tenderness or lesion on the right labia. There is no rash, tenderness or lesion on the left labia. Uterus is enlarged (1 cm below umbilicus). Uterus is not tender. Cervix exhibits no motion tenderness, no discharge and no friability. No erythema, tenderness or bleeding in the vagina. Vaginal discharge (small amount of white, creamy  discharge without odor) found.  Musculoskeletal: Normal range of motion. She exhibits no edema.  Neurological: She is alert and oriented to person, place, and time.  No cranial nerve deficit. Coordination normal.  Skin: Skin is warm and dry.  Psychiatric: She has a normal mood and affect. Her behavior is normal.    Results for orders placed or performed during the hospital encounter of 03/23/15 (from the past 24 hour(s))  Urinalysis, Routine w reflex microscopic (not at [facility])     Status: None   Collection Time: 03/23/15 12:39 PM  Result Value Ref Range   Color, Urine YELLOW YELLOW   APPearance CLEAR CLEAR   Specific Gravity, Urine 1.020 1.005 - 1.030   pH 7.0 5.0 - 8.0   Glucose, UA NEGATIVE NEGATIVE mg/dL   Hgb urine dipstick NEGATIVE NEGATIVE   Bilirubin Urine NEGATIVE NEGATIVE   Ketones, ur NEGATIVE NEGATIVE mg/dL   Protein, ur NEGATIVE NEGATIVE mg/dL   Nitrite NEGATIVE NEGATIVE   Leukocytes, UA NEGATIVE NEGATIVE  Wet prep, genital     Status: Abnormal   Collection Time: 03/23/15  1:25 PM  Result Value Ref Range   Yeast Wet Prep HPF POC NONE SEEN NONE SEEN   Trich, Wet Prep NONE SEEN NONE SEEN   Clue Cells Wet Prep HPF POC NONE SEEN NONE SEEN   WBC, Wet Prep HPF POC FEW (A) NONE SEEN   Sperm NONE SEEN    MAU Course  Procedures  MDM MSE Exam Labs Med:  Fioricet 1 tab given in MAU Rx for home given  Dr. Ulanda Edison consulted--MSE, labs and exam were reported.  Order given for Rx for Fioricet for home.   Assessment and Plan  A: [redacted]w[redacted]d gestation with headache, lower abdominal pain     Recent GI viral symptoms  P:  Rx for Fioricet for home      Keeps scheduled appointment      Call for worsening sxs. KEY,EVE M 03/23/2015, 2:16 PM

## 2015-05-03 NOTE — L&D Delivery Note (Signed)
Delivery Note At 3:21 PM a viable and healthy female was delivered via Vaginal, Spontaneous Delivery (Presentation: ; Occiput Anterior).  APGAR: 8, 9; weight 5 lb 7.7 oz (2485 g).   Placenta status: Intact, Spontaneous.  Cord: 3 vessels with the following complications: None.    Anesthesia: Epidural  Episiotomy: None Lacerations: 1st degree;Perineal repaired;Periurethral hemostatic Suture Repair: 3.0 vicryl rapide Est. Blood Loss (mL): 250  Mom to postpartum.  Baby to Couplet care / Skin to Skin.  Bovard-Stuckert, Wanda Nixon 07/26/2015, 5:12 PM   Br/O+/RI/no Tdap in PNC/Contra?

## 2015-06-17 ENCOUNTER — Encounter (HOSPITAL_COMMUNITY): Payer: Self-pay | Admitting: *Deleted

## 2015-06-17 ENCOUNTER — Inpatient Hospital Stay (HOSPITAL_COMMUNITY)
Admission: AD | Admit: 2015-06-17 | Discharge: 2015-06-17 | Disposition: A | Discharge status: Home / Self Care | Payer: Medicaid Other | Source: Ambulatory Visit | Attending: Obstetrics and Gynecology | Admitting: Obstetrics and Gynecology

## 2015-06-17 DIAGNOSIS — O26899 Other specified pregnancy related conditions, unspecified trimester: Secondary | ICD-10-CM | POA: Diagnosis not present

## 2015-06-17 DIAGNOSIS — Z888 Allergy status to other drugs, medicaments and biological substances status: Secondary | ICD-10-CM | POA: Insufficient documentation

## 2015-06-17 DIAGNOSIS — R519 Headache, unspecified: Secondary | ICD-10-CM

## 2015-06-17 DIAGNOSIS — Z3A31 31 weeks gestation of pregnancy: Secondary | ICD-10-CM | POA: Diagnosis not present

## 2015-06-17 DIAGNOSIS — J45909 Unspecified asthma, uncomplicated: Secondary | ICD-10-CM | POA: Insufficient documentation

## 2015-06-17 DIAGNOSIS — O98813 Other maternal infectious and parasitic diseases complicating pregnancy, third trimester: Secondary | ICD-10-CM | POA: Diagnosis not present

## 2015-06-17 DIAGNOSIS — R109 Unspecified abdominal pain: Secondary | ICD-10-CM | POA: Diagnosis not present

## 2015-06-17 DIAGNOSIS — B373 Candidiasis of vulva and vagina: Secondary | ICD-10-CM | POA: Diagnosis not present

## 2015-06-17 DIAGNOSIS — O26893 Other specified pregnancy related conditions, third trimester: Secondary | ICD-10-CM | POA: Diagnosis not present

## 2015-06-17 DIAGNOSIS — R51 Headache: Secondary | ICD-10-CM | POA: Insufficient documentation

## 2015-06-17 DIAGNOSIS — N898 Other specified noninflammatory disorders of vagina: Secondary | ICD-10-CM | POA: Insufficient documentation

## 2015-06-17 DIAGNOSIS — F419 Anxiety disorder, unspecified: Secondary | ICD-10-CM | POA: Insufficient documentation

## 2015-06-17 DIAGNOSIS — Z91018 Allergy to other foods: Secondary | ICD-10-CM | POA: Diagnosis not present

## 2015-06-17 DIAGNOSIS — B3731 Acute candidiasis of vulva and vagina: Secondary | ICD-10-CM

## 2015-06-17 DIAGNOSIS — R079 Chest pain, unspecified: Secondary | ICD-10-CM | POA: Insufficient documentation

## 2015-06-17 LAB — WET PREP, GENITAL
Clue Cells Wet Prep HPF POC: NONE SEEN
SPERM: NONE SEEN
TRICH WET PREP: NONE SEEN

## 2015-06-17 LAB — URINALYSIS, ROUTINE W REFLEX MICROSCOPIC
Bilirubin Urine: NEGATIVE
Glucose, UA: NEGATIVE mg/dL
Hgb urine dipstick: NEGATIVE
KETONES UR: NEGATIVE mg/dL
NITRITE: NEGATIVE
PROTEIN: NEGATIVE mg/dL
Specific Gravity, Urine: 1.02 (ref 1.005–1.030)
pH: 7.5 (ref 5.0–8.0)

## 2015-06-17 LAB — URINE MICROSCOPIC-ADD ON

## 2015-06-17 MED ORDER — BUTALBITAL-APAP-CAFFEINE 50-325-40 MG PO TABS
1.0000 | ORAL_TABLET | ORAL | Status: AC
  Administered 2015-06-17: 1 via ORAL
  Filled 2015-06-17: qty 1

## 2015-06-17 MED ORDER — TERCONAZOLE 0.4 % VA CREA: 1.0000 | TOPICAL_CREAM | Freq: Every day | VAGINAL | Status: DC

## 2015-06-17 NOTE — Discharge Instructions (Signed)

## 2015-06-17 NOTE — MAU Provider Note (Signed)
History     CSN: [id number]  Arrival date and time: 06/17/15 [id number]   First Provider Initiated Contact with Patient 06/17/15 0901      Chief Complaint  Patient presents with  . Abdominal Pain  . Vaginal Discharge  . Chest Pain  . Headache   HPI Wanda Nixon 25 y.o. G2P1001 @[redacted]w[redacted]d  presents to MAU complaining of abdominal pain, difficulty breathing, LOF, chest pain.  She notes her baby is in a position that is causing abdominal pain on both sides.  She is having tightness in her belly.  She is not timing them today but notes they were 5 minutes apart 5 days ago.  She was evaluated at office at that time and was 1cm.  She is having a left sided headache with throbbing.  She has a h/o migraine and notes they worsen with pregnancy.  She has taken Tylenol and notes this helps but the HA returns.  She used fioricet previously this pregnancy but has run out.  Although she notes she has inhaler, she has not used it today.   Her LOF has been ongoing a couple weeks.  She has seen Dr. Melba Coon for this.  It is described as goo and an occasional trickle that is clear and feels like she has to pee.  Today it is described as clear and sticky.   Chest pain is on the right side and feels tight when she takes a deep breath.   OB History    Gravida Para Term Preterm AB TAB SAB Ectopic Multiple Living   2 1 1       0 1      Past Medical History  Diagnosis Date  . Anxiety   . Headache(784.0)   . Eczema   . Asthma     Last used 08/30/14    Past Surgical History  Procedure Laterality Date  . Mouth surgery    . Wisdom tooth extraction    . Tympanostomy tube placement      Family History  Problem Relation Age of Onset  . Diabetes Maternal Grandmother   . Hypertension Maternal Grandmother   . Stroke Maternal Grandmother   . High blood pressure Mother   . Hypertension Mother     Social History  Substance Use Topics  . Smoking status: Never Smoker   . Smokeless tobacco: Never Used  . Alcohol  Use: No    Allergies:  Allergies  Allergen Reactions  . Pulmicort [Budesonide] Palpitations    Racing heart  . Tomato Hives and Itching  . Other Hives    mushrooms    Prescriptions prior to admission  Medication Sig Dispense Refill Last Dose  . albuterol (PROVENTIL HFA;VENTOLIN HFA) 108 (90 BASE) MCG/ACT inhaler Inhale 1-2 puffs into the lungs every 6 (six) hours as needed for wheezing or shortness of breath.   rescue  . butalbital-acetaminophen-caffeine (FIORICET) 50-325-40 MG tablet Take 1 tablet by mouth every 6 (six) hours as needed for headache. 20 tablet 0   . Prenatal Vit-Min-FA-Fish Oil ([facility] PRENATAL GUMMY PO) Take 2 tablets by mouth daily.   03/23/2015 at Unknown time    ROS Pertinent ROS in HPI.  All other systems are negative.   Physical Exam   Blood pressure 114/67, pulse 104, temperature 97.8 F (36.6 C), temperature source Oral, resp. rate 18, weight 135 lb 6.4 oz (61.417 kg), last menstrual period 11/09/2014, SpO2 100 %, unknown if currently breastfeeding.  Physical Exam  Constitutional: She is oriented to person, place, and  time. She appears well-developed and well-nourished. No distress.  HENT:  Head: Normocephalic and atraumatic.  Eyes: Conjunctivae and EOM are normal.  Neck: Normal range of motion. Neck supple.  Cardiovascular: Normal rate.   Questionable third heart sound  Respiratory: Effort normal and breath sounds normal. No respiratory distress.  GI: Soft. Bowel sounds are normal. She exhibits no distension. There is no tenderness.  Genitourinary:  Large amt of greenish thick clumpy discharge No evidence of blood or LOF.   Cervix closed  Musculoskeletal: Normal range of motion.  Neurological: She is alert and oriented to person, place, and time.  Skin: Skin is warm and dry.  Psychiatric: She has a normal mood and affect. Her behavior is normal.    MAU Course  Procedures  MDM O2 sats 100%.  No wheezes or problem on lung exam EKG ordered for  chest pain: Cardmaster called to review EKG.  He advises no cardiac abnormality on EKG Fetal tracing reactive Yeast on wet prep Fern negative Fioricet for headache.  Pt reports resolution Dr. Willis Modena consulted.  He is agreeable to discharge home with Terazole and f/u in clinic.    Assessment and Plan  A:  1. Yeast infection of the vagina   2. Chest pain, unspecified chest pain type   3. Vaginal discharge during pregnancy, third trimester   4. Abdominal pain affecting pregnancy   5.     Headache in pregnancy  P: Discharge home Terazole PTL precautions Albuterol as needed Patient may return to MAU as needed or if her condition were to change or worsen   Paticia Stack 06/17/2015, 9:02 AM

## 2015-06-17 NOTE — MAU Note (Signed)
Been having this pain on the left side of her head for a couple wks,  Didn't mention it to her dr because she thought it was just a headache. Been having chest pain this morning, tightens up then eases of, make it hard to breath. Pt has asthma  Feeling a lot of pressure in abd, feels like there is no room, when the baby moves, he is in her ribs and can hardly move or breath.  Has had a few contractions this morning.

## 2015-06-18 LAB — CULTURE, OB URINE

## 2015-06-18 NOTE — Progress Notes (Signed)
FHT from yesterday reviewed.  Reactive NST, possibly some irregular ctx.

## 2015-07-22 ENCOUNTER — Inpatient Hospital Stay (HOSPITAL_COMMUNITY)
Admission: AD | Admit: 2015-07-22 | Discharge: 2015-07-22 | Disposition: A | Discharge status: Home / Self Care | Payer: Medicaid Other | Source: Ambulatory Visit | Attending: Obstetrics and Gynecology | Admitting: Obstetrics and Gynecology

## 2015-07-22 ENCOUNTER — Encounter (HOSPITAL_COMMUNITY): Payer: Self-pay

## 2015-07-22 DIAGNOSIS — J4521 Mild intermittent asthma with (acute) exacerbation: Secondary | ICD-10-CM | POA: Diagnosis not present

## 2015-07-22 DIAGNOSIS — N898 Other specified noninflammatory disorders of vagina: Secondary | ICD-10-CM | POA: Diagnosis not present

## 2015-07-22 DIAGNOSIS — O99513 Diseases of the respiratory system complicating pregnancy, third trimester: Secondary | ICD-10-CM | POA: Diagnosis not present

## 2015-07-22 DIAGNOSIS — R5383 Other fatigue: Secondary | ICD-10-CM | POA: Diagnosis present

## 2015-07-22 DIAGNOSIS — J45909 Unspecified asthma, uncomplicated: Secondary | ICD-10-CM | POA: Diagnosis not present

## 2015-07-22 DIAGNOSIS — O26893 Other specified pregnancy related conditions, third trimester: Secondary | ICD-10-CM

## 2015-07-22 DIAGNOSIS — Z3A36 36 weeks gestation of pregnancy: Secondary | ICD-10-CM | POA: Diagnosis not present

## 2015-07-22 DIAGNOSIS — R21 Rash and other nonspecific skin eruption: Secondary | ICD-10-CM | POA: Diagnosis not present

## 2015-07-22 DIAGNOSIS — L309 Dermatitis, unspecified: Secondary | ICD-10-CM

## 2015-07-22 DIAGNOSIS — O99713 Diseases of the skin and subcutaneous tissue complicating pregnancy, third trimester: Secondary | ICD-10-CM

## 2015-07-22 DIAGNOSIS — J45901 Unspecified asthma with (acute) exacerbation: Secondary | ICD-10-CM | POA: Insufficient documentation

## 2015-07-22 LAB — POCT FERN TEST: POCT FERN TEST: NEGATIVE

## 2015-07-22 MED ORDER — CETIRIZINE-PSEUDOEPHEDRINE ER 5-120 MG PO TB12: 1.0000 | ORAL_TABLET | Freq: Every day | ORAL | Status: DC

## 2015-07-22 MED ORDER — PREDNISONE 10 MG PO TABS: 40.0000 mg | ORAL_TABLET | Freq: Every day | ORAL | Status: DC

## 2015-07-22 NOTE — MAU Note (Signed)
States her asthma has been acting up and she's afraid the baby isn't getting enough air.  Feels like she's been leaking fluid since yesterday after her appointment.   Used her inhaler last night and today, more than usual.

## 2015-07-22 NOTE — MAU Note (Signed)
Urine in lab 

## 2015-07-22 NOTE — MAU Provider Note (Signed)
Chief Complaint:  Asthma and Fatigue   First Provider Initiated Contact with Patient 07/22/15 1308     HPI   Wanda Nixon is a 25 y.o. G2P1001 at [redacted]w[redacted]d who presents to maternity admissions reporting trouble breathing and possible leaking of fluid.  Also c/o pruritic rash on abdomen and chest as well as aches and pains in pelvis and back. She reports good fetal movement, denies vaginal bleeding, vaginal itching/burning,urinary symptoms, h/a, dizziness, n/v, diarrhea, constipation or fever/chills.  She denies headache, visual changes or abdominal pain.  Has preexisting asthma, has used inhaler more today and for the past few weeks. Does not use antihistamines.  States wheezing stopped with inhaler but still feels tight and is worried baby is not getting "enough air".    Has had rash x 1 week, states they gave her "one steroid pill" last week.  States has been leaking for a month.  Has been told it was normal.  States it leaves a wet spot on sheets.   RN Note: States her asthma has been acting up and she's afraid the baby isn't getting enough air. Feels like she's been leaking fluid since yesterday after her appointment. Used her inhaler last night and today, more than usual.          Past Medical History: Past Medical History  Diagnosis Date  . Anxiety   . Headache(784.0)   . Eczema   . Asthma     Last used 08/30/14    Past obstetric history: OB History  Gravida Para Term Preterm AB SAB TAB Ectopic Multiple Living  2 1 1       0 1    # Outcome Date GA Lbr Len/2nd Weight Sex Delivery Anes PTL Lv  2 Current           1 Term 08/31/14 [redacted]w[redacted]d 14:24 / 00:12 5 lb 12.4 oz (2.62 kg) F Vag-Spont EPI  Y      Past Surgical History: Past Surgical History  Procedure Laterality Date  . Mouth surgery    . Wisdom tooth extraction    . Tympanostomy tube placement      Family History: Family History  Problem Relation Age of Onset  . Diabetes Maternal Grandmother   . Hypertension  Maternal Grandmother   . Stroke Maternal Grandmother   . High blood pressure Mother   . Hypertension Mother     Social History: Social History  Substance Use Topics  . Smoking status: Never Smoker   . Smokeless tobacco: Never Used  . Alcohol Use: No    Allergies:  Allergies  Allergen Reactions  . Pulmicort [Budesonide] Palpitations    Racing heart  . Tomato Hives and Itching  . Other Hives    mushrooms    Meds:  Prescriptions prior to admission  Medication Sig Dispense Refill Last Dose  . albuterol (PROVENTIL HFA;VENTOLIN HFA) 108 (90 BASE) MCG/ACT inhaler Inhale 1-2 puffs into the lungs every 6 (six) hours as needed for wheezing or shortness of breath.   06/03/2015  . butalbital-acetaminophen-caffeine (FIORICET) 50-325-40 MG tablet Take 1 tablet by mouth every 6 (six) hours as needed for headache. (Patient not taking: Reported on 06/17/2015) 20 tablet 0   . Prenatal Vit-Min-FA-Fish Oil ([facility] PRENATAL GUMMY PO) Take 2 tablets by mouth daily.   06/16/2015 at Unknown time  . terconazole (TERAZOL 7) 0.4 % vaginal cream Place 1 applicator vaginally at bedtime. Apply to external surface as well. 45 g 0     I have reviewed patient's  Past Medical Hx, Surgical Hx, Family Hx, Social Hx, medications and allergies.   ROS:  Review of Systems  Constitutional: Negative for fever, chills and fatigue.  Respiratory: Positive for chest tightness and shortness of breath. Negative for cough, choking, wheezing and stridor.   Gastrointestinal: Negative for abdominal pain.  Genitourinary: Positive for vaginal discharge. Negative for dysuria, vaginal bleeding and pelvic pain.  Musculoskeletal: Positive for back pain. Negative for arthralgias.  Neurological: Negative for dizziness.   Other systems negative  Physical Exam  Patient Vitals for the past 24 hrs:  BP Temp Temp src Pulse Resp SpO2 Height Weight  07/22/15 1246 112/83 mmHg 98.2 F (36.8 C) Oral 113 20 100 % 4\' 11"  (1.499 m) 136 lb (61.689  kg)   Constitutional: Well-developed, well-nourished female in no acute distress.  Cardiovascular: normal rate and rhythm Respiratory: normal effort, clear to auscultation bilaterally   Some wheezing sounds when coughing, but no insp or exp wheezes. O2 sat 100% Skin:   Diffuse maculopapular rash on lower chest and abdomen, pruritic GI: Abd soft, non-tender, gravid appropriate for gestational age.   No rebound or guarding. MS: Extremities nontender, no edema, normal ROM Neurologic: Alert and oriented x 4.  GU: Neg CVAT.  PELVIC EXAM: Cervix pink, visually closed, without lesion, scant white creamy discharge, vaginal walls and external genitalia normal Bimanual exam: Cervix firm, posterior, neg CMT, uterus nontender, Fundal Height consistent with dates, adnexa without tenderness, enlargement, or mass  Negative pooling, Negative ferning Dilation: 1.5 Effacement (%): 30 Station: -3 Exam by:: Hansel Feinstein CNM     FHT:  Baseline 140 , moderate variability, accelerations present, no decelerations Contractions: Irregular    Labs: No results found for this or any previous visit (from the past 24 hour(s)). --/--/O POS, O POS (05/01 [id number])  Imaging:  No results found.  MAU Course/MDM: I have ordered labs and reviewed results.  NST reviewed Consult Dr Willis Modena with presentation, exam findings and test results.  Treatments in MAU included none   Assessment: SIUP at [redacted]w[redacted]d  Asthma exacerbation PUPPS rash Vaginal discharge with no evidence for ROM Discomforts of late pregnancy   Plan: Discharge home Rx steroid burst (40mg  qd x 5d) Rx Zyrtec 1 PO qd for control of allergic response  Labor precautions and fetal kick counts Follow up in Office for prenatal visits and recheck  Patient has appt tomorrow. Instructed to ask them about steroid cream for PUPPs    Medication List    ASK your doctor about these medications        albuterol 108 (90 Base) MCG/ACT inhaler  Commonly  known as:  PROVENTIL HFA;VENTOLIN HFA  Inhale 1-2 puffs into the lungs every 6 (six) hours as needed for wheezing or shortness of breath.     butalbital-acetaminophen-caffeine 50-325-40 MG tablet  Commonly known as:  FIORICET  Take 1 tablet by mouth every 6 (six) hours as needed for headache.     [facility] PRENATAL GUMMY PO  Take 2 tablets by mouth daily.     terconazole 0.4 % vaginal cream  Commonly known as:  TERAZOL 7  Place 1 applicator vaginally at bedtime. Apply to external surface as well.       Pt stable at time of discharge.  Encouraged to return here or to other Urgent Care/ED if she develops worsening of symptoms, increase in pain, fever, or other concerning symptoms.      Hansel Feinstein CNM, MSN Certified Nurse-Midwife 07/22/2015 1:08 PM

## 2015-07-22 NOTE — Discharge Instructions (Signed)
Asthma, Adult Asthma is a recurring condition in which the airways tighten and narrow. Asthma can make it difficult to breathe. It can cause coughing, wheezing, and shortness of breath. Asthma episodes, also called asthma attacks, range from minor to life-threatening. Asthma cannot be cured, but medicines and lifestyle changes can help control it. CAUSES Asthma is believed to be caused by inherited (genetic) and environmental factors, but its exact cause is unknown. Asthma may be triggered by allergens, lung infections, or irritants in the air. Asthma triggers are different for each person. Common triggers include:   Animal dander.  Dust mites.  Cockroaches.  Pollen from trees or grass.  Mold.  Smoke.  Air pollutants such as dust, household cleaners, hair sprays, aerosol sprays, paint fumes, strong chemicals, or strong odors.  Cold air, weather changes, and winds (which increase molds and pollens in the air).  Strong emotional expressions such as crying or laughing hard.  Stress.  Certain medicines (such as aspirin) or types of drugs (such as beta-blockers).  Sulfites in foods and drinks. Foods and drinks that may contain sulfites include dried fruit, potato chips, and sparkling grape juice.  Infections or inflammatory conditions such as the flu, a cold, or an inflammation of the nasal membranes (rhinitis).  Gastroesophageal reflux disease (GERD).  Exercise or strenuous activity. SYMPTOMS Symptoms may occur immediately after asthma is triggered or many hours later. Symptoms include:  Wheezing.  Excessive nighttime or early morning coughing.  Frequent or severe coughing with a common cold.  Chest tightness.  Shortness of breath. DIAGNOSIS  The diagnosis of asthma is made by a review of your medical history and a physical exam. Tests may also be performed. These may include:  Lung function studies. These tests show how much air you breathe in and out.  Allergy  tests.  Imaging tests such as X-rays. TREATMENT  Asthma cannot be cured, but it can usually be controlled. Treatment involves identifying and avoiding your asthma triggers. It also involves medicines. There are 2 classes of medicine used for asthma treatment:   Controller medicines. These prevent asthma symptoms from occurring. They are usually taken every day.  Reliever or rescue medicines. These quickly relieve asthma symptoms. They are used as needed and provide short-term relief. Your health care provider will help you create an asthma action plan. An asthma action plan is a written plan for managing and treating your asthma attacks. It includes a list of your asthma triggers and how they may be avoided. It also includes information on when medicines should be taken and when their dosage should be changed. An action plan may also involve the use of a device called a peak flow meter. A peak flow meter measures how well the lungs are working. It helps you monitor your condition. HOME CARE INSTRUCTIONS   Take medicines only as directed by your health care provider. Speak with your health care provider if you have questions about how or when to take the medicines.  Use a peak flow meter as directed by your health care provider. Record and keep track of readings.  Understand and use the action plan to help minimize or stop an asthma attack without needing to seek medical care.  Control your home environment in the following ways to help prevent asthma attacks:  Do not smoke. Avoid being exposed to secondhand smoke.  Change your heating and air conditioning filter regularly.  Limit your use of fireplaces and wood stoves.  Get rid of pests (such as roaches  and mice) and their droppings.  Throw away plants if you see mold on them.  Clean your floors and dust regularly. Use unscented cleaning products.  Try to have someone else vacuum for you regularly. Stay out of rooms while they are  being vacuumed and for a short while afterward. If you vacuum, use a dust mask from a hardware store, a double-layered or microfilter vacuum cleaner bag, or a vacuum cleaner with a HEPA filter.  Replace carpet with wood, tile, or vinyl flooring. Carpet can trap dander and dust.  Use allergy-proof pillows, mattress covers, and box spring covers.  Wash bed sheets and blankets every week in hot water and dry them in a dryer.  Use blankets that are made of polyester or cotton.  Clean bathrooms and kitchens with bleach. If possible, have someone repaint the walls in these rooms with mold-resistant paint. Keep out of the rooms that are being cleaned and painted.  Wash hands frequently. SEEK MEDICAL CARE IF:   You have wheezing, shortness of breath, or a cough even if taking medicine to prevent attacks.  The colored mucus you cough up (sputum) is thicker than usual.  Your sputum changes from clear or white to yellow, green, gray, or bloody.  You have any problems that may be related to the medicines you are taking (such as a rash, itching, swelling, or trouble breathing).  You are using a reliever medicine more than 2-3 times per week.  Your peak flow is still at 50-79% of your personal best after following your action plan for 1 hour.  You have a fever. SEEK IMMEDIATE MEDICAL CARE IF:   You seem to be getting worse and are unresponsive to treatment during an asthma attack.  You are short of breath even at rest.  You get short of breath when doing very little physical activity.  You have difficulty eating, drinking, or talking due to asthma symptoms.  You develop chest pain.  You develop a fast heartbeat.  You have a bluish color to your lips or fingernails.  You are light-headed, dizzy, or faint.  Your peak flow is less than 50% of your personal best.   This information is not intended to replace advice given to you by your health care provider. Make sure you discuss any  questions you have with your health care provider.   Document Released: 04/18/2005 Document Revised: 01/07/2015 Document Reviewed: 11/15/2012 Elsevier Interactive Patient Education 2016 [employer]. Vaginal Delivery During delivery, your health care provider will help you give birth to your baby. During a vaginal delivery, you will work to push the baby out of your vagina. However, before you can push your baby out, a few things need to happen. The opening of your uterus (cervix) has to soften, thin out, and open up (dilate) all the way to 10 cm. Also, your baby has to move down from the uterus into your vagina.  SIGNS OF LABOR  Your health care provider will first need to make sure you are in labor. Signs of labor include:   Passing what is called the mucous plug before labor begins. This is a small amount of blood-stained mucus.  Having regular, painful uterine contractions.   The time between contractions gets shorter.   The discomfort and pain gradually get more intense.  Contraction pains get worse when walking and do not go away when resting.   Your cervix becomes thinner (effacement) and dilates. BEFORE THE DELIVERY Once you are in labor and admitted  into the hospital or care center, your health care provider may do the following:   Perform a complete physical exam.  Review any complications related to pregnancy or labor.  Check your blood pressure, pulse, temperature, and heart rate (vital signs).   Determine if, and when, the rupture of amniotic membranes occurred.  Do a vaginal exam (using a sterile glove and lubricant) to determine:   The position (presentation) of the baby. Is the baby's head presenting first (vertex) in the birth canal (vagina), or are the feet or buttocks first (breech)?   The level (station) of the baby's head within the birth canal.   The effacement and dilatation of the cervix.   An electronic fetal monitor is usually placed on your  abdomen when you first arrive. This is used to monitor your contractions and the baby's heart rate.  When the monitor is on your abdomen (external fetal monitor), it can only pick up the frequency and length of your contractions. It cannot tell the strength of your contractions.  If it becomes necessary for your health care provider to know exactly how strong your contractions are or to see exactly what the baby's heart rate is doing, an internal monitor may be inserted into your vagina and uterus. Your health care provider will discuss the benefits and risks of using an internal monitor and obtain your permission before inserting the device.  Continuous fetal monitoring may be needed if you have an epidural, are receiving certain medicines (such as oxytocin), or have pregnancy or labor complications.  An IV access tube may be placed into a vein in your arm to deliver fluids and medicines if necessary. THREE STAGES OF LABOR AND DELIVERY Normal labor and delivery is divided into three stages. First Stage This stage starts when you begin to contract regularly and your cervix begins to efface and dilate. It ends when your cervix is completely open (fully dilated). The first stage is the longest stage of labor and can last from 3 hours to 15 hours.  Several methods are available to help with labor pain. You and your health care provider will decide which option is best for you. Options include:   Opioid medicines. These are strong pain medicines that you can get through your IV tube or as a shot into your muscle. These medicines lessen pain but do not make it go away completely.  Epidural. A medicine is given through a thin tube that is inserted in your back. The medicine numbs the lower part of your body and prevents any pain in that area.  Paracervical pain medicine. This is an injection of an anesthetic on each side of your cervix.   You may request natural childbirth, which does not involve  the use of pain medicines or an epidural during labor and delivery. Instead, you will use other things, such as breathing exercises, to help cope with the pain. Second Stage The second stage of labor begins when your cervix is fully dilated at 10 cm. It continues until you push your baby down through the birth canal and the baby is born. This stage can take only minutes or several hours.  The location of your baby's head as it moves through the birth canal is reported as a number called a station. If the baby's head has not started its descent, the station is described as being at minus 3 (-3). When your baby's head is at the zero station, it is at the middle of the birth  canal and is engaged in the pelvis. The station of your baby helps indicate the progress of the second stage of labor.  When your baby is born, your health care provider may hold the baby with his or her head lowered to prevent amniotic fluid, mucus, and blood from getting into the baby's lungs. The baby's mouth and nose may be suctioned with a small bulb syringe to remove any additional fluid.  Your health care provider may then place the baby on your stomach. It is important to keep the baby from getting cold. To do this, the health care provider will dry the baby off, place the baby directly on your skin (with no blankets between you and the baby), and cover the baby with warm, dry blankets.   The umbilical cord is cut. Third Stage During the third stage of labor, your health care provider will deliver the placenta (afterbirth) and make sure your bleeding is under control. The delivery of the placenta usually takes about 5 minutes but can take up to 30 minutes. After the placenta is delivered, a medicine may be given either by IV or injection to help contract the uterus and control bleeding. If you are planning to breastfeed, you can try to do so now. After you deliver the placenta, your uterus should contract and get very firm. If  your uterus does not remain firm, your health care provider will massage it. This is important because the contraction of the uterus helps cut off bleeding at the site where the placenta was attached to your uterus. If your uterus does not contract properly and stay firm, you may continue to bleed heavily. If there is a lot of bleeding, medicines may be given to contract the uterus and stop the bleeding.    This information is not intended to replace advice given to you by your health care provider. Make sure you discuss any questions you have with your health care provider.   Document Released: 01/26/2008 Document Revised: 05/09/2014 Document Reviewed: 12/14/2011 Elsevier Interactive Patient Education [employer].

## 2015-07-24 LAB — OB RESULTS CONSOLE GBS: STREP GROUP B AG: POSITIVE

## 2015-07-25 ENCOUNTER — Encounter (HOSPITAL_COMMUNITY): Payer: Self-pay

## 2015-07-25 DIAGNOSIS — O26613 Liver and biliary tract disorders in pregnancy, third trimester: Secondary | ICD-10-CM

## 2015-07-25 DIAGNOSIS — K831 Obstruction of bile duct: Secondary | ICD-10-CM

## 2015-07-25 HISTORY — DX: Obstruction of bile duct: K83.1

## 2015-07-25 HISTORY — DX: Liver and biliary tract disorders in pregnancy, third trimester: O26.613

## 2015-07-25 NOTE — H&P (Signed)
Wanda Nixon is a 25 y.o. female G2P1001 at 29 weeks with cholestasis of pregnancy (Bile Acids = 10.9) for IOL.  D/W MFM agree with IOL at 37 weeks.  +FM, no LOF, no VB, occ ctx.  Increased itching.  D/W pt risks of cholestasis and process of IOL.  +FM, no LOF, no VB, occ ctx. Pregnancy complicated by + GBBS, declined Tdap   Maternal Medical History:  Contractions: Frequency: irregular.   Perceived severity is moderate.    Fetal activity: Perceived fetal activity is normal.    Prenatal Complications - Diabetes: none.    OB History    Gravida Para Term Preterm AB TAB SAB Ectopic Multiple Living   2 1 1       0 1    G1 08/31/14 - 38wk 5#12 G2 present Last pap 09/17/13 No STDs  Past Medical History  Diagnosis Date  . Anxiety   . Headache(784.0)   . Eczema   . Asthma     Last used 08/30/14  . Cholestasis of pregnancy in third trimester 07/25/2015   Past Surgical History  Procedure Laterality Date  . Mouth surgery    . Wisdom tooth extraction    . Tympanostomy tube placement     Family History: family history includes Diabetes in her maternal grandmother; High blood pressure in her mother; Hypertension in her maternal grandmother and mother; Stroke in her maternal grandmother. Social History:  reports that she has never smoked. She has never used smokeless tobacco. She reports that she does not drink alcohol or use illicit drugs.married Meds PNV All NKDA   Prenatal Transfer Tool  Maternal Diabetes: No Genetic Screening: Normal Maternal Ultrasounds/Referrals: Normal Fetal Ultrasounds or other Referrals:  None Maternal Substance Abuse:  No Significant Maternal Medications:  None Significant Maternal Lab Results:  Lab values include: Group B Strep positive Other Comments:  CHOLESTASIS of pregnancy bile acids = 10.9  Review of Systems  Constitutional: Negative.   HENT: Negative.   Eyes: Negative.   Respiratory: Negative.   Cardiovascular: Negative.   Gastrointestinal:  Negative.   Genitourinary: Negative.   Musculoskeletal: Negative.   Skin: Negative.   Neurological: Negative.   Psychiatric/Behavioral: Negative.       Last menstrual period 11/09/2014, unknown if currently breastfeeding. Maternal Exam:  Uterine Assessment: Contraction frequency is irregular.   Abdomen: Patient reports no abdominal tenderness. Fundal height is app for gestation.   Estimated fetal weight is 6-6.5#.   Fetal presentation: vertex  Introitus: Normal vulva. Normal vagina.  Cervix: Cervix evaluated by digital exam.     Physical Exam  Constitutional: She is oriented to person, place, and time. She appears well-developed and well-nourished.  HENT:  Head: Normocephalic and atraumatic.  Cardiovascular: Normal rate and regular rhythm.   Respiratory: Effort normal and breath sounds normal. No respiratory distress. She has no wheezes.  GI: Soft. Bowel sounds are normal. She exhibits no distension. There is no tenderness.  PRURitic rash  Musculoskeletal: Normal range of motion.  Neurological: She is alert and oriented to person, place, and time.  Skin: Skin is warm and dry.  Psychiatric: She has a normal mood and affect. Her behavior is normal.    Prenatal labs: ABO, Rh: --/--/O POS, O POS (05/01 [id number]) Antibody: NEG (05/01 [id number]) Rubella:  immune RPR: Non Reactive (05/01 0530)  HBsAg:   neg HIV:   neg GBS: Positive  Hgb 13.1/Plt 335/Varicella immune/Ur cux neg/GC neg/Chl neg/glucola 33  Dated by LMP cw First tri [place] nl anat,  ant plac, female SVE 3cm  Assessment/Plan: 24yo G2P1001 at 37 week with cholestasis for IOL PCN for GBBS prophylaxis AROM and pitocin to augment  Expect SVD  Bovard-Stuckert, Latesha Chesney 07/25/2015, 5:49 PM

## 2015-07-26 ENCOUNTER — Inpatient Hospital Stay (HOSPITAL_COMMUNITY): Payer: Medicaid Other | Admitting: Anesthesiology

## 2015-07-26 ENCOUNTER — Inpatient Hospital Stay (HOSPITAL_COMMUNITY)
Admission: RE | Admit: 2015-07-26 | Discharge: 2015-07-28 | DRG: 775 | Disposition: A | Discharge status: Home / Self Care | Payer: Medicaid Other | Source: Ambulatory Visit | Attending: Obstetrics and Gynecology | Admitting: Obstetrics and Gynecology

## 2015-07-26 ENCOUNTER — Encounter (HOSPITAL_COMMUNITY): Payer: Self-pay

## 2015-07-26 VITALS — BP 113/77 | HR 90 | Temp 98.1°F | Resp 18 | Ht 59.0 in | Wt 136.0 lb

## 2015-07-26 DIAGNOSIS — O26613 Liver and biliary tract disorders in pregnancy, third trimester: Secondary | ICD-10-CM

## 2015-07-26 DIAGNOSIS — O99824 Streptococcus B carrier state complicating childbirth: Secondary | ICD-10-CM | POA: Diagnosis present

## 2015-07-26 DIAGNOSIS — J45909 Unspecified asthma, uncomplicated: Secondary | ICD-10-CM | POA: Diagnosis present

## 2015-07-26 DIAGNOSIS — Z23 Encounter for immunization: Secondary | ICD-10-CM | POA: Diagnosis not present

## 2015-07-26 DIAGNOSIS — Z823 Family history of stroke: Secondary | ICD-10-CM

## 2015-07-26 DIAGNOSIS — O9952 Diseases of the respiratory system complicating childbirth: Secondary | ICD-10-CM | POA: Diagnosis present

## 2015-07-26 DIAGNOSIS — Z3A37 37 weeks gestation of pregnancy: Secondary | ICD-10-CM

## 2015-07-26 DIAGNOSIS — O2662 Liver and biliary tract disorders in childbirth: Principal | ICD-10-CM | POA: Diagnosis present

## 2015-07-26 DIAGNOSIS — K831 Obstruction of bile duct: Secondary | ICD-10-CM | POA: Diagnosis present

## 2015-07-26 DIAGNOSIS — Z833 Family history of diabetes mellitus: Secondary | ICD-10-CM

## 2015-07-26 DIAGNOSIS — Z8249 Family history of ischemic heart disease and other diseases of the circulatory system: Secondary | ICD-10-CM

## 2015-07-26 HISTORY — DX: Liver and biliary tract disorders in pregnancy, third trimester: O26.613

## 2015-07-26 HISTORY — DX: Obstruction of bile duct: K83.1

## 2015-07-26 LAB — TYPE AND SCREEN
ABO/RH(D): O POS
ANTIBODY SCREEN: NEGATIVE

## 2015-07-26 LAB — CBC
HCT: 26.6 % — ABNORMAL LOW (ref 36.0–46.0)
HEMOGLOBIN: 8.6 g/dL — AB (ref 12.0–15.0)
MCH: 24.8 pg — AB (ref 26.0–34.0)
MCHC: 32.3 g/dL (ref 30.0–36.0)
MCV: 76.7 fL — ABNORMAL LOW (ref 78.0–100.0)
PLATELETS: 288 10*3/uL (ref 150–400)
RBC: 3.47 MIL/uL — AB (ref 3.87–5.11)
RDW: 15.2 % (ref 11.5–15.5)
WBC: 7.7 10*3/uL (ref 4.0–10.5)

## 2015-07-26 MED ORDER — FENTANYL 2.5 MCG/ML BUPIVACAINE 1/10 % EPIDURAL INFUSION (WH - ANES)
14.0000 mL/h | INTRAMUSCULAR | Status: DC | PRN
  Administered 2015-07-26: 14 mL/h via EPIDURAL
  Filled 2015-07-26: qty 125

## 2015-07-26 MED ORDER — LACTATED RINGERS IV SOLN
INTRAVENOUS | Status: DC
  Administered 2015-07-26 (×2): via INTRAVENOUS

## 2015-07-26 MED ORDER — ONDANSETRON HCL 4 MG/2ML IJ SOLN: 4.0000 mg | Freq: Four times a day (QID) | INTRAMUSCULAR | Status: DC | PRN

## 2015-07-26 MED ORDER — IBUPROFEN 600 MG PO TABS
600.0000 mg | ORAL_TABLET | Freq: Four times a day (QID) | ORAL | Status: DC
  Administered 2015-07-26 – 2015-07-28 (×7): 600 mg via ORAL
  Filled 2015-07-26 (×8): qty 1

## 2015-07-26 MED ORDER — ALBUTEROL SULFATE (2.5 MG/3ML) 0.083% IN NEBU: 3.0000 mL | INHALATION_SOLUTION | Freq: Four times a day (QID) | RESPIRATORY_TRACT | Status: DC | PRN

## 2015-07-26 MED ORDER — OXYTOCIN 10 UNIT/ML IJ SOLN
1.0000 m[IU]/min | INTRAVENOUS | Status: DC
  Administered 2015-07-26: 2 m[IU]/min via INTRAVENOUS
  Filled 2015-07-26: qty 10

## 2015-07-26 MED ORDER — WITCH HAZEL-GLYCERIN EX PADS: 1.0000 "application " | MEDICATED_PAD | CUTANEOUS | Status: DC | PRN

## 2015-07-26 MED ORDER — SIMETHICONE 80 MG PO CHEW: 80.0000 mg | CHEWABLE_TABLET | ORAL | Status: DC | PRN

## 2015-07-26 MED ORDER — BUTORPHANOL TARTRATE 1 MG/ML IJ SOLN: 1.0000 mg | INTRAMUSCULAR | Status: DC | PRN

## 2015-07-26 MED ORDER — OXYCODONE-ACETAMINOPHEN 5-325 MG PO TABS: 2.0000 | ORAL_TABLET | ORAL | Status: DC | PRN

## 2015-07-26 MED ORDER — LACTATED RINGERS IV SOLN: 500.0000 mL | INTRAVENOUS | Status: DC | PRN

## 2015-07-26 MED ORDER — ACETAMINOPHEN 325 MG PO TABS: 650.0000 mg | ORAL_TABLET | ORAL | Status: DC | PRN

## 2015-07-26 MED ORDER — BENZOCAINE-MENTHOL 20-0.5 % EX AERO
1.0000 "application " | INHALATION_SPRAY | CUTANEOUS | Status: DC | PRN
  Administered 2015-07-26: 1 via TOPICAL
  Filled 2015-07-26 (×2): qty 56

## 2015-07-26 MED ORDER — ONDANSETRON HCL 4 MG PO TABS: 4.0000 mg | ORAL_TABLET | ORAL | Status: DC | PRN

## 2015-07-26 MED ORDER — LIDOCAINE HCL (PF) 1 % IJ SOLN
30.0000 mL | INTRAMUSCULAR | Status: DC | PRN
  Filled 2015-07-26: qty 30

## 2015-07-26 MED ORDER — LANOLIN HYDROUS EX OINT: TOPICAL_OINTMENT | CUTANEOUS | Status: DC | PRN

## 2015-07-26 MED ORDER — EPHEDRINE 5 MG/ML INJ
10.0000 mg | INTRAVENOUS | Status: DC | PRN
  Filled 2015-07-26: qty 2

## 2015-07-26 MED ORDER — OXYCODONE-ACETAMINOPHEN 5-325 MG PO TABS: 1.0000 | ORAL_TABLET | ORAL | Status: DC | PRN

## 2015-07-26 MED ORDER — SENNOSIDES-DOCUSATE SODIUM 8.6-50 MG PO TABS
2.0000 | ORAL_TABLET | ORAL | Status: DC
  Administered 2015-07-26 – 2015-07-27 (×2): 2 via ORAL
  Filled 2015-07-26 (×2): qty 2

## 2015-07-26 MED ORDER — OXYTOCIN BOLUS FROM INFUSION: 500.0000 mL | INTRAVENOUS | Status: DC

## 2015-07-26 MED ORDER — PENICILLIN G POTASSIUM 5000000 UNITS IJ SOLR
2.5000 10*6.[IU] | INTRAVENOUS | Status: DC
  Administered 2015-07-26: 2.5 10*6.[IU] via INTRAVENOUS
  Filled 2015-07-26 (×4): qty 2.5

## 2015-07-26 MED ORDER — ONDANSETRON HCL 4 MG/2ML IJ SOLN: 4.0000 mg | INTRAMUSCULAR | Status: DC | PRN

## 2015-07-26 MED ORDER — LIDOCAINE HCL (PF) 1 % IJ SOLN
INTRAMUSCULAR | Status: DC | PRN
  Administered 2015-07-26: 8 mL via EPIDURAL
  Administered 2015-07-26: 7 mL via EPIDURAL

## 2015-07-26 MED ORDER — PENICILLIN G POTASSIUM 5000000 UNITS IJ SOLR
5.0000 10*6.[IU] | Freq: Once | INTRAVENOUS | Status: AC
  Administered 2015-07-26: 5 10*6.[IU] via INTRAVENOUS
  Filled 2015-07-26: qty 5

## 2015-07-26 MED ORDER — ZOLPIDEM TARTRATE 5 MG PO TABS
5.0000 mg | ORAL_TABLET | Freq: Every evening | ORAL | Status: DC | PRN
  Administered 2015-07-26: 5 mg via ORAL
  Filled 2015-07-26: qty 1

## 2015-07-26 MED ORDER — INFLUENZA VAC SPLIT QUAD 0.5 ML IM SUSY: 0.5000 mL | PREFILLED_SYRINGE | INTRAMUSCULAR | Status: DC

## 2015-07-26 MED ORDER — PHENYLEPHRINE 40 MCG/ML (10ML) SYRINGE FOR IV PUSH (FOR BLOOD PRESSURE SUPPORT)
80.0000 ug | PREFILLED_SYRINGE | INTRAVENOUS | Status: DC | PRN
  Filled 2015-07-26: qty 2

## 2015-07-26 MED ORDER — PHENYLEPHRINE 40 MCG/ML (10ML) SYRINGE FOR IV PUSH (FOR BLOOD PRESSURE SUPPORT)
80.0000 ug | PREFILLED_SYRINGE | INTRAVENOUS | Status: DC | PRN
  Filled 2015-07-26: qty 20
  Filled 2015-07-26: qty 2

## 2015-07-26 MED ORDER — LACTATED RINGERS IV SOLN
500.0000 mL | Freq: Once | INTRAVENOUS | Status: DC
Start: 2015-07-26 — End: 2015-07-26

## 2015-07-26 MED ORDER — PRENATAL MULTIVITAMIN CH
1.0000 | ORAL_TABLET | Freq: Every day | ORAL | Status: DC
  Administered 2015-07-27: 1 via ORAL
  Filled 2015-07-26 (×2): qty 1

## 2015-07-26 MED ORDER — DIBUCAINE 1 % RE OINT: 1.0000 "application " | TOPICAL_OINTMENT | RECTAL | Status: DC | PRN

## 2015-07-26 MED ORDER — CITRIC ACID-SODIUM CITRATE 334-500 MG/5ML PO SOLN: 30.0000 mL | ORAL | Status: DC | PRN

## 2015-07-26 MED ORDER — DIPHENHYDRAMINE HCL 25 MG PO CAPS
25.0000 mg | ORAL_CAPSULE | Freq: Four times a day (QID) | ORAL | Status: DC | PRN
  Administered 2015-07-27: 25 mg via ORAL
  Filled 2015-07-26: qty 1

## 2015-07-26 MED ORDER — LACTATED RINGERS IV SOLN: INTRAVENOUS | Status: DC

## 2015-07-26 MED ORDER — TERBUTALINE SULFATE 1 MG/ML IJ SOLN
0.2500 mg | Freq: Once | INTRAMUSCULAR | Status: DC | PRN
  Filled 2015-07-26: qty 1

## 2015-07-26 MED ORDER — OXYTOCIN 10 UNIT/ML IJ SOLN: 2.5000 [IU]/h | INTRAVENOUS | Status: DC

## 2015-07-26 MED ORDER — TETANUS-DIPHTH-ACELL PERTUSSIS 5-2.5-18.5 LF-MCG/0.5 IM SUSP
0.5000 mL | Freq: Once | INTRAMUSCULAR | Status: AC
  Administered 2015-07-27: 0.5 mL via INTRAMUSCULAR
  Filled 2015-07-26: qty 0.5

## 2015-07-26 MED ORDER — DIPHENHYDRAMINE HCL 50 MG/ML IJ SOLN: 12.5000 mg | INTRAMUSCULAR | Status: DC | PRN

## 2015-07-26 NOTE — Anesthesia Preprocedure Evaluation (Signed)
Anesthesia Evaluation  Patient identified by MRN, date of birth, ID band Patient awake    Reviewed: Allergy & Precautions, H&P , NPO status , Patient's Chart, lab work & pertinent test results  Airway Mallampati: I  TM Distance: >3 FB Neck ROM: full    Dental no notable dental hx.    Pulmonary neg pulmonary ROS,    Pulmonary exam normal        Cardiovascular negative cardio ROS Normal cardiovascular exam     Neuro/Psych    GI/Hepatic negative GI ROS, Neg liver ROS,   Endo/Other  negative endocrine ROS  Renal/GU negative Renal ROS     Musculoskeletal   Abdominal Normal abdominal exam  (+)   Peds  Hematology negative hematology ROS (+)   Anesthesia Other Findings   Reproductive/Obstetrics (+) Pregnancy                             Anesthesia Physical Anesthesia Plan  ASA: II  Anesthesia Plan: Epidural   Post-op Pain Management:    Induction:   Airway Management Planned:   Additional Equipment:   Intra-op Plan:   Post-operative Plan:   Informed Consent: I have reviewed the patients History and Physical, chart, labs and discussed the procedure including the risks, benefits and alternatives for the proposed anesthesia with the patient or authorized representative who has indicated his/her understanding and acceptance.     Plan Discussed with:   Anesthesia Plan Comments:         Anesthesia Quick Evaluation

## 2015-07-26 NOTE — Anesthesia Procedure Notes (Signed)
Epidural Patient location during procedure: OB Start time: 07/26/2015 1:25 PM End time: 07/26/2015 1:29 PM  Staffing Anesthesiologist: Lyn Hollingshead  Preanesthetic Checklist Completed: patient identified, surgical consent, pre-op evaluation, timeout performed, IV checked, risks and benefits discussed and monitors and equipment checked  Epidural Patient position: sitting Prep: site prepped and draped and DuraPrep Patient monitoring: continuous pulse ox and blood pressure Approach: midline Location: L3-L4 Injection technique: LOR air  Needle:  Needle type: Tuohy  Needle gauge: 17 G Needle length: 9 cm and 9 Needle insertion depth: 6 cm Catheter type: closed end flexible Catheter size: 19 Gauge Catheter at skin depth: 11 cm Test dose: negative and Other  Assessment Sensory level: T9 Events: blood not aspirated, injection not painful, no injection resistance, negative IV test and no paresthesia  Additional Notes Reason for block:procedure for pain

## 2015-07-26 NOTE — Progress Notes (Signed)
Patient ID: Wanda Nixon, female   DOB: [birth year], 25 y.o.   MRN: [id number]  H&P reviewed, no changes.  IOL secondary to cholestasis  AFVSS gen NAD FHTs 140's, good var, category 1 toco irr  SVE 3.5/50/-3, posterior  Await AROM for PCN IOL with pitocin

## 2015-07-26 NOTE — Progress Notes (Signed)
Patient ID: Wanda Nixon, female   DOB: 1991-03-21, 25 y.o.   MRN: [id number]  Uncomfortable with contractions  AFVSS gen NAD FHTs 150's, mod var, category 1 toco Q 2-63min  SVE 5/80/-2  AROM for clear fluid, w/o diff/comp  Plan for epidural soon. Continue IOL w pitocin Close monitoring

## 2015-07-27 LAB — CBC
HEMATOCRIT: 25.3 % — AB (ref 36.0–46.0)
HEMOGLOBIN: 8.3 g/dL — AB (ref 12.0–15.0)
MCH: 25.3 pg — AB (ref 26.0–34.0)
MCHC: 32.8 g/dL (ref 30.0–36.0)
MCV: 77.1 fL — ABNORMAL LOW (ref 78.0–100.0)
Platelets: 303 10*3/uL (ref 150–400)
RBC: 3.28 MIL/uL — ABNORMAL LOW (ref 3.87–5.11)
RDW: 15.4 % (ref 11.5–15.5)
WBC: 10.5 10*3/uL (ref 4.0–10.5)

## 2015-07-27 LAB — RPR: RPR Ser Ql: NONREACTIVE

## 2015-07-27 MED ORDER — CVS PRENATAL GUMMY 0.4-113.5 MG PO CHEW: 1.0000 | CHEWABLE_TABLET | Freq: Every day | ORAL | Status: DC

## 2015-07-27 MED ORDER — IBUPROFEN 800 MG PO TABS: 800.0000 mg | ORAL_TABLET | Freq: Three times a day (TID) | ORAL | Status: DC | PRN

## 2015-07-27 MED ORDER — FERROUS SULFATE 325 (65 FE) MG PO TABS
325.0000 mg | ORAL_TABLET | Freq: Every day | ORAL | Status: DC
  Administered 2015-07-28: 325 mg via ORAL
  Filled 2015-07-27: qty 1

## 2015-07-27 MED ORDER — OXYCODONE-ACETAMINOPHEN 5-325 MG PO TABS
1.0000 | ORAL_TABLET | Freq: Four times a day (QID) | ORAL | Status: DC | PRN
Start: 2015-07-27 — End: 2015-09-30

## 2015-07-27 NOTE — Lactation Note (Signed)
This note was copied from a baby's chart. Lactation Consultation Note Mom BF her first child who is now 83 months old, for 1 month. Mom plans to breast/formula. States mainly just to BF to give the colostrum and good breast milk at the beginning.  Since has a little one at home, probably will not BF long. Mom has very large pendulum breast w/everted nipple. Skin is very dry. Hand expressed colostrum. Breast very soft and compressible. Mom scratching a lot, states she has PUPS. Gave LPI information sheet.  Educated about LPI newborn behavior, STS, I&O, supply and demand. Hand expression taught to Mom. Mom encouraged to feed baby 8-12 times/24 hours and with feeding cues. Reviewed Baby & Me book's Breastfeeding Basics. [place] brochure given w/resources, support groups and [place] services. Patient Name: Wanda Nixon [id number] Date: 07/27/2015 Reason for consult: Initial assessment   Maternal Data Has patient been taught Hand Expression?: Yes Does the patient have breastfeeding experience prior to this delivery?: Yes  Feeding Feeding Type: Breast Fed Length of feed: 10 min  LATCH Score/Interventions Intervention(s): Breast massage;Breast compression     Type of Nipple: Everted at rest and after stimulation  Comfort (Breast/Nipple): Soft / non-tender     Intervention(s): Skin to skin;Position options;Breastfeeding basics reviewed;Support Pillows     Lactation Tools Discussed/Used Tools: Pump Breast pump type: Double-Electric Breast Pump WIC Program: Yes Pump Review: Setup, frequency, and cleaning;Milk Storage Initiated by:: Allayne Stack RN Date initiated:: 07/27/15   Consult Status Consult Status: Follow-up Date: 07/28/15 Follow-up type: In-patient    Nike Southwell, Elta Gu[place] 07/27/2015, 6:09 AM

## 2015-07-27 NOTE — Progress Notes (Signed)
Post Partum Day 1 Subjective: no complaints, up ad lib, voiding, tolerating PO and nl lochia, pain controlled  Objective: Blood pressure 106/63, pulse 95, temperature 97.9 F (36.6 C), temperature source Oral, resp. rate 18, height 4\' 11"  (1.499 m), weight 61.689 kg (136 lb), last menstrual period 11/09/2014, SpO2 100 %, unknown if currently breastfeeding.  Physical Exam:  General: alert and no distress Lochia: appropriate Uterine Fundus: firm    Recent Labs  07/26/15 0824 07/27/15 0612  HGB 8.6* 8.3*  HCT 26.6* 25.3*    Assessment/Plan: Plan for discharge tomorrow, Breastfeeding and Lactation consult.  Routine care.     LOS: 1 day   Bovard-Stuckert, Zyshonne Malecha 07/27/2015, 8:26 AM

## 2015-07-27 NOTE — Discharge Summary (Signed)
OB Discharge Summary     Patient Name: Molleigh Dishner DOB: 1990-06-28 MRN: [id number]  Date of admission: 07/26/2015 Delivering MD: Janyth Contes   Date of discharge: 07/28/2015  Admitting diagnosis: 57WKS INDUCTION Intrauterine pregnancy: [redacted]w[redacted]d     Secondary diagnosis:  Principal Problem:   SVD (spontaneous vaginal delivery) Active Problems:   Cholestasis of pregnancy in third trimester  Additional problems: none     Discharge diagnosis: Term Pregnancy Delivered                                                                                                Post partum procedures:none  Augmentation: AROM and Pitocin  Complications: None  Hospital course:  Induction of Labor With Vaginal Delivery   25 y.o. yo G2P2002 at [redacted]w[redacted]d was admitted to the hospital 07/26/2015 for induction of labor.  Indication for induction: Cholestasis of pregnancy.  Patient had an uncomplicated labor course as follows: Membrane Rupture Time/Date: 12:16 PM ,07/26/2015   Intrapartum Procedures: Episiotomy: None [1]                                         Lacerations:  1st degree [2];Perineal [11];Periurethral [8]  Patient had delivery of a Viable infant.  Information for the patient's newborn:  Jawanna, Glazier [id number]  Delivery Method: Vaginal, Spontaneous Delivery (Filed from Delivery Summary)   07/26/2015  Details of delivery can be found in separate delivery note.  Patient had a routine postpartum course. Patient is discharged home 07/28/2015.   Physical exam  Filed Vitals:   07/26/15 1702 07/26/15 1752 07/26/15 2230 07/27/15 0620  BP: 128/83 132/73 112/56 106/63  Pulse: 119 115 100 95  Temp: 98.3 F (36.8 C) 98.5 F (36.9 C) 98.3 F (36.8 C) 97.9 F (36.6 C)  TempSrc: Oral Oral Oral Oral  Resp: 20 18 18 18   Height:      Weight:      SpO2:   100% 100%   General: alert and cooperative Lochia: appropriate Uterine Fundus: firm  Labs: Lab Results  Component Value Date    WBC 10.5 07/27/2015   HGB 8.3* 07/27/2015   HCT 25.3* 07/27/2015   MCV 77.1* 07/27/2015   PLT 303 07/27/2015   CMP Latest Ref Rng 08/31/2014  Glucose 70 - 99 mg/dL 87  BUN 6 - 20 mg/dL <5(L)  Creatinine 0.44 - 1.00 mg/dL 0.57  Sodium 135 - 145 mmol/L 138  Potassium 3.5 - 5.1 mmol/L 3.6  Chloride 101 - 111 mmol/L 106  CO2 22 - 32 mmol/L 21(L)  Calcium 8.9 - 10.3 mg/dL 9.0  Total Protein 6.5 - 8.1 g/dL 7.2  Total Bilirubin 0.3 - 1.2 mg/dL 0.8  Alkaline Phos 38 - 126 U/L 118  AST 15 - 41 U/L 29  ALT 14 - 54 U/L 12(L)    Discharge instruction: per After Visit Summary and "Baby and Me Booklet".  After visit meds:    Medication List    ASK your doctor about these medications  acetaminophen 325 MG tablet  Commonly known as:  TYLENOL  Take 650 mg by mouth every 6 (six) hours as needed for mild pain or headache.     albuterol 108 (90 Base) MCG/ACT inhaler  Commonly known as:  PROVENTIL HFA;VENTOLIN HFA  Inhale 1-2 puffs into the lungs every 6 (six) hours as needed for wheezing or shortness of breath.     butalbital-acetaminophen-caffeine 50-325-40 MG tablet  Commonly known as:  FIORICET  Take 1 tablet by mouth every 6 (six) hours as needed for headache.     cetirizine-pseudoephedrine 5-120 MG tablet  Commonly known as:  ZYRTEC-D  Take 1 tablet by mouth daily.     [employer] PRENATAL GUMMY PO  Take 2 tablets by mouth daily.     predniSONE 10 MG tablet  Commonly known as:  DELTASONE  Take 4 tablets (40 mg total) by mouth daily.     terconazole 0.4 % vaginal cream  Commonly known as:  TERAZOL 7  Place 1 applicator vaginally at bedtime. Apply to external surface as well.        Diet: routine diet  Activity: Advance as tolerated. Pelvic rest for 6 weeks.   Outpatient follow up:6 weeks Follow up Appt:No future appointments. Follow up Visit:No Follow-up on file.  Postpartum contraception: Undecided  Newborn Data: Live born female  Birth Weight: 5 lb 7.7 oz (2485  g) APGAR: 8, 9  Baby Feeding: Breast Disposition:home with mother   07/27/2015 Logan Bores, MD

## 2015-07-27 NOTE — Progress Notes (Signed)
MOB was referred for history of depression/anxiety.  Referral is screened out by Clinical Social Worker because none of the following criteria appear to apply: -History of anxiety/depression during this pregnancy, or of post-partum depression. - Diagnosis of anxiety and/or depression within last 3 years or -MOB's symptoms are currently being treated with medication and/or therapy.  This CSW met with MOB in May 2016 after her first child was born. At that time, MOB and FOB reported situational anxiety secondary to wedding planning in 2015.  MOB did not present with acute anxiety at that time. CSW completed chart review since last infant was born, with no acute concerns or changes noted.  Please contact the Clinical Social Worker if needs arise or upon MOB request.   Lucita Ferrara MSW, LCSW [phone]

## 2015-07-27 NOTE — Progress Notes (Signed)
UR chart review completed.  

## 2015-07-27 NOTE — Anesthesia Postprocedure Evaluation (Signed)
Anesthesia Post Note  Patient: Wanda Nixon  Procedure(s) Performed: * No procedures listed *  Patient location during evaluation: Mother Baby Anesthesia Type: Epidural Level of consciousness: oriented, awake and alert and awake Pain management: pain level controlled Vital Signs Assessment: post-procedure vital signs reviewed and stable Respiratory status: spontaneous breathing Cardiovascular status: stable Postop Assessment: adequate PO intake, no signs of nausea or vomiting and patient able to bend at knees Anesthetic complications: no    Last Vitals:  Filed Vitals:   07/26/15 2230 07/27/15 0620  BP: 112/56 106/63  Pulse: 100 95  Temp: 36.8 C 36.6 C  Resp: 18 18    Last Pain:  Filed Vitals:   07/27/15 0647  PainSc: [name]

## 2015-07-27 NOTE — Lactation Note (Signed)
This note was copied from a baby's chart. Lactation Consultation Note  Patient Name: Wanda Nixon [id number] Date: 07/27/2015 Reason for consult: Follow-up assessment;Infant < 6lbs Assisted Mom to obtain good depth with latch. Mom has started to supplement with feedings. LC advised Mom to BF each feeding before giving supplements. Baby should be at the breast 8-12 times in 24 hours and with feeding ques. Post pump for 15 minutes to encourage milk production and to have EBM to supplement. Supplemental guidelines reviewed with Mom. Encouraged to call for assist as desired.   Maternal Data    Feeding Feeding Type: Breast Fed Length of feed: 20 min  LATCH Score/Interventions Latch: Grasps breast easily, tongue down, lips flanged, rhythmical sucking. Intervention(s): Adjust position;Assist with latch;Breast massage;Breast compression  Audible Swallowing: A few with stimulation  Type of Nipple: Everted at rest and after stimulation  Comfort (Breast/Nipple): Soft / non-tender     Hold (Positioning): Assistance needed to correctly position infant at breast and maintain latch. Intervention(s): Breastfeeding basics reviewed;Support Pillows;Position options;Skin to skin  LATCH Score: 8  Lactation Tools Discussed/Used Tools: Pump Breast pump type: Double-Electric Breast Pump   Consult Status Consult Status: Follow-up Date: 07/28/15 Follow-up type: In-patient    Katrine Coho 07/27/2015, 4:03 PM

## 2015-07-28 NOTE — Lactation Note (Signed)
This note was copied from a baby's chart. Lactation Consultation Note Follow up visit at 50 hours of age.  Baby has gained 1%,mom is breast feeding and supplementing with formula.  Mom reports breast are very full and heavy today.  Discussed milk transitioning to larger volume, engorgement care discussed.  Encouraged frequent feedings. Mom to soften breast as needed prior to latch.  Encouraged mom to pump for comfort and offer EBM to baby.  Scheduled appointment for o/p on 4/7 at 4pm to review feeding plan and for feeding assessment due to 37w delivery. Mom to call lactation services as needed.  Mom is awaiting discharge and baby just finished a recent feeding.    Patient Name: Wanda Nixon [id number] Date: 07/28/2015 Reason for consult: Follow-up assessment;Infant < 6lbs;Late preterm infant   Maternal Data Has patient been taught Hand Expression?: Yes  Feeding Feeding Type: Formula Nipple Type: Slow - flow Length of feed: 15 min  LATCH Score/Interventions Latch: Repeated attempts needed to sustain latch, nipple held in mouth throughout feeding, stimulation needed to elicit sucking reflex. Intervention(s): Adjust position;Assist with latch;Breast compression  Audible Swallowing: A few with stimulation Intervention(s): Skin to skin;Hand expression  Type of Nipple: Everted at rest and after stimulation  Comfort (Breast/Nipple): Soft / non-tender     Hold (Positioning): No assistance needed to correctly position infant at breast. Intervention(s): Breastfeeding basics reviewed  LATCH Score: 8  Lactation Tools Discussed/Used     Consult Status Consult Status: Complete    Ulyses Panico, Justine Null 07/28/2015, 9:48 AM

## 2015-07-28 NOTE — Progress Notes (Signed)
Post Partum Day 2 Subjective: no complaints, up ad lib, voiding, tolerating PO and nl lochia, pain controlled  Objective: Blood pressure 113/77, pulse 90, temperature 98.1 F (36.7 C), temperature source Oral, resp. rate 18, height 4\' 11"  (1.499 m), weight 61.689 kg (136 lb), last menstrual period 11/09/2014, SpO2 100 %, unknown if currently breastfeeding.  Physical Exam:  General: alert and no distress Lochia: appropriate Uterine Fundus: firm   Recent Labs  07/26/15 0824 07/27/15 0612  HGB 8.6* 8.3*  HCT 26.6* 25.3*    Assessment/Plan: Discharge home, Breastfeeding and Lactation consult.  D/c with motrin, percocet and PNV, f/u 6 weeks   LOS: 2 days   Bovard-Stuckert, Haileyann Staiger 07/28/2015, 7:02 AM

## 2015-08-07 ENCOUNTER — Encounter (HOSPITAL_COMMUNITY): Payer: Medicaid Other

## 2015-09-11 IMAGING — US US FETAL BPP W/O NONSTRESS
1 series · 10 of 10 positions shown · non-contrast
Comparison: none

[Series 1: us fetal bpp w/o nonstress · non-contrast · 10 acquisitions, 10 frames shown]
[im 1/10]
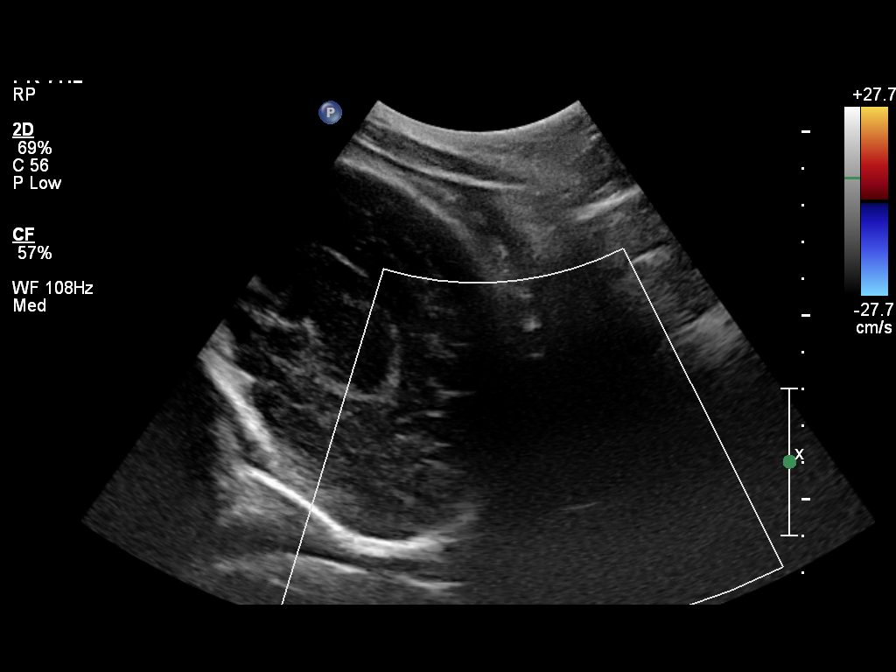
[im 2/10]
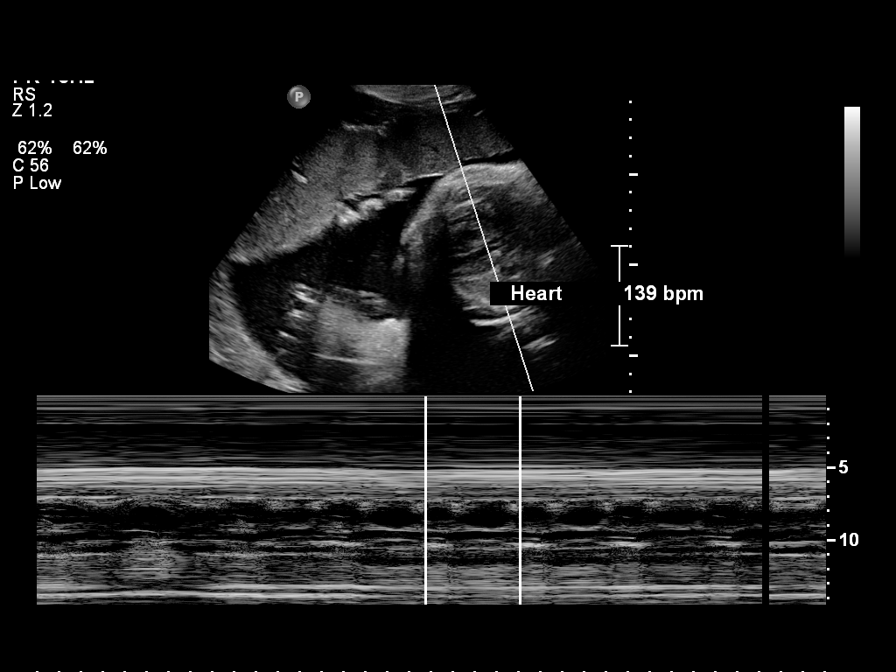
[im 3/10]
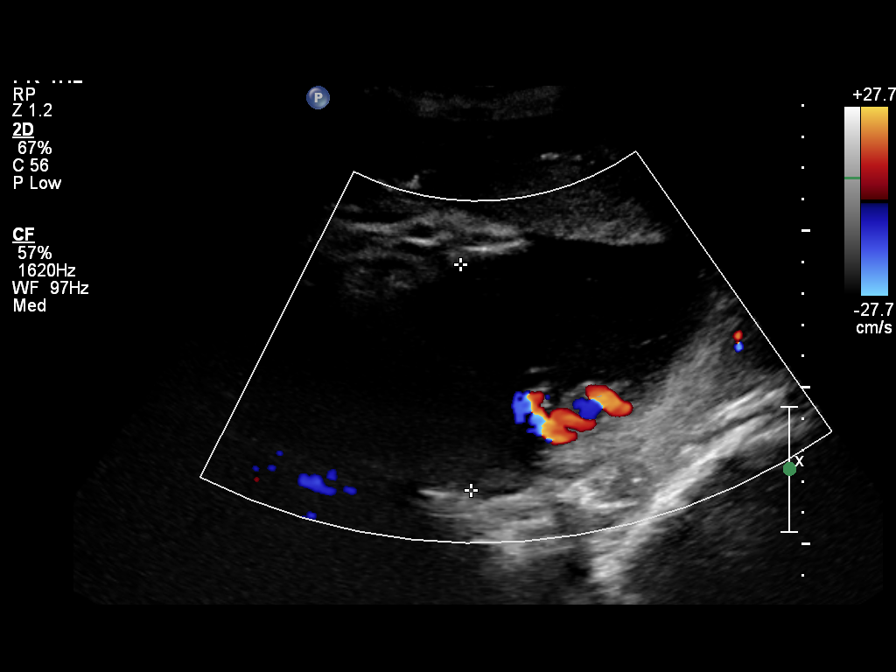
[im 4/10]
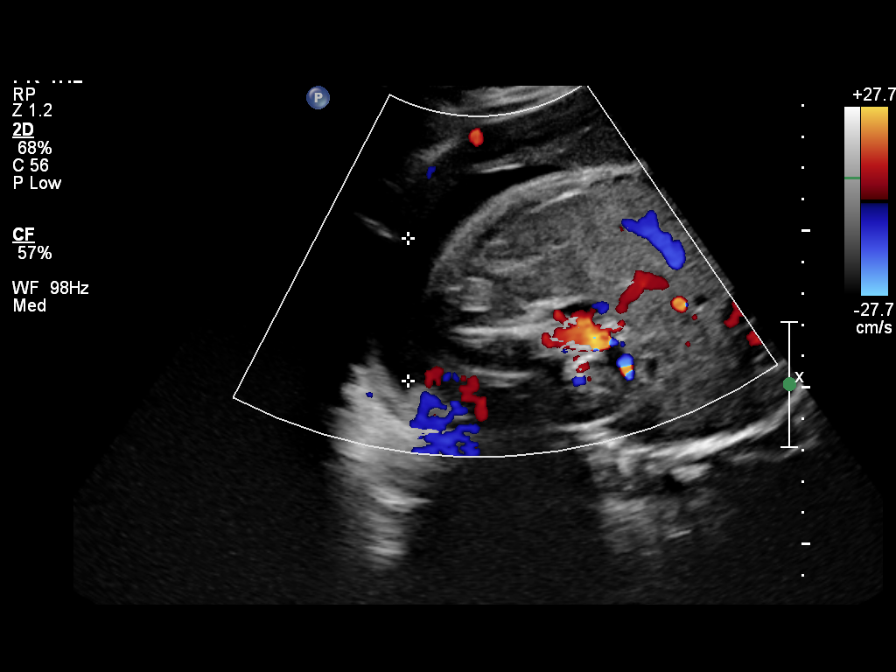
[im 5/10]
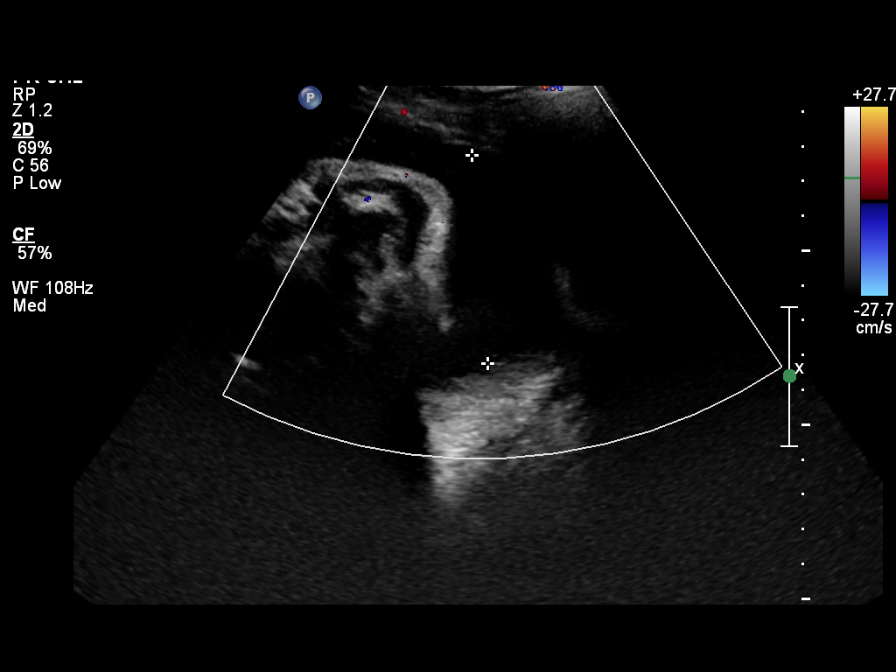
[im 6/10]
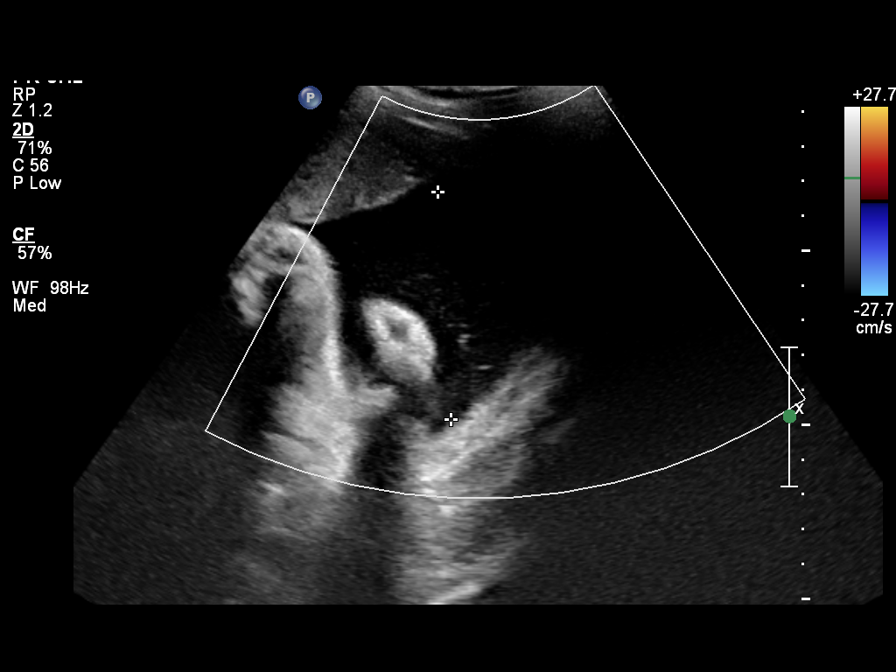
[im 7/10]
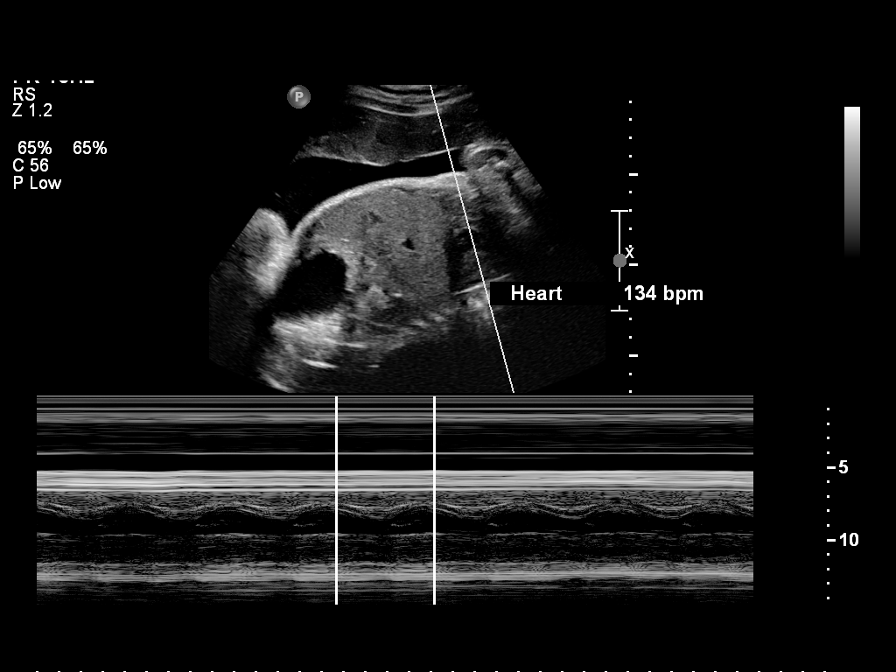
[im 8/10]
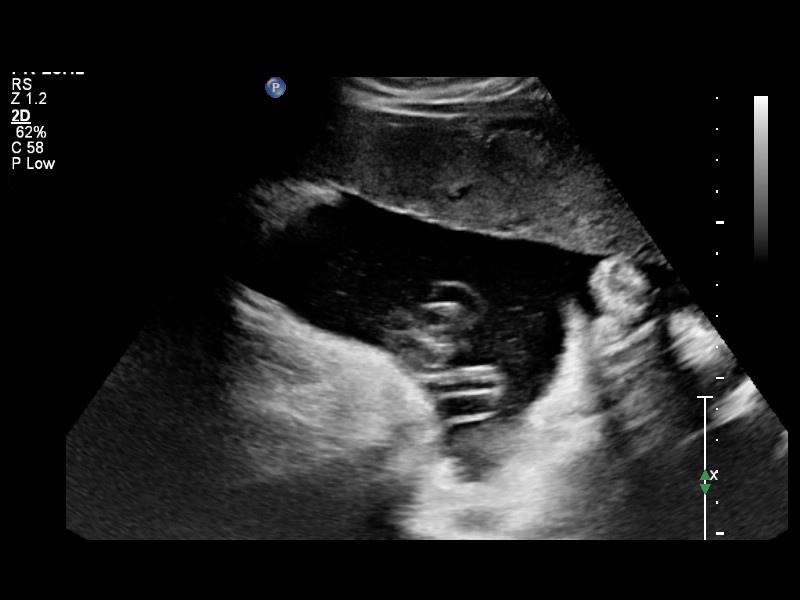
[im 9/10]
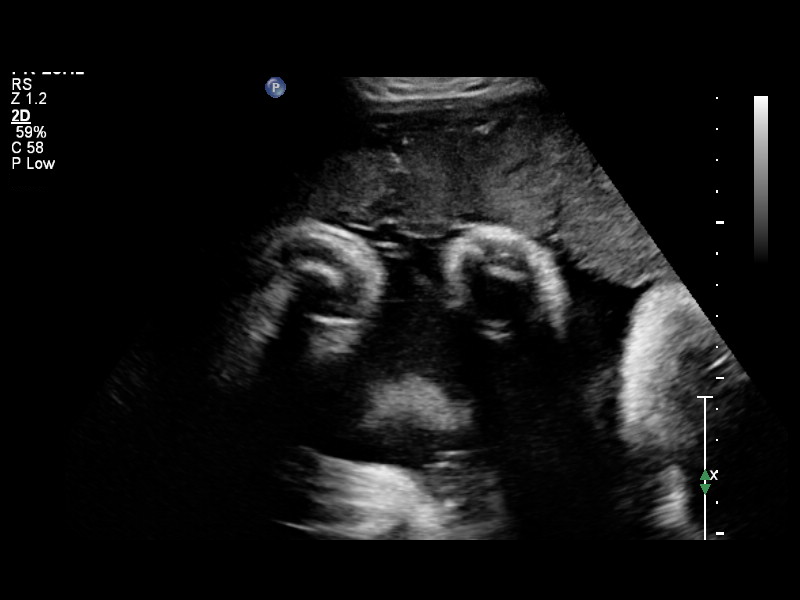
[im 10/10]
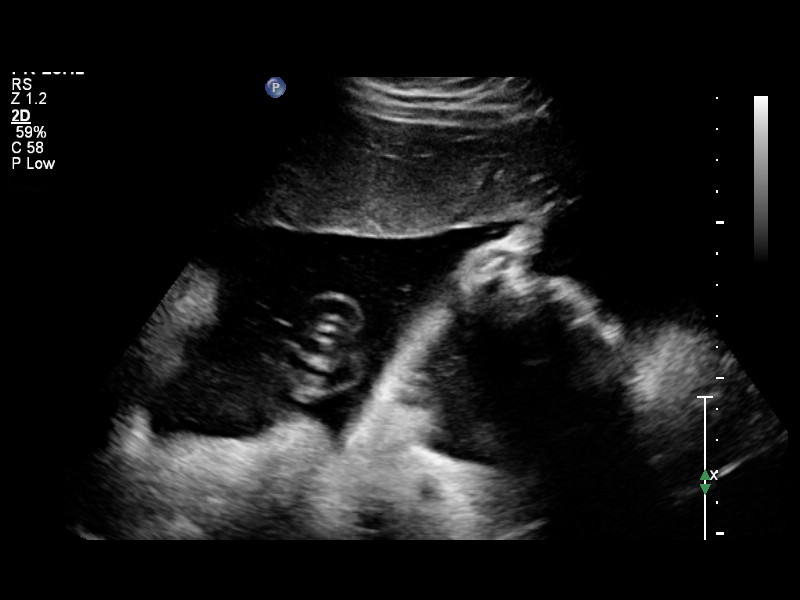

[10 of 10 positions shown; findings below may reference images not displayed]

OBSTETRICS REPORT
(Signed Final 08/31/2014 [DATE])

Service(s) Provided

Indications

Non-reactive NST
38 weeks gestation of pregnancy
Fetal Evaluation

Num Of Fetuses:    1
Fetal Heart Rate:  139                          bpm
Cardiac Activity:  Observed
Presentation:      Cephalic

Amniotic Fluid
AFI FV:      Subjectively upper-normal
AFI Sum:     24.18    cm      96  %Tile      Larg Pckt:   7.17  cm
RUQ:   7.17    cm   RLQ:    6.51   cm    LUQ:    4.53   cm    LLQ:   5.97    cm
Biophysical Evaluation

Amniotic F.V:   Pocket => 2 cm two         F. Tone:         Observed
planes
F. Movement:    Observed                   Score:           [DATE]
F. Breathing:   Not Observed
Gestational Age

LMP:           38w 3d        Date:  12/04/13                 EDD:    09/10/14
Best:          38w 3d     Det. By:  LMP  (12/04/13)          EDD:    09/10/14
Impression

SIUP at 38+3 weeks
High normal amniotic fluid volume
BPP [DATE]  (-2 for no BM)
Recommendations

Follow-up as clinically indicated

## 2015-09-30 ENCOUNTER — Inpatient Hospital Stay (HOSPITAL_COMMUNITY)
Admission: AD | Admit: 2015-09-30 | Discharge: 2015-09-30 | Disposition: A | Discharge status: Home / Self Care | Payer: Medicaid Other | Source: Ambulatory Visit | Attending: Obstetrics and Gynecology | Admitting: Obstetrics and Gynecology

## 2015-09-30 ENCOUNTER — Encounter (HOSPITAL_COMMUNITY): Payer: Self-pay | Admitting: *Deleted

## 2015-09-30 DIAGNOSIS — R109 Unspecified abdominal pain: Secondary | ICD-10-CM

## 2015-09-30 DIAGNOSIS — N921 Excessive and frequent menstruation with irregular cycle: Secondary | ICD-10-CM | POA: Diagnosis not present

## 2015-09-30 DIAGNOSIS — Z3202 Encounter for pregnancy test, result negative: Secondary | ICD-10-CM | POA: Insufficient documentation

## 2015-09-30 DIAGNOSIS — N939 Abnormal uterine and vaginal bleeding, unspecified: Secondary | ICD-10-CM | POA: Insufficient documentation

## 2015-09-30 DIAGNOSIS — J45909 Unspecified asthma, uncomplicated: Secondary | ICD-10-CM | POA: Insufficient documentation

## 2015-09-30 LAB — URINALYSIS, ROUTINE W REFLEX MICROSCOPIC
Bilirubin Urine: NEGATIVE
GLUCOSE, UA: NEGATIVE mg/dL
Ketones, ur: NEGATIVE mg/dL
LEUKOCYTES UA: NEGATIVE
Nitrite: NEGATIVE
PH: 6 (ref 5.0–8.0)
PROTEIN: NEGATIVE mg/dL
Specific Gravity, Urine: 1.02 (ref 1.005–1.030)

## 2015-09-30 LAB — URINE MICROSCOPIC-ADD ON: BACTERIA UA: NONE SEEN

## 2015-09-30 LAB — CBC
HCT: 38.4 % (ref 36.0–46.0)
HEMOGLOBIN: 12.8 g/dL (ref 12.0–15.0)
MCH: 25.7 pg — ABNORMAL LOW (ref 26.0–34.0)
MCHC: 33.3 g/dL (ref 30.0–36.0)
MCV: 77.1 fL — ABNORMAL LOW (ref 78.0–100.0)
PLATELETS: 400 10*3/uL (ref 150–400)
RBC: 4.98 MIL/uL (ref 3.87–5.11)
RDW: 19.2 % — ABNORMAL HIGH (ref 11.5–15.5)
WBC: 4.5 10*3/uL (ref 4.0–10.5)

## 2015-09-30 LAB — POCT PREGNANCY, URINE: Preg Test, Ur: NEGATIVE

## 2015-09-30 LAB — HIV ANTIBODY (ROUTINE TESTING W REFLEX): HIV SCREEN 4TH GENERATION: NONREACTIVE

## 2015-09-30 LAB — RPR: RPR: NONREACTIVE

## 2015-09-30 MED ORDER — KETOROLAC TROMETHAMINE 60 MG/2ML IM SOLN
60.0000 mg | Freq: Once | INTRAMUSCULAR | Status: AC
  Administered 2015-09-30: 60 mg via INTRAMUSCULAR
  Filled 2015-09-30: qty 2

## 2015-09-30 MED ORDER — ONDANSETRON 4 MG PO TBDP
4.0000 mg | ORAL_TABLET | Freq: Once | ORAL | Status: AC
  Administered 2015-09-30: 4 mg via ORAL
  Filled 2015-09-30: qty 1

## 2015-09-30 NOTE — Discharge Instructions (Signed)

## 2015-09-30 NOTE — MAU Note (Signed)
Had a vaginal delivery on March 26th and started depo 5 weeks after that; has been bleeding since the depo; is having some clots also; c/o fatigue and headache also; has been taking Tylenol #3 for the cramping and is having leg pain;

## 2015-09-30 NOTE — MAU Provider Note (Signed)
History     CSN: [id number]  Arrival date and time: 09/30/15 [id number]   First Provider Initiated Contact with Patient 09/30/15 [phone]       Chief Complaint  Patient presents with  . Vaginal Bleeding   HPI  Wanda Nixon is a 25 y.o. G94P2002 female who presents for vaginal bleeding. SVE in March. First depo shot the beginning of May. Reports vaginal bleeding everyday since 1 week s/p depo injection. Initially was light bleeding but became heavy in the last week. States she passed several clots over the weekend up until this morning. Also reports lower abdominal cramping associated with bleeding. Has been tylenol #3 that was prescribed by her PCP without relief.  Called Dr. Terri Piedra this weekend & was told to come in if symptoms worsened.    OB History    Gravida Para Term Preterm AB TAB SAB Ectopic Multiple Living   2 2 2       0 2      Past Medical History  Diagnosis Date  . Headache(784.0)   . Eczema   . Asthma     Last used 08/30/14  . Cholestasis of pregnancy in third trimester 07/25/2015  . SVD (spontaneous vaginal delivery) 07/26/2015    Past Surgical History  Procedure Laterality Date  . Mouth surgery    . Wisdom tooth extraction    . Tympanostomy tube placement      Family History  Problem Relation Age of Onset  . Diabetes Maternal Grandmother   . Hypertension Maternal Grandmother   . Stroke Maternal Grandmother   . High blood pressure Mother   . Hypertension Mother     Social History  Substance Use Topics  . Smoking status: Never Smoker   . Smokeless tobacco: Never Used  . Alcohol Use: No    Allergies:  Allergies  Allergen Reactions  . Pulmicort [Budesonide] Palpitations    Racing heart  . Tomato Hives and Itching  . Other Hives    mushrooms    Prescriptions prior to admission  Medication Sig Dispense Refill Last Dose  . acetaminophen-codeine (TYLENOL #3) 300-30 MG tablet Take 1 tablet by mouth every 4 (four) hours as needed for moderate pain.    09/29/2015 at Unknown time  . albuterol (PROVENTIL HFA;VENTOLIN HFA) 108 (90 BASE) MCG/ACT inhaler Inhale 1-2 puffs into the lungs every 6 (six) hours as needed for wheezing or shortness of breath.   rescue  . cetirizine-pseudoephedrine (ZYRTEC-D) 5-120 MG tablet Take 1 tablet by mouth daily. (Patient not taking: Reported on 09/30/2015) 30 tablet 0 Not Taking at Unknown time  . ibuprofen (ADVIL,MOTRIN) 800 MG tablet Take 1 tablet (800 mg total) by mouth every 8 (eight) hours as needed for moderate pain. (Patient not taking: Reported on 09/30/2015) 45 tablet 1 Not Taking at Unknown time  . oxyCODONE-acetaminophen (PERCOCET/ROXICET) 5-325 MG tablet Take 1-2 tablets by mouth every 6 (six) hours as needed for severe pain. (Patient not taking: Reported on 09/30/2015) 10 tablet 0 Not Taking at Unknown time  . Prenatal Vit-Min-FA-Fish Oil ([employer] PRENATAL GUMMY) 0.4-113.5 MG CHEW Chew 1 tablet by mouth daily. (Patient not taking: Reported on 09/30/2015) 100 tablet 3 Not Taking at Unknown time    Review of Systems  Constitutional: Negative.   Gastrointestinal: Positive for nausea and abdominal pain. Negative for vomiting, diarrhea and constipation.  Genitourinary: Negative for dysuria.       + vaginal bleeding   Physical Exam   Blood pressure 117/79, pulse 80, temperature 98.7 F (37.1  C), temperature source Oral, resp. rate 16, height 4\' 11"  (1.499 m), weight 130 lb (58.968 kg), SpO2 100 %, currently breastfeeding.  Physical Exam  Nursing note and vitals reviewed. Constitutional: She is oriented to person, place, and time. She appears well-developed and well-nourished. No distress.  HENT:  Head: Normocephalic and atraumatic.  Eyes: Conjunctivae are normal. Right eye exhibits no discharge. Left eye exhibits no discharge. No scleral icterus.  Neck: Normal range of motion.  Cardiovascular: Normal rate, regular rhythm and normal heart sounds.   No murmur heard. Respiratory: Effort normal and breath sounds  normal. No respiratory distress. She has no wheezes.  GI: Soft. She exhibits no distension. There is no tenderness.  Genitourinary: Uterus normal. Cervix exhibits no motion tenderness. There is bleeding (minimal amount of dark red blood in canal) in the vagina.  Neurological: She is alert and oriented to person, place, and time.  Skin: Skin is warm and dry. She is not diaphoretic.  Psychiatric: She has a normal mood and affect. Her behavior is normal. Judgment and thought content normal.    MAU Course  Procedures Results for orders placed or performed during the hospital encounter of 09/30/15 (from the past 24 hour(s))  Urinalysis, Routine w reflex microscopic (not at [facility])     Status: Abnormal   Collection Time: 09/30/15  6:50 AM  Result Value Ref Range   Color, Urine YELLOW YELLOW   APPearance CLEAR CLEAR   Specific Gravity, Urine 1.020 1.005 - 1.030   pH 6.0 5.0 - 8.0   Glucose, UA NEGATIVE NEGATIVE mg/dL   Hgb urine dipstick SMALL (A) NEGATIVE   Bilirubin Urine NEGATIVE NEGATIVE   Ketones, ur NEGATIVE NEGATIVE mg/dL   Protein, ur NEGATIVE NEGATIVE mg/dL   Nitrite NEGATIVE NEGATIVE   Leukocytes, UA NEGATIVE NEGATIVE  Urine microscopic-add on     Status: Abnormal   Collection Time: 09/30/15  6:50 AM  Result Value Ref Range   Squamous Epithelial / LPF 0-5 (A) NONE SEEN   WBC, UA 0-5 0 - 5 WBC/hpf   RBC / HPF 0-5 0 - 5 RBC/hpf   Bacteria, UA NONE SEEN NONE SEEN  Pregnancy, urine POC     Status: None   Collection Time: 09/30/15  6:58 AM  Result Value Ref Range   Preg Test, Ur NEGATIVE NEGATIVE  CBC     Status: Abnormal   Collection Time: 09/30/15  8:18 AM  Result Value Ref Range   WBC 4.5 4.0 - 10.5 K/uL   RBC 4.98 3.87 - 5.11 MIL/uL   Hemoglobin 12.8 12.0 - 15.0 g/dL   HCT 38.4 36.0 - 46.0 %   MCV 77.1 (L) 78.0 - 100.0 fL   MCH 25.7 (L) 26.0 - 34.0 pg   MCHC 33.3 30.0 - 36.0 g/dL   RDW 19.2 (H) 11.5 - 15.5 %   Platelets 400 150 - 400 K/uL    MDM UPT negative HGB  normal VSS Zofran & toradol given in MAU S/w Dr. Melba Coon -- pt ok to go home. Call office if symptoms return.   Assessment and Plan  A: 1. Breakthrough bleeding on depo provera   2. Abdominal cramping     P; Discharge home Ibuprofen otc prn pain Call office for follow up as needed  Jorje Guild 09/30/2015, 8:38 AM

## 2015-09-30 NOTE — MAU Note (Signed)
Pt reports she had a vaginal delivery on 03/25 and had depo at her 6 week check up. States she has been bleeding every day since the depo, is having lower abd cramping and is passing clots, feels dizzy and fatigued, headaches.

## 2015-12-18 ENCOUNTER — Inpatient Hospital Stay (HOSPITAL_COMMUNITY)
Admission: AD | Admit: 2015-12-18 | Discharge: 2015-12-18 | Disposition: A | Discharge status: Home / Self Care | Payer: Medicaid Other | Source: Ambulatory Visit | Attending: Obstetrics and Gynecology | Admitting: Obstetrics and Gynecology

## 2015-12-18 ENCOUNTER — Encounter (HOSPITAL_COMMUNITY): Payer: Self-pay

## 2015-12-18 DIAGNOSIS — Z888 Allergy status to other drugs, medicaments and biological substances status: Secondary | ICD-10-CM | POA: Insufficient documentation

## 2015-12-18 DIAGNOSIS — K5900 Constipation, unspecified: Secondary | ICD-10-CM | POA: Insufficient documentation

## 2015-12-18 DIAGNOSIS — N938 Other specified abnormal uterine and vaginal bleeding: Secondary | ICD-10-CM | POA: Insufficient documentation

## 2015-12-18 DIAGNOSIS — R42 Dizziness and giddiness: Secondary | ICD-10-CM

## 2015-12-18 DIAGNOSIS — N920 Excessive and frequent menstruation with regular cycle: Secondary | ICD-10-CM

## 2015-12-18 DIAGNOSIS — Z79899 Other long term (current) drug therapy: Secondary | ICD-10-CM | POA: Insufficient documentation

## 2015-12-18 DIAGNOSIS — K5901 Slow transit constipation: Secondary | ICD-10-CM

## 2015-12-18 DIAGNOSIS — J45909 Unspecified asthma, uncomplicated: Secondary | ICD-10-CM | POA: Insufficient documentation

## 2015-12-18 DIAGNOSIS — Z9889 Other specified postprocedural states: Secondary | ICD-10-CM | POA: Insufficient documentation

## 2015-12-18 DIAGNOSIS — K319 Disease of stomach and duodenum, unspecified: Secondary | ICD-10-CM | POA: Insufficient documentation

## 2015-12-18 LAB — URINALYSIS, ROUTINE W REFLEX MICROSCOPIC
BILIRUBIN URINE: NEGATIVE
GLUCOSE, UA: NEGATIVE mg/dL
Ketones, ur: NEGATIVE mg/dL
Leukocytes, UA: NEGATIVE
Nitrite: NEGATIVE
PROTEIN: NEGATIVE mg/dL
Specific Gravity, Urine: 1.015 (ref 1.005–1.030)
pH: 6.5 (ref 5.0–8.0)

## 2015-12-18 LAB — CBC
HEMATOCRIT: 36.9 % (ref 36.0–46.0)
HEMOGLOBIN: 12.7 g/dL (ref 12.0–15.0)
MCH: 27.1 pg (ref 26.0–34.0)
MCHC: 34.4 g/dL (ref 30.0–36.0)
MCV: 78.7 fL (ref 78.0–100.0)
Platelets: 382 10*3/uL (ref 150–400)
RBC: 4.69 MIL/uL (ref 3.87–5.11)
RDW: 14.7 % (ref 11.5–15.5)
WBC: 4.8 10*3/uL (ref 4.0–10.5)

## 2015-12-18 LAB — WET PREP, GENITAL
Clue Cells Wet Prep HPF POC: NONE SEEN
Sperm: NONE SEEN
TRICH WET PREP: NONE SEEN
WBC, Wet Prep HPF POC: NONE SEEN
YEAST WET PREP: NONE SEEN

## 2015-12-18 LAB — HIV ANTIBODY (ROUTINE TESTING W REFLEX): HIV Screen 4th Generation wRfx: NONREACTIVE

## 2015-12-18 LAB — URINE MICROSCOPIC-ADD ON
BACTERIA UA: NONE SEEN
WBC UA: NONE SEEN WBC/hpf (ref 0–5)

## 2015-12-18 LAB — POCT PREGNANCY, URINE: Preg Test, Ur: NEGATIVE

## 2015-12-18 LAB — RPR: RPR Ser Ql: NONREACTIVE

## 2015-12-18 MED ORDER — POLYETHYLENE GLYCOL 3350 17 G PO PACK: 17.0000 g | PACK | Freq: Every day | ORAL | 0 refills | Status: DC

## 2015-12-18 NOTE — MAU Provider Note (Signed)
History     CSN: [id number]  Arrival date and time: 12/18/15 0706   First Provider Initiated Contact with Patient 12/18/15 [phone]      Chief Complaint  Patient presents with  . Vaginal Bleeding  . Dizziness   HPI   Wanda Nixon is a 25 year old G2 P2 who is several months postpartum who presents for heavy bleeding and lightheadedness. She reports that her periods started 3 days ago on Wednesday and has been very heavy. This is the first. She's had since the birth of her most recent child. She did receive Depo-Provera after delivery but did not receive her second injection. She had been exclusively breast-feeding until 3 weeks ago when she returned to work. Patient did become lightheaded and nearly passed out at work several days ago. She has had some continuous lightheadedness since. She also reports severe rectal pressure with the onset of her period was so bad on Wednesday that she could not sit down. She reports her last bowel movement was Tuesday and she has irregular bowel movements. She reports she has been using a tampon approximately every 30 minutes to 1 hour and almost as soon as she puts it in she feels as though its full and not feeling right.  OB History    Gravida Para Term Preterm AB Living   2 2 2     2    SAB TAB Ectopic Multiple Live Births         0 2      Past Medical History:  Diagnosis Date  . Asthma    Last used 08/30/14  . Cholestasis of pregnancy in third trimester 07/25/2015  . Eczema   . Headache(784.0)   . SVD (spontaneous vaginal delivery) 07/26/2015    Past Surgical History:  Procedure Laterality Date  . MOUTH SURGERY    . TYMPANOSTOMY TUBE PLACEMENT    . WISDOM TOOTH EXTRACTION      Family History  Problem Relation Age of Onset  . Diabetes Maternal Grandmother   . Hypertension Maternal Grandmother   . Stroke Maternal Grandmother   . High blood pressure Mother   . Hypertension Mother     Social History  Substance Use Topics  . Smoking  status: Never Smoker  . Smokeless tobacco: Never Used  . Alcohol use No    Allergies:  Allergies  Allergen Reactions  . Pulmicort [Budesonide] Palpitations    Racing heart  . Tomato Hives and Itching  . Other Hives    mushrooms    Prescriptions Prior to Admission  Medication Sig Dispense Refill Last Dose  . Acetaminophen-Caff-Pyrilamine (MIDOL COMPLETE PO) Take 2 tablets by mouth 2 (two) times daily as needed (for pain).   12/18/2015 at Unknown time  . albuterol (PROVENTIL HFA;VENTOLIN HFA) 108 (90 BASE) MCG/ACT inhaler Inhale 1-2 puffs into the lungs every 6 (six) hours as needed for wheezing or shortness of breath.   Past Week at Unknown time    Review of Systems  Constitutional: Negative for chills and fever.  HENT: Positive for nosebleeds and tinnitus. Negative for congestion.   Eyes: Negative for blurred vision and double vision.  Respiratory: Negative for cough and hemoptysis.   Cardiovascular: Negative for chest pain and palpitations.  Gastrointestinal: Positive for abdominal pain and constipation. Negative for heartburn, nausea and vomiting.  Genitourinary: Negative for dysuria and urgency.  Musculoskeletal: Negative for myalgias.  Skin: Negative for itching and rash.  Neurological: Negative for dizziness, tingling and headaches.  Endo/Heme/Allergies: Does not bruise/bleed easily.  Physical Exam   Temperature 98.4 F (36.9 C), temperature source Oral, resp. rate 18, height 4\' 11"  (1.499 m), weight 138 lb (62.6 kg), last menstrual period 12/16/2015, currently breastfeeding.  Physical Exam  Constitutional: She is oriented to person, place, and time. She appears well-developed and well-nourished.  HENT:  Head: Normocephalic and atraumatic.  Eyes: Pupils are equal, round, and reactive to light.  Cardiovascular: Normal rate and regular rhythm.   Respiratory: Effort normal and breath sounds normal. No respiratory distress.  GI: Soft. Bowel sounds are normal. She  exhibits no distension. There is no tenderness.  Genitourinary:  Genitourinary Comments: Significant blood in the vaginal vault. Significant stool felt in the rectum on bimanual exam. No cervical motion tenderness.  Musculoskeletal: Normal range of motion.  Neurological: She is alert and oriented to person, place, and time.  Skin: Skin is warm and dry.    MAU Course  Procedures  MDM In the MAU patient remained asymptomatic while lying in bed. Vital signs were monitored with no significant change. She underwent pelvic examination which did reveal significant stool which is likely causing her rectal pressure. We did discuss extensively the fact that the first period after delivery can be heavier than usual. Her hemoglobin is not dangerous level. Additionally we discussed that being on Depakote can make that first period even heavier. We discussed continued aggressive hydration and need for continued prenatal vitamin use if she is having a heavy period.  Labs were reviewed and urinalysis was unremarkable aside from blood, wet prep was negative. Gonorrhea and chlamydia are pending. Hemoglobin was 12.7  Assessment and Plan  Heavy menstrual bleeding: Patient does not currently have insurance and does not wish to pursue any medical treatment. She just wanted to make sure there was nothing to be concerned about. She will continue to rest and hydrate. She is on day 3 of her period. She is reassured and okay for discharge. She can follow up with her OB/GYN if her period does not resolve shortly. Dizziness: Unclear etiology patient's vital signs are within normal limits with no hypotension. Hemoglobin reviewed and normal. Advised by mouth hydration. Constipation: Patient with significant stool palpable in the rectum on exam. Advised MiraLAX when necessary.   Wanda Nixon 12/18/2015, 8:56 AM

## 2015-12-18 NOTE — Discharge Instructions (Signed)
You can use MiraLAX as needed to help with constipation. Dissolve 1 capful in 8 ounces of a liquid of your choice. He can take 1-2 days until you having a daily bowel movement for at least a week.  Menorrhagia Menorrhagia is the medical term for when your menstrual periods are heavy or last longer than usual. With menorrhagia, every period you have may cause enough blood loss and cramping that you are unable to maintain your usual activities. CAUSES  In some cases, the cause of heavy periods is unknown, but a number of conditions may cause menorrhagia. Common causes include:  A problem with the hormone-producing thyroid gland (hypothyroid).  Noncancerous growths in the uterus (polyps or fibroids).  An imbalance of the estrogen and progesterone hormones.  One of your ovaries not releasing an egg during one or more months.  Side effects of having an intrauterine device (IUD).  Side effects of some medicines, such as anti-inflammatory medicines or blood thinners.  A bleeding disorder that stops your blood from clotting normally. SIGNS AND SYMPTOMS  During a normal period, bleeding lasts between 4 and 8 days. Signs that your periods are too heavy include:  You routinely have to change your pad or tampon every 1 or 2 hours because it is completely soaked.  You pass blood clots larger than 1 inch (2.5 cm) in size.  You have bleeding for more than 7 days.  You need to use pads and tampons at the same time because of heavy bleeding.  You need to wake up to change your pads or tampons during the night.  You have symptoms of anemia, such as tiredness, fatigue, or shortness of breath. DIAGNOSIS  Your health care provider will perform a physical exam and ask you questions about your symptoms and menstrual history. Other tests may be ordered based on what the health care provider finds during the exam. These tests can include:  Blood tests. Blood tests are used to check if you are pregnant  or have hormonal changes, a bleeding or thyroid disorder, low iron levels (anemia), or other problems.  Endometrial biopsy. Your health care provider takes a sample of tissue from the inside of your uterus to be examined under a microscope.  Pelvic ultrasound. This test uses sound waves to make a picture of your uterus, ovaries, and vagina. The pictures can show if you have fibroids or other growths.  Hysteroscopy. For this test, your health care provider will use a small telescope to look inside your uterus. Based on the results of your initial tests, your health care provider may recommend further testing. TREATMENT  Treatment may not be needed. If it is needed, your health care provider may recommend treatment with one or more medicines first. If these do not reduce bleeding enough, a surgical treatment might be an option. The best treatment for you will depend on:   Whether you need to prevent pregnancy.  Your desire to have children in the future.  The cause and severity of your bleeding.  Your opinion and personal preference.  Medicines for menorrhagia may include:  Birth control methods that use hormones. These include the pill, skin patch, vaginal ring, shots that you get every 3 months, hormonal IUD, and implant. These treatments reduce bleeding during your menstrual period.  Medicines that thicken blood and slow bleeding.  Medicines that reduce swelling, such as ibuprofen.  Medicines that contain a synthetic hormone called progestin.   Medicines that make the ovaries stop working for a short  time.  You may need surgical treatment for menorrhagia if the medicines are unsuccessful. Treatment options include:  Dilation and curettage (D&C). In this procedure, your health care provider opens (dilates) your cervix and then scrapes or suctions tissue from the lining of your uterus to reduce menstrual bleeding.  Operative hysteroscopy. This procedure uses a tiny tube with a  light (hysteroscope) to view your uterine cavity and can help in the surgical removal of a polyp that may be causing heavy periods.  Endometrial ablation. Through various techniques, your health care provider permanently destroys the entire lining of your uterus (endometrium). After endometrial ablation, most women have little or no menstrual flow. Endometrial ablation reduces your ability to become pregnant.  Endometrial resection. This surgical procedure uses an electrosurgical wire loop to remove the lining of the uterus. This procedure also reduces your ability to become pregnant.  Hysterectomy. Surgical removal of the uterus and cervix is a permanent procedure that stops menstrual periods. Pregnancy is not possible after a hysterectomy. This procedure requires anesthesia and hospitalization. HOME CARE INSTRUCTIONS   Only take over-the-counter or prescription medicines as directed by your health care provider. Take prescribed medicines exactly as directed. Do not change or switch medicines without consulting your health care provider.  Take any prescribed iron pills exactly as directed by your health care provider. Long-term heavy bleeding may result in low iron levels. Iron pills help replace the iron your body lost from heavy bleeding. Iron may cause constipation. If this becomes a problem, increase the bran, fruits, and roughage in your diet.  Do not take aspirin or medicines that contain aspirin 1 week before or during your menstrual period. Aspirin may make the bleeding worse.  If you need to change your sanitary pad or tampon more than once every 2 hours, stay in bed and rest as much as possible until the bleeding stops.  Eat well-balanced meals. Eat foods high in iron. Examples are leafy green vegetables, meat, liver, eggs, and whole grain breads and cereals. Do not try to lose weight until the abnormal bleeding has stopped and your blood iron level is back to normal. SEEK MEDICAL CARE  IF:   You soak through a pad or tampon every 1 or 2 hours, and this happens every time you have a period.  You need to use pads and tampons at the same time because you are bleeding so much.  You need to change your pad or tampon during the night.  You have a period that lasts for more than 8 days.  You pass clots bigger than 1 inch wide.  You have irregular periods that happen more or less often than once a month.  You feel dizzy or faint.  You feel very weak or tired.  You feel short of breath or feel your heart is beating too fast when you exercise.  You have nausea and vomiting or diarrhea while you are taking your medicine.  You have any problems that may be related to the medicine you are taking. SEEK IMMEDIATE MEDICAL CARE IF:   You soak through 4 or more pads or tampons in 2 hours.  You have any bleeding while you are pregnant. MAKE SURE YOU:   Understand these instructions.  Will watch your condition.  Will get help right away if you are not doing well or get worse.   This information is not intended to replace advice given to you by your health care provider. Make sure you discuss any questions you  have with your health care provider.   Document Released: 04/18/2005 Document Revised: 04/23/2013 Document Reviewed: 10/07/2012 Elsevier Interactive Patient Education 2016 [employer].    Constipation, Adult Constipation is when a person has fewer than three bowel movements a week, has difficulty having a bowel movement, or has stools that are dry, hard, or larger than normal. As people grow older, constipation is more common. A low-fiber diet, not taking in enough fluids, and taking certain medicines may make constipation worse.  CAUSES   Certain medicines, such as antidepressants, pain medicine, iron supplements, antacids, and water pills.   Certain diseases, such as diabetes, irritable bowel syndrome (IBS), thyroid disease, or depression.   Not drinking  enough water.   Not eating enough fiber-rich foods.   Stress or travel.   Lack of physical activity or exercise.   Ignoring the urge to have a bowel movement.   Using laxatives too much.  SIGNS AND SYMPTOMS   Having fewer than three bowel movements a week.   Straining to have a bowel movement.   Having stools that are hard, dry, or larger than normal.   Feeling full or bloated.   Pain in the lower abdomen.   Not feeling relief after having a bowel movement.  DIAGNOSIS  Your health care provider will take a medical history and perform a physical exam. Further testing may be done for severe constipation. Some tests may include:  A barium enema X-ray to examine your rectum, colon, and, sometimes, your small intestine.   A sigmoidoscopy to examine your lower colon.   A colonoscopy to examine your entire colon. TREATMENT  Treatment will depend on the severity of your constipation and what is causing it. Some dietary treatments include drinking more fluids and eating more fiber-rich foods. Lifestyle treatments may include regular exercise. If these diet and lifestyle recommendations do not help, your health care provider may recommend taking over-the-counter laxative medicines to help you have bowel movements. Prescription medicines may be prescribed if over-the-counter medicines do not work.  HOME CARE INSTRUCTIONS   Eat foods that have a lot of fiber, such as fruits, vegetables, whole grains, and beans.  Limit foods high in fat and processed sugars, such as french fries, hamburgers, cookies, candies, and soda.   A fiber supplement may be added to your diet if you cannot get enough fiber from foods.   Drink enough fluids to keep your urine clear or pale yellow.   Exercise regularly or as directed by your health care provider.   Go to the restroom when you have the urge to go. Do not hold it.   Only take over-the-counter or prescription medicines as  directed by your health care provider. Do not take other medicines for constipation without talking to your health care provider first.  [place] IF:   You have bright red blood in your stool.   Your constipation lasts for more than 4 days or gets worse.   You have abdominal or rectal pain.   You have thin, pencil-like stools.   You have unexplained weight loss. MAKE SURE YOU:   Understand these instructions.  Will watch your condition.  Will get help right away if you are not doing well or get worse.   This information is not intended to replace advice given to you by your health care provider. Make sure you discuss any questions you have with your health care provider.   Document Released: 01/15/2004 Document Revised: 05/09/2014 Document Reviewed: 01/28/2013  Elsevier Interactive Patient Education ©2016 Elsevier Inc. ° °

## 2015-12-18 NOTE — MAU Note (Signed)
Pt presents to MAU with heavy vaginal bleeding that started Wed and has increasingly gotten heavier. She reports this is her first period since having her baby 4 months ago. Was on Depo, however did not get it this month. Does have a prescription for birth control pills, but has not started taking them. Pt is feeling dizzy and looses balance time to time. Pt also has a bump on her wist that is bothering her.

## 2015-12-21 LAB — GC/CHLAMYDIA PROBE AMP (~~LOC~~) NOT AT ARMC
Chlamydia: NEGATIVE
Neisseria Gonorrhea: NEGATIVE

## 2016-01-26 ENCOUNTER — Ambulatory Visit (INDEPENDENT_AMBULATORY_CARE_PROVIDER_SITE_OTHER): Payer: 59 | Admitting: Physician Assistant

## 2016-01-26 VITALS — BP 130/80 | HR 96 | Temp 97.5°F | Resp 17 | Ht 59.0 in | Wt 138.0 lb

## 2016-01-26 DIAGNOSIS — R42 Dizziness and giddiness: Secondary | ICD-10-CM

## 2016-01-26 DIAGNOSIS — F411 Generalized anxiety disorder: Secondary | ICD-10-CM

## 2016-01-26 LAB — POCT CBC
GRANULOCYTE PERCENT: 75.9 % (ref 37–80)
HEMATOCRIT: 37.9 % (ref 37.7–47.9)
Hemoglobin: 13 g/dL (ref 12.2–16.2)
Lymph, poc: 1.4 (ref 0.6–3.4)
MCH: 27.8 pg (ref 27–31.2)
MCHC: 34.4 g/dL (ref 31.8–35.4)
MCV: 80.8 fL (ref 80–97)
MID (CBC): 0.3 (ref 0–0.9)
MPV: 7.8 fL (ref 0–99.8)
PLATELET COUNT, POC: 353 10*3/uL (ref 142–424)
POC GRANULOCYTE: 5.3 (ref 2–6.9)
POC LYMPH %: 20.3 % (ref 10–50)
POC MID %: 3.8 % (ref 0–12)
RBC: 4.69 M/uL (ref 4.04–5.48)
RDW, POC: 15.2 %
WBC: 7 10*3/uL (ref 4.6–10.2)

## 2016-01-26 LAB — POCT GLYCOSYLATED HEMOGLOBIN (HGB A1C): Hemoglobin A1C: 5.5

## 2016-01-26 MED ORDER — SERTRALINE HCL 50 MG PO TABS: 50.0000 mg | ORAL_TABLET | Freq: Every day | ORAL | 3 refills | Status: DC

## 2016-01-26 MED ORDER — PROPRANOLOL HCL ER 60 MG PO CP24: 60.0000 mg | ORAL_CAPSULE | Freq: Every day | ORAL | 1 refills | Status: DC

## 2016-01-26 NOTE — Patient Instructions (Addendum)
Please take medication as prescribed.  You can add half tablet weekly.  So take 25mg  daily week 1, then 50 week 2, then 75 week 3.  I recommend only going up to 100mg  until we meet again in 4-6 weeks.  I will have your lab results within the next week.

## 2016-01-27 ENCOUNTER — Ambulatory Visit: Payer: Medicaid Other

## 2016-01-27 ENCOUNTER — Telehealth: Payer: Self-pay

## 2016-01-27 LAB — BASIC METABOLIC PANEL
BUN: 13 mg/dL (ref 7–25)
CHLORIDE: 105 mmol/L (ref 98–110)
CO2: 26 mmol/L (ref 20–31)
Calcium: 9.7 mg/dL (ref 8.6–10.2)
Creat: 0.68 mg/dL (ref 0.50–1.10)
GLUCOSE: 81 mg/dL (ref 65–99)
POTASSIUM: 4.4 mmol/L (ref 3.5–5.3)
Sodium: 140 mmol/L (ref 135–146)

## 2016-01-27 LAB — TSH: TSH: 3.17 mIU/L

## 2016-01-27 NOTE — Progress Notes (Signed)
[facility] [address] [place] [address] [phone]  Date:  01/26/2016   Name:  Wanda Nixon   DOB:  04/03/1991   MRN:  [id number]  PCP:  REDMAN,MICHELLE, MD    History of Present Illness:  Wanda Nixon is a 25 y.o. female patient who presents to [facility] for chief complaint of dizziness and anxiety. Patient is here today with complaint of 6 months of episodic dizziness. Patient reports that she will have 25 minutes of dizziness accompanied by nausea, tremulousness, choking sensation, tinnitus. This can be also associated with chest pains, and palpitations. Patient reports a history of anxiety that has worsened after the birth of the 31 month old. She has 2 children one of whom she delivered a year ago. 3 months later she was pregnant with her second and last child. She reports feeling overwhelmed at home with 2 little ones. She also has high stress at her job. 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 She became concerned last week when she had a crying spell in the bathroom and felt dizzy. She contacted her sister put a warm rag that was wet along her eyes and sat on the bathroom floor until her symptoms subsided. Patient has been diagnosed with anxiety where she was given Xanax. She reports that this is helped with her anxiety. She has also been given Lexapro. This made her feel like a zombie. She is not able to "me on the dosage of that drug. Patient lives with her husband and reports that she does feel safe. She reports that he is supportive and helpful at the home with her 2 children. She is no longer breast-feeding. Patient denies any heavy alcohol use. No tobacco use. No illicit drug use. Patient's history of palpitations has been evaluated and worked up by cardiology. This was found to be unremarkable and likely secondary to anxiety. Patient denies any depressive symptoms. She denies  any change in interest, energy, abnormal sleep patterns, high agitation or concentration issues. She denies any suicidal or homicidal ideations.   Patient Active Problem List   Diagnosis Date Noted  . SVD (spontaneous vaginal delivery) 07/26/2015  . Cholestasis of pregnancy in third trimester 07/25/2015  . Normal labor and delivery 08/31/2014  . Non-reactive NST (non-stress test)   . Non-stress test nonreactive   . NST (non-stress test) nonreactive   . Migraine, unspecified, without mention of intractable migraine without mention of status migrainosus 10/25/2013    Past Medical History:  Diagnosis Date  . Asthma    Last used 08/30/14  . Cholestasis of pregnancy in third trimester 07/25/2015  . Eczema   . Headache(784.0)   . SVD (spontaneous vaginal delivery) 07/26/2015    Past Surgical History:  Procedure Laterality Date  . MOUTH SURGERY    . TYMPANOSTOMY TUBE PLACEMENT    . WISDOM TOOTH EXTRACTION      Social History  Substance Use Topics  . Smoking status: Never Smoker  . Smokeless tobacco: Never Used  . Alcohol use No    Family History  Problem Relation Age of Onset  . Diabetes Maternal Grandmother   . Hypertension Maternal Grandmother   . Stroke Maternal Grandmother   . High blood pressure Mother   . Hypertension Mother     Allergies  Allergen Reactions  . Pulmicort [Budesonide] Palpitations    Racing heart  . Tomato Hives and Itching  .  Other Hives    mushrooms    Medication list has been reviewed and updated.  Current Outpatient Prescriptions on File Prior to Visit  Medication Sig Dispense Refill  . Acetaminophen-Caff-Pyrilamine (MIDOL COMPLETE PO) Take 2 tablets by mouth 2 (two) times daily as needed (for pain).    Marland Kitchen albuterol (PROVENTIL HFA;VENTOLIN HFA) 108 (90 BASE) MCG/ACT inhaler Inhale 1-2 puffs into the lungs every 6 (six) hours as needed for wheezing or shortness of breath.     No current facility-administered medications on file prior to  visit.     ROS ROS otherwise unremarkable unless listed above.  Physical Examination: BP 130/80 (BP Location: Right Arm, Patient Position: Sitting, Cuff Size: Normal)   Pulse 96   Temp 97.5 F (36.4 C) (Oral)   Resp 17   Ht 4\' 11"  (1.499 m)   Wt 138 lb (62.6 kg)   LMP 01/05/2016 (Approximate)   SpO2 100%   Breastfeeding? No   BMI 27.87 kg/m  Ideal Body Weight: Weight in (lb) to have BMI = 25: 123.5  Physical Exam  Constitutional: She is oriented to person, place, and time. She appears well-developed and well-nourished. No distress.  HENT:  Head: Normocephalic and atraumatic.  Right Ear: External ear normal.  Left Ear: External ear normal.  Eyes: Conjunctivae and EOM are normal. Pupils are equal, round, and reactive to light.  Neck: No thyromegaly present.  Cardiovascular: Normal rate.  Exam reveals no gallop and no friction rub.   No murmur heard. Pulmonary/Chest: Effort normal and breath sounds normal. No respiratory distress. She has no wheezes.  Neurological: She is alert and oriented to person, place, and time.  Skin: She is not diaphoretic.  Psychiatric: She has a normal mood and affect. Her behavior is normal.   EKG unremarkable at this time.  Assessment and Plan: Wanda Nixon is a 25 y.o. female who is here today for chief complaint of dizziness and anxiety. Patient's pulse lies around the 90s to 100s.  I will evaluate her for thyroid disease. But her symptoms occur with high stressors. Propanolol may help to resolve some fighter flight type of symptoms. I will start her on a low-dose of 60 mg extended release. I'm also advising that she start an SSRI at this time. Have given her alarming symptoms that would warrant discontinuing the medication and contacting me immediately. We will start a low dose of 25 mg per week increasing by 25 to a maximum of 100 mg was suggested. We will follow-up in 4-6 weeks. Also advised her to use a low dose of the hydroxyzine (she has  tried 25 mg and reported this made her feel extremely sleepy).  I am advising that she try half of this amount. Warned of the possible addictive qualities of continuing a benzodiazepine especially with multiple episodes of her anxiety like symptoms daily.  Anxiety state - Plan: POCT CBC, TSH, EKG [id number], Basic metabolic panel, POCT glycosylated hemoglobin (Hb A1C), CANCELED: POCT glucose (manual entry), CANCELED: Hemoglobin A1c  Ivar Drape, PA-C [facility] 9/27/20177:50 PM

## 2016-01-27 NOTE — Telephone Encounter (Signed)
Fax sent at this time.  Please advise her of this.  I would like to make sure that this is not just her present symptoms.   She should try the zoloft a second time, and check her pulse, as we discussed and did yesterday.   Has she taken any of the propranol?  If so, when? How long was it after she took the zoloft that her symptoms started?

## 2016-01-27 NOTE — Telephone Encounter (Signed)
PATIENT STATES SHE SAW STEPHANIE ENGLISH Tuesday FOR ANXIETY. SHE PUT HER ON A NEW MEDICATION CALLED ZOLOFT 25 MG. SHE TOOK 1/2 PILL AND IT CAUSED HER TO FEEL SOME DIZZINESS. SHE WOULD LIKE TO GET A NOTE FOR WORK. SHE WAS OUT ON TUES. 01/26/2016 AND WED. 01/27/2016 AND WILL TRY TO RETURN ON THURS. 01/28/2016. PLEASE FAX THE NOTE TO HER EMPLOYER. ALSO, CALL HER WHEN IT HAS BEEN DONE. PATIENT'S BEST PHONE [phone] (CELL) FAX PHONE [phone] - [place] ATTENTION OF KASEY ANDERSON (PROPERTY MANAGER)  [place]

## 2016-02-01 NOTE — Telephone Encounter (Signed)
Left message to call back  

## 2016-02-08 NOTE — Telephone Encounter (Signed)
Called pt and she reported that she had tried to [facility] but was put on hold for quite a while waiting for a nurse to be put on the line and she had to get back to work. Pt reported that she tried the 1/2 tab for about a week and SEs did get better, but she still felt that she couldn't focus as well so she stopped it. She has not p/up the propanol yet, was waiting on next pay check. She will pick up and see if that helps, and has also tried controlling her breathing to try to relax when anxious and it has helped. Advised pt she should RTC to discuss trying a different SSRI that may not cause her SEs and she agreed. She also stated that her asthma has been worse since visit and wants to discuss that also. Advised her that she should try OTC Zyrtec to try to control the allergies she has to mold which is bad this time of year and may contribute to increased problem with asthma. Pt agreed.

## 2016-02-12 ENCOUNTER — Ambulatory Visit (INDEPENDENT_AMBULATORY_CARE_PROVIDER_SITE_OTHER): Payer: 59 | Admitting: Physician Assistant

## 2016-02-12 VITALS — BP 110/64 | HR 95 | Temp 98.8°F | Resp 18 | Ht 59.0 in | Wt 137.6 lb

## 2016-02-12 DIAGNOSIS — F411 Generalized anxiety disorder: Secondary | ICD-10-CM

## 2016-02-12 DIAGNOSIS — F418 Other specified anxiety disorders: Secondary | ICD-10-CM | POA: Insufficient documentation

## 2016-02-12 MED ORDER — LORAZEPAM 1 MG PO TABS: 0.5000 mg | ORAL_TABLET | Freq: Two times a day (BID) | ORAL | 0 refills | Status: DC | PRN

## 2016-02-12 MED ORDER — CITALOPRAM HYDROBROMIDE 20 MG PO TABS: 20.0000 mg | ORAL_TABLET | Freq: Every day | ORAL | 3 refills | Status: DC

## 2016-02-12 NOTE — Patient Instructions (Signed)
     IF you received an x-ray today, you will receive an invoice from [facility]. Please contact [facility] at [phone] with questions or concerns regarding your invoice.   IF you received labwork today, you will receive an invoice from [facility]. Please contact [facility] at [phone] with questions or concerns regarding your invoice.   Our billing staff will not be able to assist you with questions regarding bills from these companies.  You will be contacted with the lab results as soon as they are available. The fastest way to get your results is to activate your My Chart account. Instructions are located on the last page of this paperwork. If you have not heard from us regarding the results in 2 weeks, please contact this office.      

## 2016-02-12 NOTE — Progress Notes (Signed)
Patient ID: Wanda Nixon, female    DOB: 11-28-1990, 25 y.o.   MRN: [id number]  PCP: Ivar Drape, PA  Subjective:   Chief Complaint  Patient presents with  . Dizziness    after taking Excedrin, vomiting/nausea also after Excedrin  . Headache    woke up with miagraine   . Medication Problem    Concern that thses sxs could be from Zoloft    HPI Presents for evaluation of possible migraine headache.  She awoke this morning feeling really awful. She has a headache, feels irritable and anxious. She's diaphoretic. She took a dose of Excedrin and developed nausea/vomiting and dizziness.  She was seen here 3 weeks ago with similar symptoms, and prescribed propranolol and sertraline. She initially took both but then stopped the sertraline because she thought it might be contributing her her symptoms.   Stress at work, at home. At work, they are frustrated with her because when she starts to feel bad, she has to go outside to get some fresh air. She has 2 children under 60 years old. She became pregnant with her second child one month following the birth of her first child. Younger child was born in March 2017. She is "so tired." Not sleeping well the past 3 nights, and her younger child has had a URI and ear infection.   Review of Systems As above.    Patient Active Problem List   Diagnosis Date Noted  . Anxiety state 02/12/2016  . Migraine, unspecified, without mention of intractable migraine without mention of status migrainosus 10/25/2013     Prior to Admission medications   Medication Sig Start Date End Date Taking? Authorizing Provider  Acetaminophen-Caff-Pyrilamine (MIDOL COMPLETE PO) Take 2 tablets by mouth 2 (two) times daily as needed (for pain).   Yes Historical Provider, MD  albuterol (PROVENTIL HFA;VENTOLIN HFA) 108 (90 BASE) MCG/ACT inhaler Inhale 1-2 puffs into the lungs every 6 (six) hours as needed for wheezing or shortness of breath.   Yes Historical  Provider, MD  propranolol ER (INDERAL LA) 60 MG 24 hr capsule Take 1 capsule (60 mg total) by mouth daily. 01/26/16  Yes Dorian Heckle English, PA     Allergies  Allergen Reactions  . Pulmicort [Budesonide] Palpitations    Racing heart  . Tomato Hives and Itching  . Other Hives    mushrooms       Objective:  Physical Exam  Constitutional: She is oriented to person, place, and time. She appears well-developed and well-nourished. She is active and cooperative. She appears distressed (diaphoretic, unable to sit still in the exam room, ultimately asked if she could go outside to get some air).  BP 110/64 (BP Location: Right Arm, Patient Position: Sitting, Cuff Size: Normal)   Pulse 95   Temp 98.8 F (37.1 C) (Oral)   Resp 18   Ht 4\' 11"  (1.499 m)   Wt 137 lb 9.6 oz (62.4 kg)   LMP 02/12/2016   SpO2 100%   BMI 27.79 kg/m   HENT:  Head: Normocephalic and atraumatic.  Right Ear: Hearing normal.  Left Ear: Hearing normal.  Eyes: Conjunctivae are normal. No scleral icterus.  Neck: Normal range of motion. Neck supple. No thyromegaly present.  Cardiovascular: Normal rate, regular rhythm and normal heart sounds.   Pulses:      Radial pulses are 2+ on the right side, and 2+ on the left side.  Pulmonary/Chest: Effort normal and breath sounds normal.  Lymphadenopathy:  Head (right side): No tonsillar, no preauricular, no posterior auricular and no occipital adenopathy present.       Head (left side): No tonsillar, no preauricular, no posterior auricular and no occipital adenopathy present.    She has no cervical adenopathy.       Right: No supraclavicular adenopathy present.       Left: No supraclavicular adenopathy present.  Neurological: She is alert and oriented to person, place, and time. No sensory deficit.  Skin: Skin is warm and intact. No rash noted. She is diaphoretic (initially, but resolved). No cyanosis or erythema. Nails show no clubbing.  Psychiatric: Her speech is  normal. Judgment and thought content normal. Her mood appears anxious. Her affect is not angry, not blunt, not labile and not inappropriate. She is agitated. She is not aggressive, not hyperactive, not slowed, not withdrawn, not actively hallucinating and not combative. Cognition and memory are normal. She does not exhibit a depressed mood. She is attentive.   Labs performed at her visit last month reviewed-all normal. Results for orders placed or performed in visit on 01/26/16  TSH  Result Value Ref Range   TSH 3.17 mIU/L  Basic metabolic panel  Result Value Ref Range   Sodium 140 135 - 146 mmol/L   Potassium 4.4 3.5 - 5.3 mmol/L   Chloride 105 98 - 110 mmol/L   CO2 26 20 - 31 mmol/L   Glucose, Bld 81 65 - 99 mg/dL   BUN 13 7 - 25 mg/dL   Creat 0.68 0.50 - 1.10 mg/dL   Calcium 9.7 8.6 - 10.2 mg/dL  POCT CBC  Result Value Ref Range   WBC 7.0 4.6 - 10.2 K/uL   Lymph, poc 1.4 0.6 - 3.4   POC LYMPH PERCENT 20.3 10 - 50 %L   MID (cbc) 0.3 0 - 0.9   POC MID % 3.8 0 - 12 %M   POC Granulocyte 5.3 2 - 6.9   Granulocyte percent 75.9 37 - 80 %G   RBC 4.69 4.04 - 5.48 M/uL   Hemoglobin 13.0 12.2 - 16.2 g/dL   HCT, POC 37.9 37.7 - 47.9 %   MCV 80.8 80 - 97 fL   MCH, POC 27.8 27 - 31.2 pg   MCHC 34.4 31.8 - 35.4 g/dL   RDW, POC 15.2 %   Platelet Count, POC 353 142 - 424 K/uL   MPV 7.8 0 - 99.8 fL  POCT glycosylated hemoglobin (Hb A1C)  Result Value Ref Range   Hemoglobin A1C 5.5            Assessment & Plan:   1. Anxiety state Try a different SSRI. Continue propranolol. Lorazepam PRN. RTC for re-evaluation in 4 weeks, sooner if needed. - citalopram (CELEXA) 20 MG tablet; Take 1 tablet (20 mg total) by mouth daily.  Dispense: 30 tablet; Refill: 3 - LORazepam (ATIVAN) 1 MG tablet; Take 0.5-1 tablets (0.5-1 mg total) by mouth 2 (two) times daily as needed for anxiety.  Dispense: 20 tablet; Refill: 0   Fara Chute, PA-C Physician Assistant-Certified [facility]

## 2016-02-15 ENCOUNTER — Telehealth: Payer: Self-pay

## 2016-02-15 NOTE — Telephone Encounter (Signed)
PATIENT IS REQUESTING A WORK NOTE BE FAXED TO HER EMPLOYER. SHE WAS SEEN BY CHELLE ON Friday AND WAS GIVEN ONE, BUT SHE CAN NOT FIND IT. SHE WAS OUT ON Friday 02/12/2016 AND SHE RETURNED TODAY Monday 02/15/2016. PLEASE CALL HER WHEN IT HAS BEEN DONE.  BEST PHONE [phone]  FAX TO [facility] AT [phone] TO THE ATTENTION OF: TAYLOR BERRY (ASST. MANAGER) [name]

## 2016-02-18 ENCOUNTER — Telehealth: Payer: Self-pay | Admitting: Emergency Medicine

## 2016-02-18 NOTE — Telephone Encounter (Signed)
Pt received return to work note 3 days ago

## 2016-02-22 ENCOUNTER — Ambulatory Visit: Payer: 59

## 2016-02-23 ENCOUNTER — Other Ambulatory Visit: Payer: Self-pay | Admitting: Physician Assistant

## 2016-02-23 DIAGNOSIS — F411 Generalized anxiety disorder: Secondary | ICD-10-CM

## 2016-02-24 ENCOUNTER — Telehealth: Payer: Self-pay | Admitting: Emergency Medicine

## 2016-02-24 ENCOUNTER — Other Ambulatory Visit: Payer: Self-pay | Admitting: Physician Assistant

## 2016-02-24 ENCOUNTER — Telehealth: Payer: Self-pay

## 2016-02-24 DIAGNOSIS — F411 Generalized anxiety disorder: Secondary | ICD-10-CM

## 2016-02-24 NOTE — Telephone Encounter (Signed)
duplicate

## 2016-02-24 NOTE — Telephone Encounter (Signed)
Meds ordered this encounter  Medications  . LORazepam (ATIVAN) 1 MG tablet    Sig: TAKE 1/2 TO 1 TABLET BY MOUTH 2 TIMES DAILY AS NEEDED FOR ANXIETY    Dispense:  20 tablet    Refill:  0    Not to exceed 5 additional fills before 04/11/2018PT SAYS SHE NEEDS A REFILL.

## 2016-02-24 NOTE — Telephone Encounter (Signed)
Pt called to check on this because pharm told her it was denied. Had operator advise her that it was not denied, it was approved and I will call it into pharm now. Done.

## 2016-02-24 NOTE — Telephone Encounter (Signed)
Voice mail left for pt to pick up prescription 

## 2016-02-24 NOTE — Telephone Encounter (Signed)
Spoke with patient she states she only has a few Ativan left. She is aware she needs to come in and be seen within 4 weeks.

## 2016-02-25 ENCOUNTER — Telehealth: Payer: Self-pay

## 2016-02-25 NOTE — Telephone Encounter (Signed)
Patient wants to talk with Chelle regarding her side effects.   [phone]

## 2016-02-26 ENCOUNTER — Ambulatory Visit (INDEPENDENT_AMBULATORY_CARE_PROVIDER_SITE_OTHER): Payer: 59 | Admitting: Family Medicine

## 2016-02-26 DIAGNOSIS — J45909 Unspecified asthma, uncomplicated: Secondary | ICD-10-CM

## 2016-02-26 DIAGNOSIS — F411 Generalized anxiety disorder: Secondary | ICD-10-CM | POA: Diagnosis not present

## 2016-02-26 DIAGNOSIS — O99519 Diseases of the respiratory system complicating pregnancy, unspecified trimester: Secondary | ICD-10-CM | POA: Diagnosis not present

## 2016-02-26 MED ORDER — LORAZEPAM 1 MG PO TABS: ORAL_TABLET | ORAL | 0 refills | Status: DC

## 2016-02-26 MED ORDER — PREDNISONE 10 MG PO TABS
30.0000 mg | ORAL_TABLET | Freq: Every day | ORAL | 0 refills | Status: DC
Start: 2016-02-26 — End: 2016-03-15

## 2016-02-26 MED ORDER — ALBUTEROL SULFATE HFA 108 (90 BASE) MCG/ACT IN AERS: 1.0000 | INHALATION_SPRAY | Freq: Four times a day (QID) | RESPIRATORY_TRACT | 1 refills | Status: DC | PRN

## 2016-02-26 NOTE — Patient Instructions (Addendum)
Thank you for coming in today. Take 1/2 tablet of Celexa  (10mg ) for 1 week. Then increase to 20 mg (1 full tablet).  Use lorazepam sparingly if able.  Take prednisone 3 pills daily for 5 days.  Continue albuterol as needed.  Follow up with Colletta [place] as needed.    Call or go to the emergency room if you get worse, have trouble breathing, have chest pains, or palpitations.    IF you received an x-ray today, you will receive an invoice from [facility]. Please contact [facility] at [phone] with questions or concerns regarding your invoice.   IF you received labwork today, you will receive an invoice from [employer]. Please contact [facility] at [phone] with questions or concerns regarding your invoice.   Our billing staff will not be able to assist you with questions regarding bills from these companies.  You will be contacted with the lab results as soon as they are available. The fastest way to get your results is to activate your My Chart account. Instructions are located on the last page of this paperwork. If you have not heard from [place] regarding the results in 2 weeks, please contact this office.    ,

## 2016-02-26 NOTE — Progress Notes (Signed)
Wanda Nixon is a 25 y.o. female who presents to [facility] today for anxiety and shortness of breath.  Shortness of breath: Patient has a history of asthma and over the last several days she's noted mild increased shortness of breath without significant wheezing. She notes she's been having use her albuterol inhaler much more frequently. She denies fevers chills cough or congestion.  Additionally she notes increased anxiety. She has been seen recently for anxiety disorder with lorazepam for temporary use and citalopram for long-term use. She is very anxious about starting citalopram concerned about safety. She started taking citalopram about 3 days ago at 20 mg. She notes her breathing symptoms started around about the same time she started taking citalopram. She notes that she is nearly out of the lorazepam and needs a refill.   Past Medical History:  Diagnosis Date  . Asthma    Last used 08/30/14  . Cholestasis of pregnancy in third trimester 07/25/2015  . Eczema   . Headache(784.0)   . NST (non-stress test) nonreactive   . SVD (spontaneous vaginal delivery) 07/26/2015   Past Surgical History:  Procedure Laterality Date  . MOUTH SURGERY    . TYMPANOSTOMY TUBE PLACEMENT    . WISDOM TOOTH EXTRACTION     Social History  Substance Use Topics  . Smoking status: Never Smoker  . Smokeless tobacco: Never Used  . Alcohol use No   ROS as above Medications: Current Outpatient Prescriptions  Medication Sig Dispense Refill  . Acetaminophen-Caff-Pyrilamine (MIDOL COMPLETE PO) Take 2 tablets by mouth 2 (two) times daily as needed (for pain).    Marland Kitchen albuterol (PROVENTIL HFA;VENTOLIN HFA) 108 (90 Base) MCG/ACT inhaler Inhale 1-2 puffs into the lungs every 6 (six) hours as needed for wheezing or shortness of breath. 1 Inhaler 1  . citalopram (CELEXA) 20 MG tablet Take 1 tablet (20 mg total) by mouth daily. 30 tablet 3  . LORazepam (ATIVAN) 1 MG tablet Take up 1/2 to 1 tablet up to 2x daily as  needed for anxiety. 20 tablet 0  . predniSONE (DELTASONE) 10 MG tablet Take 3 tablets (30 mg total) by mouth daily with breakfast. 15 tablet 0   No current facility-administered medications for this visit.    Allergies  Allergen Reactions  . Pulmicort [Budesonide] Palpitations    Racing heart  . Tomato Hives and Itching  . Other Hives    mushrooms     Exam:  BP 122/72 (BP Location: Right Arm, Patient Position: Sitting, Cuff Size: Normal)   Pulse 95   Temp 98.5 F (36.9 C) (Oral)   Resp 17   Ht 4\' 11"  (1.499 m)   Wt 137 lb (62.1 kg)   LMP 02/12/2016   SpO2 100%   BMI 27.67 kg/m  Gen: Well NAD Anxious-appearing woman HEENT: EOMI,  MMM Lungs: Normal work of breathing. CTABL Heart: RRR no MRG Abd: NABS, Soft. Nondistended, Nontender Exts: Brisk capillary refill, warm and well perfused.   Peak flow meters range on average at 360. For her height and age this should be 52  No results found for this or any previous visit (from the past 24 hour(s)). No results found.  Assessment and Plan: 25 y.o. female with  Shortness of breath: This is obviously a bit complicated and difficult to figure out. Her shortness of breath symptoms may simply be anxiety however she does have a history of asthma. Her peak flow is depressed and I do think she truly has mild asthma or reactive  airway disease exacerbation. I will treat with prednisone and albuterol. Follow-up with PCP in the near future.  Anxiety: Slightly worse. Patient may be experiencing side effects from citalopram. I recommend decreasing the dose to 10 mg daily (one half of her current dose) for one week then increasing to 20 mg. Refill lorazepam and follow-up with PCP.  Discussed warning signs or symptoms. Please see discharge instructions. Patient expresses understanding.

## 2016-02-26 NOTE — Telephone Encounter (Signed)
I spoke with pt. Her sideeffects are trouble breathing, using her inhaler more. She said that the breathing was not terrible more uncomfortable she is concerned. She is coming in today to be checked.she said that this started when she started taking lorazepam. thanks

## 2016-02-29 NOTE — Telephone Encounter (Signed)
Patient was seen 10/27

## 2016-03-08 ENCOUNTER — Other Ambulatory Visit: Payer: Self-pay

## 2016-03-08 DIAGNOSIS — F411 Generalized anxiety disorder: Secondary | ICD-10-CM

## 2016-03-08 NOTE — Telephone Encounter (Signed)
Pt would like her LORazepam (ATIVAN) 1 MG tablet sent over to [facility] on [address]. She is at work and can't make it to  [id number] [place] [place] - [place]. CB [phone]

## 2016-03-09 ENCOUNTER — Telehealth: Payer: Self-pay

## 2016-03-09 NOTE — Telephone Encounter (Signed)
She will need to return to discuss refills of this medication. Philis Fendt, MS, PA-C 3:37 PM, 03/09/2016

## 2016-03-09 NOTE — Telephone Encounter (Signed)
Spoke with patient and gave her Michael's message.  She said her insurance is cancelling the 18th and she will try to get in before then.

## 2016-03-09 NOTE — Telephone Encounter (Signed)
Patient is requesting a refill of ativan.  Patient states it worked really well.  She also said the pharmacy did not fill her albuterol either.  Patient will call them to double check when they open to make sure it's not there.  Please advise  [phone]

## 2016-03-09 NOTE — Telephone Encounter (Signed)
Patient seen on 02/26/2016.  Please advise.Marland KitchenMarland Kitchen

## 2016-03-10 NOTE — Telephone Encounter (Signed)
Last visit and refill 02/26/16

## 2016-03-11 MED ORDER — LORAZEPAM 1 MG PO TABS: ORAL_TABLET | ORAL | 0 refills | Status: DC

## 2016-03-11 NOTE — Telephone Encounter (Signed)
Meds ordered this encounter  Medications  . LORazepam (ATIVAN) 1 MG tablet    Sig: Take up 1/2 to 1 tablet up to 2x daily as needed for anxiety.    Dispense:  20 tablet    Refill:  0    Please advise patient to come in to be seen before she runs out of this Rx.

## 2016-03-11 NOTE — Telephone Encounter (Signed)
[facility] to  [id number] [place], [place]. [phone] (Phone) [phone] (Fax)   Per pt.  Advised pt to return prior to needing refill.

## 2016-03-15 ENCOUNTER — Ambulatory Visit (INDEPENDENT_AMBULATORY_CARE_PROVIDER_SITE_OTHER): Payer: 59 | Admitting: Family Medicine

## 2016-03-15 VITALS — BP 126/78 | HR 94 | Temp 98.8°F | Resp 17 | Ht 59.0 in | Wt 134.0 lb

## 2016-03-15 DIAGNOSIS — J069 Acute upper respiratory infection, unspecified: Secondary | ICD-10-CM | POA: Diagnosis not present

## 2016-03-15 DIAGNOSIS — T50995A Adverse effect of other drugs, medicaments and biological substances, initial encounter: Secondary | ICD-10-CM

## 2016-03-15 DIAGNOSIS — F411 Generalized anxiety disorder: Secondary | ICD-10-CM

## 2016-03-15 DIAGNOSIS — T43205A Adverse effect of unspecified antidepressants, initial encounter: Secondary | ICD-10-CM

## 2016-03-15 NOTE — Patient Instructions (Addendum)
You appear to have an upper respiratory infection that is due to a virus. I do not hear any wheezing on exam today, but you can continue to use albuterol up to every 4 hours as needed for wheezing or shortness of breath. If you have to use her inhaler more than 2-3 times per day, or more than 3 days in a row, return for recheck. If wheezing/shortness of breath returns prior 4 hours, be seen here or in emergency room. Your ear pressure is likely due to congestion, I do not see any sign of infection on your exam today.   You can try the Mucinex samples given today for cough, but if does make you wheeze more, or no improvement with that medicine, please stop it. Make sure you drink plenty of fluids, salt water nasal spray as needed for congestion, and rest as discussed.   Some of your symptoms are likely due to worsening anxiety as well as coming off of the Celexa and some serotonin withdrawal. Restart Celexa today, I did give you some numbers below for counselors that may help with dealing with anxiety as well as recent stressors. Follow-up up with primary provider in the next 3-4 weeks to discuss these symptoms further, sooner if worse.  Vivia Budge: [phone] Lyndee Leo Huprich: [phone]   Generalized Anxiety Disorder Generalized anxiety disorder (GAD) is a mental disorder. It interferes with life functions, including relationships, work, and school. GAD is different from normal anxiety, which everyone experiences at some point in their lives in response to specific life events and activities. Normal anxiety actually helps [place] prepare for and get through these life events and activities. Normal anxiety goes away after the event or activity is over.  GAD causes anxiety that is not necessarily related to specific events or activities. It also causes excess anxiety in proportion to specific events or activities. The anxiety associated with GAD is also difficult to control. GAD can vary from mild to severe.  People with severe GAD can have intense waves of anxiety with physical symptoms (panic attacks).  SYMPTOMS The anxiety and worry associated with GAD are difficult to control. This anxiety and worry are related to many life events and activities and also occur more days than not for 6 months or longer. People with GAD also have three or more of the following symptoms (one or more in children):  Restlessness.   Fatigue.  Difficulty concentrating.   Irritability.  Muscle tension.  Difficulty sleeping or unsatisfying sleep. DIAGNOSIS GAD is diagnosed through an assessment by your health care provider. Your health care provider will ask you questions aboutyour mood,physical symptoms, and events in your life. Your health care provider may ask you about your medical history and use of alcohol or drugs, including prescription medicines. Your health care provider may also do a physical exam and blood tests. Certain medical conditions and the use of certain substances can cause symptoms similar to those associated with GAD. Your health care provider may refer you to a mental health specialist for further evaluation. TREATMENT The following therapies are usually used to treat GAD:   Medication. Antidepressant medication usually is prescribed for long-term daily control. Antianxiety medicines may be added in severe cases, especially when panic attacks occur.   Talk therapy (psychotherapy). Certain types of talk therapy can be helpful in treating GAD by providing support, education, and guidance. A form of talk therapy called cognitive behavioral therapy can teach you healthy ways to think about and react to daily life  events and activities.  Stress managementtechniques. These include yoga, meditation, and exercise and can be very helpful when they are practiced regularly. A mental health specialist can help determine which treatment is best for you. Some people see improvement with one therapy.  However, other people require a combination of therapies. This information is not intended to replace advice given to you by your health care provider. Make sure you discuss any questions you have with your health care provider. Document Released: 08/13/2012 Document Revised: 05/09/2014 Document Reviewed: 08/13/2012 Elsevier Interactive Patient Education  2017 [place]  Upper Respiratory Infection, Adult Most upper respiratory infections (URIs) are a viral infection of the air passages leading to the lungs. A URI affects the nose, throat, and upper air passages. The most common type of URI is nasopharyngitis and is typically referred to as "the common cold." URIs run their course and usually go away on their own. Most of the time, a URI does not require medical attention, but sometimes a bacterial infection in the upper airways can follow a viral infection. This is called a secondary infection. Sinus and middle ear infections are common types of secondary upper respiratory infections. Bacterial pneumonia can also complicate a URI. A URI can worsen asthma and chronic obstructive pulmonary disease (COPD). Sometimes, these complications can require emergency medical care and may be life threatening. What are the causes? Almost all URIs are caused by viruses. A virus is a type of germ and can spread from one person to another. What increases the risk? You may be at risk for a URI if:  You smoke.  You have chronic heart or lung disease.  You have a weakened defense (immune) system.  You are very young or very old.  You have nasal allergies or asthma.  You work in crowded or poorly ventilated areas.  You work in health care facilities or schools. What are the signs or symptoms? Symptoms typically develop 2-3 days after you come in contact with a cold virus. Most viral URIs last 7-10 days. However, viral URIs from the influenza virus (flu virus) can last 14-18 days and are typically more  severe. Symptoms may include:  Runny or stuffy (congested) nose.  Sneezing.  Cough.  Sore throat.  Headache.  Fatigue.  Fever.  Loss of appetite.  Pain in your forehead, behind your eyes, and over your cheekbones (sinus pain).  Muscle aches. How is this diagnosed? Your health care provider may diagnose a URI by:  Physical exam.  Tests to check that your symptoms are not due to another condition such as:  Strep throat.  Sinusitis.  Pneumonia.  Asthma. How is this treated? A URI goes away on its own with time. It cannot be cured with medicines, but medicines may be prescribed or recommended to relieve symptoms. Medicines may help:  Reduce your fever.  Reduce your cough.  Relieve nasal congestion. Follow these instructions at home:  Take medicines only as directed by your health care provider.  Gargle warm saltwater or take cough drops to comfort your throat as directed by your health care provider.  Use a warm mist humidifier or inhale steam from a shower to increase air moisture. This may make it easier to breathe.  Drink enough fluid to keep your urine clear or pale yellow.  Eat soups and other clear broths and maintain good nutrition.  Rest as needed.  Return to work when your temperature has returned to normal or as your health care provider advises. You may  need to stay home longer to avoid infecting others. You can also use a face mask and careful hand washing to prevent spread of the virus.  Increase the usage of your inhaler if you have asthma.  Do not use any tobacco products, including cigarettes, chewing tobacco, or electronic cigarettes. If you need help quitting, ask your health care provider. How is this prevented? The best way to protect yourself from getting a cold is to practice good hygiene.  Avoid oral or hand contact with people with cold symptoms.  Wash your hands often if contact occurs. There is no clear evidence that vitamin C,  vitamin E, echinacea, or exercise reduces the chance of developing a cold. However, it is always recommended to get plenty of rest, exercise, and practice good nutrition. Contact a health care provider if:  You are getting worse rather than better.  Your symptoms are not controlled by medicine.  You have chills.  You have worsening shortness of breath.  You have brown or red mucus.  You have yellow or brown nasal discharge.  You have pain in your face, especially when you bend forward.  You have a fever.  You have swollen neck glands.  You have pain while swallowing.  You have white areas in the back of your throat. Get help right away if:  You have severe or persistent:  Headache.  Ear pain.  Sinus pain.  Chest pain.  You have chronic lung disease and any of the following:  Wheezing.  Prolonged cough.  Coughing up blood.  A change in your usual mucus.  You have a stiff neck.  You have changes in your:  Vision.  Hearing.  Thinking.  Mood. This information is not intended to replace advice given to you by your health care provider. Make sure you discuss any questions you have with your health care provider. Document Released: 10/12/2000 Document Revised: 12/20/2015 Document Reviewed: 07/24/2013 Elsevier Interactive Patient Education  2017 [employer].    IF you received an x-ray today, you will receive an invoice from [facility]. Please contact [facility] at [phone] with questions or concerns regarding your invoice.   IF you received labwork today, you will receive an invoice from [employer]. Please contact [facility] at [phone] with questions or concerns regarding your invoice.   Our billing staff will not be able to assist you with questions regarding bills from these companies.  You will be contacted with the lab results as soon as they are available. The fastest way to get your results is  to activate your My Chart account. Instructions are located on the last page of this paperwork. If you have not heard from [place] regarding the results in 2 weeks, please contact this office.

## 2016-03-15 NOTE — Progress Notes (Signed)
By signing my name below, I, Wanda Nixon, attest that this documentation has been prepared under the direction and in the presence of Wanda Ray, MD.  Electronically Signed: Verlee Nixon, Medical Scribe. 03/15/16. 11:17 AM.  Subjective:    Patient ID: Wanda Nixon, female    DOB: [birth year], 25 y.o.   MRN: [id number]  HPI Chief Complaint  Patient presents with  . URI  . Cough  . Nasal Congestion    HPI Comments: Wanda Nixon is a 25 y.o. female with a PMHx of asthma, anxiety, who presents to the [facility] complaining of cough 6 days ago. Her symptoms started with congestion and was followed with "clogged" ears, dizziness, subjective fever yesterday, loss of sleep for the past 4 nights, trouble breathing, and she currently feels hot. Pt is currently on her menses and mentions every time she has her menses she feels dizzy. She takes albuterol PRN for her allergies and uses albuterol 2x a day when she has a cold or when she's having a panic attack; otherwise, she doesn't have to use it when she is'nt sick. Took theraflu for relief of her symptoms and mucinex without relief to her symptoms. She stopped taking celexa 3 days ago so she wouldn't mix her cold medications with it. Reports 2 sick contacts from her 24 y/o and 7 m/o. She mentions she hasn't eaten today.  Anxiety: Pt lost a close friend 5 days ago from unknown health complications and she's felt really emotional from the loss. She reports it's been giving her anxiety since they were the same age as her and she's nervous about having a health complications that could lead to death. Pt has a Product manager that she can talk to and states her aunt has been helping her take care of her sick kids. Denies PMHx of DM.   Patient Active Problem List   Diagnosis Date Noted  . Asthma 02/26/2016  . Anxiety state 02/12/2016  . Migraine, unspecified, without mention of intractable migraine without mention of status  migrainosus 10/25/2013   Past Medical History:  Diagnosis Date  . Asthma    Last used 08/30/14  . Cholestasis of pregnancy in third trimester 07/25/2015  . Eczema   . Headache(784.0)   . NST (non-stress test) nonreactive   . SVD (spontaneous vaginal delivery) 07/26/2015   Past Surgical History:  Procedure Laterality Date  . MOUTH SURGERY    . TYMPANOSTOMY TUBE PLACEMENT    . WISDOM TOOTH EXTRACTION     Allergies  Allergen Reactions  . Pulmicort [Budesonide] Palpitations    Racing heart  . Tomato Hives and Itching  . Other Hives    mushrooms   Prior to Admission medications   Medication Sig Start Date End Date Taking? Authorizing Provider  Acetaminophen-Caff-Pyrilamine (MIDOL COMPLETE PO) Take 2 tablets by mouth 2 (two) times daily as needed (for pain).   Yes Historical Provider, MD  albuterol (PROVENTIL HFA;VENTOLIN HFA) 108 (90 Base) MCG/ACT inhaler Inhale 1-2 puffs into the lungs every 6 (six) hours as needed for wheezing or shortness of breath. 02/26/16  Yes Gregor Hams, MD  citalopram (CELEXA) 20 MG tablet Take 1 tablet (20 mg total) by mouth daily. 02/12/16  Yes Chelle Jeffery, PA-C  LORazepam (ATIVAN) 1 MG tablet Take up 1/2 to 1 tablet up to 2x daily as needed for anxiety. 03/11/16  Yes Harrison Mons, PA-C   Social History   Social History  . Marital status: Married    Spouse name:  Atreyo  . Number of children: 2  . Years of education: college   Occupational History  . 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 Smoking status: Never Smoker  . Smokeless tobacco: Never Used  . Alcohol use No  . Drug use: No  . Sexual activity: Yes    Birth control/ protection: None   Other Topics Concern  . Not on file   Social History Narrative   Patient lives at home with her husband and their 2 children.    Education two years of college.   Right handed.   Caffeine - None    Review of Systems  Constitutional: Positive for fever.  HENT: Positive for  congestion.   Respiratory: Positive for cough.   Endocrine: Positive for heat intolerance.  Neurological: Positive for dizziness.  Psychiatric/Behavioral: Positive for sleep disturbance. The patient is nervous/anxious.    Objective:  Physical Exam  Constitutional: She is oriented to person, place, and time. She appears well-developed and well-nourished. No distress.  HENT:  Head: Normocephalic and atraumatic.  Right Ear: Hearing, external ear and ear canal normal. Tympanic membrane is not erythematous and not retracted.  Left Ear: Hearing, tympanic membrane, external ear and ear canal normal.  Nose: Nose normal.  Mouth/Throat: Oropharynx is clear and moist. No oropharyngeal exudate.  Minimal clear fluid behind the right TM  Eyes: Conjunctivae and EOM are normal. Pupils are equal, round, and reactive to light.  Neck: Neck supple.  Cardiovascular: Normal rate, regular rhythm, normal heart sounds and intact distal pulses.  Exam reveals no friction rub.   No murmur heard. Pulmonary/Chest: Effort normal and breath sounds normal. No respiratory distress. She has no wheezes. She has no rhonchi. She has no rales.  Lymphadenopathy:    She has no cervical adenopathy.  Neurological: She is alert and oriented to person, place, and time.  Skin: Skin is warm and dry. No rash noted.  Psychiatric: She has a normal mood and affect. Her behavior is normal.  Nursing note and vitals reviewed.  BP 122/72 (BP Location: Right Arm, Patient Position: Sitting, Cuff Size: Normal)   Pulse 94   Temp 98.8 F (37.1 C) (Oral)   Resp 17   Ht 4\' 11"  (1.499 m)   Wt 134 lb (60.8 kg)   LMP 03/15/2016 (Approximate)   SpO2 94%   BMI 27.06 kg/m    BP right arm while supine, regular cuff: 126/78  Orthostatic VS for the past 24 hrs:  BP- Lying Pulse- Lying BP- Sitting Pulse- Sitting BP- Standing at 0 minutes Pulse- Standing at 0 minutes  03/15/16 1150 111/76 84 (!) 132/93 87 124/82 87   Assessment & Plan:    [name] is a 25 y.o. female Acute upper respiratory infection  - Suspected viral illness, symptomatic care discussed. RTC precautions.  -History of asthma, but appears to be controlled when not with viral illness. Continued albuterol as needed, but if persistent or frequent use, return for recheck here or ER.  Anxiety state, serotonin withdrawal  - History of anxiety, now with some situational anxiety/grief and the situational stressors with sick kids at home likely magnifying symptoms. Additionally symptoms component of serotonin withdrawal and absence of SSRI may be contributing to above symptoms.  -Restart SSRI a previous dose, follow up in the next 3-4 weeks to see if this is the right dose.  -Start cognitive behavioral therapy/me with therapist, phone numbers provided.  - Advised to not stop SSRI without discussing first as  suspected component of serotonin withdrawal today.  Granola bar was given in office, water, and improved in dizziness symptoms. RTC precautions given.

## 2016-03-19 ENCOUNTER — Ambulatory Visit (INDEPENDENT_AMBULATORY_CARE_PROVIDER_SITE_OTHER): Payer: 59 | Admitting: Physician Assistant

## 2016-03-19 VITALS — BP 112/68 | HR 96 | Temp 98.2°F | Resp 16 | Ht 59.0 in | Wt 133.8 lb

## 2016-03-19 DIAGNOSIS — F411 Generalized anxiety disorder: Secondary | ICD-10-CM

## 2016-03-19 DIAGNOSIS — R42 Dizziness and giddiness: Secondary | ICD-10-CM

## 2016-03-19 NOTE — Progress Notes (Signed)
Subjective:    Patient ID: Wanda Nixon, female    DOB: 1990-11-25, 25 y.o.   MRN: [id number]  HPI: Presents for dizziness. Patient was seen here 4 days ago for an upper respiratory infection and given symptomatic treatment. States she has had continued congestion, cough, and ear fullness, but feels as if her respiratory symptoms have been improving. States she has used her Albuterol inhaler twice in the past few days.   She was started on Citalopram for her anxiety about a month ago. However, she stopped taking it 5 days ago because she was worried about how it would interact with the Theraflu she was taking for her respiratory symptoms. Started taking the Citalopram again 2 days ago. States her anxiety has been very high. Last night she felt lightheaded and stepped wrong on the steps at home, falling down 8 carpeted steps. States she fell mostly on her right flank, leg, and wrist, and also onto her left wrist. Denies hitting her head during the fall or LOC. Took Tylenol for the pain after the fall but has still has some continue muscle aching on her right side and bilateral wrists today. She states her dizziness is worse when laying down at night and she feels as if she is falling. This occurred last night, so she took half of a Lorazepam which provided relief and allowed her to fall asleep. Notes some associated "floaters" or "black dots" in her visual field associated with the dizziness. She notes she did have vertigo in July 2016 and was given Antivert which provided relief.  She notes a lot of increased stress at home which is causing her anxiety. She has two children under two years old, has been sick a lot recently, and is supposed to start a new job next week. She also has increased anxiety about having medical problems which would prevent her from being able to take care of her children/husband.   Review of Systems  Constitutional: Positive for fatigue. Negative for chills and fever.    HENT: Positive for congestion and postnasal drip. Negative for ear discharge, ear pain, sinus pain, sinus pressure, sore throat and tinnitus.   Eyes: Positive for visual disturbance. Negative for photophobia, pain, discharge, redness and itching.  Respiratory: Negative for cough, chest tightness, shortness of breath and wheezing.   Cardiovascular: Negative for chest pain and palpitations.  Gastrointestinal: Positive for nausea. Negative for abdominal pain, blood in stool, constipation, diarrhea and vomiting.  Genitourinary: Negative for dysuria, frequency, hematuria and urgency.  Musculoskeletal: Positive for myalgias.  Neurological: Positive for dizziness, weakness, light-headedness and headaches. Negative for syncope.  Psychiatric/Behavioral: The patient is nervous/anxious.    Allergies  Allergen Reactions  . Pulmicort [Budesonide] Palpitations    Racing heart  . Tomato Hives and Itching  . Other Hives    mushrooms   Prior to Admission medications   Medication Sig Start Date End Date Taking? Authorizing Provider  albuterol (PROVENTIL HFA;VENTOLIN HFA) 108 (90 Base) MCG/ACT inhaler Inhale 1-2 puffs into the lungs every 6 (six) hours as needed for wheezing or shortness of breath. 02/26/16  Yes Gregor Hams, MD  citalopram (CELEXA) 20 MG tablet Take 1 tablet (20 mg total) by mouth daily. 02/12/16  Yes Chelle Jeffery, PA-C  LORazepam (ATIVAN) 1 MG tablet Take up 1/2 to 1 tablet up to 2x daily as needed for anxiety. 03/11/16  Yes Chelle Jeffery, PA-C  Acetaminophen-Caff-Pyrilamine (MIDOL COMPLETE PO) Take 2 tablets by mouth 2 (two) times daily as needed (for  pain).    Historical Provider, MD   Patient Active Problem List   Diagnosis Date Noted  . Asthma 02/26/2016  . Anxiety state 02/12/2016  . Migraine, unspecified, without mention of intractable migraine without mention of status migrainosus 10/25/2013      Objective: Blood pressure 112/68, pulse 96, temperature 98.2 F (36.8 C),  temperature source Oral, resp. rate 16, height 4\' 11"  (1.499 m), weight 133 lb 12.8 oz (60.7 kg), last menstrual period 03/15/2016, SpO2 100 %, not currently breastfeeding.   Physical Exam  Constitutional: She is oriented to person, place, and time. She appears well-developed and well-nourished.  HENT:  Head: Normocephalic and atraumatic.  Right Ear: External ear normal. No drainage, swelling or tenderness. Tympanic membrane is not injected, not scarred, not perforated, not erythematous, not retracted and not bulging. No hemotympanum.  Left Ear: External ear normal. No drainage, swelling or tenderness. Tympanic membrane is not injected, not scarred, not perforated, not erythematous, not retracted and not bulging. No hemotympanum.  Nose: No mucosal edema, rhinorrhea, sinus tenderness, nasal deformity or septal deviation. No epistaxis.  Mouth/Throat: Uvula is midline, oropharynx is clear and moist and mucous membranes are normal. Mucous membranes are not pale, not dry and not cyanotic. No oral lesions. Normal dentition. No oropharyngeal exudate, posterior oropharyngeal edema, posterior oropharyngeal erythema or tonsillar abscesses.  Eyes: Conjunctivae are normal. Pupils are equal, round, and reactive to light. Right eye exhibits no discharge and no exudate. Left eye exhibits no discharge and no exudate. Right conjunctiva is not injected. Left conjunctiva is not injected. Right pupil is round and reactive. Left pupil is round and reactive. Pupils are equal.  Neck: Normal range of motion. Neck supple. No tracheal deviation present. No thyromegaly present.  Cardiovascular: Normal rate, regular rhythm, S1 normal, S2 normal and intact distal pulses.  Exam reveals no gallop and no friction rub.   No murmur heard. Pulses:      Radial pulses are 2+ on the right side, and 2+ on the left side.  Pulmonary/Chest: Effort normal and breath sounds normal. No accessory muscle usage or stridor. No respiratory distress.  She has no decreased breath sounds. She has no wheezes. She has no rhonchi. She has no rales.  Abdominal: Soft. Bowel sounds are normal. She exhibits no distension. There is no tenderness. There is no rigidity, no rebound, no guarding and no CVA tenderness.  Musculoskeletal tenderness along right flank.  Musculoskeletal:       Right wrist: She exhibits tenderness. She exhibits normal range of motion, no bony tenderness, no swelling, no effusion and no deformity.       Left wrist: She exhibits tenderness. She exhibits normal range of motion, no bony tenderness, no swelling, no effusion, no crepitus and no deformity.       Right hip: She exhibits tenderness. She exhibits normal range of motion, no bony tenderness, no swelling, no deformity and no laceration.       Left upper leg: She exhibits tenderness. She exhibits no bony tenderness, no swelling, no edema, no deformity and no laceration.  Musculoskeletal tenderness along right flank and right thigh. Full ROM intact, no bony tenderness.  Lymphadenopathy:       Head (right side): No submental, no submandibular, no tonsillar, no preauricular and no posterior auricular adenopathy present.       Head (left side): No submental, no submandibular, no tonsillar, no preauricular and no posterior auricular adenopathy present.    She has no cervical adenopathy.  Neurological: She is  alert and oriented to person, place, and time. She has normal strength. No cranial nerve deficit or sensory deficit. GCS eye subscore is 4. GCS verbal subscore is 5. GCS motor subscore is 6.  Reflex Scores:      Brachioradialis reflexes are 3+ on the right side and 3+ on the left side.      Patellar reflexes are 3+ on the right side and 3+ on the left side.      Achilles reflexes are 3+ on the right side and 3+ on the left side. Skin: Skin is warm and dry. No rash noted. She is not diaphoretic. No erythema.  Psychiatric: She has a normal mood and affect. Her behavior is normal.     Left during physical exam to vomit in the bathroom.      Assessment & Plan:  1. Anxiety state Advised patient to continue taking Citalopram daily as well at the Lorazepam, 0.5 mg to began and up to 1 mg as needed at night to sleep. RTC in 1 week to follow-up on anxiety and dizziness. RTC if symptoms are worsening or not improving before then.  2. Dizziness Recommended continued use of Lorazepam at night to sleep and regular use of Citalopram which should improve the dizziness. RTC in 1 week for follow-up or sooner if symptoms are not improving and we can consider addition of Antivert.  Recommended use of Tylenol as needed for musculoskeletal pain from fall. RTC if symptoms are worsening or not improving.

## 2016-03-19 NOTE — Progress Notes (Signed)
Patient ID: Wanda Nixon, female    DOB: 1990-09-10, 25 y.o.   MRN: [id number]  PCP: Ivar Drape, PA  Chief Complaint  Patient presents with  . Fall    fell down 10 stairs yesterday, pain on right side, left wrist hurts     Subjective:   Presents for evaluation of dizziness, following a fall at home yesterday.  Last night she was feeling lightheaded and when she tried to descend the stairs, stepped wrong and slipped on the carpeted stairs. She fell down the 8-10 stairs. Denies having hit her head, but feels achy on the entire RIGHT side and in both wrists.  She took acetaminophen and lorazepam which helped. Some visual "floaters" associated with feeling dizzy/lightheaded.  She had an episode of vertigo in 10/2014 for which she was evaluated in the ED. Meclizine was prescribed with resolution.  Her anxiety has been significantly increased. She starts a new job next week in [employer]. She has two children, ages 73 and 8 months.  About 1 month ago she was started on citalopram to help address her anxiety. When she developed URI-type symptoms 5 days ago, she stopped the citalopram, concerned that it would interact with the Theraflu she started taking. She then restarted the citalopram 2 days ago.  Review of Systems Constitutional: Positive for fatigue. Negative for chills and fever.  HENT: Positive for congestion and postnasal drip. Negative for ear discharge, ear pain, sinus pain, sinus pressure, sore throat and tinnitus.   Eyes: Positive for visual disturbance. Negative for photophobia, pain, discharge, redness and itching.  Respiratory: Negative for cough, chest tightness, shortness of breath and wheezing.   Cardiovascular: Negative for chest pain and palpitations.  Gastrointestinal: Positive for nausea. Negative for abdominal pain, blood in stool, constipation, diarrhea and vomiting.  Genitourinary: Negative for dysuria, frequency, hematuria and urgency.    Musculoskeletal: Positive for myalgias.  Neurological: Positive for dizziness, weakness, light-headedness and headaches. Negative for syncope.  Psychiatric/Behavioral: The patient is nervous/anxious.      Patient Active Problem List   Diagnosis Date Noted  . Asthma 02/26/2016  . Anxiety state 02/12/2016  . Migraine, unspecified, without mention of intractable migraine without mention of status migrainosus 10/25/2013     Prior to Admission medications   Medication Sig Start Date End Date Taking? Authorizing Provider  albuterol (PROVENTIL HFA;VENTOLIN HFA) 108 (90 Base) MCG/ACT inhaler Inhale 1-2 puffs into the lungs every 6 (six) hours as needed for wheezing or shortness of breath. 02/26/16  Yes Gregor Hams, MD  citalopram (CELEXA) 20 MG tablet Take 1 tablet (20 mg total) by mouth daily. 02/12/16  Yes Caylor Cerino, PA-C  LORazepam (ATIVAN) 1 MG tablet Take up 1/2 to 1 tablet up to 2x daily as needed for anxiety. 03/11/16  Yes Yobani Schertzer, PA-C  Acetaminophen-Caff-Pyrilamine (MIDOL COMPLETE PO) Take 2 tablets by mouth 2 (two) times daily as needed (for pain).    Historical Provider, MD     Allergies  Allergen Reactions  . Pulmicort [Budesonide] Palpitations    Racing heart  . Tomato Hives and Itching  . Other Hives    mushrooms       Objective:  Physical Exam  Constitutional: She is oriented to person, place, and time. She appears well-developed and well-nourished. She is active and cooperative. No distress.  BP 112/68 (BP Location: Right Arm, Patient Position: Sitting, Cuff Size: Small)   Pulse 96   Temp 98.2 F (36.8 C) (Oral)   Resp 16   Ht  4\' 11"  (1.499 m)   Wt 133 lb 12.8 oz (60.7 kg)   LMP 03/15/2016 (Approximate)   SpO2 100%   BMI 27.02 kg/m   HENT:  Head: Normocephalic and atraumatic.  Right Ear: Hearing normal.  Left Ear: Hearing normal.  Eyes: Conjunctivae are normal. No scleral icterus.  Neck: Normal range of motion. Neck supple. No thyromegaly  present.  Cardiovascular: Normal rate, regular rhythm and normal heart sounds.   Pulses:      Radial pulses are 2+ on the right side, and 2+ on the left side.  Pulmonary/Chest: Effort normal and breath sounds normal.  Musculoskeletal:       Cervical back: Normal.       Thoracic back: Normal.       Lumbar back: Normal.  Generalized tenderness of the RIGHT side of the body-arm, trunk, hip, leg. FROM. No bruising, abrasions, erythema.  Lymphadenopathy:       Head (right side): No tonsillar, no preauricular, no posterior auricular and no occipital adenopathy present.       Head (left side): No tonsillar, no preauricular, no posterior auricular and no occipital adenopathy present.    She has no cervical adenopathy.       Right: No supraclavicular adenopathy present.       Left: No supraclavicular adenopathy present.  Neurological: She is alert and oriented to person, place, and time. She has normal strength. No cranial nerve deficit or sensory deficit. She displays a negative Romberg sign. Coordination abnormal. Gait normal.  Reflex Scores:      Brachioradialis reflexes are 2+ on the right side and 2+ on the left side.      Patellar reflexes are 2+ on the right side and 2+ on the left side.      Achilles reflexes are 2+ on the right side and 2+ on the left side. Skin: Skin is warm, dry and intact. No rash noted. No cyanosis or erythema. Nails show no clubbing.  Psychiatric: She has a normal mood and affect. Her speech is normal and behavior is normal.           Assessment & Plan:   1. Anxiety state 2. Dizziness Suspect that her dizziness is related to anxiety and stopping then starting the citalopram. Anticipatory guidance. Supportive care. Acetaminophen or ibuprofen PRN muscle pain. Plan re-evaluation in 1 week, sooner if symptoms not improving or worsen.   Fara Chute, PA-C Physician Assistant-Certified [facility]

## 2016-03-19 NOTE — Patient Instructions (Signed)
     IF you received an x-ray today, you will receive an invoice from [facility]. Please contact [facility] at [phone] with questions or concerns regarding your invoice.   IF you received labwork today, you will receive an invoice from [facility]. Please contact [facility] at [phone] with questions or concerns regarding your invoice.   Our billing staff will not be able to assist you with questions regarding bills from these companies.  You will be contacted with the lab results as soon as they are available. The fastest way to get your results is to activate your My Chart account. Instructions are located on the last page of this paperwork. If you have not heard from us regarding the results in 2 weeks, please contact this office.      

## 2016-03-25 ENCOUNTER — Ambulatory Visit (INDEPENDENT_AMBULATORY_CARE_PROVIDER_SITE_OTHER): Payer: 59 | Admitting: Physician Assistant

## 2016-03-25 ENCOUNTER — Encounter: Payer: Self-pay | Admitting: Physician Assistant

## 2016-03-25 ENCOUNTER — Ambulatory Visit: Payer: 59

## 2016-03-25 VITALS — BP 118/58 | HR 88 | Temp 98.4°F | Resp 16 | Ht 59.0 in | Wt 133.0 lb

## 2016-03-25 DIAGNOSIS — M791 Myalgia, unspecified site: Secondary | ICD-10-CM

## 2016-03-25 DIAGNOSIS — J069 Acute upper respiratory infection, unspecified: Secondary | ICD-10-CM | POA: Diagnosis not present

## 2016-03-25 DIAGNOSIS — F411 Generalized anxiety disorder: Secondary | ICD-10-CM | POA: Diagnosis not present

## 2016-03-25 MED ORDER — AMOXICILLIN-POT CLAVULANATE 875-125 MG PO TABS: 1.0000 | ORAL_TABLET | Freq: Two times a day (BID) | ORAL | 0 refills | Status: AC

## 2016-03-25 MED ORDER — AZELASTINE HCL 0.15 % NA SOLN: 2.0000 | Freq: Two times a day (BID) | NASAL | 0 refills | Status: DC

## 2016-03-25 NOTE — Patient Instructions (Addendum)
  It's ok to take the ibuprofen for the muscle aches/pains. Take it with food to reduce upset stomach.  Stop expressing your milk. You will have a few days of breast fullness and discomfort, but that will go away and the milk production should stop.  Keep up the great work with learning to calm yourself.   IF you received an x-ray today, you will receive an invoice from [facility]. Please contact [facility] at [phone] with questions or concerns regarding your invoice.   IF you received labwork today, you will receive an invoice from [facility]. Please contact [facility] at [phone] with questions or concerns regarding your invoice.   Our billing staff will not be able to assist you with questions regarding bills from these companies.  You will be contacted with the lab results as soon as they are available. The fastest way to get your results is to activate your My Chart account. Instructions are located on the last page of this paperwork. If you have not heard from [place] regarding the results in 2 weeks, please contact this office.

## 2016-03-25 NOTE — Progress Notes (Signed)
Patient ID: Wanda Nixon, female    DOB: 1990/07/10, 25 y.o.   MRN: [id number]  PCP: Ivar Drape, PA  Chief Complaint  Patient presents with  . Follow-up    still in pain     Subjective:   Presents for evaluation of muscle pains.  I last saw her 03/19/2016 for anxiety exacerbation. She had been seen 4 days prior with URI symptoms, and had stopped citalopram, worried that the treatment for her respiratory illness would interact negatively with the SSRI. Her anxiety became unmanageable and our visit on 11/18 focused primarily on addressing her mood.   Still kind of antsy. Learning to calm herself. Tolerating the meds well.   Continues to have aches in the forearms, Hips and low back.She is a busy mother of 2 children under age 81 and works full time in [employer]. She fell a month or so ago, but isn't taking NSAIDS, again worried that there may be interactions with other medications or make her sleepy.  Continues to be congested. Now coughing up green phlegm.   Review of Systems As above    Patient Active Problem List   Diagnosis Date Noted  . Asthma 02/26/2016  . Anxiety state 02/12/2016  . Migraine, unspecified, without mention of intractable migraine without mention of status migrainosus 10/25/2013     Prior to Admission medications   Medication Sig Start Date End Date Taking? Authorizing Provider  Acetaminophen-Caff-Pyrilamine (MIDOL COMPLETE PO) Take 2 tablets by mouth 2 (two) times daily as needed (for pain).   Yes Historical Provider, MD  albuterol (PROVENTIL HFA;VENTOLIN HFA) 108 (90 Base) MCG/ACT inhaler Inhale 1-2 puffs into the lungs every 6 (six) hours as needed for wheezing or shortness of breath. 02/26/16  Yes Gregor Hams, MD  citalopram (CELEXA) 20 MG tablet Take 1 tablet (20 mg total) by mouth daily. 02/12/16  Yes Rayvn Rickerson, PA-C  LORazepam (ATIVAN) 1 MG tablet Take up 1/2 to 1 tablet up to 2x daily as needed for anxiety. 03/11/16   Yes Mekesha Solomon, PA-C     Allergies  Allergen Reactions  . Pulmicort [Budesonide] Palpitations    Racing heart  . Tomato Hives and Itching  . Other Hives    mushrooms       Objective:  Physical Exam  Constitutional: She is oriented to person, place, and time. She appears well-developed and well-nourished. She is active and cooperative. No distress.  BP (!) 118/58 (BP Location: Right Arm, Patient Position: Sitting, Cuff Size: Small)   Pulse 88   Temp 98.4 F (36.9 C) (Oral)   Resp 16   Ht 4\' 11"  (1.499 m)   Wt 133 lb (60.3 kg)   LMP 03/15/2016 (Approximate)   SpO2 100%   BMI 26.86 kg/m   HENT:  Head: Normocephalic and atraumatic.  Right Ear: Hearing, tympanic membrane, external ear and ear canal normal.  Left Ear: Hearing, tympanic membrane, external ear and ear canal normal.  Nose: Mucosal edema and rhinorrhea present. Right sinus exhibits no maxillary sinus tenderness and no frontal sinus tenderness. Left sinus exhibits no maxillary sinus tenderness and no frontal sinus tenderness.  Mouth/Throat: Uvula is midline, oropharynx is clear and moist and mucous membranes are normal.  Eyes: Conjunctivae are normal. No scleral icterus.  Neck: Normal range of motion. Neck supple. No thyromegaly present.  Cardiovascular: Normal rate, regular rhythm and normal heart sounds.   Pulses:      Radial pulses are 2+ on the right side, and 2+ on the  left side.  Pulmonary/Chest: Effort normal and breath sounds normal.  Lymphadenopathy:       Head (right side): No tonsillar, no preauricular, no posterior auricular and no occipital adenopathy present.       Head (left side): No tonsillar, no preauricular, no posterior auricular and no occipital adenopathy present.    She has no cervical adenopathy.       Right: No supraclavicular adenopathy present.       Left: No supraclavicular adenopathy present.  Neurological: She is alert and oriented to person, place, and time. No sensory deficit.    Skin: Skin is warm, dry and intact. No rash noted. No cyanosis or erythema. Nails show no clubbing.  Psychiatric: She has a normal mood and affect. Her speech is normal and behavior is normal.           Assessment & Plan:   1. Acute upper respiratory infection Given duration of symptoms, proceed with treatment for sinusitis.  - amoxicillin-clavulanate (AUGMENTIN) 875-125 MG tablet; Take 1 tablet by mouth 2 (two) times daily.  Dispense: 20 tablet; Refill: 0 - Azelastine HCl 0.15 % SOLN; Place 2 sprays into both nostrils 2 (two) times daily.  Dispense: 30 mL; Refill: 0  2. Anxiety state Much improved. Encouraged continuing the treatment and working on ways to de-escalate symptoms when they recur.  3. Myalgia Encouraged her to use NSAIDS. Reassured.  Return in about 4 weeks (around 04/22/2016) for re-evaluation.    Fara Chute, PA-C Physician Assistant-Certified [facility]

## 2016-03-28 ENCOUNTER — Ambulatory Visit (INDEPENDENT_AMBULATORY_CARE_PROVIDER_SITE_OTHER): Payer: 59 | Admitting: Physician Assistant

## 2016-03-28 VITALS — BP 118/82 | HR 97 | Temp 97.8°F | Resp 17 | Ht 59.0 in | Wt 132.0 lb

## 2016-03-28 DIAGNOSIS — M2550 Pain in unspecified joint: Secondary | ICD-10-CM

## 2016-03-28 DIAGNOSIS — M791 Myalgia, unspecified site: Secondary | ICD-10-CM

## 2016-03-28 LAB — POC MICROSCOPIC URINALYSIS (UMFC): Mucus: ABSENT

## 2016-03-28 LAB — POCT URINALYSIS DIP (MANUAL ENTRY)
BILIRUBIN UA: NEGATIVE
Bilirubin, UA: NEGATIVE
Blood, UA: NEGATIVE
Glucose, UA: NEGATIVE
Leukocytes, UA: NEGATIVE
Nitrite, UA: NEGATIVE
PH UA: 7
PROTEIN UA: NEGATIVE
SPEC GRAV UA: 1.015
UROBILINOGEN UA: 0.2

## 2016-03-28 NOTE — Progress Notes (Signed)
Patient ID: Wanda Nixon, female    DOB: 1991-04-13, 25 y.o.   MRN: [id number]  PCP: Harrison Mons, PA-C  Chief Complaint  Patient presents with  . Follow-up    "pain all over" states toe has been numb for 2days    Subjective:   Presents for evaluation of pain all over. She is accompanied by her husband and their two children, ages 66 months and 2 years.  She has been seen repeatedly here for myalgias/arthralgias and other acute symptoms over the past 2 months. Anxiety had been the primary issue, and initially she couldn't even tolerate being in the exam room.  About 2 weeks ago, she was seen with URI symptoms, and when they didn't improve, she was prescribed antibiotics for suspected bacterial sinusitis. That most recent visit was 11/24. The issue of her pain was also discussed, and she was encouraged to take ibuprofen 800 mg.  Today she relates that she hasn't taken any of the ibuprofen, and she remains afraid that she will have side effects, especially because she takes citalopram. Specifically, she's worried that she will feel dizzy.  She filled the Augmentin prescription yesterday (she couldn't afford it when it was prescribed on 11/24), but hasn't started it yet, again fearful that she may feel dizzy. She is using her mother's albuterol inhaler because she can't afford to fill her own.  "I feel like something is wrong, deep inside." In so much pain that she fell asleep without taking any of her medication, even the citalopram. She then awoke repeatedly during the night coughing and wheezing. "The only thing that doesn't hurt is my teeth." "I just don't tolerate medication well. Especially not that and working." She is in a new job-started last week. She really wants to do a good job and is afraid that her illness will cause her to make mistakes. She is also very afraid that we are missing something really serious that could kill her.  RIGHT great toe feels numb. Forearms ,  hands and fingers are sore. Low back and hips ache. Low pelvic pain.  No fever, chills. No vomiting. No diarrhea. No rash. No urinary urgency, frequency, burning.  She recalls that she lost a lot of blood with childbirth. CBC 01/26/16 was normal. Also 9/26: A1C 5.5%, BMET, TSH normal. 12/18/2015: HIV, RPR, GC/CT, CBC all normal, UCG negative, UA revealed some blood and epithelial cells.        Review of Systems As above.    Patient Active Problem List   Diagnosis Date Noted  . Asthma 02/26/2016  . Anxiety state 02/12/2016  . Migraine, unspecified, without mention of intractable migraine without mention of status migrainosus 10/25/2013     Prior to Admission medications   Medication Sig Start Date End Date Taking? Authorizing Provider  Acetaminophen-Caff-Pyrilamine (MIDOL COMPLETE PO) Take 2 tablets by mouth 2 (two) times daily as needed (for pain).   Yes Historical Provider, MD  albuterol (PROVENTIL HFA;VENTOLIN HFA) 108 (90 Base) MCG/ACT inhaler Inhale 1-2 puffs into the lungs every 6 (six) hours as needed for wheezing or shortness of breath. 02/26/16  Yes Gregor Hams, MD  amoxicillin-clavulanate (AUGMENTIN) 875-125 MG tablet Take 1 tablet by mouth 2 (two) times daily. 03/25/16 04/04/16 Yes Nahom Carfagno, PA-C  Azelastine HCl 0.15 % SOLN Place 2 sprays into both nostrils 2 (two) times daily. 03/25/16  Yes Kemet Nijjar, PA-C  citalopram (CELEXA) 20 MG tablet Take 1 tablet (20 mg total) by mouth daily. 02/12/16  Yes Harrison Mons,  PA-C  LORazepam (ATIVAN) 1 MG tablet Take up 1/2 to 1 tablet up to 2x daily as needed for anxiety. 03/11/16  Yes Miquela Costabile, PA-C     Allergies  Allergen Reactions  . Pulmicort [Budesonide] Palpitations    Racing heart  . Tomato Hives and Itching  . Other Hives    mushrooms       Objective:  Physical Exam  Constitutional: She is oriented to person, place, and time. She appears well-developed and well-nourished. She is active and  cooperative. No distress.  BP 118/82 (BP Location: Right Arm, Patient Position: Sitting, Cuff Size: Normal)   Pulse 97   Temp 97.8 F (36.6 C) (Oral)   Resp 17   Ht 4\' 11"  (1.499 m)   Wt 132 lb (59.9 kg)   LMP 03/15/2016 (Approximate)   SpO2 100%   BMI 26.66 kg/m   HENT:  Head: Normocephalic and atraumatic.  Right Ear: Hearing normal.  Left Ear: Hearing normal.  Eyes: Conjunctivae are normal. No scleral icterus.  Neck: Normal range of motion. Neck supple. No thyromegaly present.  Cardiovascular: Normal rate, regular rhythm and normal heart sounds.   Pulses:      Radial pulses are 2+ on the right side, and 2+ on the left side.  Pulmonary/Chest: Effort normal and breath sounds normal.  Abdominal: Soft. Normal appearance and bowel sounds are normal. There is no hepatosplenomegaly. There is tenderness in the suprapubic area. There is no rigidity, no rebound, no guarding, no CVA tenderness, no tenderness at McBurney's point and negative Murphy's sign.  Musculoskeletal:  Hands, fingers, arms, back, hips with FROM, but reports tenderness everywhere. No joints are erythematous or swollen. No increased warmth.  Lymphadenopathy:       Head (right side): No tonsillar, no preauricular, no posterior auricular and no occipital adenopathy present.       Head (left side): No tonsillar, no preauricular, no posterior auricular and no occipital adenopathy present.    She has no cervical adenopathy.       Right: No supraclavicular adenopathy present.       Left: No supraclavicular adenopathy present.  Neurological: She is alert and oriented to person, place, and time. No sensory deficit.  Skin: Skin is warm, dry and intact. No rash noted. No cyanosis or erythema. Nails show no clubbing.  Psychiatric: Her speech is normal and behavior is normal. Judgment normal. Her mood appears anxious. Her affect is labile. Her affect is not angry, not blunt and not inappropriate. Thought content is paranoid. Thought  content is not delusional. Cognition and memory are normal. She does not exhibit a depressed mood. She expresses no homicidal and no suicidal ideation. She expresses no suicidal plans and no homicidal plans.    Results for orders placed or performed in visit on 03/28/16  POCT urinalysis dipstick  Result Value Ref Range   Color, UA yellow yellow   Clarity, UA clear clear   Glucose, UA negative negative   Bilirubin, UA negative negative   Ketones, POC UA negative negative   Spec Grav, UA 1.015    Blood, UA negative negative   pH, UA 7.0    Protein Ur, POC negative negative   Urobilinogen, UA 0.2    Nitrite, UA Negative Negative   Leukocytes, UA Negative Negative  POCT Microscopic Urinalysis ([facility])  Result Value Ref Range   WBC,UR,HPF,POC None None WBC/hpf   RBC,UR,HPF,POC None None RBC/hpf   Bacteria None None, Too numerous to count   Mucus Absent Absent  Epithelial Cells, UR Per Microscopy Moderate (A) None, Too numerous to count cells/hpf           Assessment & Plan:   1. Arthralgia, unspecified joint 2. Pain in the muscles Await remaining labs. Suspect that her untreated sinusitis and anxiety are driving most of her symptoms. Reassured. Again encouraged to take the prescribed/recommended medications. - POCT urinalysis dipstick - POCT Microscopic Urinalysis ([facility]) - Rheumatoid Arthritis Diagnostic Panel, Comprehensive   Fara Chute, PA-C Physician Assistant-Certified [facility]

## 2016-03-28 NOTE — Patient Instructions (Addendum)
PLEASE take the ibuprofen and the Augmentin. I am concerned that if you don't, your illness and symptoms will worsen.    IF you received an x-ray today, you will receive an invoice from [facility]. Please contact [facility] at [phone] with questions or concerns regarding your invoice.   IF you received labwork today, you will receive an invoice from [employer]. Please contact [facility] at [phone] with questions or concerns regarding your invoice.   Our billing staff will not be able to assist you with questions regarding bills from these companies.  You will be contacted with the lab results as soon as they are available. The fastest way to get your results is to activate your My Chart account. Instructions are located on the last page of this paperwork. If you have not heard from [place] regarding the results in 2 weeks, please contact this office.

## 2016-03-30 LAB — RHEUMATOID ARTHRITIS DIAGNOSTIC PANEL, COMPREHENSIVE
Cyclic Citrullin Peptide Ab: <16 Units
Rheumatoid Factor (IgA): <5 U (ref <=6)
Rheumatoid Factor (IgG): <5 U (ref <=6)
SSA (Ro) (ENA) Antibody, IgG: <1
SSB (La) (ENA) Antibody, IgG: <1

## 2016-04-19 ENCOUNTER — Ambulatory Visit: Payer: 59 | Admitting: Physician Assistant

## 2016-04-29 ENCOUNTER — Other Ambulatory Visit: Payer: Self-pay | Admitting: Physician Assistant

## 2016-04-29 DIAGNOSIS — F411 Generalized anxiety disorder: Secondary | ICD-10-CM

## 2016-04-29 NOTE — Telephone Encounter (Signed)
Meds ordered this encounter  Medications  . LORazepam (ATIVAN) 1 MG tablet    Sig: Take 0.5-1 tablets (0.5-1 mg total) by mouth 2 (two) times daily as needed for anxiety.    Dispense:  20 tablet    Refill:  0    Not to exceed 5 additional fills before 09/07/2016.

## 2016-04-30 NOTE — Telephone Encounter (Signed)
Called to [address]

## 2016-05-04 ENCOUNTER — Other Ambulatory Visit: Payer: Self-pay | Admitting: Physician Assistant

## 2016-05-04 DIAGNOSIS — F411 Generalized anxiety disorder: Secondary | ICD-10-CM

## 2016-05-05 NOTE — Telephone Encounter (Signed)
SS refill req for Lorazepam To [facility]

## 2016-05-06 NOTE — Telephone Encounter (Signed)
Meds ordered this encounter  Medications  . LORazepam (ATIVAN) 1 MG tablet    Sig: TAKE 1/2-1 TABLET TWICE A DAY AS NEEDED.    Dispense:  20 tablet    Refill:  0    Not to exceed 5 additional fills before 06/28/2018PT HAS LOST MEDICATION.

## 2016-05-07 NOTE — Telephone Encounter (Signed)
Faxed to [facility]

## 2016-05-10 ENCOUNTER — Encounter (HOSPITAL_BASED_OUTPATIENT_CLINIC_OR_DEPARTMENT_OTHER): Payer: Self-pay

## 2016-05-10 ENCOUNTER — Emergency Department (HOSPITAL_BASED_OUTPATIENT_CLINIC_OR_DEPARTMENT_OTHER)
Admission: EM | Admit: 2016-05-10 | Discharge: 2016-05-10 | Disposition: A | Discharge status: Home / Self Care | Payer: BLUE CROSS/BLUE SHIELD | Attending: Emergency Medicine | Admitting: Emergency Medicine

## 2016-05-10 DIAGNOSIS — J029 Acute pharyngitis, unspecified: Secondary | ICD-10-CM | POA: Diagnosis present

## 2016-05-10 DIAGNOSIS — J45909 Unspecified asthma, uncomplicated: Secondary | ICD-10-CM | POA: Insufficient documentation

## 2016-05-10 DIAGNOSIS — J040 Acute laryngitis: Secondary | ICD-10-CM | POA: Diagnosis not present

## 2016-05-10 LAB — RAPID STREP SCREEN (MED CTR MEBANE ONLY): Streptococcus, Group A Screen (Direct): NEGATIVE

## 2016-05-10 MED ORDER — ACETAMINOPHEN 500 MG PO TABS
ORAL_TABLET | ORAL | Status: AC
  Filled 2016-05-10: qty 2

## 2016-05-10 MED ORDER — ACETAMINOPHEN 500 MG PO TABS
1000.0000 mg | ORAL_TABLET | Freq: Once | ORAL | Status: AC
  Administered 2016-05-10: 1000 mg via ORAL

## 2016-05-10 MED ORDER — BENZONATATE 100 MG PO CAPS: 100.0000 mg | ORAL_CAPSULE | Freq: Three times a day (TID) | ORAL | 0 refills | Status: DC | PRN

## 2016-05-10 NOTE — ED Provider Notes (Signed)
[place] DEPT [facility] Provider Note   CSN: [id number] Arrival date & time: 05/10/16  1343     History   Chief Complaint Chief Complaint  Patient presents with  . Laryngitis    HPI Wanda Nixon is a 26 y.o. female.  HPI Wanda Nixon is a 26 y.o. female with PMH significant for asthma and eczema who presents with 2 day history of sore throat, hoarseness, ear fullness, productive cough of green sputum, and myalgias.  No fever, chills, N/V/D, or abdominal pain.  She has been taking NyQuil and DayQuil with minimal relief.  Son sick with ear infection.  No drooling, trismus, or difficulty swallowing.   Past Medical History:  Diagnosis Date  . Asthma    Last used 08/30/14  . Cholestasis of pregnancy in third trimester 07/25/2015  . Eczema   . Headache(784.0)   . NST (non-stress test) nonreactive   . SVD (spontaneous vaginal delivery) 07/26/2015    Patient Active Problem List   Diagnosis Date Noted  . Asthma 02/26/2016  . Anxiety state 02/12/2016  . Migraine, unspecified, without mention of intractable migraine without mention of status migrainosus 10/25/2013    Past Surgical History:  Procedure Laterality Date  . MOUTH SURGERY    . TYMPANOSTOMY TUBE PLACEMENT    . WISDOM TOOTH EXTRACTION      OB History    Gravida Para Term Preterm AB Living   2 2 2     2    SAB TAB Ectopic Multiple Live Births         0 2       Home Medications    Prior to Admission medications   Medication Sig Start Date End Date Taking? Authorizing Provider  albuterol (PROVENTIL HFA;VENTOLIN HFA) 108 (90 Base) MCG/ACT inhaler Inhale 1-2 puffs into the lungs every 6 (six) hours as needed for wheezing or shortness of breath. 02/26/16   Gregor Hams, MD  citalopram (CELEXA) 20 MG tablet Take 1 tablet (20 mg total) by mouth daily. 02/12/16   Chelle Jeffery, PA-C  LORazepam (ATIVAN) 1 MG tablet TAKE 1/2-1 TABLET TWICE A DAY AS NEEDED. 05/06/16   Harrison Mons, PA-C    Family History Family  History  Problem Relation Age of Onset  . Diabetes Maternal Grandmother   . Hypertension Maternal Grandmother   . Stroke Maternal Grandmother   . High blood pressure Mother   . Hypertension Mother     Social History Social History  Substance Use Topics  . Smoking status: Never Smoker  . Smokeless tobacco: Never Used  . Alcohol use No     Allergies   Pulmicort [budesonide]; Tomato; and Other   Review of Systems Review of Systems All other systems negative unless otherwise stated in HPI   Physical Exam Updated Vital Signs BP 111/78 (BP Location: Right Arm)   Pulse 83   Temp 99 F (37.2 C) (Oral)   Resp 16   Ht 4\' 11"  (1.499 m)   Wt 58.5 kg   LMP 05/09/2016   SpO2 100%   BMI 26.05 kg/m   Physical Exam  Constitutional: She is oriented to person, place, and time. She appears well-developed and well-nourished. She is active.  Non-toxic appearance. She does not have a sickly appearance. She does not appear ill.  HENT:  Head: Normocephalic and atraumatic.  Right Ear: Tympanic membrane and external ear normal. Tympanic membrane is not erythematous and not bulging.  Left Ear: Tympanic membrane and external ear normal. Tympanic membrane is not erythematous  and not bulging.  Nose: Nose normal.  Mouth/Throat: Uvula is midline, oropharynx is clear and moist and mucous membranes are normal. No trismus in the jaw. No uvula swelling. No oropharyngeal exudate, posterior oropharyngeal edema, posterior oropharyngeal erythema or tonsillar abscesses.  Neck: Normal range of motion. Neck supple.  No nuchal rigidity.  Hoarse voice.   Cardiovascular: Normal rate and regular rhythm.   Pulmonary/Chest: Effort normal and breath sounds normal. No stridor. No respiratory distress. She has no wheezes. She has no rales.  Abdominal: Soft. Bowel sounds are normal. She exhibits no distension. There is no tenderness.  Musculoskeletal: Normal range of motion.  Lymphadenopathy:    She has no  cervical adenopathy.  Neurological: She is alert and oriented to person, place, and time.  Skin: Skin is warm and dry.  Psychiatric: She has a normal mood and affect. Her behavior is normal.     ED Treatments / Results  Labs (all labs ordered are listed, but only abnormal results are displayed) Labs Reviewed  RAPID STREP SCREEN (NOT AT [facility])    EKG  EKG Interpretation None       Radiology No results found.  Procedures Procedures (including critical care time)  Medications Ordered in ED Medications - No data to display   Initial Impression / Assessment and Plan / ED Course  I have reviewed the triage vital signs and the nursing notes.  Pertinent labs & imaging results that were available during my care of the patient were reviewed by me and considered in my medical decision making (see chart for details).  Clinical Course    Symptoms consistent with laryngitis.  Vitals stable.  No signs or symptoms of RPA or PTA.  Rapid strep negative.  Likely viral in nature.  Discussed increasing fluid intake and humidified air.  Motrin and Tylenol for pain.  Tessalon perles for cough.  Lungs CTAB, no indication for imaging at this time, low suspicion for PNA.  Return precautions discussed.  Stable for discharge.    Final Clinical Impressions(s) / ED Diagnoses   Final diagnoses:  Laryngitis, acute    New Prescriptions New Prescriptions   No medications on file     Gloriann Loan, PA-C 05/10/16 [place], DO 05/10/16 2310

## 2016-05-10 NOTE — Discharge Instructions (Signed)
Your symptoms are likely viral in nature. Your rapid strep is negative. Drink plenty of fluids and warm liquids to help your throat.  Please take 800 mg ibuprofen and 1000 mg of Tylenol for pain. Take Zyrtec daily for your ear fullness. Take Tessalon Perles 3 times daily for your cough. Follow up with her primary care doctor. Return to the emergency department for any new or concerning symptoms.

## 2016-05-10 NOTE — ED Triage Notes (Signed)
C/o "lost vice" x 2 days-NAD-steady gait

## 2016-05-12 ENCOUNTER — Encounter (HOSPITAL_BASED_OUTPATIENT_CLINIC_OR_DEPARTMENT_OTHER): Payer: Self-pay | Admitting: Emergency Medicine

## 2016-05-12 ENCOUNTER — Emergency Department (HOSPITAL_BASED_OUTPATIENT_CLINIC_OR_DEPARTMENT_OTHER): Payer: BLUE CROSS/BLUE SHIELD

## 2016-05-12 ENCOUNTER — Emergency Department (HOSPITAL_BASED_OUTPATIENT_CLINIC_OR_DEPARTMENT_OTHER)
Admission: EM | Admit: 2016-05-12 | Discharge: 2016-05-12 | Disposition: A | Discharge status: Home / Self Care | Payer: BLUE CROSS/BLUE SHIELD | Attending: Emergency Medicine | Admitting: Emergency Medicine

## 2016-05-12 DIAGNOSIS — J069 Acute upper respiratory infection, unspecified: Secondary | ICD-10-CM

## 2016-05-12 DIAGNOSIS — R05 Cough: Secondary | ICD-10-CM | POA: Diagnosis present

## 2016-05-12 DIAGNOSIS — Z79899 Other long term (current) drug therapy: Secondary | ICD-10-CM | POA: Insufficient documentation

## 2016-05-12 DIAGNOSIS — J45909 Unspecified asthma, uncomplicated: Secondary | ICD-10-CM | POA: Diagnosis not present

## 2016-05-12 MED ORDER — ACETAMINOPHEN 500 MG PO TABS
1000.0000 mg | ORAL_TABLET | Freq: Once | ORAL | Status: AC
  Administered 2016-05-12: 1000 mg via ORAL
  Filled 2016-05-12: qty 2

## 2016-05-12 MED ORDER — IBUPROFEN 800 MG PO TABS
800.0000 mg | ORAL_TABLET | Freq: Once | ORAL | Status: AC
  Administered 2016-05-12: 800 mg via ORAL
  Filled 2016-05-12: qty 1

## 2016-05-12 MED ORDER — PREDNISONE 20 MG PO TABS: 40.0000 mg | ORAL_TABLET | Freq: Every day | ORAL | 0 refills | Status: DC

## 2016-05-12 MED ORDER — DOXYCYCLINE HYCLATE 100 MG PO CAPS: 100.0000 mg | ORAL_CAPSULE | Freq: Two times a day (BID) | ORAL | 0 refills | Status: DC

## 2016-05-12 NOTE — Discharge Instructions (Signed)
Your chest x-ray today is normal. Your symptoms are likely viral in nature. Continue taking Tessalon Perles, Motrin, and Tylenol for pain. You may start taking prednisone as well. Please follow-up with your primary care physician in the next couple of days. If your symptoms persist in the next 2-3 days. You may start taking the doxycycline. Return to emergency department for any new or concerning symptoms.

## 2016-05-12 NOTE — ED Provider Notes (Signed)
[place] DEPT [facility] Provider Note   CSN: [id number] Arrival date & time: 05/12/16  0902     History   Chief Complaint Chief Complaint  Patient presents with  . Cough    HPI Genecis Schmoll is a 26 y.o. female.  HPI Catelin Vogen is a 26 y.o. female with PMH significant for asthma who presents with continued cough, sore throat, ear fullness, shortness of breath, and myalgias.  She has a temperature of 100 in triage.  No CP. Patient was seen 2 days ago for the same complaint and discharged after negative rapid strep and given tessalon perles. No N/V/D or abdominal pain.  She has been taking the tessalon perles, motrin, and tylenol.  She has been using breathing treatments as well with little relief. No hx of PE/DVT, recent surgery or immobilization, unilateral leg swelling, or exogenous estrogen use. She also states she has not been taking her Celexa for the last 2 days and feels "weird".   Past Medical History:  Diagnosis Date  . Asthma    Last used 08/30/14  . Cholestasis of pregnancy in third trimester 07/25/2015  . Eczema   . Headache(784.0)   . NST (non-stress test) nonreactive   . SVD (spontaneous vaginal delivery) 07/26/2015    Patient Active Problem List   Diagnosis Date Noted  . Asthma 02/26/2016  . Anxiety state 02/12/2016  . Migraine, unspecified, without mention of intractable migraine without mention of status migrainosus 10/25/2013    Past Surgical History:  Procedure Laterality Date  . MOUTH SURGERY    . TYMPANOSTOMY TUBE PLACEMENT    . WISDOM TOOTH EXTRACTION      OB History    Gravida Para Term Preterm AB Living   2 2 2     2    SAB TAB Ectopic Multiple Live Births         0 2       Home Medications    Prior to Admission medications   Medication Sig Start Date End Date Taking? Authorizing Provider  albuterol (PROVENTIL HFA;VENTOLIN HFA) 108 (90 Base) MCG/ACT inhaler Inhale 1-2 puffs into the lungs every 6 (six) hours as needed for wheezing  or shortness of breath. 02/26/16   Gregor Hams, MD  benzonatate (TESSALON) 100 MG capsule Take 1 capsule (100 mg total) by mouth 3 (three) times daily as needed for cough. 05/10/16   Gloriann Loan, PA-C  citalopram (CELEXA) 20 MG tablet Take 1 tablet (20 mg total) by mouth daily. 02/12/16   Chelle Jeffery, PA-C  doxycycline (VIBRAMYCIN) 100 MG capsule Take 1 capsule (100 mg total) by mouth 2 (two) times daily. 05/12/16   Gloriann Loan, PA-C  LORazepam (ATIVAN) 1 MG tablet TAKE 1/2-1 TABLET TWICE A DAY AS NEEDED. 05/06/16   Chelle Jeffery, PA-C  predniSONE (DELTASONE) 20 MG tablet Take 2 tablets (40 mg total) by mouth daily. 05/12/16   Gloriann Loan, PA-C    Family History Family History  Problem Relation Age of Onset  . Diabetes Maternal Grandmother   . Hypertension Maternal Grandmother   . Stroke Maternal Grandmother   . High blood pressure Mother   . Hypertension Mother     Social History Social History  Substance Use Topics  . Smoking status: Never Smoker  . Smokeless tobacco: Never Used  . Alcohol use No     Allergies   Pulmicort [budesonide]; Tomato; and Other   Review of Systems Review of Systems All other systems negative unless otherwise stated in HPI  Physical Exam Updated Vital Signs BP 136/86 (BP Location: Left Arm)   Pulse 96   Temp 100 F (37.8 C) (Oral)   Resp 24   Wt 58.5 kg   LMP 05/09/2016   SpO2 100%   BMI 26.05 kg/m   Physical Exam  Constitutional: She is oriented to person, place, and time. She appears well-developed and well-nourished. She is active.  Non-toxic appearance. She does not have a sickly appearance. She does not appear ill.  HENT:  Head: Normocephalic and atraumatic.  Right Ear: Tympanic membrane and external ear normal. Tympanic membrane is not erythematous and not bulging.  Left Ear: Tympanic membrane and external ear normal. Tympanic membrane is not erythematous and not bulging.  Nose: Nose normal.  Mouth/Throat: Uvula is midline,  oropharynx is clear and moist and mucous membranes are normal. No trismus in the jaw. No uvula swelling. No oropharyngeal exudate, posterior oropharyngeal edema, posterior oropharyngeal erythema or tonsillar abscesses.  Hoarse voice  Neck: Normal range of motion. Neck supple.  No nuchal rigidity.   Cardiovascular: Normal rate and regular rhythm.   Pulmonary/Chest: Effort normal and breath sounds normal. No respiratory distress. She has no wheezes. She has no rales.  Abdominal: Soft. Bowel sounds are normal. She exhibits no distension. There is no tenderness.  Musculoskeletal: Normal range of motion.  Lymphadenopathy:    She has no cervical adenopathy.  Neurological: She is alert and oriented to person, place, and time.  Skin: Skin is warm and dry.  Psychiatric: She has a normal mood and affect. Her behavior is normal.     ED Treatments / Results  Labs (all labs ordered are listed, but only abnormal results are displayed) Labs Reviewed - No data to display  EKG  EKG Interpretation None       Radiology Dg Chest 2 View  Result Date: 05/12/2016 CLINICAL DATA:  Fever.  Cough . EXAM: CHEST  2 VIEW COMPARISON:  12/11/2013. FINDINGS: Mediastinum hilar structures normal. Lungs are clear. No pleural effusion or pneumothorax. No acute bony abnormality. IMPRESSION: No acute cardiopulmonary disease. Electronically Signed   By: Marcello Moores  Register   On: 05/12/2016 10:03    Procedures Procedures (including critical care time)  Medications Ordered in ED Medications  ibuprofen (ADVIL,MOTRIN) tablet 800 mg (800 mg Oral Given 05/12/16 1023)  acetaminophen (TYLENOL) tablet 1,000 mg (1,000 mg Oral Given 05/12/16 1024)     Initial Impression / Assessment and Plan / ED Course  I have reviewed the triage vital signs and the nursing notes.  Pertinent labs & imaging results that were available during my care of the patient were reviewed by me and considered in my medical decision making (see chart  for details).  Clinical Course    Patient's symptoms c/w URI that may be exacerbating her asthma.  Vitals stable. Tessalon perles working.  Patient appears well, non-toxic or ill.  Lungs CTAB.  CXR clear.  Low suspicion PE, patient is low risk Well's criteria and PERC negative.  Will discharge home with prednisone and future dated prescription for doxycycline. Discussed filling if symptoms persist in 2-3 days.  Also, discussed compliance with celexa as abrupt cessation may cause withdrawal symptoms.  Return precautions discussed.  Stable for discharge.   Final Clinical Impressions(s) / ED Diagnoses   Final diagnoses:  Upper respiratory tract infection, unspecified type    New Prescriptions Discharge Medication List as of 05/12/2016 10:47 AM    START taking these medications   Details  doxycycline (VIBRAMYCIN) 100 MG capsule  Take 1 capsule (100 mg total) by mouth 2 (two) times daily., Starting Thu 05/12/2016, Print    predniSONE (DELTASONE) 20 MG tablet Take 2 tablets (40 mg total) by mouth daily., Starting Thu 05/12/2016, Print         Gloriann Loan, PA-C 05/12/16 [place], MD 05/13/16 [phone]

## 2016-05-12 NOTE — ED Triage Notes (Signed)
Cough, congestion, SOB since Monday. Was seen here this week and given cough meds. Pt states she has hx of asthma and SOB is worsening.

## 2016-05-13 ENCOUNTER — Ambulatory Visit: Payer: 59

## 2016-05-13 LAB — CULTURE, GROUP A STREP (THRC)

## 2016-05-16 ENCOUNTER — Ambulatory Visit (INDEPENDENT_AMBULATORY_CARE_PROVIDER_SITE_OTHER): Payer: BLUE CROSS/BLUE SHIELD | Admitting: Physician Assistant

## 2016-05-16 VITALS — BP 122/80 | HR 91 | Temp 98.3°F | Ht 59.0 in | Wt 128.2 lb

## 2016-05-16 DIAGNOSIS — R059 Cough, unspecified: Secondary | ICD-10-CM

## 2016-05-16 DIAGNOSIS — R05 Cough: Secondary | ICD-10-CM | POA: Diagnosis not present

## 2016-05-16 LAB — POCT CBC
GRANULOCYTE PERCENT: 78.6 % (ref 37–80)
HEMATOCRIT: 37.6 % — AB (ref 37.7–47.9)
HEMOGLOBIN: 12.7 g/dL (ref 12.2–16.2)
Lymph, poc: 1.9 (ref 0.6–3.4)
MCH: 27.5 pg (ref 27–31.2)
MCHC: 33.8 g/dL (ref 31.8–35.4)
MCV: 81.5 fL (ref 80–97)
MID (cbc): 0.5 (ref 0–0.9)
MPV: 8.2 fL (ref 0–99.8)
POC GRANULOCYTE: 8.9 — AB (ref 2–6.9)
POC LYMPH PERCENT: 16.7 %L (ref 10–50)
POC MID %: 4.7 % (ref 0–12)
Platelet Count, POC: 369 10*3/uL (ref 142–424)
RBC: 4.61 M/uL (ref 4.04–5.48)
RDW, POC: 14.4 %
WBC: 11.3 10*3/uL — AB (ref 4.6–10.2)

## 2016-05-16 MED ORDER — ALBUTEROL SULFATE (2.5 MG/3ML) 0.083% IN NEBU
2.5000 mg | INHALATION_SOLUTION | Freq: Once | RESPIRATORY_TRACT | Status: AC
  Administered 2016-05-16: 2.5 mg via RESPIRATORY_TRACT

## 2016-05-16 MED ORDER — IPRATROPIUM BROMIDE 0.02 % IN SOLN
0.5000 mg | Freq: Once | RESPIRATORY_TRACT | Status: AC
  Administered 2016-05-16: 0.5 mg via RESPIRATORY_TRACT

## 2016-05-16 NOTE — Progress Notes (Signed)
Patient ID: Wanda Nixon, female    DOB: June 04, 1990, 26 y.o.   MRN: [id number]  PCP: Harrison Mons, PA-C  Chief Complaint  Patient presents with  . Cough    X 7 days with SOB   . Generalized Body Aches    X 7 days    Subjective:   Presents for evaluation of cough and body aches. She is accompanied by her husband.  She developed symptoms 7 days ago. Both of her children have tested positive for influenza.  Seen in the ED 1/09 and 1/11. At the first visit, she presented with laryngitis, associated with sore throat, ear fullness, myalgias and cough productive of green sputum.At that time, she reported her son had been diagnosed with an ear infection. Rapid strep was negative and ultimately culture was negative. She was thought to have a viral illness and was prescribed benzonatate, which she did not fill due to inability to afford it. Her mother had some cough syrup that she took instead (promethazine DM). She returned to the ED 2 days later with continued symptoms, including SOB. CXR on 05/12/2016 was normal. Thought to have asthma exacerbation due to viral URI and was prescribed an oral steroid taper, and a prescription for doxycycline to fill if she wasn't improving. She has filled the prescription and started the doxycycline.  She continues to have laryngitis. Tried to rest over the weekend, but her job requires talking. 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 She feels like she can't breathe, and is using home nebs. Chest wall pain and headache with coughing. No fever, chills. No GU symptoms. She does have coughing to gag and vomiting, but no nausea.   Overall she feels like her anxiety is well controlled on the citalopram, and she has not taken Ativan in some time.  No other chest pain. No palpitations. No LE edema.   Review of Systems As above.    Patient Active Problem List   Diagnosis Date Noted  . Asthma 02/26/2016  . Anxiety state 02/12/2016  . Migraine,  unspecified, without mention of intractable migraine without mention of status migrainosus 10/25/2013     Prior to Admission medications   Medication Sig Start Date End Date Taking? Authorizing Provider  albuterol (PROVENTIL HFA;VENTOLIN HFA) 108 (90 Base) MCG/ACT inhaler Inhale 1-2 puffs into the lungs every 6 (six) hours as needed for wheezing or shortness of breath. 02/26/16  Yes Gregor Hams, MD  citalopram (CELEXA) 20 MG tablet Take 1 tablet (20 mg total) by mouth daily. 02/12/16  Yes Rudy Domek, PA-C  doxycycline (VIBRAMYCIN) 100 MG capsule Take 1 capsule (100 mg total) by mouth 2 (two) times daily. 05/12/16  Yes Kayla Rose, PA-C  LORazepam (ATIVAN) 1 MG tablet TAKE 1/2-1 TABLET TWICE A DAY AS NEEDED. 05/06/16  Yes Tremel Setters, PA-C  predniSONE (DELTASONE) 20 MG tablet Take 2 tablets (40 mg total) by mouth daily. 05/12/16  Yes Kayla Rose, PA-C  benzonatate (TESSALON) 100 MG capsule Take 1 capsule (100 mg total) by mouth 3 (three) times daily as needed for cough. Patient not taking: Reported on 05/16/2016 05/10/16   Gloriann Loan, PA-C     Allergies  Allergen Reactions  . Pulmicort [Budesonide] Palpitations    Racing heart  . Tomato Hives and Itching  . Other Hives    mushrooms       Objective:  Physical Exam  Constitutional: She is oriented to person, place, and time. She appears well-developed and well-nourished. She is active and  cooperative. No distress.  BP 122/80 (BP Location: Right Arm, Patient Position: Sitting, Cuff Size: Small)   Pulse 91   Temp 98.3 F (36.8 C) (Oral)   Ht 4\' 11"  (1.499 m)   Wt 128 lb 3.2 oz (58.2 kg)   LMP 05/09/2016   SpO2 100%   BMI 25.89 kg/m   HENT:  Head: Normocephalic and atraumatic.  Right Ear: Hearing normal.  Left Ear: Hearing normal.  Eyes: Conjunctivae are normal. No scleral icterus.  Neck: Normal range of motion. Neck supple. No thyromegaly present.  Cardiovascular: Normal rate, regular rhythm and normal heart sounds.    Pulses:      Radial pulses are 2+ on the right side, and 2+ on the left side.  Pulmonary/Chest: Effort normal and breath sounds normal.  Initially, lung sounds were "tight" but cleared completely after neb treatment.  Lymphadenopathy:       Head (right side): No tonsillar, no preauricular, no posterior auricular and no occipital adenopathy present.       Head (left side): No tonsillar, no preauricular, no posterior auricular and no occipital adenopathy present.    She has no cervical adenopathy.       Right: No supraclavicular adenopathy present.       Left: No supraclavicular adenopathy present.  Neurological: She is alert and oriented to person, place, and time. No sensory deficit.  Skin: Skin is warm, dry and intact. No rash noted. No cyanosis or erythema. Nails show no clubbing.  Psychiatric: She has a normal mood and affect. Her speech is normal and behavior is normal.    Albuterol + Atrovent neb treatment resulted in significant improvement in her symptoms.   Results for orders placed or performed in visit on 05/16/16  POCT CBC  Result Value Ref Range   WBC 11.3 (A) 4.6 - 10.2 K/uL   Lymph, poc 1.9 0.6 - 3.4   POC LYMPH PERCENT 16.7 10 - 50 %L   MID (cbc) 0.5 0 - 0.9   POC MID % 4.7 0 - 12 %M   POC Granulocyte 8.9 (A) 2 - 6.9   Granulocyte percent 78.6 37 - 80 %G   RBC 4.61 4.04 - 5.48 M/uL   Hemoglobin 12.7 12.2 - 16.2 g/dL   HCT, POC 37.6 (A) 37.7 - 47.9 %   MCV 81.5 80 - 97 fL   MCH, POC 27.5 27 - 31.2 pg   MCHC 33.8 31.8 - 35.4 g/dL   RDW, POC 14.4 %   Platelet Count, POC 369 142 - 424 K/uL   MPV 8.2 0 - 99.8 fL        Assessment & Plan:   1. Cough Presumably, she has influenza. Testing now not beneficial. Continue current treatment of oral prednisone taper, doxycycline, home nebs, OTC guaifenesin, mother's promethazine DM, OTC saline nasal drops. Continue acetaminophen PRN body aches. Rest. Voice rest. Encouraged her to use Ativan as needed for anxiety  associated with her symptoms. RTC on Thursday 05/19/16 unless symptoms are significantly improved, sooner if worse. - albuterol (PROVENTIL) (2.5 MG/3ML) 0.083% nebulizer solution 2.5 mg; Take 3 mLs (2.5 mg total) by nebulization once. - ipratropium (ATROVENT) nebulizer solution 0.5 mg; Take 2.5 mLs (0.5 mg total) by nebulization once. - POCT CBC   Fara Chute, PA-C Physician Assistant-Certified [facility]

## 2016-05-16 NOTE — Patient Instructions (Addendum)
Doxycycline (antibiotic). Take every 12 hours with food. Prednisone. Reduces inflammation, helps congestion and cough, and asthma. Take dose each morning with food. Guaifenesin. Thins mucous. Take every 12 hours with food. Acetminophen (Tylenol) 325 mg every 4 hours for headache, body aches. Ibuprofen 600 mg (3 of the 200 mg tablets) every 8 hours with food. DO NOT TAKE THIS WHEN YOU ARE ON THE PREDNISONE. Saline Nasal drops every 2 hours as needed for congestion. Promethazine DM cough syrup, 5 ml every 4-6 hours as needed for cough. REST. 64 ounces of water every day.    IF you received an x-ray today, you will receive an invoice from [facility]. Please contact [facility] at [phone] with questions or concerns regarding your invoice.   IF you received labwork today, you will receive an invoice from [facility]. Please contact [facility] at [phone] with questions or concerns regarding your invoice.   Our billing staff will not be able to assist you with questions regarding bills from these companies.  You will be contacted with the lab results as soon as they are available. The fastest way to get your results is to activate your My Chart account. Instructions are located on the last page of this paperwork. If you have not heard from [place] regarding the results in 2 weeks, please contact this office.

## 2016-05-16 NOTE — Progress Notes (Signed)
Patient ID: Wanda Nixon, female    DOB: July 15, 1990, 26 y.o.   MRN: [id number]  PCP: Harrison Mons, PA-C  Chief Complaint  Patient presents with  . Cough    X 7 days with SOB   . Generalized Body Aches    X 7 days    Subjective:   Presents for evaluation of cough and body aches.  Pt is a 26yo AA female with a history of asthma who presents with 7 days of hoarseness, sore throat, cough, nasal congestion, and body aches. Accompanied by husband. She was seen in the ED on 1/09 when she as diagnosed with viral laryngitis and on 1/11 when she was treated with doxycycline and prednisone for a presumed URI. Today, she states that she has had continuing fever (Tmax 101F), fatigue, headache, hoarseness, sore throat, cough, SOB, nausea, anorexia, and body aches. She states that she has had to use her rescue inhaler several times a day, every day this week. Both of her small children recently tested positive for influenza.   Review of Systems HEENT: Denies sinus pressure, ear pain, or red eyes. Pulm: Denies hemoptysis. CV: Admits to palpitations associated with anxiety and asthma symptoms. Denies chest pain. Abd: Denies abdominal pain, diarrhea, or constipation.   Patient Active Problem List   Diagnosis Date Noted  . Asthma 02/26/2016  . Anxiety state 02/12/2016  . Migraine, unspecified, without mention of intractable migraine without mention of status migrainosus 10/25/2013     Prior to Admission medications   Medication Sig Start Date End Date Taking? Authorizing Provider  albuterol (PROVENTIL HFA;VENTOLIN HFA) 108 (90 Base) MCG/ACT inhaler Inhale 1-2 puffs into the lungs every 6 (six) hours as needed for wheezing or shortness of breath. 02/26/16  Yes Gregor Hams, MD  citalopram (CELEXA) 20 MG tablet Take 1 tablet (20 mg total) by mouth daily. 02/12/16  Yes Chelle Jeffery, PA-C  doxycycline (VIBRAMYCIN) 100 MG capsule Take 1 capsule (100 mg total) by mouth 2 (two) times daily.  05/12/16  Yes Kayla Rose, PA-C  LORazepam (ATIVAN) 1 MG tablet TAKE 1/2-1 TABLET TWICE A DAY AS NEEDED. 05/06/16  Yes Chelle Jeffery, PA-C  predniSONE (DELTASONE) 20 MG tablet Take 2 tablets (40 mg total) by mouth daily. 05/12/16  Yes Kayla Rose, PA-C  benzonatate (TESSALON) 100 MG capsule Take 1 capsule (100 mg total) by mouth 3 (three) times daily as needed for cough. Patient not taking: Reported on 05/16/2016 05/10/16   Gloriann Loan, PA-C     Allergies  Allergen Reactions  . Pulmicort [Budesonide] Palpitations    Racing heart  . Tomato Hives and Itching  . Other Hives    mushrooms       Objective:  Physical Exam Well developed, well nourished, no respiratory distress. HEENT: PERRLA; Throat is mildly erythematous, no exudates, no tonsillar hypertrophy; ear canals are clear bilaterally, TMs are intact, nonbulging; Nasal turbinates are mildly erythematous bilaterally.  Pulm: Limited air exchange; CTAB; No wheezes, rales, rhonchi. CV: RRR; no M/R/G. Abd: Nontender, nondisteded; + BS x 4 quadrants; no hepatosplenomegaly.     Assessment & Plan:   1. Cough Pt advised to continue medications as directed and to follow up on Thursday (1/18) unless she is significantly improved.  - albuterol (PROVENTIL) (2.5 MG/3ML) 0.083% nebulizer solution 2.5 mg; Take 3 mLs (2.5 mg total) by nebulization once. - ipratropium (ATROVENT) nebulizer solution 0.5 mg; Take 2.5 mLs (0.5 mg total) by nebulization once. - POCT CBC  Lorella Nimrod, PA-S

## 2016-05-19 ENCOUNTER — Ambulatory Visit: Payer: BLUE CROSS/BLUE SHIELD

## 2016-07-28 ENCOUNTER — Other Ambulatory Visit: Payer: Self-pay | Admitting: Physician Assistant

## 2016-07-28 DIAGNOSIS — F411 Generalized anxiety disorder: Secondary | ICD-10-CM

## 2016-07-29 NOTE — Telephone Encounter (Signed)
Meds ordered this encounter  Medications  . LORazepam (ATIVAN) 1 MG tablet    Sig: TAKE 1/2-1 TABLET TWICE A DAY AS NEEDED.    Dispense:  20 tablet    Refill:  0    Not to exceed 5 additional fills before 07/05/2018CUSTOMER NEEDS REFILLS PLEASE. THANK YOU.

## 2016-07-29 NOTE — Telephone Encounter (Signed)
Called to [facility]. 

## 2016-08-31 ENCOUNTER — Encounter: Payer: Self-pay | Admitting: Emergency Medicine

## 2016-08-31 ENCOUNTER — Ambulatory Visit (INDEPENDENT_AMBULATORY_CARE_PROVIDER_SITE_OTHER): Payer: 59 | Admitting: Emergency Medicine

## 2016-08-31 VITALS — BP 117/80 | HR 82 | Temp 99.2°F | Resp 18 | Ht 59.0 in | Wt 121.6 lb

## 2016-08-31 DIAGNOSIS — M545 Low back pain, unspecified: Secondary | ICD-10-CM

## 2016-08-31 DIAGNOSIS — M67432 Ganglion, left wrist: Secondary | ICD-10-CM

## 2016-08-31 DIAGNOSIS — J4530 Mild persistent asthma, uncomplicated: Secondary | ICD-10-CM

## 2016-08-31 MED ORDER — TRAMADOL HCL 50 MG PO TABS: 50.0000 mg | ORAL_TABLET | Freq: Every evening | ORAL | 0 refills | Status: DC | PRN

## 2016-08-31 MED ORDER — BECLOMETHASONE DIPROPIONATE 40 MCG/ACT IN AERS: 1.0000 | INHALATION_SPRAY | Freq: Two times a day (BID) | RESPIRATORY_TRACT | 5 refills | Status: DC

## 2016-08-31 NOTE — Patient Instructions (Addendum)
   IF you received an x-ray today, you will receive an invoice from [facility]. Please contact [facility] at [phone] with questions or concerns regarding your invoice.   IF you received labwork today, you will receive an invoice from [facility]. Please contact [facility] at [phone] with questions or concerns regarding your invoice.   Our billing staff will not be able to assist you with questions regarding bills from these companies.  You will be contacted with the lab results as soon as they are available. The fastest way to get your results is to activate your My Chart account. Instructions are located on the last page of this paperwork. If you have not heard from us regarding the results in 2 weeks, please contact this office.     Asthma, Adult Asthma is a condition of the lungs in which the airways tighten and narrow. Asthma can make it hard to breathe. Asthma cannot be cured, but medicine and lifestyle changes can help control it. Asthma may be started (triggered) by:  Animal skin flakes (dander).  Dust.  Cockroaches.  Pollen.  Mold.  Smoke.  Cleaning products.  Hair sprays or aerosol sprays.  Paint fumes or strong smells.  Cold air, weather changes, and winds.  Crying or laughing hard.  Stress.  Certain medicines or drugs.  Foods, such as dried fruit, potato chips, and sparkling grape juice.  Infections or conditions (colds, flu).  Exercise.  Certain medical conditions or diseases.  Exercise or tiring activities.  Follow these instructions at home:  Take medicine as told by your doctor.  Use a peak flow meter as told by your doctor. A peak flow meter is a tool that measures how well the lungs are working.  Record and keep track of the peak flow meter's readings.  Understand and use the asthma action plan. An asthma action plan is a written plan for taking care of your asthma and treating your attacks.  To help prevent  asthma attacks: ? Do not smoke. Stay away from secondhand smoke. ? Change your heating and air conditioning filter often. ? Limit your use of fireplaces and wood stoves. ? Get rid of pests (such as roaches and mice) and their droppings. ? Throw away plants if you see mold on them. ? Clean your floors. Dust regularly. Use cleaning products that do not smell. ? Have someone vacuum when you are not home. Use a vacuum cleaner with a HEPA filter if possible. ? Replace carpet with wood, tile, or vinyl flooring. Carpet can trap animal skin flakes and dust. ? Use allergy-proof pillows, mattress covers, and box spring covers. ? Wash bed sheets and blankets every week in hot water and dry them in a dryer. ? Use blankets that are made of polyester or cotton. ? Clean bathrooms and kitchens with bleach. If possible, have someone repaint the walls in these rooms with mold-resistant paint. Keep out of the rooms that are being cleaned and painted. ? Wash hands often. Contact a doctor if:  You have make a whistling sound when breaking (wheeze), have shortness of breath, or have a cough even if taking medicine to prevent attacks.  The colored mucus you cough up (sputum) is thicker than usual.  The colored mucus you cough up changes from clear or white to yellow, green, gray, or bloody.  You have problems from the medicine you are taking such as: ? A rash. ? Itching. ? Swelling. ? Trouble breathing.  You need reliever medicines more than   2-3 times a week.  Your peak flow measurement is still at 50-79% of your personal best after following the action plan for 1 hour.  You have a fever. Get help right away if:  You seem to be worse and are not responding to medicine during an asthma attack.  You are short of breath even at rest.  You get short of breath when doing very little activity.  You have trouble eating, drinking, or talking.  You have chest pain.  You have a fast heartbeat.  Your  lips or fingernails start to turn blue.  You are light-headed, dizzy, or faint.  Your peak flow is less than 50% of your personal best. This information is not intended to replace advice given to you by your health care provider. Make sure you discuss any questions you have with your health care provider. Document Released: 10/05/2007 Document Revised: 09/24/2015 Document Reviewed: 11/15/2012 Elsevier Interactive Patient Education  2017 [employer]  Ganglion Cyst A ganglion cyst is a noncancerous, fluid-filled lump that occurs near joints or tendons. The ganglion cyst grows out of a joint or the lining of a tendon. It most often develops in the hand or wrist, but it can also develop in the shoulder, elbow, hip, knee, ankle, or foot. The round or oval ganglion cyst can be the size of a pea or larger than a grape. Increased activity may enlarge the size of the cyst because more fluid starts to build up. What are the causes? It is not known what causes a ganglion cyst to grow. However, it may be related to:  Inflammation or irritation around the joint.  An injury.  Repetitive movements or overuse.  Arthritis. What increases the risk? Risk factors include:  Being a woman.  Being age 52-50. What are the signs or symptoms? Symptoms may include:  A lump. This most often appears on the hand or wrist, but it can occur in other areas of the body.  Tingling.  Pain.  Numbness.  Muscle weakness.  Weak grip.  Less movement in a joint. How is this diagnosed? Ganglion cysts are most often diagnosed based on a physical exam. Your health care provider will feel the lump and may shine a light alongside it. If it is a ganglion cyst, a light often shines through it. Your health care provider may order an X-ray, ultrasound, or MRI to rule out other conditions. How is this treated? Ganglion cysts usually go away on their own without treatment. If pain or other symptoms are involved,  treatment may be needed. Treatment is also needed if the ganglion cyst limits your movement or if it gets infected. Treatment may include:  Wearing a brace or splint on your wrist or finger.  Taking anti-inflammatory medicine.  Draining fluid from the lump with a needle (aspiration).  Injecting a steroid into the joint.  Surgery to remove the ganglion cyst. Follow these instructions at home:  Do not press on the ganglion cyst, poke it with a needle, or hit it.  Take medicines only as directed by your health care provider.  Wear your brace or splint as directed by your health care provider.  Watch your ganglion cyst for any changes.  Keep all follow-up visits as directed by your health care provider. This is important. Contact a health care provider if:  Your ganglion cyst becomes larger or more painful.  You have increased redness, red streaks, or swelling.  You have pus coming from the lump.  You have  weakness or numbness in the affected area.  You have a fever or chills. This information is not intended to replace advice given to you by your health care provider. Make sure you discuss any questions you have with your health care provider. Document Released: 04/15/2000 Document Revised: 09/24/2015 Document Reviewed: 10/01/2013 Elsevier Interactive Patient Education  2017 [employer] Inc.  Back Pain, Adult Back pain is very common. The pain often gets better over time. The cause of back pain is usually not dangerous. Most people can learn to manage their back pain on their own. Follow these instructions at home: Watch your back pain for any changes. The following actions may help to lessen any pain you are feeling:  Stay active. Start with short walks on flat ground if you can. Try to walk farther each day.  Exercise regularly as told by your doctor. Exercise helps your back heal faster. It also helps avoid future injury by keeping your muscles strong and flexible.  Do not  sit, drive, or stand in one place for more than 30 minutes.  Do not stay in bed. Resting more than 1-2 days can slow down your recovery.  Be careful when you bend or lift an object. Use good form when lifting:  Bend at your knees.  Keep the object close to your body.  Do not twist.  Sleep on a firm mattress. Lie on your side, and bend your knees. If you lie on your back, put a pillow under your knees.  Take medicines only as told by your doctor.  Put ice on the injured area.  Put ice in a plastic bag.  Place a towel between your skin and the bag.  Leave the ice on for 20 minutes, 2-3 times a day for the first 2-3 days. After that, you can switch between ice and heat packs.  Avoid feeling anxious or stressed. Find good ways to deal with stress, such as exercise.  Maintain a healthy weight. Extra weight puts stress on your back. Contact a doctor if:  You have pain that does not go away with rest or medicine.  You have worsening pain that goes down into your legs or buttocks.  You have pain that does not get better in one week.  You have pain at night.  You lose weight.  You have a fever or chills. Get help right away if:  You cannot control when you poop (bowel movement) or pee (urinate).  Your arms or legs feel weak.  Your arms or legs lose feeling (numbness).  You feel sick to your stomach (nauseous) or throw up (vomit).  You have belly (abdominal) pain.  You feel like you may pass out (faint). This information is not intended to replace advice given to you by your health care provider. Make sure you discuss any questions you have with your health care provider. Document Released: 10/05/2007 Document Revised: 09/24/2015 Document Reviewed: 08/20/2013 Elsevier Interactive Patient Education  2017 [employer].

## 2016-08-31 NOTE — Progress Notes (Signed)
Wanda Nixon 26 y.o.   Chief Complaint  Patient presents with  . Back Pain    feels a knot in lower back x 81months  . Headache    spinal headaches x 49months  . Dizziness    due to Celexa   . Cyst    cyst on left hand comes and goes x 42months    HISTORY OF PRESENT ILLNESS: This is a 26 y.o. female complaining of low back pain on and off for several months worse today; aslo c/o cyst on left wrist; also c/o asthma out of control; using rescue inhaler almost daily; has been on Qvar before with good results. Celexa working well with her.  HPI   Prior to Admission medications   Medication Sig Start Date End Date Taking? Authorizing Provider  albuterol (PROVENTIL HFA;VENTOLIN HFA) 108 (90 Base) MCG/ACT inhaler Inhale 1-2 puffs into the lungs every 6 (six) hours as needed for wheezing or shortness of breath. 02/26/16  Yes Wanda Hams, MD  citalopram (CELEXA) 20 MG tablet Take 1 tablet (20 mg total) by mouth daily. 02/12/16  Yes Wanda Jeffery, PA-C  LORazepam (ATIVAN) 1 MG tablet TAKE 1/2-1 TABLET TWICE A DAY AS NEEDED. 07/29/16  Yes Wanda Jeffery, PA-C  benzonatate (TESSALON) 100 MG capsule Take 1 capsule (100 mg total) by mouth 3 (three) times daily as needed for cough. Patient not taking: Reported on 05/16/2016 05/10/16   Wanda Loan, PA-C  doxycycline (VIBRAMYCIN) 100 MG capsule Take 1 capsule (100 mg total) by mouth 2 (two) times daily. Patient not taking: Reported on 08/31/2016 05/12/16   Wanda Loan, PA-C  predniSONE (DELTASONE) 20 MG tablet Take 2 tablets (40 mg total) by mouth daily. Patient not taking: Reported on 08/31/2016 05/12/16   Wanda Loan, PA-C    Allergies  Allergen Reactions  . Pulmicort [Budesonide] Palpitations    Racing heart  . Tomato Hives and Itching  . Other Hives    mushrooms    Patient Active Problem List   Diagnosis Date Noted  . Asthma 02/26/2016  . Anxiety state 02/12/2016  . Migraine, unspecified, without mention of intractable migraine without  mention of status migrainosus 10/25/2013    Past Medical History:  Diagnosis Date  . Asthma    Last used 08/30/14  . Cholestasis of pregnancy in third trimester 07/25/2015  . Eczema   . Headache(784.0)   . NST (non-stress test) nonreactive   . SVD (spontaneous vaginal delivery) 07/26/2015    Past Surgical History:  Procedure Laterality Date  . MOUTH SURGERY    . TYMPANOSTOMY TUBE PLACEMENT    . WISDOM TOOTH EXTRACTION      Social History   Social History  . Marital status: Married    Spouse name: Wanda Nixon  . Number of children: 2  . Years of education: college   Occupational History  . 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 Smoking status: Never Smoker  . Smokeless tobacco: Never Used  . Alcohol use No  . Drug use: No  . Sexual activity: Yes    Birth control/ protection: None   Other Topics Concern  . Not on file   Social History Narrative   Patient lives at home with her husband and their 2 children.    Education two years of college.   Right handed.   Caffeine - None     Family History  Problem Relation Age of Onset  . Diabetes Maternal Grandmother   . Hypertension Maternal Grandmother   .  Stroke Maternal Grandmother   . High blood pressure Mother   . Hypertension Mother      Review of Systems  Constitutional: Negative.  Negative for chills, fever and weight loss.  HENT: Negative.   Eyes: Negative.   Respiratory: Positive for shortness of breath and wheezing.   Cardiovascular: Negative.  Negative for chest pain, palpitations and leg swelling.  Gastrointestinal: Negative.  Negative for abdominal pain, diarrhea, nausea and vomiting.  Genitourinary: Negative.  Negative for dysuria and hematuria.  Musculoskeletal: Positive for back pain and joint pain (left wrist).  Skin: Negative.  Negative for rash.  Neurological: Positive for dizziness and headaches.  Endo/Heme/Allergies: Negative.   All other systems reviewed and are  negative.  Vitals:   08/31/16 1019  BP: 117/80  Pulse: 82  Resp: 18  Temp: 99.2 F (37.3 C)     Physical Exam  Constitutional: She is oriented to person, place, and time. She appears well-developed and well-nourished.  HENT:  Head: Normocephalic and atraumatic.  Nose: Nose normal.  Mouth/Throat: Oropharynx is clear and moist. No oropharyngeal exudate.  Eyes: Conjunctivae and EOM are normal. Pupils are equal, round, and reactive to light.  Neck: Normal range of motion. Neck supple. No JVD present. No thyromegaly present.  Cardiovascular: Normal rate, regular rhythm and normal heart sounds.   Pulmonary/Chest: Effort normal and breath sounds normal.  Abdominal: Soft. Bowel sounds are normal. She exhibits no distension. There is no tenderness.  Musculoskeletal: She exhibits no edema.       Lumbar back: She exhibits decreased range of motion, tenderness and pain. She exhibits no bony tenderness and no spasm.  Left wrist: +ganglionic cyst  Lymphadenopathy:    She has no cervical adenopathy.  Neurological: She is alert and oriented to person, place, and time. She displays normal reflexes. No sensory deficit. She exhibits normal muscle tone.  Skin: Skin is warm and dry. Capillary refill takes less than 2 seconds. No rash noted.  Psychiatric: She has a normal mood and affect. Her behavior is normal.  Vitals reviewed.    ASSESSMENT & PLAN: Wanda Nixon was seen today for back pain, headache, dizziness and cyst.  Diagnoses and all orders for this visit:  Acute bilateral low back pain without sciatica -     traMADol (ULTRAM) 50 MG tablet; Take 1 tablet (50 mg total) by mouth at bedtime as needed.  Ganglion cyst of dorsum of left wrist  Mild persistent asthma, unspecified whether complicated  Other orders -     beclomethasone (QVAR) 40 MCG/ACT inhaler; Inhale 1 puff into the lungs 2 (two) times daily at 10 AM and 5 PM.    Patient Instructions       IF you received an x-ray  today, you will receive an invoice from [facility]. Please contact [facility] at [phone] with questions or concerns regarding your invoice.   IF you received labwork today, you will receive an invoice from [facility]. Please contact [facility] at [phone] with questions or concerns regarding your invoice.   Our billing staff will not be able to assist you with questions regarding bills from these companies.  You will be contacted with the lab results as soon as they are available. The fastest way to get your results is to activate your My Chart account. Instructions are located on the last page of this paperwork. If you have not heard from [place] regarding the results in 2 weeks, please contact this office.      Asthma, Adult Asthma is  a condition of the lungs in which the airways tighten and narrow. Asthma can make it hard to breathe. Asthma cannot be cured, but medicine and lifestyle changes can help control it. Asthma may be started (triggered) by:  Animal skin flakes (dander).  Dust.  Cockroaches.  Pollen.  Mold.  Smoke.  Cleaning products.  Hair sprays or aerosol sprays.  Paint fumes or strong smells.  Cold air, weather changes, and winds.  Crying or laughing hard.  Stress.  Certain medicines or drugs.  Foods, such as dried fruit, potato chips, and sparkling grape juice.  Infections or conditions (colds, flu).  Exercise.  Certain medical conditions or diseases.  Exercise or tiring activities. Follow these instructions at home:  Take medicine as told by your doctor.  Use a peak flow meter as told by your doctor. A peak flow meter is a tool that measures how well the lungs are working.  Record and keep track of the peak flow meter's readings.  Understand and use the asthma action plan. An asthma action plan is a written plan for taking care of your asthma and treating your attacks.  To help prevent asthma attacks:  Do not smoke.  Stay away from secondhand smoke.  Change your heating and air conditioning filter often.  Limit your use of fireplaces and wood stoves.  Get rid of pests (such as roaches and mice) and their droppings.  Throw away plants if you see mold on them.  Clean your floors. Dust regularly. Use cleaning products that do not smell.  Have someone vacuum when you are not home. Use a vacuum cleaner with a HEPA filter if possible.  Replace carpet with wood, tile, or vinyl flooring. Carpet can trap animal skin flakes and dust.  Use allergy-proof pillows, mattress covers, and box spring covers.  Wash bed sheets and blankets every week in hot water and dry them in a dryer.  Use blankets that are made of polyester or cotton.  Clean bathrooms and kitchens with bleach. If possible, have someone repaint the walls in these rooms with mold-resistant paint. Keep out of the rooms that are being cleaned and painted.  Wash hands often. Contact a doctor if:  You have make a whistling sound when breaking (wheeze), have shortness of breath, or have a cough even if taking medicine to prevent attacks.  The colored mucus you cough up (sputum) is thicker than usual.  The colored mucus you cough up changes from clear or white to yellow, green, gray, or bloody.  You have problems from the medicine you are taking such as:  A rash.  Itching.  Swelling.  Trouble breathing.  You need reliever medicines more than 2-3 times a week.  Your peak flow measurement is still at 50-79% of your personal best after following the action plan for 1 hour.  You have a fever. Get help right away if:  You seem to be worse and are not responding to medicine during an asthma attack.  You are short of breath even at rest.  You get short of breath when doing very little activity.  You have trouble eating, drinking, or talking.  You have chest pain.  You have a fast heartbeat.  Your lips or fingernails start to turn  blue.  You are light-headed, dizzy, or faint.  Your peak flow is less than 50% of your personal best. This information is not intended to replace advice given to you by your health care provider. Make sure you discuss any  questions you have with your health care provider. Document Released: 10/05/2007 Document Revised: 09/24/2015 Document Reviewed: 11/15/2012 Elsevier Interactive Patient Education  2017 Elsevier Inc.  Ganglion Cyst A ganglion cyst is a noncancerous, fluid-filled lump that occurs near joints or tendons. The ganglion cyst grows out of a joint or the lining of a tendon. It most often develops in the hand or wrist, but it can also develop in the shoulder, elbow, hip, knee, ankle, or foot. The round or oval ganglion cyst can be the size of a pea or larger than a grape. Increased activity may enlarge the size of the cyst because more fluid starts to build up. What are the causes? It is not known what causes a ganglion cyst to grow. However, it may be related to:  Inflammation or irritation around the joint.  An injury.  Repetitive movements or overuse.  Arthritis. What increases the risk? Risk factors include:  Being a woman.  Being age 16-50. What are the signs or symptoms? Symptoms may include:  A lump. This most often appears on the hand or wrist, but it can occur in other areas of the body.  Tingling.  Pain.  Numbness.  Muscle weakness.  Weak grip.  Less movement in a joint. How is this diagnosed? Ganglion cysts are most often diagnosed based on a physical exam. Your health care provider will feel the lump and may shine a light alongside it. If it is a ganglion cyst, a light often shines through it. Your health care provider may order an X-ray, ultrasound, or MRI to rule out other conditions. How is this treated? Ganglion cysts usually go away on their own without treatment. If pain or other symptoms are involved, treatment may be needed. Treatment is  also needed if the ganglion cyst limits your movement or if it gets infected. Treatment may include:  Wearing a brace or splint on your wrist or finger.  Taking anti-inflammatory medicine.  Draining fluid from the lump with a needle (aspiration).  Injecting a steroid into the joint.  Surgery to remove the ganglion cyst. Follow these instructions at home:  Do not press on the ganglion cyst, poke it with a needle, or hit it.  Take medicines only as directed by your health care provider.  Wear your brace or splint as directed by your health care provider.  Watch your ganglion cyst for any changes.  Keep all follow-up visits as directed by your health care provider. This is important. Contact a health care provider if:  Your ganglion cyst becomes larger or more painful.  You have increased redness, red streaks, or swelling.  You have pus coming from the lump.  You have weakness or numbness in the affected area.  You have a fever or chills. This information is not intended to replace advice given to you by your health care provider. Make sure you discuss any questions you have with your health care provider. Document Released: 04/15/2000 Document Revised: 09/24/2015 Document Reviewed: 10/01/2013 Elsevier Interactive Patient Education  2017 Elsevier Inc.  Back Pain, Adult Back pain is very common. The pain often gets better over time. The cause of back pain is usually not dangerous. Most people can learn to manage their back pain on their own. Follow these instructions at home: Watch your back pain for any changes. The following actions may help to lessen any pain you are feeling:  Stay active. Start with short walks on flat ground if you can. Try to walk farther each day.  Exercise regularly as told by your doctor. Exercise helps your back heal faster. It also helps avoid future injury by keeping your muscles strong and flexible.  Do not sit, drive, or stand in one place for  more than 30 minutes.  Do not stay in bed. Resting more than 1-2 days can slow down your recovery.  Be careful when you bend or lift an object. Use good form when lifting:  Bend at your knees.  Keep the object close to your body.  Do not twist.  Sleep on a firm mattress. Lie on your side, and bend your knees. If you lie on your back, put a pillow under your knees.  Take medicines only as told by your doctor.  Put ice on the injured area.  Put ice in a plastic bag.  Place a towel between your skin and the bag.  Leave the ice on for 20 minutes, 2-3 times a day for the first 2-3 days. After that, you can switch between ice and heat packs.  Avoid feeling anxious or stressed. Find good ways to deal with stress, such as exercise.  Maintain a healthy weight. Extra weight puts stress on your back. Contact a doctor if:  You have pain that does not go away with rest or medicine.  You have worsening pain that goes down into your legs or buttocks.  You have pain that does not get better in one week.  You have pain at night.  You lose weight.  You have a fever or chills. Get help right away if:  You cannot control when you poop (bowel movement) or pee (urinate).  Your arms or legs feel weak.  Your arms or legs lose feeling (numbness).  You feel sick to your stomach (nauseous) or throw up (vomit).  You have belly (abdominal) pain.  You feel like you may pass out (faint). This information is not intended to replace advice given to you by your health care provider. Make sure you discuss any questions you have with your health care provider. Document Released: 10/05/2007 Document Revised: 09/24/2015 Document Reviewed: 08/20/2013 Elsevier Interactive Patient Education  2017 Elsevier Inc.      Agustina Caroli, MD [facility]

## 2016-09-01 ENCOUNTER — Ambulatory Visit: Payer: BLUE CROSS/BLUE SHIELD

## 2016-09-03 ENCOUNTER — Other Ambulatory Visit: Payer: Self-pay | Admitting: Physician Assistant

## 2016-09-03 DIAGNOSIS — F411 Generalized anxiety disorder: Secondary | ICD-10-CM

## 2016-09-23 ENCOUNTER — Ambulatory Visit: Payer: 59 | Admitting: Family Medicine

## 2016-10-10 ENCOUNTER — Ambulatory Visit: Payer: Self-pay

## 2016-10-10 ENCOUNTER — Encounter: Payer: Self-pay | Admitting: General Practice

## 2016-10-10 ENCOUNTER — Other Ambulatory Visit: Payer: Self-pay | Admitting: Physician Assistant

## 2016-10-10 DIAGNOSIS — F411 Generalized anxiety disorder: Secondary | ICD-10-CM

## 2016-10-10 DIAGNOSIS — Z3200 Encounter for pregnancy test, result unknown: Secondary | ICD-10-CM

## 2016-10-10 LAB — POCT PREGNANCY, URINE: Preg Test, Ur: POSITIVE — AB

## 2016-10-10 NOTE — Telephone Encounter (Signed)
Please call patient. Rx authorized. Needs to schedule follow-up.  Meds ordered this encounter  Medications  . citalopram (CELEXA) 20 MG tablet    Sig: TAKE 1 TABLET BY MOUTH DAILY DUE FOR OFFICE VISIT    Dispense:  30 tablet    Refill:  0

## 2016-10-10 NOTE — Progress Notes (Signed)
Patient presented to office today for a pregnancy test. Test confirms she is pregnant at this time around 5 weeks. Patient has been advised to start taking prenatal vitamins at this time and to always call before taking anything else. Patient voice understanding at this time.

## 2016-10-18 ENCOUNTER — Inpatient Hospital Stay (HOSPITAL_COMMUNITY): Payer: 59

## 2016-10-18 ENCOUNTER — Encounter (HOSPITAL_COMMUNITY): Payer: Self-pay | Admitting: *Deleted

## 2016-10-18 ENCOUNTER — Inpatient Hospital Stay (HOSPITAL_COMMUNITY)
Admission: AD | Admit: 2016-10-18 | Discharge: 2016-10-18 | Disposition: A | Discharge status: Home / Self Care | Payer: 59 | Source: Ambulatory Visit | Attending: Obstetrics and Gynecology | Admitting: Obstetrics and Gynecology

## 2016-10-18 ENCOUNTER — Inpatient Hospital Stay (EMERGENCY_DEPARTMENT_HOSPITAL)
Admission: AD | Admit: 2016-10-18 | Discharge: 2016-10-18 | Disposition: A | Discharge status: Home / Self Care | Payer: 59 | Source: Ambulatory Visit | Attending: Obstetrics and Gynecology | Admitting: Obstetrics and Gynecology

## 2016-10-18 DIAGNOSIS — O99342 Other mental disorders complicating pregnancy, second trimester: Secondary | ICD-10-CM | POA: Insufficient documentation

## 2016-10-18 DIAGNOSIS — O26891 Other specified pregnancy related conditions, first trimester: Secondary | ICD-10-CM | POA: Diagnosis not present

## 2016-10-18 DIAGNOSIS — N730 Acute parametritis and pelvic cellulitis: Secondary | ICD-10-CM | POA: Diagnosis not present

## 2016-10-18 DIAGNOSIS — K2960 Other gastritis without bleeding: Secondary | ICD-10-CM

## 2016-10-18 DIAGNOSIS — F411 Generalized anxiety disorder: Secondary | ICD-10-CM | POA: Diagnosis not present

## 2016-10-18 DIAGNOSIS — Z3A01 Less than 8 weeks gestation of pregnancy: Secondary | ICD-10-CM | POA: Insufficient documentation

## 2016-10-18 DIAGNOSIS — O9989 Other specified diseases and conditions complicating pregnancy, childbirth and the puerperium: Secondary | ICD-10-CM | POA: Diagnosis not present

## 2016-10-18 DIAGNOSIS — J45909 Unspecified asthma, uncomplicated: Secondary | ICD-10-CM | POA: Insufficient documentation

## 2016-10-18 DIAGNOSIS — O99511 Diseases of the respiratory system complicating pregnancy, first trimester: Secondary | ICD-10-CM | POA: Insufficient documentation

## 2016-10-18 DIAGNOSIS — O99512 Diseases of the respiratory system complicating pregnancy, second trimester: Secondary | ICD-10-CM | POA: Diagnosis not present

## 2016-10-18 DIAGNOSIS — Z79899 Other long term (current) drug therapy: Secondary | ICD-10-CM | POA: Insufficient documentation

## 2016-10-18 DIAGNOSIS — O209 Hemorrhage in early pregnancy, unspecified: Secondary | ICD-10-CM | POA: Diagnosis not present

## 2016-10-18 DIAGNOSIS — R109 Unspecified abdominal pain: Secondary | ICD-10-CM

## 2016-10-18 DIAGNOSIS — Z3491 Encounter for supervision of normal pregnancy, unspecified, first trimester: Secondary | ICD-10-CM

## 2016-10-18 DIAGNOSIS — K2970 Gastritis, unspecified, without bleeding: Secondary | ICD-10-CM | POA: Insufficient documentation

## 2016-10-18 LAB — URINALYSIS, ROUTINE W REFLEX MICROSCOPIC
BILIRUBIN URINE: NEGATIVE
GLUCOSE, UA: NEGATIVE mg/dL
Hgb urine dipstick: NEGATIVE
KETONES UR: NEGATIVE mg/dL
Leukocytes, UA: NEGATIVE
Nitrite: NEGATIVE
PH: 6 (ref 5.0–8.0)
Protein, ur: NEGATIVE mg/dL
Specific Gravity, Urine: 1.026 (ref 1.005–1.030)

## 2016-10-18 LAB — CBC
HEMATOCRIT: 37.2 % (ref 36.0–46.0)
Hemoglobin: 12.9 g/dL (ref 12.0–15.0)
MCH: 28.8 pg (ref 26.0–34.0)
MCHC: 34.7 g/dL (ref 30.0–36.0)
MCV: 83 fL (ref 78.0–100.0)
Platelets: 335 10*3/uL (ref 150–400)
RBC: 4.48 MIL/uL (ref 3.87–5.11)
RDW: 14.3 % (ref 11.5–15.5)
WBC: 5.1 10*3/uL (ref 4.0–10.5)

## 2016-10-18 LAB — POCT PREGNANCY, URINE: Preg Test, Ur: POSITIVE — AB

## 2016-10-18 LAB — WET PREP, GENITAL
CLUE CELLS WET PREP: NONE SEEN
Sperm: NONE SEEN
Trich, Wet Prep: NONE SEEN
Yeast Wet Prep HPF POC: NONE SEEN

## 2016-10-18 LAB — HCG, QUANTITATIVE, PREGNANCY: hCG, Beta Chain, Quant, S: 31091 m[IU]/mL — ABNORMAL HIGH (ref <5)

## 2016-10-18 MED ORDER — PROMETHAZINE HCL 25 MG/ML IJ SOLN
12.5000 mg | Freq: Once | INTRAMUSCULAR | Status: AC
  Administered 2016-10-18: 12.5 mg via INTRAMUSCULAR
  Filled 2016-10-18: qty 1

## 2016-10-18 MED ORDER — CEFTRIAXONE SODIUM 250 MG IJ SOLR
250.0000 mg | Freq: Once | INTRAMUSCULAR | Status: AC
  Administered 2016-10-18: 250 mg via INTRAMUSCULAR
  Filled 2016-10-18: qty 250

## 2016-10-18 MED ORDER — PROMETHAZINE HCL 25 MG PO TABS: 25.0000 mg | ORAL_TABLET | Freq: Four times a day (QID) | ORAL | 1 refills | Status: DC | PRN

## 2016-10-18 MED ORDER — MORPHINE SULFATE (PF) 4 MG/ML IV SOLN
2.0000 mg | Freq: Once | INTRAVENOUS | Status: AC
  Administered 2016-10-18: 2 mg via INTRAMUSCULAR
  Filled 2016-10-18: qty 1

## 2016-10-18 MED ORDER — CONCEPT OB 130-92.4-1 MG PO CAPS: 1.0000 | ORAL_CAPSULE | Freq: Every day | ORAL | 12 refills | Status: DC

## 2016-10-18 MED ORDER — AZITHROMYCIN 250 MG PO TABS
1000.0000 mg | ORAL_TABLET | Freq: Once | ORAL | Status: AC
  Administered 2016-10-18: 1000 mg via ORAL
  Filled 2016-10-18: qty 4

## 2016-10-18 MED ORDER — CITALOPRAM HYDROBROMIDE 20 MG PO TABS: 20.0000 mg | ORAL_TABLET | Freq: Every day | ORAL | 0 refills | Status: DC

## 2016-10-18 NOTE — MAU Provider Note (Signed)
History     CSN: [id number]  Arrival date and time: 10/18/16 1348   First Provider Initiated Contact with Patient 10/18/16 1404      Chief Complaint  Patient presents with  . Emesis   HPI Wanda Nixon is a 26 y.o. [id number] at [redacted]w[redacted]d who presents stating after she left MAU, she began to have nausea and vomiting. She states she threw up 5 times and had dry heaving also. She thinks it is from the medication she was given before discharge. She has not eaten any food today and has had no episodes of vomiting since returning to [facility]U. She denies pain, vaginal bleeding or discharge.   OB History    Gravida Para Term Preterm AB Living   4 2 2     2    SAB TAB Ectopic Multiple Live Births         0 2      Past Medical History:  Diagnosis Date  . Asthma    Last used 08/30/14; weekly inhaler use  . Cholestasis of pregnancy in third trimester 07/25/2015  . Eczema   . Headache(784.0)    migraines  . NST (non-stress test) nonreactive   . SVD (spontaneous vaginal delivery) 07/26/2015    Past Surgical History:  Procedure Laterality Date  . MOUTH SURGERY    . TYMPANOSTOMY TUBE PLACEMENT    . WISDOM TOOTH EXTRACTION      Family History  Problem Relation Age of Onset  . Diabetes Maternal Grandmother   . Hypertension Maternal Grandmother   . Stroke Maternal Grandmother   . High blood pressure Mother   . Hypertension Mother     Social History  Substance Use Topics  . Smoking status: Never Smoker  . Smokeless tobacco: Never Used  . Alcohol use No    Allergies:  Allergies  Allergen Reactions  . Pulmicort [Budesonide] Palpitations    Racing heart  . Tomato Hives and Itching  . Other Hives    mushrooms    Prescriptions Prior to Admission  Medication Sig Dispense Refill Last Dose  . citalopram (CELEXA) 20 MG tablet Take 1 tablet (20 mg total) by mouth daily. 30 tablet 0 10/17/2016 at Unknown time  . diphenhydramine-acetaminophen (TYLENOL PM) 25-500 MG TABS tablet Take 1  tablet by mouth at bedtime as needed (sleep).   Past Week at Unknown time  . diphenhydrAMINE (BENADRYL) 25 MG tablet Take 25 mg by mouth every 6 (six) hours as needed for allergies.    prn  . Prenat w/o A Vit-FeFum-FePo-FA (CONCEPT OB) 130-92.4-1 MG CAPS Take 1 tablet by mouth daily. 30 capsule 12 not started    Review of Systems  Constitutional: Negative.  Negative for chills and fever.  Respiratory: Negative.  Negative for shortness of breath.   Cardiovascular: Negative.  Negative for chest pain.  Gastrointestinal: Positive for nausea and vomiting. Negative for abdominal pain.  Genitourinary: Negative.  Negative for vaginal bleeding and vaginal discharge.  Musculoskeletal: Negative.   Neurological: Negative.    Physical Exam   Blood pressure 107/68, pulse 98, temperature 97.3 F (36.3 C), temperature source Oral, resp. rate 18, last menstrual period 09/02/2016, SpO2 100 %, not currently breastfeeding.  Physical Exam  Nursing note and vitals reviewed. Constitutional: She is oriented to person, place, and time. She appears well-developed and well-nourished.  HENT:  Head: Normocephalic and atraumatic.  Eyes: Conjunctivae are normal. No scleral icterus.  Cardiovascular: Normal rate, regular rhythm and normal heart sounds.   Respiratory: Effort normal  and breath sounds normal. No respiratory distress.  GI: Soft. There is no tenderness.  Neurological: She is alert and oriented to person, place, and time.  Skin: Skin is warm and dry.  Psychiatric: She has a normal mood and affect. Her behavior is normal. Judgment and thought content normal.    MAU Course  Procedures  MDM Phenergan 12.5 IM- patient reports relief No episodes of vomiting while in MAU  Assessment and Plan   1. Gastritis, allergic reaction    Discharge patient home in stable condition. Encouraged patient to eat small meals, BRAT diet until symptoms resolve Encouraged to return here or to other Urgent Care/ED if  she develops worsening of symptoms, increase in pain, fever, or other concerning symptoms.   Len Blalock SNM 10/18/2016, 2:08 PM    I was present for the exam and agree with above. N/V after taking Zithromax on an empty stomach. Controlled w/ Phenergan. Pt tolerating solids. No evidence of allergic reaction.   Tamala Julian, [place], [place] 10/20/2016 1:34 PM

## 2016-10-18 NOTE — MAU Note (Signed)
Patient returns to mau with c/o of allergic reaction to medication she was given at prior visit. States she has not stopped vomiting since she left.

## 2016-10-18 NOTE — Discharge Instructions (Signed)

## 2016-10-18 NOTE — MAU Note (Signed)
+  abdominal and back cramping x1 week Rating pain 8-9/10 Has tried tylenol with no relief; none today  +nausea/vomiting 8-9 emesis episodes in past 24 hours States is not able to hold anything down to eat or drink  +vaginal spotting  x1 week Dark red in color   LMP 09/02/16 Planning on going to [facility]; awaiting insurance

## 2016-10-18 NOTE — Progress Notes (Signed)
Marlou Porch CNM in to discuss d/c plan. Written and verbal d/c instructions given and understanding voiced.

## 2016-10-18 NOTE — MAU Provider Note (Signed)
History     CSN: [id number]  Arrival date and time: 10/18/16 7494   First Provider Initiated Contact with Patient 10/18/16 1001      Chief Complaint  Patient presents with  . Abdominal Pain  . Back Pain  . Possible Pregnancy  . Nausea  . Emesis  . Vaginal Bleeding   HPI Wanda Nixon is a 26 y.o. [id number] at [redacted]w[redacted]d who presents with right lower quadrant pain for 1 week that has continued to get worse. She rates the pain a 9/10 and has not tried anything for the pain. She reports spotting that started a week ago and was pink, but this morning it was bright red when she wipes. She denies vaginal discharge. Has not been seen anywhere in this pregnancy yet.  OB History    Gravida Para Term Preterm AB Living   4 2 2     2    SAB TAB Ectopic Multiple Live Births         0 2      Past Medical History:  Diagnosis Date  . Asthma    Last used 08/30/14; weekly inhaler use  . Cholestasis of pregnancy in third trimester 07/25/2015  . Eczema   . Headache(784.0)    migraines  . NST (non-stress test) nonreactive   . SVD (spontaneous vaginal delivery) 07/26/2015    Past Surgical History:  Procedure Laterality Date  . MOUTH SURGERY    . TYMPANOSTOMY TUBE PLACEMENT    . WISDOM TOOTH EXTRACTION      Family History  Problem Relation Age of Onset  . Diabetes Maternal Grandmother   . Hypertension Maternal Grandmother   . Stroke Maternal Grandmother   . High blood pressure Mother   . Hypertension Mother     Social History  Substance Use Topics  . Smoking status: Never Smoker  . Smokeless tobacco: Never Used  . Alcohol use No    Allergies:  Allergies  Allergen Reactions  . Pulmicort [Budesonide] Palpitations    Racing heart  . Tomato Hives and Itching  . Other Hives    mushrooms    Prescriptions Prior to Admission  Medication Sig Dispense Refill Last Dose  . citalopram (CELEXA) 20 MG tablet Take 20 mg by mouth daily.   10/17/2016 at Unknown time  . diphenhydrAMINE  (BENADRYL) 25 MG tablet Take 25 mg by mouth every 6 (six) hours as needed for allergies.    prn  . diphenhydramine-acetaminophen (TYLENOL PM) 25-500 MG TABS tablet Take 1 tablet by mouth at bedtime as needed (sleep).   Past Week at Unknown time  . beclomethasone (QVAR) 40 MCG/ACT inhaler Inhale 1 puff into the lungs 2 (two) times daily at 10 AM and 5 PM. 1 Inhaler 5   . traMADol (ULTRAM) 50 MG tablet Take 1 tablet (50 mg total) by mouth at bedtime as needed. 20 tablet 0     Review of Systems  Constitutional: Positive for appetite change. Negative for fatigue and fever.  Respiratory: Negative.  Negative for shortness of breath.   Cardiovascular: Negative.  Negative for chest pain.  Gastrointestinal: Positive for abdominal pain, nausea and vomiting. Negative for constipation and diarrhea.  Genitourinary: Positive for vaginal bleeding.  Neurological: Negative.  Negative for headaches.   Physical Exam   Blood pressure 109/70, pulse 84, temperature 98 F (36.7 C), temperature source Oral, resp. rate 17, weight 129 lb 1.9 oz (58.6 kg), last menstrual period 09/02/2016, SpO2 100 %, not currently breastfeeding.  Physical Exam  Nursing note and vitals reviewed. Constitutional: She appears well-developed and well-nourished.  HENT:  Head: Normocephalic and atraumatic.  Eyes: Conjunctivae are normal. No scleral icterus.  Cardiovascular: Normal rate, regular rhythm and normal heart sounds.   Respiratory: Effort normal and breath sounds normal. No respiratory distress.  GI: Soft. She exhibits no distension. There is tenderness.  Genitourinary: Cervix exhibits motion tenderness and friability.  Neurological: She is alert.  Skin: Skin is warm and dry.  Psychiatric: She has a normal mood and affect. Her behavior is normal. Judgment and thought content normal.   Pelvic exam: Cervix pink, visually closed, friability noted, scant white creamy discharge, vaginal walls and external genitalia  normal Bimanual exam: Cervix 0/long/high, firm, anterior, positive CMT, uterus nontender, adnexa tender, enlargement, or mass  MAU Course  Procedures Results for orders placed or performed during the hospital encounter of 10/18/16 (from the past 24 hour(s))  Urinalysis, Routine w reflex microscopic     Status: None   Collection Time: 10/18/16  9:28 AM  Result Value Ref Range   Color, Urine YELLOW YELLOW   APPearance CLEAR CLEAR   Specific Gravity, Urine 1.026 1.005 - 1.030   pH 6.0 5.0 - 8.0   Glucose, UA NEGATIVE NEGATIVE mg/dL   Hgb urine dipstick NEGATIVE NEGATIVE   Bilirubin Urine NEGATIVE NEGATIVE   Ketones, ur NEGATIVE NEGATIVE mg/dL   Protein, ur NEGATIVE NEGATIVE mg/dL   Nitrite NEGATIVE NEGATIVE   Leukocytes, UA NEGATIVE NEGATIVE  Pregnancy, urine POC     Status: Abnormal   Collection Time: 10/18/16  9:37 AM  Result Value Ref Range   Preg Test, Ur POSITIVE (A) NEGATIVE  hCG, quantitative, pregnancy     Status: Abnormal   Collection Time: 10/18/16 10:04 AM  Result Value Ref Range   hCG, Beta Chain, Quant, S 31,091 (H) <5 mIU/mL  CBC     Status: None   Collection Time: 10/18/16 10:04 AM  Result Value Ref Range   WBC 5.1 4.0 - 10.5 K/uL   RBC 4.48 3.87 - 5.11 MIL/uL   Hemoglobin 12.9 12.0 - 15.0 g/dL   HCT 37.2 36.0 - 46.0 %   MCV 83.0 78.0 - 100.0 fL   MCH 28.8 26.0 - 34.0 pg   MCHC 34.7 30.0 - 36.0 g/dL   RDW 14.3 11.5 - 15.5 %   Platelets 335 150 - 400 K/uL  Wet prep, genital     Status: Abnormal   Collection Time: 10/18/16 10:50 AM  Result Value Ref Range   Yeast Wet Prep HPF POC NONE SEEN NONE SEEN   Trich, Wet Prep NONE SEEN NONE SEEN   Clue Cells Wet Prep HPF POC NONE SEEN NONE SEEN   WBC, Wet Prep HPF POC FEW (A) NONE SEEN   Sperm NONE SEEN    US Ob Comp Less 14 Wks  Result Date: 10/18/2016 CLINICAL DATA:  bleeding, cramping for 1 week EXAM: OBSTETRIC <14 WK [place] AND TRANSVAGINAL OB US TECHNIQUE: Both transabdominal and transvaginal ultrasound  examinations were performed for complete evaluation of the gestation as well as the maternal uterus, adnexal regions, and pelvic cul-de-sac. Transvaginal technique was performed to assess early pregnancy. COMPARISON:  None. FINDINGS: Intrauterine gestational sac: Single Yolk sac:  Visualized Embryo:  Visualized Cardiac Activity: Visualized Heart Rate: 104  bpm MSD:   mm    w     d CRL:  4.1  mm   6 w   1 d  [place] EDC: 06/12/2017 Subchorionic hemorrhage:  None visualized. Maternal uterus/adnexae: No adnexal mass. Trace free fluid in the pelvis. IMPRESSION: Six week 1 day intrauterine pregnancy. Fetal heart rate 104 beats per minute. No subchorionic hemorrhage. Electronically Signed   By: Rolm Baptise M.D.   On: 10/18/2016 11:43   US Ob Transvaginal  Result Date: 10/18/2016 CLINICAL DATA:  bleeding, cramping for 1 week EXAM: OBSTETRIC <14 WK [place] AND TRANSVAGINAL OB US TECHNIQUE: Both transabdominal and transvaginal ultrasound examinations were performed for complete evaluation of the gestation as well as the maternal uterus, adnexal regions, and pelvic cul-de-sac. Transvaginal technique was performed to assess early pregnancy. COMPARISON:  None. FINDINGS: Intrauterine gestational sac: Single Yolk sac:  Visualized Embryo:  Visualized Cardiac Activity: Visualized Heart Rate: 104  bpm MSD:   mm    w     d CRL:  4.1  mm   6 w   1 d                  [place] EDC: 06/12/2017 Subchorionic hemorrhage:  None visualized. Maternal uterus/adnexae: No adnexal mass. Trace free fluid in the pelvis. IMPRESSION: Six week 1 day intrauterine pregnancy. Fetal heart rate 104 beats per minute. No subchorionic hemorrhage. Electronically Signed   By: Rolm Baptise M.D.   On: 10/18/2016 11:43    MDM UA, UPT CBC, quant, HIV Wet prep and gc/chlamydia U/S OB Transvaginal U/S OB Comp Less 14 Wks 2mg  morphine IM  U/S shows IUP at [redacted]w[redacted]d with a HR 104bpm Low suspicion for appendicitis due to location of pain and absence of fever,  leukocytosis and GI complaints.  Due to patient discomfort during exam, discussed with patient treatment for PID. Patient agreeable to plan of care.  Azithromycin 1g and ceftriaxone 250mg  IM given in MAU  Assessment and Plan   1. Vaginal bleeding in pregnancy, first trimester   2. PID (acute pelvic inflammatory disease)   3. Anxiety state    -Discharge patient home in stable condition.  -Encouraged patient to schedule an appointment for prenatal care, plans to go to [facility] -Reviewed precautions for appendicitis, although low suspicion at this time.  -Encouraged to return here or to other Urgent Care/ED if she develops worsening of symptoms, increase in pain, fever, or other concerning symptoms.   Len Blalock SNM 10/18/2016, 12:15 PM   CNM attestation:  I have seen and examined this patient and agree with above documentation in the student Midwife's note.   Wanda Nixon is a 26 y.o. G6P2002 female reporting RLQ pain.  Associated symptoms:  Denies fever and chills reports abdominal pain Denies vaginal bleeding Denies vaginal discharge Denies urinary complaints Denies GI complaints  PE: See above  Gen: calm mildly uncomfortable Resp: normal effort, no distress Heart: Regular rate Abd: Soft, NT, Pos BS x 4 Neuro: A&O x 4 Pelvic exam: NEFG, Physiologic, odorless discharge, no vaginal bleeding. Uterus top-normal size, No adnexal masses or tenderness. Positive cervical motion tenderness.  ROS, labs, PMH reviewed  Rx for possible PID in MAU.  Low suspicion for appendicitis due to location of pain and absence of fever, leukocytosis and GI complaints.   Plan: - Discharge home in stable condition. - First trimester precautions. - Appendicitis precautions - Start prenatal care - Return to maternity admissions symptoms worsen  Tamala Julian [place], [place] 10/20/2016 8:59 AM

## 2016-10-18 NOTE — Discharge Instructions (Signed)
Pelvic Inflammatory Disease Pelvic inflammatory disease (PID) is an infection in some or all of the female organs. PID can be in the uterus, ovaries, fallopian tubes, or the surrounding tissues that are inside the lower belly area (pelvis). PID can lead to lasting problems if it is not treated. To check for this disease, your doctor may:  Do a physical exam.  Do blood tests, urine tests, or a pregnancy test.  Look at your vaginal discharge.  Do tests to look inside the pelvis.  Test you for other infections.  Follow these instructions at home:  Take over-the-counter and prescription medicines only as told by your doctor.  If you were prescribed an antibiotic medicine, take it as told by your doctor. Do not stop taking it even if you start to feel better.  Do not have sex until treatment is done or as told by your doctor.  Tell your sex partner if you have PID. Your partner may need to be treated.  Keep all follow-up visits as told by your doctor. This is important.  Your doctor may test you for infection again 3 months after you are treated. Contact a doctor if:  You have more fluid (discharge) coming from your vagina or fluid that is not normal.  Your pain does not improve.  You throw up (vomit).  You have a fever.  You cannot take your medicines.  Your partner has a sexually transmitted disease (STD).  You have pain when you pee (urinate). Get help right away if:  You have more belly (abdominal) or lower belly pain.  You have chills.  You are not better after 72 hours. This information is not intended to replace advice given to you by your health care provider. Make sure you discuss any questions you have with your health care provider. Document Released: 07/15/2008 Document Revised: 09/24/2015 Document Reviewed: 05/26/2014 Elsevier Interactive Patient Education  2018 [employer].   Vaginal Bleeding During Pregnancy, First Trimester A small amount of bleeding  (spotting) from the vagina is common in early pregnancy. Sometimes the bleeding is normal and is not a problem, and sometimes it is a sign of something serious. Be sure to tell your doctor about any bleeding from your vagina right away. Follow these instructions at home:  Watch your condition for any changes.  Follow your doctor's instructions about how active you can be.  If you are on bed rest: ? You may need to stay in bed and only get up to use the bathroom. ? You may be allowed to do some activities. ? If you need help, make plans for someone to help you.  Write down: ? The number of pads you use each day. ? How often you change pads. ? How soaked (saturated) your pads are.  Do not use tampons.  Do not douche.  Do not have sex or orgasms until your doctor says it is okay.  If you pass any tissue from your vagina, save the tissue so you can show it to your doctor.  Only take medicines as told by your doctor.  Do not take aspirin because it can make you bleed.  Keep all follow-up visits as told by your doctor. Contact a doctor if:  You bleed from your vagina.  You have cramps.  You have labor pains.  You have a fever that does not go away after you take medicine. Get help right away if:  You have very bad cramps in your back or belly (abdomen).  You pass large clots or tissue from your vagina.  You bleed more.  You feel light-headed or weak.  You pass out (faint).  You have chills.  You are leaking fluid or have a gush of fluid from your vagina.  You pass out while pooping (having a bowel movement). This information is not intended to replace advice given to you by your health care provider. Make sure you discuss any questions you have with your health care provider. Document Released: 09/02/2013 Document Revised: 09/24/2015 Document Reviewed: 12/24/2012 Elsevier Interactive Patient Education  [employer].

## 2016-10-19 LAB — GC/CHLAMYDIA PROBE AMP (~~LOC~~) NOT AT ARMC
CHLAMYDIA, DNA PROBE: NEGATIVE
NEISSERIA GONORRHEA: NEGATIVE

## 2016-10-19 LAB — HIV ANTIBODY (ROUTINE TESTING W REFLEX): HIV SCREEN 4TH GENERATION: NONREACTIVE

## 2016-10-27 ENCOUNTER — Inpatient Hospital Stay (HOSPITAL_COMMUNITY)
Admission: AD | Admit: 2016-10-27 | Discharge: 2016-10-27 | Disposition: A | Discharge status: Home / Self Care | Payer: 59 | Source: Ambulatory Visit | Attending: Obstetrics and Gynecology | Admitting: Obstetrics and Gynecology

## 2016-10-27 ENCOUNTER — Encounter (HOSPITAL_COMMUNITY): Payer: Self-pay | Admitting: *Deleted

## 2016-10-27 DIAGNOSIS — N939 Abnormal uterine and vaginal bleeding, unspecified: Secondary | ICD-10-CM | POA: Diagnosis present

## 2016-10-27 DIAGNOSIS — R109 Unspecified abdominal pain: Secondary | ICD-10-CM | POA: Diagnosis not present

## 2016-10-27 DIAGNOSIS — Z3A01 Less than 8 weeks gestation of pregnancy: Secondary | ICD-10-CM | POA: Diagnosis not present

## 2016-10-27 DIAGNOSIS — O26891 Other specified pregnancy related conditions, first trimester: Secondary | ICD-10-CM | POA: Diagnosis not present

## 2016-10-27 DIAGNOSIS — J45909 Unspecified asthma, uncomplicated: Secondary | ICD-10-CM | POA: Diagnosis not present

## 2016-10-27 DIAGNOSIS — O99511 Diseases of the respiratory system complicating pregnancy, first trimester: Secondary | ICD-10-CM | POA: Insufficient documentation

## 2016-10-27 DIAGNOSIS — O219 Vomiting of pregnancy, unspecified: Secondary | ICD-10-CM | POA: Insufficient documentation

## 2016-10-27 LAB — CBC
HCT: 35.6 % — ABNORMAL LOW (ref 36.0–46.0)
Hemoglobin: 12.3 g/dL (ref 12.0–15.0)
MCH: 28.5 pg (ref 26.0–34.0)
MCHC: 34.6 g/dL (ref 30.0–36.0)
MCV: 82.4 fL (ref 78.0–100.0)
PLATELETS: 296 10*3/uL (ref 150–400)
RBC: 4.32 MIL/uL (ref 3.87–5.11)
RDW: 14 % (ref 11.5–15.5)
WBC: 6.4 10*3/uL (ref 4.0–10.5)

## 2016-10-27 LAB — URINALYSIS, ROUTINE W REFLEX MICROSCOPIC
Bilirubin Urine: NEGATIVE
Glucose, UA: NEGATIVE mg/dL
HGB URINE DIPSTICK: NEGATIVE
KETONES UR: NEGATIVE mg/dL
Leukocytes, UA: NEGATIVE
NITRITE: NEGATIVE
PROTEIN: NEGATIVE mg/dL
SPECIFIC GRAVITY, URINE: 1.025 (ref 1.005–1.030)
pH: 7 (ref 5.0–8.0)

## 2016-10-27 MED ORDER — OXYCODONE-ACETAMINOPHEN 5-325 MG PO TABS
2.0000 | ORAL_TABLET | Freq: Once | ORAL | Status: AC
  Administered 2016-10-27: 2 via ORAL
  Filled 2016-10-27: qty 2

## 2016-10-27 MED ORDER — PROMETHAZINE HCL 25 MG/ML IJ SOLN
25.0000 mg | Freq: Once | INTRAMUSCULAR | Status: AC
  Administered 2016-10-27: 25 mg via INTRAMUSCULAR
  Filled 2016-10-27: qty 1

## 2016-10-27 NOTE — MAU Provider Note (Signed)
History     CSN: [id number]  Arrival date and time: 10/27/16 1540   First Provider Initiated Contact with Patient 10/27/16 1635      Chief Complaint  Patient presents with  . Abdominal Pain  . Vaginal Bleeding   G4P2012 @[redacted]w[redacted]d  here with LAP. Pain started last night. Describes as constant, sharp, and worse on right side. Rates 10/10. Has not used anything for it. Noticed some pink spotting last night and more red today. No fevers. No urinary sx. Last BM 2 days ago,this is nml pattern for her. Reports daily N/V and uses Phenergan at night. Had [place] 9 days ago that showed viable IUP.   OB History    Gravida Para Term Preterm AB Living   4 2 2     2    SAB TAB Ectopic Multiple Live Births         0 2      Past Medical History:  Diagnosis Date  . Asthma    Last used 08/30/14; weekly inhaler use  . Cholestasis of pregnancy in third trimester 07/25/2015  . Eczema   . Headache(784.0)    migraines  . NST (non-stress test) nonreactive   . SVD (spontaneous vaginal delivery) 07/26/2015    Past Surgical History:  Procedure Laterality Date  . MOUTH SURGERY    . TYMPANOSTOMY TUBE PLACEMENT    . WISDOM TOOTH EXTRACTION      Family History  Problem Relation Age of Onset  . Diabetes Maternal Grandmother   . Hypertension Maternal Grandmother   . Stroke Maternal Grandmother   . High blood pressure Mother   . Hypertension Mother     Social History  Substance Use Topics  . Smoking status: Never Smoker  . Smokeless tobacco: Never Used  . Alcohol use No    Allergies:  Allergies  Allergen Reactions  . Pulmicort [Budesonide] Palpitations    Racing heart  . Tomato Hives and Itching  . Other Hives    mushrooms    Prescriptions Prior to Admission  Medication Sig Dispense Refill Last Dose  . albuterol (PROVENTIL HFA;VENTOLIN HFA) 108 (90 Base) MCG/ACT inhaler Inhale 2 puffs into the lungs every 6 (six) hours as needed for wheezing or shortness of breath.   10/26/2016 at Unknown  time  . citalopram (CELEXA) 20 MG tablet Take 1 tablet (20 mg total) by mouth daily. (Patient taking differently: Take 20 mg by mouth every other day. ) 30 tablet 0 10/26/2016 at Unknown time  . Prenat w/o A Vit-FeFum-FePo-FA (CONCEPT OB) 130-92.4-1 MG CAPS Take 1 tablet by mouth daily. 30 capsule 12 Past Week at Unknown time  . promethazine (PHENERGAN) 25 MG tablet Take 1 tablet (25 mg total) by mouth every 6 (six) hours as needed. (Patient taking differently: Take 25 mg by mouth every 6 (six) hours as needed for nausea or vomiting. ) 30 tablet 1 10/26/2016 at Unknown time  . diphenhydrAMINE (BENADRYL) 25 MG tablet Take 25 mg by mouth every 6 (six) hours as needed for allergies.    Not Taking at Unknown time  . diphenhydramine-acetaminophen (TYLENOL PM) 25-500 MG TABS tablet Take 1 tablet by mouth at bedtime as needed (sleep).   Not Taking at Unknown time    Review of Systems  Gastrointestinal: Positive for abdominal pain, nausea and vomiting. Negative for constipation and diarrhea.  Genitourinary: Positive for vaginal bleeding.   Physical Exam   Blood pressure 97/65, pulse 88, temperature 98.3 F (36.8 C), temperature source Oral, resp. rate 16,  height 4\' 11"  (1.499 m), weight 59.4 kg (131 lb), last menstrual period 09/02/2016, not currently breastfeeding.  Physical Exam  Nursing note and vitals reviewed. Constitutional: She is oriented to person, place, and time. She appears well-developed and well-nourished. No distress.  HENT:  Head: Normocephalic and atraumatic.  Neck: Normal range of motion.  Cardiovascular: Normal rate.   Respiratory: Effort normal.  GI: Soft. She exhibits no distension and no mass. There is tenderness in the suprapubic area. There is no rigidity, no rebound and no guarding.  Genitourinary:  Genitourinary Comments: External: no lesions or erythema Vagina: rugated, pink, moist, scant white discharge, no blood seen Cervix closed/long   Musculoskeletal: Normal range  of motion.  Neurological: She is alert and oriented to person, place, and time.  Skin: Skin is warm and dry.  Psychiatric: She has a normal mood and affect.   Results for orders placed or performed during the hospital encounter of 10/27/16 (from the past 24 hour(s))  Urinalysis, Routine w reflex microscopic     Status: Abnormal   Collection Time: 10/27/16  4:03 PM  Result Value Ref Range   Color, Urine YELLOW YELLOW   APPearance HAZY (A) CLEAR   Specific Gravity, Urine 1.025 1.005 - 1.030   pH 7.0 5.0 - 8.0   Glucose, UA NEGATIVE NEGATIVE mg/dL   Hgb urine dipstick NEGATIVE NEGATIVE   Bilirubin Urine NEGATIVE NEGATIVE   Ketones, ur NEGATIVE NEGATIVE mg/dL   Protein, ur NEGATIVE NEGATIVE mg/dL   Nitrite NEGATIVE NEGATIVE   Leukocytes, UA NEGATIVE NEGATIVE  CBC     Status: Abnormal   Collection Time: 10/27/16  4:54 PM  Result Value Ref Range   WBC 6.4 4.0 - 10.5 K/uL   RBC 4.32 3.87 - 5.11 MIL/uL   Hemoglobin 12.3 12.0 - 15.0 g/dL   HCT 35.6 (L) 36.0 - 46.0 %   MCV 82.4 78.0 - 100.0 fL   MCH 28.5 26.0 - 34.0 pg   MCHC 34.6 30.0 - 36.0 g/dL   RDW 14.0 11.5 - 15.5 %   Platelets 296 150 - 400 K/uL   MAU Course  Procedures Percocet Phenergan 25 mg IM  MDM Labs ordered and reviewed. No evidence of acute abdominal process, UTI, or imminent SAB. Pain likely physiologic to early pregnancy. Nausea improved after meds. Presentation, clinical findings, and plan discussed with Dr. Melba Coon. Stable for discharge home.  Assessment and Plan   1. [redacted] weeks gestation of pregnancy   2. Abdominal pain during pregnancy, first trimester   3. Nausea/vomiting in pregnancy    Discharge home Follow up in OB office as scheduled SAB/return precautions Diclegis OTC regimen Tylenol/heat prn  Allergies as of 10/27/2016      Reactions   Pulmicort [budesonide] Palpitations   Racing heart   Tomato Hives, Itching   Other Hives   mushrooms      Medication List    TAKE these medications    albuterol 108 (90 Base) MCG/ACT inhaler Commonly known as:  PROVENTIL HFA;VENTOLIN HFA Inhale 2 puffs into the lungs every 6 (six) hours as needed for wheezing or shortness of breath.   citalopram 20 MG tablet Commonly known as:  CELEXA Take 1 tablet (20 mg total) by mouth daily. What changed:  when to take this   CONCEPT OB 130-92.4-1 MG Caps Take 1 tablet by mouth daily.   diphenhydrAMINE 25 MG tablet Commonly known as:  BENADRYL Take 25 mg by mouth every 6 (six) hours as needed for allergies.  diphenhydramine-acetaminophen 25-500 MG Tabs tablet Commonly known as:  TYLENOL PM Take 1 tablet by mouth at bedtime as needed (sleep).   promethazine 25 MG tablet Commonly known as:  PHENERGAN Take 1 tablet (25 mg total) by mouth every 6 (six) hours as needed. What changed:  reasons to take this      Julianne Handler, CNM 10/27/2016, 4:48 PM

## 2016-10-27 NOTE — MAU Note (Signed)
Pt was woken up last night with pain and cramping. Spotting started at that time and was pinkish in color. Pt had some more spotting today that was mor red in color. Pain is in located in lower abdomen and radiates to her legs and rectum. Last BM was 2 days ago, but pt reports that this is normal for her. In addition pt has n/v throughout the day. She takes phenergan at night.  Unable to take during the day at work because it makes her too sleepy.

## 2016-10-27 NOTE — MAU Note (Signed)
Pt C/O lower abd pain, has been ongoing but the pain became worse last night.  Started spotting last night, has continued today.  Feeling pelvic pressure.  Still vomiting, has an ongoing issue.

## 2016-10-27 NOTE — Discharge Instructions (Signed)
Morning Sickness Morning sickness is when you feel sick to your stomach (nauseous) during pregnancy. This nauseous feeling may or may not come with vomiting. It often occurs in the morning but can be a problem any time of day. Morning sickness is most common during the first trimester, but it may continue throughout pregnancy. While morning sickness is unpleasant, it is usually harmless unless you develop severe and continual vomiting (hyperemesis gravidarum). This condition requires more intense treatment. What are the causes? The cause of morning sickness is not completely known but seems to be related to normal hormonal changes that occur in pregnancy. What increases the risk? You are at greater risk if you:  Experienced nausea or vomiting before your pregnancy.  Had morning sickness during a previous pregnancy.  Are pregnant with more than one baby, such as twins.  How is this treated? Do not use any medicines (prescription, over-the-counter, or herbal) for morning sickness without first talking to your health care provider. Your health care provider may prescribe or recommend:  Vitamin B6 supplements.  Anti-nausea medicines.  The herbal medicine ginger.  Follow these instructions at home:  Only take over-the-counter or prescription medicines as directed by your health care provider.  Taking multivitamins before getting pregnant can prevent or decrease the severity of morning sickness in most women.  Eat a piece of dry toast or unsalted crackers before getting out of bed in the morning.  Eat five or six small meals a day.  Eat dry and bland foods (rice, baked potato). Foods high in carbohydrates are often helpful.  Do not drink liquids with your meals. Drink liquids between meals.  Avoid greasy, fatty, and spicy foods.  Get someone to cook for you if the smell of any food causes nausea and vomiting.  If you feel nauseous after taking prenatal vitamins, take the vitamins at  night or with a snack.  Snack on protein foods (nuts, yogurt, cheese) between meals if you are hungry.  Eat unsweetened gelatins for desserts.  Wearing an acupressure wristband (worn for sea sickness) may be helpful.  Acupuncture may be helpful.  Do not smoke.  Get a humidifier to keep the air in your house free of odors.  Get plenty of fresh air. Contact a health care provider if:  Your home remedies are not working, and you need medicine.  You feel dizzy or lightheaded.  You are losing weight. Get help right away if:  You have persistent and uncontrolled nausea and vomiting.  You pass out (faint). This information is not intended to replace advice given to you by your health care provider. Make sure you discuss any questions you have with your health care provider. Document Released: 06/09/2006 Document Revised: 09/24/2015 Document Reviewed: 10/03/2012 Elsevier Interactive Patient Education  2017 Else[employer]. Abdominal Pain During Pregnancy Belly (abdominal) pain is common during pregnancy. Most of the time, it is not a serious problem. Other times, it can be a sign that something is wrong with the pregnancy. Always tell your doctor if you have belly pain. Follow these instructions at home: Monitor your belly pain for any changes. The following actions may help you feel better:  Do not have sex (intercourse) or put anything in your vagina until you feel better.  Rest until your pain stops.  Drink clear fluids if you feel sick to your stomach (nauseous). Do not eat solid food until you feel better.  Only take medicine as told by your doctor.  Keep all doctor visits as  told.  Get help right away if:  You are bleeding, leaking fluid, or pieces of tissue come out of your vagina.  You have more pain or cramping.  You keep throwing up (vomiting).  You have pain when you pee (urinate) or have blood in your pee.  You have a fever.  You do not feel your baby moving  as much.  You feel very weak or feel like passing out.  You have trouble breathing, with or without belly pain.  You have a very bad headache and belly pain.  You have fluid leaking from your vagina and belly pain.  You keep having watery poop (diarrhea).  Your belly pain does not go away after resting, or the pain gets worse. This information is not intended to replace advice given to you by your health care provider. Make sure you discuss any questions you have with your health care provider. Document Released: 04/06/2009 Document Revised: 11/25/2015 Document Reviewed: 11/15/2012 Elsevier Interactive Patient Education  [employer].

## 2016-11-04 ENCOUNTER — Inpatient Hospital Stay (HOSPITAL_COMMUNITY)
Admission: AD | Admit: 2016-11-04 | Discharge: 2016-11-05 | Disposition: A | Discharge status: Home / Self Care | Payer: 59 | Source: Ambulatory Visit | Attending: Obstetrics and Gynecology | Admitting: Obstetrics and Gynecology

## 2016-11-04 ENCOUNTER — Encounter (HOSPITAL_COMMUNITY): Payer: Self-pay | Admitting: *Deleted

## 2016-11-04 DIAGNOSIS — O99351 Diseases of the nervous system complicating pregnancy, first trimester: Secondary | ICD-10-CM | POA: Insufficient documentation

## 2016-11-04 DIAGNOSIS — O210 Mild hyperemesis gravidarum: Secondary | ICD-10-CM | POA: Diagnosis not present

## 2016-11-04 DIAGNOSIS — J45909 Unspecified asthma, uncomplicated: Secondary | ICD-10-CM | POA: Insufficient documentation

## 2016-11-04 DIAGNOSIS — R1030 Lower abdominal pain, unspecified: Secondary | ICD-10-CM | POA: Insufficient documentation

## 2016-11-04 DIAGNOSIS — Z3A09 9 weeks gestation of pregnancy: Secondary | ICD-10-CM | POA: Insufficient documentation

## 2016-11-04 DIAGNOSIS — R109 Unspecified abdominal pain: Secondary | ICD-10-CM

## 2016-11-04 DIAGNOSIS — O219 Vomiting of pregnancy, unspecified: Secondary | ICD-10-CM | POA: Diagnosis not present

## 2016-11-04 DIAGNOSIS — G43009 Migraine without aura, not intractable, without status migrainosus: Secondary | ICD-10-CM | POA: Insufficient documentation

## 2016-11-04 DIAGNOSIS — O99511 Diseases of the respiratory system complicating pregnancy, first trimester: Secondary | ICD-10-CM | POA: Insufficient documentation

## 2016-11-04 DIAGNOSIS — O26899 Other specified pregnancy related conditions, unspecified trimester: Secondary | ICD-10-CM

## 2016-11-04 LAB — URINALYSIS, ROUTINE W REFLEX MICROSCOPIC
Bilirubin Urine: NEGATIVE
Glucose, UA: NEGATIVE mg/dL
Hgb urine dipstick: NEGATIVE
Ketones, ur: NEGATIVE mg/dL
Leukocytes, UA: NEGATIVE
Nitrite: NEGATIVE
PH: 5 (ref 5.0–8.0)
Protein, ur: NEGATIVE mg/dL
Specific Gravity, Urine: 1.026 (ref 1.005–1.030)

## 2016-11-04 LAB — CBC WITH DIFFERENTIAL/PLATELET
Basophils Absolute: 0 10*3/uL (ref 0.0–0.1)
Basophils Relative: 0 %
Eosinophils Absolute: 0.1 10*3/uL (ref 0.0–0.7)
Eosinophils Relative: 2 %
HEMATOCRIT: 34.7 % — AB (ref 36.0–46.0)
HEMOGLOBIN: 11.8 g/dL — AB (ref 12.0–15.0)
LYMPHS PCT: 24 %
Lymphs Abs: 1.7 10*3/uL (ref 0.7–4.0)
MCH: 28.2 pg (ref 26.0–34.0)
MCHC: 34 g/dL (ref 30.0–36.0)
MCV: 83 fL (ref 78.0–100.0)
MONOS PCT: 3 %
Monocytes Absolute: 0.2 10*3/uL (ref 0.1–1.0)
NEUTROS PCT: 71 %
Neutro Abs: 4.9 10*3/uL (ref 1.7–7.7)
Platelets: 295 10*3/uL (ref 150–400)
RBC: 4.18 MIL/uL (ref 3.87–5.11)
RDW: 14 % (ref 11.5–15.5)
WBC: 6.9 10*3/uL (ref 4.0–10.5)

## 2016-11-04 LAB — COMPREHENSIVE METABOLIC PANEL
ALBUMIN: 4.1 g/dL (ref 3.5–5.0)
ALK PHOS: 46 U/L (ref 38–126)
ALT: 11 U/L — ABNORMAL LOW (ref 14–54)
ANION GAP: 7 (ref 5–15)
AST: 17 U/L (ref 15–41)
BILIRUBIN TOTAL: 0.4 mg/dL (ref 0.3–1.2)
BUN: 7 mg/dL (ref 6–20)
CALCIUM: 9.4 mg/dL (ref 8.9–10.3)
CO2: 25 mmol/L (ref 22–32)
Chloride: 106 mmol/L (ref 101–111)
Creatinine, Ser: 0.5 mg/dL (ref 0.44–1.00)
GFR calc non Af Amer: >60 mL/min (ref >60)
GLUCOSE: 81 mg/dL (ref 65–99)
Potassium: 3.9 mmol/L (ref 3.5–5.1)
Sodium: 138 mmol/L (ref 135–145)
TOTAL PROTEIN: 7.4 g/dL (ref 6.5–8.1)

## 2016-11-04 MED ORDER — METOCLOPRAMIDE HCL 5 MG/ML IJ SOLN
10.0000 mg | Freq: Once | INTRAMUSCULAR | Status: AC
  Administered 2016-11-04: 10 mg via INTRAVENOUS
  Filled 2016-11-04: qty 2

## 2016-11-04 MED ORDER — LACTATED RINGERS IV BOLUS (SEPSIS)
1000.0000 mL | Freq: Once | INTRAVENOUS | Status: AC
  Administered 2016-11-04: 1000 mL via INTRAVENOUS

## 2016-11-04 MED ORDER — DIPHENHYDRAMINE HCL 50 MG/ML IJ SOLN
12.5000 mg | Freq: Once | INTRAMUSCULAR | Status: AC
  Administered 2016-11-04: 12.5 mg via INTRAVENOUS
  Filled 2016-11-04: qty 1

## 2016-11-04 NOTE — MAU Note (Signed)
Having a lot of abd pain for weeks which is getting worse. Unable to keep down anything and have a bad headache from n/v. When able to keep down Tylenol it does not help.

## 2016-11-04 NOTE — MAU Provider Note (Signed)
History     CSN: [id number]  Arrival date and time: 11/04/16 2204   First Provider Initiated Contact with Patient 11/04/16 2305      Chief Complaint  Patient presents with  . Abdominal Pain  . Headache  . Emesis   HPI   [name] is a 26 y.o. female [phone] @ 66w0dhere in MAU with lower abdominal pain that started 2 weeks ago. She feels the pain is getting worse. The pain comes every day "all day". The pain is sharp pain and shoots down her legs and her lower back. " something is wrong in my stomach". The pain is achy at times.   The HA started yesterday. Patient has a history of migraines.  Has tried tylenol for the pain however it does not help.   OB History    Gravida Para Term Preterm AB Living   '4 2 2     2   ' SAB TAB Ectopic Multiple Live Births         0 2      Past Medical History:  Diagnosis Date  . Asthma    Last used 08/30/14; weekly inhaler use  . Cholestasis of pregnancy in third trimester 07/25/2015  . Eczema   . Headache(784.0)    migraines  . NST (non-stress test) nonreactive   . SVD (spontaneous vaginal delivery) 07/26/2015    Past Surgical History:  Procedure Laterality Date  . MOUTH SURGERY    . TYMPANOSTOMY TUBE PLACEMENT    . WISDOM TOOTH EXTRACTION      Family History  Problem Relation Age of Onset  . Diabetes Maternal Grandmother   . Hypertension Maternal Grandmother   . Stroke Maternal Grandmother   . High blood pressure Mother   . Hypertension Mother     Social History  Substance Use Topics  . Smoking status: Never Smoker  . Smokeless tobacco: Never Used  . Alcohol use No    Allergies:  Allergies  Allergen Reactions  . Pulmicort [Budesonide] Palpitations    Racing heart  . Tomato Hives and Itching  . Other Hives    mushrooms    Prescriptions Prior to Admission  Medication Sig Dispense Refill Last Dose  . citalopram (CELEXA) 20 MG tablet Take 1 tablet (20 mg total) by mouth daily. (Patient taking differently:  Take 20 mg by mouth every other day. ) 30 tablet 0 Past Week at Unknown time  . Prenat w/o A Vit-FeFum-FePo-FA (CONCEPT OB) 130-92.4-1 MG CAPS Take 1 tablet by mouth daily. 30 capsule 12 Past Month at Unknown time  . promethazine (PHENERGAN) 25 MG tablet Take 1 tablet (25 mg total) by mouth every 6 (six) hours as needed. (Patient taking differently: Take 25 mg by mouth every 6 (six) hours as needed for nausea or vomiting. ) 30 tablet 1 11/04/2016 at Unknown time  . albuterol (PROVENTIL HFA;VENTOLIN HFA) 108 (90 Base) MCG/ACT inhaler Inhale 2 puffs into the lungs every 6 (six) hours as needed for wheezing or shortness of breath.   10/26/2016 at Unknown time  . diphenhydrAMINE (BENADRYL) 25 MG tablet Take 25 mg by mouth every 6 (six) hours as needed for allergies.    Not Taking at Unknown time  . diphenhydramine-acetaminophen (TYLENOL PM) 25-500 MG TABS tablet Take 1 tablet by mouth at bedtime as needed (sleep).   Not Taking at Unknown time   Results for orders placed or performed during the hospital encounter of 11/04/16 (from the past 48 hour(s))  Urinalysis, Routine  w reflex microscopic     Status: Abnormal   Collection Time: 11/04/16 10:35 PM  Result Value Ref Range   Color, Urine YELLOW YELLOW   APPearance HAZY (A) CLEAR   Specific Gravity, Urine 1.026 1.005 - 1.030   pH 5.0 5.0 - 8.0   Glucose, UA NEGATIVE NEGATIVE mg/dL   Hgb urine dipstick NEGATIVE NEGATIVE   Bilirubin Urine NEGATIVE NEGATIVE   Ketones, ur NEGATIVE NEGATIVE mg/dL   Protein, ur NEGATIVE NEGATIVE mg/dL   Nitrite NEGATIVE NEGATIVE   Leukocytes, UA NEGATIVE NEGATIVE   RBC / HPF 0-5 0 - 5 RBC/hpf   WBC, UA 0-5 0 - 5 WBC/hpf   Bacteria, UA RARE (A) NONE SEEN   Squamous Epithelial / LPF 6-30 (A) NONE SEEN   Mucous PRESENT   CBC with Differential     Status: Abnormal   Collection Time: 11/04/16 11:17 PM  Result Value Ref Range   WBC 6.9 4.0 - 10.5 K/uL   RBC 4.18 3.87 - 5.11 MIL/uL   Hemoglobin 11.8 (L) 12.0 - 15.0 g/dL    HCT 34.7 (L) 36.0 - 46.0 %   MCV 83.0 78.0 - 100.0 fL   MCH 28.2 26.0 - 34.0 pg   MCHC 34.0 30.0 - 36.0 g/dL   RDW 14.0 11.5 - 15.5 %   Platelets 295 150 - 400 K/uL   Neutrophils Relative % 71 %   Neutro Abs 4.9 1.7 - 7.7 K/uL   Lymphocytes Relative 24 %   Lymphs Abs 1.7 0.7 - 4.0 K/uL   Monocytes Relative 3 %   Monocytes Absolute 0.2 0.1 - 1.0 K/uL   Eosinophils Relative 2 %   Eosinophils Absolute 0.1 0.0 - 0.7 K/uL   Basophils Relative 0 %   Basophils Absolute 0.0 0.0 - 0.1 K/uL  Comprehensive metabolic panel     Status: Abnormal   Collection Time: 11/04/16 11:17 PM  Result Value Ref Range   Sodium 138 135 - 145 mmol/L   Potassium 3.9 3.5 - 5.1 mmol/L   Chloride 106 101 - 111 mmol/L   CO2 25 22 - 32 mmol/L   Glucose, Bld 81 65 - 99 mg/dL   BUN 7 6 - 20 mg/dL   Creatinine, Ser 0.50 0.44 - 1.00 mg/dL   Calcium 9.4 8.9 - 10.3 mg/dL   Total Protein 7.4 6.5 - 8.1 g/dL   Albumin 4.1 3.5 - 5.0 g/dL   AST 17 15 - 41 U/L   ALT 11 (L) 14 - 54 U/L   Alkaline Phosphatase 46 38 - 126 U/L   Total Bilirubin 0.4 0.3 - 1.2 mg/dL   GFR calc non Af Amer >60 >60 mL/min   GFR calc Af Amer >60 >60 mL/min    Comment: (NOTE) The eGFR has been calculated using the CKD EPI equation. This calculation has not been validated in all clinical situations. eGFR's persistently <60 mL/min signify possible Chronic Kidney Disease.    Anion gap 7 5 - 15    Review of Systems  Constitutional: Negative for fever.  Eyes: Positive for photophobia.  Gastrointestinal: Positive for diarrhea, nausea and vomiting. Negative for constipation.  Genitourinary: Negative for dysuria.   Physical Exam   Blood pressure 122/79, pulse 92, temperature 98.6 F (37 C), resp. rate 18, height '4\' 11"'  (1.499 m), weight 134 lb (60.8 kg), last menstrual period 09/02/2016, not currently breastfeeding.  Physical Exam  Constitutional: She is oriented to person, place, and time. She appears well-developed and well-nourished.   Non-toxic  appearance. She does not have a sickly appearance. She does not appear ill. No distress.  GI: Soft. Normal appearance. There is generalized tenderness. There is no rigidity, no rebound and no guarding.  Genitourinary:  Genitourinary Comments: Cervix: closed, middle   Musculoskeletal: Normal range of motion.  Neurological: She is alert and oriented to person, place, and time.  Skin: Skin is warm. She is not diaphoretic.  Psychiatric: Her behavior is normal. Her mood appears anxious.    MAU Course  Procedures  None  MDM  10/10 abdominal pain. Patient upset, worried that something is wrong due to pain present X 2 weeks.  Transvaginal US ordered  LR bolus  CBC, CMP HA cocktail.  [place] without acute findings. Patient feels reassured.  Symptoms improved, patient says she feels much better. Pain 0/10.  Discussed patient with Dr. Melba Coon.   Assessment and Plan    A:  1. Nausea and vomiting in pregnancy   2. Abdominal pain in pregnancy, antepartum   3. Migraine without aura and without status migrainosus, not intractable     P:  Discharge home in stable condition Increase PO fluid intake. Encouraged patient to take diclegis. Additional RX given. Follow up with OB as scheduled, or sooner if needed.  Small, frequent meals.    Noni Saupe I, NP 11/05/2016 1:11 AM

## 2016-11-05 ENCOUNTER — Inpatient Hospital Stay (HOSPITAL_COMMUNITY): Payer: 59

## 2016-11-05 DIAGNOSIS — O219 Vomiting of pregnancy, unspecified: Secondary | ICD-10-CM

## 2016-11-05 DIAGNOSIS — O210 Mild hyperemesis gravidarum: Secondary | ICD-10-CM | POA: Diagnosis not present

## 2016-11-05 MED ORDER — DOXYLAMINE-PYRIDOXINE 10-10 MG PO TBEC: 2.0000 | DELAYED_RELEASE_TABLET | Freq: Every day | ORAL | 1 refills | Status: DC

## 2016-11-05 NOTE — Discharge Instructions (Signed)

## 2016-11-05 NOTE — Progress Notes (Signed)
Noni Saupe NP in to discuss test results and d/c plan,. Written and verbal d/c instructions given and understanding voiced

## 2016-11-11 LAB — OB RESULTS CONSOLE GC/CHLAMYDIA
Chlamydia: NEGATIVE
Gonorrhea: NEGATIVE

## 2016-11-11 LAB — OB RESULTS CONSOLE ANTIBODY SCREEN: ANTIBODY SCREEN: NEGATIVE

## 2016-11-11 LAB — OB RESULTS CONSOLE RUBELLA ANTIBODY, IGM: Rubella: IMMUNE

## 2016-11-11 LAB — OB RESULTS CONSOLE HEPATITIS B SURFACE ANTIGEN: Hepatitis B Surface Ag: NEGATIVE

## 2016-11-11 LAB — OB RESULTS CONSOLE HIV ANTIBODY (ROUTINE TESTING): HIV: NONREACTIVE

## 2016-11-11 LAB — OB RESULTS CONSOLE ABO/RH: RH TYPE: POSITIVE

## 2016-11-11 LAB — OB RESULTS CONSOLE RPR: RPR: NONREACTIVE

## 2016-11-21 ENCOUNTER — Encounter (HOSPITAL_COMMUNITY): Payer: Self-pay | Admitting: *Deleted

## 2016-11-21 ENCOUNTER — Inpatient Hospital Stay (HOSPITAL_COMMUNITY)
Admission: AD | Admit: 2016-11-21 | Discharge: 2016-11-21 | Disposition: A | Discharge status: Home / Self Care | Payer: 59 | Source: Ambulatory Visit | Attending: Obstetrics and Gynecology | Admitting: Obstetrics and Gynecology

## 2016-11-21 DIAGNOSIS — O26892 Other specified pregnancy related conditions, second trimester: Secondary | ICD-10-CM | POA: Diagnosis not present

## 2016-11-21 DIAGNOSIS — Z3A11 11 weeks gestation of pregnancy: Secondary | ICD-10-CM | POA: Insufficient documentation

## 2016-11-21 DIAGNOSIS — O219 Vomiting of pregnancy, unspecified: Secondary | ICD-10-CM | POA: Diagnosis not present

## 2016-11-21 DIAGNOSIS — T148XXA Other injury of unspecified body region, initial encounter: Secondary | ICD-10-CM | POA: Insufficient documentation

## 2016-11-21 DIAGNOSIS — O210 Mild hyperemesis gravidarum: Secondary | ICD-10-CM | POA: Diagnosis present

## 2016-11-21 LAB — COMPREHENSIVE METABOLIC PANEL
ALBUMIN: 3.9 g/dL (ref 3.5–5.0)
ALT: 10 U/L — ABNORMAL LOW (ref 14–54)
ANION GAP: 6 (ref 5–15)
AST: 16 U/L (ref 15–41)
Alkaline Phosphatase: 41 U/L (ref 38–126)
BILIRUBIN TOTAL: 0.5 mg/dL (ref 0.3–1.2)
BUN: 8 mg/dL (ref 6–20)
CHLORIDE: 106 mmol/L (ref 101–111)
CO2: 23 mmol/L (ref 22–32)
Calcium: 8.8 mg/dL — ABNORMAL LOW (ref 8.9–10.3)
Creatinine, Ser: 0.55 mg/dL (ref 0.44–1.00)
GFR calc Af Amer: >60 mL/min (ref >60)
GLUCOSE: 92 mg/dL (ref 65–99)
POTASSIUM: 3.5 mmol/L (ref 3.5–5.1)
Sodium: 135 mmol/L (ref 135–145)
TOTAL PROTEIN: 7.5 g/dL (ref 6.5–8.1)

## 2016-11-21 LAB — CBC
HEMATOCRIT: 35.5 % — AB (ref 36.0–46.0)
Hemoglobin: 12.2 g/dL (ref 12.0–15.0)
MCH: 28.4 pg (ref 26.0–34.0)
MCHC: 34.4 g/dL (ref 30.0–36.0)
MCV: 82.6 fL (ref 78.0–100.0)
PLATELETS: 266 10*3/uL (ref 150–400)
RBC: 4.3 MIL/uL (ref 3.87–5.11)
RDW: 13.8 % (ref 11.5–15.5)
WBC: 5.6 10*3/uL (ref 4.0–10.5)

## 2016-11-21 LAB — URINALYSIS, ROUTINE W REFLEX MICROSCOPIC
BILIRUBIN URINE: NEGATIVE
GLUCOSE, UA: NEGATIVE mg/dL
HGB URINE DIPSTICK: NEGATIVE
Ketones, ur: NEGATIVE mg/dL
Leukocytes, UA: NEGATIVE
Nitrite: NEGATIVE
PROTEIN: NEGATIVE mg/dL
Specific Gravity, Urine: 1.02 (ref 1.005–1.030)
pH: 7 (ref 5.0–8.0)

## 2016-11-21 MED ORDER — DIPHENHYDRAMINE HCL 50 MG/ML IJ SOLN
25.0000 mg | Freq: Once | INTRAMUSCULAR | Status: DC
  Filled 2016-11-21: qty 1

## 2016-11-21 MED ORDER — DEXAMETHASONE SODIUM PHOSPHATE 10 MG/ML IJ SOLN
10.0000 mg | Freq: Once | INTRAMUSCULAR | Status: DC
  Filled 2016-11-21: qty 1

## 2016-11-21 MED ORDER — DEXTROSE 5 % IN LACTATED RINGERS IV BOLUS
1000.0000 mL | Freq: Once | INTRAVENOUS | Status: AC
  Administered 2016-11-21: 1000 mL via INTRAVENOUS

## 2016-11-21 MED ORDER — METOCLOPRAMIDE HCL 5 MG/ML IJ SOLN
10.0000 mg | Freq: Once | INTRAMUSCULAR | Status: DC
  Filled 2016-11-21: qty 2

## 2016-11-21 MED ORDER — ONDANSETRON 8 MG PO TBDP: 8.0000 mg | ORAL_TABLET | Freq: Three times a day (TID) | ORAL | 3 refills | Status: DC | PRN

## 2016-11-21 MED ORDER — IBUPROFEN 800 MG PO TABS
800.0000 mg | ORAL_TABLET | Freq: Once | ORAL | Status: AC
  Administered 2016-11-21: 800 mg via ORAL
  Filled 2016-11-21: qty 1

## 2016-11-21 NOTE — Discharge Instructions (Signed)
Nausea medication to take during pregnancy:   Unisom (doxylamine succinate 25 mg tablets) Take one tablet daily at bedtime. If symptoms are not adequately controlled, the dose can be increased to a maximum recommended dose of two tablets daily (1/2 tablet in the morning, 1/2 tablet mid-afternoon and one at bedtime).  Vitamin B6 100mg  tablets. Take one tablet up to twice per day (up to 200 mg per day).  Safe Medications in Pregnancy   Acne: Benzoyl Peroxide Salicylic Acid  Backache/Headache: Tylenol: 2 regular strength every 4 hours OR              2 Extra strength every 6 hours  Colds/Coughs/Allergies: Benadryl (alcohol free) 25 mg every 6 hours as needed Breath right strips Claritin Cepacol throat lozenges Chloraseptic throat spray Cold-Eeze- up to three times per day Cough drops, alcohol free Flonase (by prescription only) Guaifenesin Mucinex Robitussin DM (plain only, alcohol free) Saline nasal spray/drops Sudafed (pseudoephedrine) & Actifed ** use only after [redacted] weeks gestation and if you do not have high blood pressure Tylenol Vicks Vaporub Zinc lozenges Zyrtec   Constipation: Colace Ducolax suppositories Fleet enema Glycerin suppositories Metamucil Milk of magnesia Miralax Senokot Smooth move tea  Diarrhea: Kaopectate Imodium A-D  *NO pepto Bismol  Hemorrhoids: Anusol Anusol HC Preparation H Tucks  Indigestion: Tums Maalox Mylanta Zantac  Pepcid  Insomnia: Benadryl (alcohol free) 25mg  every 6 hours as needed Tylenol PM Unisom, no Gelcaps  Leg Cramps: Tums MagGel  Nausea/Vomiting:  Bonine Dramamine Emetrol Ginger extract Sea bands Meclizine   Skin Rashes: Aveeno products Benadryl cream or 25mg  every 6 hours as needed Calamine Lotion 1% cortisone cream  Yeast infection: Gyne-lotrimin 7 Monistat 7   **If taking multiple medications, please check labels to avoid duplicating the same active ingredients **take medication as  directed on the label ** Do not exceed 4000 mg of tylenol in 24 hours **Do not take medications that contain aspirin or ibuprofen

## 2016-11-21 NOTE — MAU Provider Note (Signed)
History     CSN: [id number]  Arrival date and time: 11/21/16 9518   First Provider Initiated Contact with Patient 11/21/16 [phone]      Chief Complaint  Patient presents with  . Emesis  . Headache  . Abdominal Pain   Emesis   This is a new problem. The current episode started in the past 7 days. Episode frequency: "all day" The problem has been unchanged. The emesis has an appearance of stomach contents. There has been no fever. Associated symptoms include abdominal pain, diarrhea (occasional loose stools ), dizziness and headaches. Pertinent negatives include no chills or fever. Risk factors: pregnancy  Treatments tried: promethezine  The treatment provided significant (only take at night because it makes me sleepy. ) relief.  Headache   This is a new problem. The current episode started in the past 7 days. The problem occurs constantly. The problem has been unchanged. The pain is located in the frontal region. The pain does not radiate. The pain quality is similar to prior headaches (hx of migraines). The pain is at a severity of 8/10. Associated symptoms include abdominal pain, dizziness, nausea and vomiting. Pertinent negatives include no fever. The symptoms are aggravated by activity and noise. She has tried nothing for the symptoms.  Abdominal Pain  This is a new problem. The current episode started 1 to 4 weeks ago. The problem occurs intermittently. The problem has been unchanged. The pain is located in the generalized abdominal region. The pain is at a severity of 9/10. The quality of the pain is dull. The abdominal pain radiates to the back. Associated symptoms include diarrhea (occasional loose stools ), headaches, nausea and vomiting. Pertinent negatives include no dysuria or fever. The pain is aggravated by vomiting. The pain is relieved by nothing. She has tried nothing for the symptoms.    Past Medical History:  Diagnosis Date  . Asthma    Last used 08/30/14; weekly inhaler use   . Cholestasis of pregnancy in third trimester 07/25/2015  . Eczema   . Headache(784.0)    migraines  . NST (non-stress test) nonreactive   . SVD (spontaneous vaginal delivery) 07/26/2015    Past Surgical History:  Procedure Laterality Date  . MOUTH SURGERY    . TYMPANOSTOMY TUBE PLACEMENT    . WISDOM TOOTH EXTRACTION      Family History  Problem Relation Age of Onset  . Diabetes Maternal Grandmother   . Hypertension Maternal Grandmother   . Stroke Maternal Grandmother   . High blood pressure Mother   . Hypertension Mother     Social History  Substance Use Topics  . Smoking status: Never Smoker  . Smokeless tobacco: Never Used  . Alcohol use No    Allergies:  Allergies  Allergen Reactions  . Pulmicort [Budesonide] Palpitations    Racing heart  . Tomato Hives and Itching  . Other Hives    mushrooms    Prescriptions Prior to Admission  Medication Sig Dispense Refill Last Dose  . albuterol (PROVENTIL HFA;VENTOLIN HFA) 108 (90 Base) MCG/ACT inhaler Inhale 2 puffs into the lungs every 6 (six) hours as needed for wheezing or shortness of breath.   10/26/2016 at Unknown time  . citalopram (CELEXA) 20 MG tablet Take 1 tablet (20 mg total) by mouth daily. (Patient taking differently: Take 20 mg by mouth every other day. ) 30 tablet 0 Past Week at Unknown time  . diphenhydrAMINE (BENADRYL) 25 MG tablet Take 25 mg by mouth every 6 (six) hours  as needed for allergies.    Not Taking at Unknown time  . diphenhydramine-acetaminophen (TYLENOL PM) 25-500 MG TABS tablet Take 1 tablet by mouth at bedtime as needed (sleep).   Not Taking at Unknown time  . Doxylamine-Pyridoxine (DICLEGIS) 10-10 MG TBEC Take 2 tablets by mouth daily. Take 2 tablets at bedtime. If symptoms persist, add 1 tab in the AM starting on day 3. If symptoms persist, add 1 tab in the PM starting day 4. 100 tablet 1   . Prenat w/o A Vit-FeFum-FePo-FA (CONCEPT OB) 130-92.4-1 MG CAPS Take 1 tablet by mouth daily. 30  capsule 12 Past Month at Unknown time  . promethazine (PHENERGAN) 25 MG tablet Take 1 tablet (25 mg total) by mouth every 6 (six) hours as needed. (Patient taking differently: Take 25 mg by mouth every 6 (six) hours as needed for nausea or vomiting. ) 30 tablet 1 11/04/2016 at Unknown time    Review of Systems  Constitutional: Negative for chills and fever.  Gastrointestinal: Positive for abdominal pain, diarrhea (occasional loose stools ), nausea and vomiting.  Genitourinary: Negative for dysuria, vaginal bleeding and vaginal discharge.  Neurological: Positive for dizziness and headaches.   Physical Exam   Blood pressure 104/66, pulse 95, temperature 98.2 F (36.8 C), temperature source Oral, height 4\' 11"  (1.499 m), weight 134 lb (60.8 kg), last menstrual period 09/02/2016, not currently breastfeeding.  Physical Exam  MAU Course  Procedures  MDM Patient has had 1L of D5LR. She did not want any medications at that time. She wanted to see how she felt after the fluids. After the fluids she still had some shoulder pain and a slight headache. Offered her ibuprofen. She took the ibuprofen.   1205: Patient reports that she is feeling better at this time. She would like to have the list of OTC meds to buy that are similar to diclegis. We also discussed trying zofran since she is near the end of the first trimester. Risks discussed at length. She states that she would like to have an rx to try if nothing else is helping.   1210: Left message with Dr. Marvel Plan to call the unit  1215: D/W Dr. Marvel Plan, ok for dc home.    Assessment and Plan   1. Nausea and vomiting during pregnancy prior to [redacted] weeks gestation   2. [redacted] weeks gestation of pregnancy   3. Muscle strain    DC home Comfort measures reviewed  1st/2nd Trimester precautions  RX: zofran PRN #20 with 3 RF escribe to pharmacy on file.  Return to MAU as needed FU with OB as planned  Follow-up Information    Wanda Compton,  MD Follow up.   Specialty:  Obstetrics and Gynecology Contact information: [place] [address] [phone]            Wanda Nixon 11/21/2016, 9:37 AM

## 2016-11-21 NOTE — MAU Note (Signed)
Pt reports she has been having N/V of and on since pregnancy. Taking promethazine at night .Insurance will not pay for dilgleges C/o  headache  And verbalized body aches since yesterday.

## 2016-11-26 ENCOUNTER — Inpatient Hospital Stay (HOSPITAL_COMMUNITY)
Admission: AD | Admit: 2016-11-26 | Discharge: 2016-11-26 | Disposition: A | Discharge status: Home / Self Care | Payer: 59 | Source: Ambulatory Visit | Attending: Obstetrics and Gynecology | Admitting: Obstetrics and Gynecology

## 2016-11-26 ENCOUNTER — Encounter (HOSPITAL_COMMUNITY): Payer: Self-pay

## 2016-11-26 DIAGNOSIS — R0789 Other chest pain: Secondary | ICD-10-CM | POA: Insufficient documentation

## 2016-11-26 DIAGNOSIS — F419 Anxiety disorder, unspecified: Secondary | ICD-10-CM | POA: Insufficient documentation

## 2016-11-26 DIAGNOSIS — J45909 Unspecified asthma, uncomplicated: Secondary | ICD-10-CM | POA: Insufficient documentation

## 2016-11-26 DIAGNOSIS — F411 Generalized anxiety disorder: Secondary | ICD-10-CM

## 2016-11-26 DIAGNOSIS — O99341 Other mental disorders complicating pregnancy, first trimester: Secondary | ICD-10-CM | POA: Insufficient documentation

## 2016-11-26 DIAGNOSIS — Z3A12 12 weeks gestation of pregnancy: Secondary | ICD-10-CM | POA: Insufficient documentation

## 2016-11-26 DIAGNOSIS — O99511 Diseases of the respiratory system complicating pregnancy, first trimester: Secondary | ICD-10-CM | POA: Insufficient documentation

## 2016-11-26 DIAGNOSIS — O9989 Other specified diseases and conditions complicating pregnancy, childbirth and the puerperium: Secondary | ICD-10-CM

## 2016-11-26 DIAGNOSIS — O26891 Other specified pregnancy related conditions, first trimester: Secondary | ICD-10-CM | POA: Diagnosis not present

## 2016-11-26 DIAGNOSIS — R109 Unspecified abdominal pain: Secondary | ICD-10-CM | POA: Diagnosis present

## 2016-11-26 LAB — COMPREHENSIVE METABOLIC PANEL
ALBUMIN: 3.8 g/dL (ref 3.5–5.0)
ALT: 9 U/L — ABNORMAL LOW (ref 14–54)
AST: 16 U/L (ref 15–41)
Alkaline Phosphatase: 37 U/L — ABNORMAL LOW (ref 38–126)
Anion gap: 6 (ref 5–15)
BILIRUBIN TOTAL: 0.9 mg/dL (ref 0.3–1.2)
BUN: 10 mg/dL (ref 6–20)
CO2: 24 mmol/L (ref 22–32)
Calcium: 8.9 mg/dL (ref 8.9–10.3)
Chloride: 106 mmol/L (ref 101–111)
Creatinine, Ser: 0.66 mg/dL (ref 0.44–1.00)
GFR calc Af Amer: >60 mL/min (ref >60)
GFR calc non Af Amer: >60 mL/min (ref >60)
GLUCOSE: 74 mg/dL (ref 65–99)
POTASSIUM: 3.4 mmol/L — AB (ref 3.5–5.1)
SODIUM: 136 mmol/L (ref 135–145)
TOTAL PROTEIN: 6.9 g/dL (ref 6.5–8.1)

## 2016-11-26 LAB — URINALYSIS, MICROSCOPIC (REFLEX)

## 2016-11-26 LAB — URINALYSIS, ROUTINE W REFLEX MICROSCOPIC
Bilirubin Urine: NEGATIVE
Glucose, UA: NEGATIVE mg/dL
Hgb urine dipstick: NEGATIVE
KETONES UR: NEGATIVE mg/dL
NITRITE: NEGATIVE
PROTEIN: NEGATIVE mg/dL
Specific Gravity, Urine: 1.015 (ref 1.005–1.030)
pH: 8 (ref 5.0–8.0)

## 2016-11-26 LAB — CBC WITH DIFFERENTIAL/PLATELET
BASOS ABS: 0 10*3/uL (ref 0.0–0.1)
BASOS PCT: 0 %
EOS ABS: 0.1 10*3/uL (ref 0.0–0.7)
Eosinophils Relative: 1 %
HEMATOCRIT: 34.4 % — AB (ref 36.0–46.0)
HEMOGLOBIN: 12 g/dL (ref 12.0–15.0)
Lymphocytes Relative: 18 %
Lymphs Abs: 1.2 10*3/uL (ref 0.7–4.0)
MCH: 28.6 pg (ref 26.0–34.0)
MCHC: 34.9 g/dL (ref 30.0–36.0)
MCV: 82.1 fL (ref 78.0–100.0)
MONO ABS: 0.2 10*3/uL (ref 0.1–1.0)
Monocytes Relative: 3 %
NEUTROS ABS: 5 10*3/uL (ref 1.7–7.7)
NEUTROS PCT: 78 %
Platelets: 265 10*3/uL (ref 150–400)
RBC: 4.19 MIL/uL (ref 3.87–5.11)
RDW: 13.6 % (ref 11.5–15.5)
WBC: 6.5 10*3/uL (ref 4.0–10.5)

## 2016-11-26 LAB — RAPID URINE DRUG SCREEN, HOSP PERFORMED
Amphetamines: NOT DETECTED
BARBITURATES: NOT DETECTED
Benzodiazepines: NOT DETECTED
Cocaine: NOT DETECTED
OPIATES: NOT DETECTED
TETRAHYDROCANNABINOL: NOT DETECTED

## 2016-11-26 LAB — GLUCOSE, CAPILLARY: Glucose-Capillary: 78 mg/dL (ref 65–99)

## 2016-11-26 NOTE — Progress Notes (Addendum)
G3P2 @ 12.[redacted] wksga. Presents to triage for generalized pain. States SOB and chest pain with feeling heart beat.   Doppler: unable to obtain  Pt texting and talking on the phone during assessment.  Provider at bs assessing and discussing POC. BS and EKG ordered. Labs ordered  1345: provider at bs for doptones. U/s brought to room. FHR 136.   1450: Discharge instructions given with pt understanding. Pt left unit with SO via ambulatory. Work excuse form provided.

## 2016-11-26 NOTE — Discharge Instructions (Signed)

## 2016-11-26 NOTE — MAU Provider Note (Signed)
History   G3P2002 @ 12.1 wks in with c/o abd  pain, for several weeks, and since yesterday chest discomfort and irreg heart beats. States feels like this is caused by her coming off her celexa a month ago.  CSN: [id number]  Arrival date & time 11/26/16  1155   None     No chief complaint on file.   HPI  Past Medical History:  Diagnosis Date  . Asthma    Last used 08/30/14; weekly inhaler use  . Cholestasis of pregnancy in third trimester 07/25/2015  . Eczema   . Headache(784.0)    migraines  . NST (non-stress test) nonreactive   . SVD (spontaneous vaginal delivery) 07/26/2015    Past Surgical History:  Procedure Laterality Date  . MOUTH SURGERY    . TYMPANOSTOMY TUBE PLACEMENT    . WISDOM TOOTH EXTRACTION      Family History  Problem Relation Age of Onset  . Diabetes Maternal Grandmother   . Hypertension Maternal Grandmother   . Stroke Maternal Grandmother   . High blood pressure Mother   . Hypertension Mother     Social History  Substance Use Topics  . Smoking status: Never Smoker  . Smokeless tobacco: Never Used  . Alcohol use No    OB History    Gravida Para Term Preterm AB Living   3 2 2     2    SAB TAB Ectopic Multiple Live Births         0 2      Review of Systems  HENT: Negative.   Eyes: Negative.   Respiratory: Negative.   Cardiovascular: Positive for chest pain.  Gastrointestinal: Negative.   Endocrine: Negative.   Genitourinary: Negative.   Musculoskeletal: Negative.   Skin: Negative.   Allergic/Immunologic: Negative.   Neurological: Positive for weakness.  Hematological: Negative.   Psychiatric/Behavioral: Negative.     Allergies  Pulmicort [budesonide]; Tomato; and Other  Home Medications    BP 117/63 (BP Location: Right Arm)   Pulse (!) 102   Temp 98.2 F (36.8 C)   Resp 18   Ht 4\' 11"  (1.499 m)   Wt 135 lb (61.2 kg)   LMP 09/02/2016 (Exact Date)   BMI 27.27 kg/m   Physical Exam  Constitutional: She is oriented to  person, place, and time. She appears well-developed and well-nourished.  HENT:  Head: Normocephalic.  Neck: Normal range of motion.  Cardiovascular: Normal rate, regular rhythm, normal heart sounds and intact distal pulses.   Pulmonary/Chest: Effort normal and breath sounds normal.  Abdominal: Soft. Bowel sounds are normal.  Musculoskeletal: Normal range of motion.  Neurological: She is alert and oriented to person, place, and time. She has normal reflexes.  Skin: Skin is warm and dry.  Psychiatric: She has a normal mood and affect. Her behavior is normal. Judgment and thought content normal.    MAU Course  Procedures (including critical care time)  Labs Reviewed  URINALYSIS, ROUTINE W REFLEX MICROSCOPIC   No results found.   Dx : chest pain anxiety Early pregnancy   MDM  VSS, Heart RRR, LCTAB, abd soft non tender. FHR 152 per doppler st and regular. CBC WNL , CMP , EKG WNL . POC discussed with Dr. Melba Coon to d/c pt home.

## 2016-11-26 NOTE — MAU Note (Addendum)
Stopped taking Celexa because of pregnancy, having some issues, heart was skipping beats last night, SOB, abdominal cramping and legs are hurting. Had spotting last night as well after intercourse. Patient has not gotten her prescriptions since last visit.  Got paid today so planning to get them.

## 2016-12-11 ENCOUNTER — Other Ambulatory Visit: Payer: Self-pay | Admitting: Physician Assistant

## 2016-12-11 DIAGNOSIS — F411 Generalized anxiety disorder: Secondary | ICD-10-CM

## 2016-12-22 ENCOUNTER — Inpatient Hospital Stay (HOSPITAL_COMMUNITY)
Admission: AD | Admit: 2016-12-22 | Discharge: 2016-12-22 | Disposition: A | Discharge status: Home / Self Care | Payer: 59 | Source: Ambulatory Visit | Attending: Obstetrics and Gynecology | Admitting: Obstetrics and Gynecology

## 2016-12-22 ENCOUNTER — Encounter (HOSPITAL_COMMUNITY): Payer: Self-pay

## 2016-12-22 DIAGNOSIS — Z3A15 15 weeks gestation of pregnancy: Secondary | ICD-10-CM | POA: Diagnosis not present

## 2016-12-22 DIAGNOSIS — R109 Unspecified abdominal pain: Secondary | ICD-10-CM | POA: Diagnosis not present

## 2016-12-22 DIAGNOSIS — O26612 Liver and biliary tract disorders in pregnancy, second trimester: Secondary | ICD-10-CM | POA: Diagnosis not present

## 2016-12-22 DIAGNOSIS — O99512 Diseases of the respiratory system complicating pregnancy, second trimester: Secondary | ICD-10-CM | POA: Insufficient documentation

## 2016-12-22 DIAGNOSIS — O209 Hemorrhage in early pregnancy, unspecified: Secondary | ICD-10-CM

## 2016-12-22 DIAGNOSIS — F411 Generalized anxiety disorder: Secondary | ICD-10-CM | POA: Diagnosis not present

## 2016-12-22 DIAGNOSIS — G44209 Tension-type headache, unspecified, not intractable: Secondary | ICD-10-CM

## 2016-12-22 DIAGNOSIS — O26892 Other specified pregnancy related conditions, second trimester: Secondary | ICD-10-CM | POA: Diagnosis present

## 2016-12-22 DIAGNOSIS — O212 Late vomiting of pregnancy: Secondary | ICD-10-CM | POA: Diagnosis not present

## 2016-12-22 DIAGNOSIS — R51 Headache: Secondary | ICD-10-CM | POA: Diagnosis present

## 2016-12-22 DIAGNOSIS — O99342 Other mental disorders complicating pregnancy, second trimester: Secondary | ICD-10-CM | POA: Diagnosis present

## 2016-12-22 DIAGNOSIS — J45909 Unspecified asthma, uncomplicated: Secondary | ICD-10-CM | POA: Insufficient documentation

## 2016-12-22 DIAGNOSIS — R1030 Lower abdominal pain, unspecified: Secondary | ICD-10-CM

## 2016-12-22 LAB — URINALYSIS, ROUTINE W REFLEX MICROSCOPIC
BILIRUBIN URINE: NEGATIVE
Glucose, UA: NEGATIVE mg/dL
Hgb urine dipstick: NEGATIVE
KETONES UR: NEGATIVE mg/dL
Nitrite: NEGATIVE
PH: 7 (ref 5.0–8.0)
Protein, ur: NEGATIVE mg/dL
Specific Gravity, Urine: 1.024 (ref 1.005–1.030)

## 2016-12-22 MED ORDER — DIPHENHYDRAMINE HCL 50 MG/ML IJ SOLN
25.0000 mg | Freq: Once | INTRAMUSCULAR | Status: AC
  Administered 2016-12-22: 25 mg via INTRAMUSCULAR
  Filled 2016-12-22: qty 1

## 2016-12-22 MED ORDER — DIPHENHYDRAMINE HCL 50 MG/ML IJ SOLN: 25.0000 mg | Freq: Once | INTRAMUSCULAR | Status: DC

## 2016-12-22 MED ORDER — METOCLOPRAMIDE HCL 5 MG/ML IJ SOLN: 10.0000 mg | Freq: Once | INTRAMUSCULAR | Status: DC

## 2016-12-22 MED ORDER — KETOROLAC TROMETHAMINE 30 MG/ML IJ SOLN
30.0000 mg | Freq: Once | INTRAMUSCULAR | Status: AC
  Administered 2016-12-22: 30 mg via INTRAMUSCULAR
  Filled 2016-12-22: qty 1

## 2016-12-22 MED ORDER — CITALOPRAM HYDROBROMIDE 20 MG PO TABS: 20.0000 mg | ORAL_TABLET | Freq: Every day | ORAL | 0 refills | Status: DC

## 2016-12-22 MED ORDER — METOCLOPRAMIDE HCL 5 MG/ML IJ SOLN
10.0000 mg | Freq: Once | INTRAMUSCULAR | Status: AC
  Administered 2016-12-22: 10 mg via INTRAMUSCULAR
  Filled 2016-12-22: qty 2

## 2016-12-22 MED ORDER — ACETAMINOPHEN 500 MG PO TABS
1000.0000 mg | ORAL_TABLET | Freq: Four times a day (QID) | ORAL | Status: DC | PRN
  Administered 2016-12-22: 1000 mg via ORAL
  Filled 2016-12-22: qty 2

## 2016-12-22 MED ORDER — LACTATED RINGERS IV BOLUS (SEPSIS): 1000.0000 mL | Freq: Once | INTRAVENOUS | Status: DC

## 2016-12-22 NOTE — Progress Notes (Signed)
Pt doesn't think she can hold po meds down, would prefer an injections.  Eusebio Me CNM informed.

## 2016-12-22 NOTE — Discharge Instructions (Signed)
Vaginal Bleeding During Pregnancy, Second Trimester A small amount of bleeding (spotting) from the vagina is common in pregnancy. Sometimes the bleeding is normal and is not a problem, and sometimes it is a sign of something serious. Be sure to tell your doctor about any bleeding from your vagina right away. Follow these instructions at home:  Watch your condition for any changes.  Follow your doctor's instructions about how active you can be.  If you are on bed rest: ? You may need to stay in bed and only get up to use the bathroom. ? You may be allowed to do some activities. ? If you need help, make plans for someone to help you.  Write down: ? The number of pads you use each day. ? How often you change pads. ? How soaked (saturated) your pads are.  Do not use tampons.  Do not douche.  Do not have sex or orgasms until your doctor says it is okay.  If you pass any tissue from your vagina, save the tissue so you can show it to your doctor.  Only take medicines as told by your doctor.  Do not take aspirin because it can make you bleed.  Do not exercise, lift heavy weights, or do any activities that take a lot of energy and effort unless your doctor says it is okay.  Keep all follow-up visits as told by your doctor. Contact a doctor if:  You bleed from your vagina.  You have cramps.  You have labor pains.  You have a fever that does not go away after you take medicine. Get help right away if:  You have very bad cramps in your back or belly (abdomen).  You have contractions.  You have chills.  You pass large clots or tissue from your vagina.  You bleed more.  You feel light-headed or weak.  You pass out (faint).  You are leaking fluid or have a gush of fluid from your vagina. This information is not intended to replace advice given to you by your health care provider. Make sure you discuss any questions you have with your health care provider. Document  Released: 09/02/2013 Document Revised: 09/24/2015 Document Reviewed: 12/24/2012 Elsevier Interactive Patient Education  2018 [employer] Inc[employer]  

## 2016-12-22 NOTE — MAU Provider Note (Signed)
History     CSN: [id number]  Arrival date and time: 12/22/16 3664   First Provider Initiated Contact with Patient 12/22/16 0703      Chief Complaint  Patient presents with  . Headache  . Anxiety  . Abdominal Pain   G3P2002 @15 .6 wks here with HA and worsening anxiety. HA started 4 days ago. Describes as frontal, behind eyes. Associated sx are N/V, visual disturbances, and insomnia-has not slept in 2 days. Rates pain 10/10. Tried Ibuprofen and had little relief. Report increased stress at work which has worsened her anxiety and had panic attack yesterday. She was originally on Celexa but stopped it 2 mos ago d/t fear of adverse effects on fetus. She denies SI/HI but feels hopeless at times. Reports continued intermittent abdominal pain and spotting during the pregnancy but not new sx.    OB History    Gravida Para Term Preterm AB Living   3 2 2  0   2   SAB TAB Ectopic Multiple Live Births         0 2      Past Medical History:  Diagnosis Date  . Asthma    Last used 08/30/14; weekly inhaler use  . Cholestasis of pregnancy in third trimester 07/25/2015  . Eczema   . Headache(784.0)    migraines  . NST (non-stress test) nonreactive   . SVD (spontaneous vaginal delivery) 07/26/2015    Past Surgical History:  Procedure Laterality Date  . MOUTH SURGERY    . TYMPANOSTOMY TUBE PLACEMENT    . WISDOM TOOTH EXTRACTION      Family History  Problem Relation Age of Onset  . Diabetes Maternal Grandmother   . Hypertension Maternal Grandmother   . Stroke Maternal Grandmother   . High blood pressure Mother   . Hypertension Mother     Social History  Substance Use Topics  . Smoking status: Never Smoker  . Smokeless tobacco: Never Used  . Alcohol use No    Allergies:  Allergies  Allergen Reactions  . Pulmicort [Budesonide] Palpitations    Racing heart  . Tomato Hives and Itching  . Other Hives    mushrooms    Review of Systems  Eyes: Positive for photophobia and  visual disturbance.  Gastrointestinal: Positive for abdominal pain, nausea and vomiting.  Genitourinary: Positive for vaginal bleeding.  Neurological: Positive for headaches.  Psychiatric/Behavioral: Positive for sleep disturbance. The patient is nervous/anxious.    Physical Exam   Blood pressure 117/65, pulse 92, temperature 97.8 F (36.6 C), temperature source Oral, resp. rate 18, height 4\' 11"  (1.499 m), weight 135 lb (61.2 kg), last menstrual period 09/02/2016, not currently breastfeeding.  Physical Exam  Constitutional: She is oriented to person, place, and time. She appears well-developed and well-nourished. No distress.  HENT:  Head: Normocephalic and atraumatic.  Neck: Normal range of motion.  Respiratory: Effort normal. No respiratory distress.  Genitourinary:  Genitourinary Comments: NEFG, no blood visualized in the vagina. Clumpy white discharge present consistent with current yeast infection. No CMT, positive suprapubic tenderness with deep palpation.   Musculoskeletal: Normal range of motion.  Neurological: She is alert and oriented to person, place, and time.  Skin: Skin is warm and dry.  Psychiatric: She has a normal mood and affect.  FHT 152 bpm  Results for orders placed or performed during the hospital encounter of 12/22/16 (from the past 24 hour(s))  Urinalysis, Routine w reflex microscopic     Status: Abnormal   Collection Time: 12/22/16  6:14 AM  Result Value Ref Range   Color, Urine YELLOW YELLOW   APPearance HAZY (A) CLEAR   Specific Gravity, Urine 1.024 1.005 - 1.030   pH 7.0 5.0 - 8.0   Glucose, UA NEGATIVE NEGATIVE mg/dL   Hgb urine dipstick NEGATIVE NEGATIVE   Bilirubin Urine NEGATIVE NEGATIVE   Ketones, ur NEGATIVE NEGATIVE mg/dL   Protein, ur NEGATIVE NEGATIVE mg/dL   Nitrite NEGATIVE NEGATIVE   Leukocytes, UA MODERATE (A) NEGATIVE   RBC / HPF 0-5 0 - 5 RBC/hpf   WBC, UA 0-5 0 - 5 WBC/hpf   Bacteria, UA RARE (A) NONE SEEN   Squamous Epithelial /  LPF 0-5 (A) NONE SEEN   Mucus PRESENT     MAU Course  Procedures  Transfer of care given to Marcie Mowers, CNM  12/22/2016 8:10 AM    Reglan IM Tylenol PO: patient vomited after taking Benadryl IM Toradol IM:  Attempted to start IV 4 times; on 4th time IV was placed successfully but patient requested it be removed due to discomfort.  GC CT collected, patient recently had a wet prep and is being treated for yeast and BV.  Patient now feeling better; HA and abdominal pain now a 3/10.     Assessment and Plan    1. Acute non intractable tension-type headache   2. Anxiety state   3. Lower abdominal pain   4. Vaginal bleeding before [redacted] weeks gestation    2. Patient feeling better; stable for discharge with RX for celexa. GC CT and UC pending.  3. Plan of care discussed with Dr. Willis Modena, who agrees.  4. Patient plans to make appointment for psychotherapist to help with her anxiety.

## 2016-12-22 NOTE — MAU Note (Signed)
Pt stated that she has had a migraine x 4 days. Taking ibuprofen per MD and has not helped. Has had a lot going on at work and had a panic attack yesterday and is feeling anxious on and off. Was taking Celexa for her anxiety but stopped taking it when she found out she was pregnant. Had been doing ok up until the last week. C/o ab cramping on and off as well. Reports spotting but stated that she has had spotting off and on the whole pregnancy so far.

## 2016-12-22 NOTE — Progress Notes (Signed)
3 unsuccessful attempts, one in each antecubital, one in R hand.  IV started successfully by C. Quentin Cornwall RN in R wrist, IV flowing well with no signs of infiltration, pt requested IV be removed.  IV DC'd, CNM informed, will try po meds.

## 2016-12-23 LAB — GC/CHLAMYDIA PROBE AMP (~~LOC~~) NOT AT ARMC
Chlamydia: NEGATIVE
Neisseria Gonorrhea: NEGATIVE

## 2016-12-23 LAB — URINE CULTURE

## 2017-01-16 ENCOUNTER — Encounter (HOSPITAL_COMMUNITY): Payer: Self-pay

## 2017-01-16 ENCOUNTER — Inpatient Hospital Stay (HOSPITAL_COMMUNITY)
Admission: AD | Admit: 2017-01-16 | Discharge: 2017-01-16 | Disposition: A | Discharge status: Home / Self Care | Payer: 59 | Source: Ambulatory Visit | Attending: Obstetrics and Gynecology | Admitting: Obstetrics and Gynecology

## 2017-01-16 DIAGNOSIS — R112 Nausea with vomiting, unspecified: Secondary | ICD-10-CM | POA: Insufficient documentation

## 2017-01-16 DIAGNOSIS — F419 Anxiety disorder, unspecified: Secondary | ICD-10-CM | POA: Insufficient documentation

## 2017-01-16 DIAGNOSIS — Z9189 Other specified personal risk factors, not elsewhere classified: Secondary | ICD-10-CM

## 2017-01-16 DIAGNOSIS — Z3A00 Weeks of gestation of pregnancy not specified: Secondary | ICD-10-CM | POA: Insufficient documentation

## 2017-01-16 DIAGNOSIS — R42 Dizziness and giddiness: Secondary | ICD-10-CM | POA: Diagnosis not present

## 2017-01-16 DIAGNOSIS — O219 Vomiting of pregnancy, unspecified: Secondary | ICD-10-CM | POA: Diagnosis not present

## 2017-01-16 DIAGNOSIS — J45909 Unspecified asthma, uncomplicated: Secondary | ICD-10-CM | POA: Insufficient documentation

## 2017-01-16 DIAGNOSIS — O99519 Diseases of the respiratory system complicating pregnancy, unspecified trimester: Secondary | ICD-10-CM | POA: Diagnosis not present

## 2017-01-16 DIAGNOSIS — O26899 Other specified pregnancy related conditions, unspecified trimester: Secondary | ICD-10-CM | POA: Insufficient documentation

## 2017-01-16 LAB — URINALYSIS, ROUTINE W REFLEX MICROSCOPIC
Bilirubin Urine: NEGATIVE
Glucose, UA: NEGATIVE mg/dL
Hgb urine dipstick: NEGATIVE
Ketones, ur: NEGATIVE mg/dL
Nitrite: NEGATIVE
Protein, ur: NEGATIVE mg/dL
Specific Gravity, Urine: 1.023 (ref 1.005–1.030)
pH: 6 (ref 5.0–8.0)

## 2017-01-16 MED ORDER — METOCLOPRAMIDE HCL 5 MG/ML IJ SOLN
10.0000 mg | Freq: Once | INTRAMUSCULAR | Status: AC
  Administered 2017-01-16: 10 mg via INTRAMUSCULAR
  Filled 2017-01-16: qty 2

## 2017-01-16 MED ORDER — HYDROXYZINE PAMOATE 25 MG PO CAPS: 25.0000 mg | ORAL_CAPSULE | Freq: Three times a day (TID) | ORAL | 0 refills | Status: DC | PRN

## 2017-01-16 MED ORDER — METOCLOPRAMIDE HCL 5 MG/ML IJ SOLN: 10.0000 mg | Freq: Once | INTRAMUSCULAR | Status: DC

## 2017-01-16 MED ORDER — METOCLOPRAMIDE HCL 10 MG PO TABS: 10.0000 mg | ORAL_TABLET | Freq: Three times a day (TID) | ORAL | 1 refills | Status: DC

## 2017-01-16 NOTE — MAU Provider Note (Signed)
History     CSN: [id number]  Arrival date and time: 01/16/17 1556   First Provider Initiated Contact with Patient 01/16/17 1652      Chief Complaint  Patient presents with  . Emesis  . Dizziness   HPI   [name] is a 26 y.o. female G10P2002 is 26 y.o. here in MAU with dizziness and continued nausea and vomiting. States she is unable to keep anything down.  States she was at work when she started feeling bad. She did not pass out.  States she called the DR. Office and she was told to come here.  States the only thing she has eaten today was chicken noodle soup and states she has vomited that up. States she is losing weight (chart shows she has gained weight) 129 lbs-136. States she feels her symptoms are worse today after taking phenergan last night at 7pm. Says she has taken phenergan in the past and has never felt these symptoms from taking it. Has felt bad the whole pregnancy.   Patient has multiple visits to the MAU and multiple lifetime visits for anxiety.    OB History    Gravida Para Term Preterm AB Living   3 2 2  0   2   SAB TAB Ectopic Multiple Live Births         0 2      Past Medical History:  Diagnosis Date  . Asthma    Last used 08/30/14; weekly inhaler use  . Cholestasis of pregnancy in third trimester 07/25/2015  . Eczema   . Headache(784.0)    migraines  . NST (non-stress test) nonreactive   . SVD (spontaneous vaginal delivery) 07/26/2015    Past Surgical History:  Procedure Laterality Date  . MOUTH SURGERY    . TYMPANOSTOMY TUBE PLACEMENT    . WISDOM TOOTH EXTRACTION      Family History  Problem Relation Age of Onset  . Diabetes Maternal Grandmother   . Hypertension Maternal Grandmother   . Stroke Maternal Grandmother   . High blood pressure Mother   . Hypertension Mother     Social History  Substance Use Topics  . Smoking status: Never Smoker  . Smokeless tobacco: Never Used  . Alcohol use No    Allergies:  Allergies   Allergen Reactions  . Pulmicort [Budesonide] Palpitations    Racing heart  . Tomato Hives and Itching  . Other Hives    mushrooms  . Promethazine Rash    dizziness    Prescriptions Prior to Admission  Medication Sig Dispense Refill Last Dose  . citalopram (CELEXA) 20 MG tablet Take 1 tablet (20 mg total) by mouth daily. 30 tablet 0 01/16/2017 at Unknown time  . Multiple Vitamin (MULTIVITAMIN WITH MINERALS) TABS tablet Take 1 tablet by mouth daily.   01/15/2017 at Unknown time  . ondansetron (ZOFRAN ODT) 8 MG disintegrating tablet Take 1 tablet (8 mg total) by mouth every 8 (eight) hours as needed for nausea or vomiting. (Patient not taking: Reported on 01/16/2017) 20 tablet 3 Not Taking at Unknown time   Results for orders placed or performed during the hospital encounter of 01/16/17 (from the past 48 hour(s))  Urinalysis, Routine w reflex microscopic     Status: Abnormal   Collection Time: 01/16/17  4:06 PM  Result Value Ref Range   Color, Urine YELLOW YELLOW   APPearance HAZY (A) CLEAR   Specific Gravity, Urine 1.023 1.005 - 1.030   pH 6.0 5.0 - 8.0  Glucose, UA NEGATIVE NEGATIVE mg/dL   Hgb urine dipstick NEGATIVE NEGATIVE   Bilirubin Urine NEGATIVE NEGATIVE   Ketones, ur NEGATIVE NEGATIVE mg/dL   Protein, ur NEGATIVE NEGATIVE mg/dL   Nitrite NEGATIVE NEGATIVE   Leukocytes, UA TRACE (A) NEGATIVE   RBC / HPF 0-5 0 - 5 RBC/hpf   WBC, UA 0-5 0 - 5 WBC/hpf   Bacteria, UA RARE (A) NONE SEEN   Squamous Epithelial / LPF 0-5 (A) NONE SEEN   Mucus PRESENT    Review of Systems  Constitutional: Positive for fatigue.  Gastrointestinal: Negative for abdominal pain.  Genitourinary: Negative for vaginal bleeding.  Neurological: Positive for dizziness.   Physical Exam   Blood pressure 117/68, pulse 93, temperature 98.2 F (36.8 C), temperature source Oral, resp. rate 15, height 4\' 11"  (1.499 m), weight 136 lb (61.7 kg), last menstrual period 09/02/2016, SpO2 100 %, not currently  breastfeeding.  Physical Exam  Constitutional: She is oriented to person, place, and time. She appears well-developed and well-nourished.  Non-toxic appearance. She does not have a sickly appearance. She does not appear ill. No distress.  HENT:  Head: Normocephalic.  Respiratory: Effort normal and breath sounds normal.  Musculoskeletal: Normal range of motion.  Neurological: She is alert and oriented to person, place, and time.  Skin: Skin is warm. She is not diaphoretic.  Psychiatric: Her mood appears anxious. Cognition and memory are not impaired. She expresses no suicidal ideation.    MAU Course  Procedures  None  MDM  + fetal heart tones via doppler  Patient requesting peanut butter, crackers and water.  UA without signs of dehydration.  Pulse ox 100 % on RA Oral hydration encouraged in MAU Orthostatic vitals: WNL Reglan 10 mg IM X1 Unlikely a reaction secondary to phenergan. Patient has used phenergan several times in this pregnancy and previous pregnancies.  Very faint, localized rash on left antecubital area.  Discussed patient with Dr. Terri Piedra Assessment and Plan   A:  1. Episode of dizziness   2. Has poorly balanced diet   3. Nausea and vomiting during pregnancy   4. Anxiousness     P:  Discharge home in stable condition Dizziness likely secondary to poor diet Discussed the importance of eating small meals throughout the day Increase PO fluid intake Contact therapist to discuss anxiety Rx: Reglan, vistaril  Follow up with OB and return to MAU for emergencies only  Tylia Ewell, Artist Pais, NP 01/16/2017 6:24 PM

## 2017-01-16 NOTE — Discharge Instructions (Signed)
Dizziness Dizziness is a common problem. It makes you feel unsteady or light-headed. You may feel like you are about to pass out (faint). Dizziness can lead to getting hurt if you stumble or fall. Dizziness can be caused by many things, including:  Medicines.  Not having enough water in your body (dehydration).  Illness.  Follow these instructions at home: Eating and drinking  Drink enough fluid to keep your pee (urine) clear or pale yellow. This helps to keep you from getting dehydrated. Try to drink more clear fluids, such as water.  Do not drink alcohol.  Limit how much caffeine you drink or eat, if your doctor tells you to do that.  Limit how much salt (sodium) you drink or eat, if your doctor tells you to do that. Activity  Avoid making quick movements. ? When you stand up from sitting in a chair, steady yourself until you feel okay. ? In the morning, first sit up on the side of the bed. When you feel okay, stand slowly while you hold onto something. Do this until you know that your balance is fine.  If you need to stand in one place for a long time, move your legs often. Tighten and relax the muscles in your legs while you are standing.  Do not drive or use heavy machinery if you feel dizzy.  Avoid bending down if you feel dizzy. Place items in your home so you can reach them easily without leaning over. Lifestyle  Do not use any products that contain nicotine or tobacco, such as cigarettes and e-cigarettes. If you need help quitting, ask your doctor.  Try to lower your stress level. You can do this by using methods such as yoga or meditation. Talk with your doctor if you need help. General instructions  Watch your dizziness for any changes.  Take over-the-counter and prescription medicines only as told by your doctor. Talk with your doctor if you think that you are dizzy because of a medicine that you are taking.  Tell a friend or a family member that you are feeling  dizzy. If he or she notices any changes in your behavior, have this person call your doctor.  Keep all follow-up visits as told by your doctor. This is important. Contact a doctor if:  Your dizziness does not go away.  Your dizziness or light-headedness gets worse.  You feel sick to your stomach (nauseous).  You have trouble hearing.  You have new symptoms.  You are unsteady on your feet.  You feel like the room is spinning. Get help right away if:  You throw up (vomit) or have watery poop (diarrhea), and you cannot eat or drink anything.  You have trouble: ? Talking. ? Walking. ? Swallowing. ? Using your arms, hands, or legs.  You feel generally weak.  You are not thinking clearly, or you have trouble forming sentences. A friend or family member may notice this.  You have: ? Chest pain. ? Pain in your belly (abdomen). ? Shortness of breath. ? Sweating.  Your vision changes.  You are bleeding.  You have a very bad headache.  You have neck pain or a stiff neck.  You have a fever. These symptoms may be an emergency. Do not wait to see if the symptoms will go away. Get medical help right away. Call your local emergency services (911 in the [place] Do not drive yourself to the hospital. Summary  Dizziness makes you feel unsteady or light-headed. You may  feel like you are about to pass out (faint).  Drink enough fluid to keep your pee (urine) clear or pale yellow. Do not drink alcohol.  Avoid making quick movements if you feel dizzy.  Watch your dizziness for any changes. This information is not intended to replace advice given to you by your health care provider. Make sure you discuss any questions you have with your health care provider. Document Released: 04/07/2011 Document Revised: 05/05/2016 Document Reviewed: 05/05/2016 Elsevier Interactive Patient Education  2017 [place] Diet A bland diet consists of foods that do not have a lot of fat or  fiber. Foods without fat or fiber are easier for the body to digest. They are also less likely to irritate your mouth, throat, stomach, and other parts of your gastrointestinal tract. A bland diet is sometimes called a BRAT diet. What is my plan? Your health care provider or dietitian may recommend specific changes to your diet to prevent and treat your symptoms, such as:  Eating small meals often.  Cooking food until it is soft enough to chew easily.  Chewing your food well.  Drinking fluids slowly.  Not eating foods that are very spicy, sour, or fatty.  Not eating citrus fruits, such as oranges and grapefruit.  What do I need to know about this diet?  Eat a variety of foods from the bland diet food list.  Do not follow a bland diet longer than you have to.  Ask your health care provider whether you should take vitamins. What foods can I eat? Grains  Hot cereals, such as cream of wheat. Bread, crackers, or tortillas made from refined white flour. Rice. Vegetables Canned or cooked vegetables. Mashed or boiled potatoes. Fruits Bananas. Applesauce. Other types of cooked or canned fruit with the skin and seeds removed, such as canned peaches or pears. Meats and Other Protein Sources Scrambled eggs. Creamy peanut butter or other nut butters. Lean, well-cooked meats, such as chicken or fish. Tofu. Soups or broths. Dairy Low-fat dairy products, such as milk, cottage cheese, or yogurt. Beverages Water. Herbal tea. Apple juice. Sweets and Desserts Pudding. Custard. Fruit gelatin. Ice cream. Fats and Oils Mild salad dressings. Canola or olive oil. The items listed above may not be a complete list of allowed foods or beverages. Contact your dietitian for more options. What foods are not recommended? Foods and ingredients that are often not recommended include:  Spicy foods, such as hot sauce or salsa.  Fried foods.  Sour foods, such as pickled or fermented foods.  Raw  vegetables or fruits, especially citrus or berries.  Caffeinated drinks.  Alcohol.  Strongly flavored seasonings or condiments.  The items listed above may not be a complete list of foods and beverages that are not allowed. Contact your dietitian for more information. This information is not intended to replace advice given to you by your health care provider. Make sure you discuss any questions you have with your health care provider. Document Released: 08/10/2015 Document Revised: 09/24/2015 Document Reviewed: 04/30/2014 Elsevier Interactive Patient Education  2018 [employer].

## 2017-01-16 NOTE — MAU Note (Signed)
Pt reports she took phenergan yesterday at 7pm and today she has felt very dizzy and has noticed a rash on her arms and hands. States she has not been able to keep anything down.

## 2017-02-09 ENCOUNTER — Telehealth: Payer: Self-pay | Admitting: Advanced Practice Midwife

## 2017-02-09 ENCOUNTER — Encounter (HOSPITAL_COMMUNITY): Payer: Self-pay | Admitting: Emergency Medicine

## 2017-02-09 ENCOUNTER — Inpatient Hospital Stay (HOSPITAL_COMMUNITY)
Admission: AD | Admit: 2017-02-09 | Discharge: 2017-02-09 | Disposition: A | Discharge status: Home / Self Care | Payer: 59 | Source: Ambulatory Visit | Attending: Obstetrics and Gynecology | Admitting: Obstetrics and Gynecology

## 2017-02-09 DIAGNOSIS — N390 Urinary tract infection, site not specified: Secondary | ICD-10-CM | POA: Insufficient documentation

## 2017-02-09 DIAGNOSIS — O99512 Diseases of the respiratory system complicating pregnancy, second trimester: Secondary | ICD-10-CM | POA: Insufficient documentation

## 2017-02-09 DIAGNOSIS — O9989 Other specified diseases and conditions complicating pregnancy, childbirth and the puerperium: Secondary | ICD-10-CM | POA: Insufficient documentation

## 2017-02-09 DIAGNOSIS — O2342 Unspecified infection of urinary tract in pregnancy, second trimester: Secondary | ICD-10-CM

## 2017-02-09 DIAGNOSIS — R102 Pelvic and perineal pain: Secondary | ICD-10-CM | POA: Insufficient documentation

## 2017-02-09 DIAGNOSIS — G43909 Migraine, unspecified, not intractable, without status migrainosus: Secondary | ICD-10-CM | POA: Diagnosis not present

## 2017-02-09 DIAGNOSIS — Z3A22 22 weeks gestation of pregnancy: Secondary | ICD-10-CM | POA: Diagnosis not present

## 2017-02-09 DIAGNOSIS — O26612 Liver and biliary tract disorders in pregnancy, second trimester: Secondary | ICD-10-CM | POA: Insufficient documentation

## 2017-02-09 DIAGNOSIS — K831 Obstruction of bile duct: Secondary | ICD-10-CM | POA: Insufficient documentation

## 2017-02-09 DIAGNOSIS — O26892 Other specified pregnancy related conditions, second trimester: Secondary | ICD-10-CM | POA: Diagnosis not present

## 2017-02-09 LAB — URINALYSIS, ROUTINE W REFLEX MICROSCOPIC
BILIRUBIN URINE: NEGATIVE
Bacteria, UA: NONE SEEN
GLUCOSE, UA: NEGATIVE mg/dL
HGB URINE DIPSTICK: NEGATIVE
KETONES UR: NEGATIVE mg/dL
NITRITE: NEGATIVE
PH: 6 (ref 5.0–8.0)
PROTEIN: 30 mg/dL — AB
Specific Gravity, Urine: 1.027 (ref 1.005–1.030)

## 2017-02-09 LAB — WET PREP, GENITAL
CLUE CELLS WET PREP: NONE SEEN
Sperm: NONE SEEN
Trich, Wet Prep: NONE SEEN
Yeast Wet Prep HPF POC: NONE SEEN

## 2017-02-09 MED ORDER — DIPHENHYDRAMINE HCL 50 MG/ML IJ SOLN
25.0000 mg | Freq: Once | INTRAMUSCULAR | Status: AC
  Administered 2017-02-09: 25 mg via INTRAVENOUS
  Filled 2017-02-09: qty 1

## 2017-02-09 MED ORDER — LACTATED RINGERS IV BOLUS (SEPSIS)
1000.0000 mL | Freq: Once | INTRAVENOUS | Status: AC
  Administered 2017-02-09: 1000 mL via INTRAVENOUS

## 2017-02-09 MED ORDER — SULFAMETHOXAZOLE-TRIMETHOPRIM 800-160 MG PO TABS: 1.0000 | ORAL_TABLET | Freq: Two times a day (BID) | ORAL | 0 refills | Status: DC

## 2017-02-09 MED ORDER — METOCLOPRAMIDE HCL 5 MG/ML IJ SOLN
10.0000 mg | Freq: Once | INTRAMUSCULAR | Status: AC
  Administered 2017-02-09: 10 mg via INTRAVENOUS

## 2017-02-09 NOTE — MAU Provider Note (Signed)
Chief Complaint:  Abdominal Pain; Contractions; and Headache   First Provider Initiated Contact with Patient 02/09/17 1523      HPI: Wanda Nixon is a 26 y.o. G3P2002 at [redacted]w[redacted]d who presents to maternity admissions reporting pelvic/vaginal pressure and h/a x 3 days. She reports the pelvic pain is constant and worsening gradually since onset. She has tried rest and warm showers but nothing helps. The pain is associated with light spotting x 1 episode today when wiping.  There are no other associated symptoms besides h/a. She has hx of migraines and reports this h/a is similar, starting in the front and radiating to the back of her head, mostly on the left side and associated with photophobia.  She has tried Tylenol but it has not helped.   She reports good fetal movement, denies LOF, vaginal itching/burning, urinary symptoms, dizziness, n/v, or fever/chills.    HPI  Past Medical History: Past Medical History:  Diagnosis Date  . Asthma    Last used 08/30/14; weekly inhaler use  . Cholestasis of pregnancy in third trimester 07/25/2015  . Eczema   . Headache(784.0)    migraines  . NST (non-stress test) nonreactive   . SVD (spontaneous vaginal delivery) 07/26/2015    Past obstetric history: OB History  Gravida Para Term Preterm AB Living  3 2 2  0   2  SAB TAB Ectopic Multiple Live Births        0 2    # Outcome Date GA Lbr Len/2nd Weight Sex Delivery Anes PTL Lv  3 Current           2 Term 07/26/15 [redacted]w[redacted]d 03:17 / 00:04 5 lb 7.7 oz (2.485 kg) M Vag-Spont EPI  LIV  1 Term 08/31/14 [redacted]w[redacted]d 14:24 / 00:12 5 lb 12.4 oz (2.62 kg) F Vag-Spont EPI  LIV      Past Surgical History: Past Surgical History:  Procedure Laterality Date  . MOUTH SURGERY    . TYMPANOSTOMY TUBE PLACEMENT    . WISDOM TOOTH EXTRACTION      Family History: Family History  Problem Relation Age of Onset  . Diabetes Maternal Grandmother   . Hypertension Maternal Grandmother   . Stroke Maternal Grandmother   . High  blood pressure Mother   . Hypertension Mother     Social History: Social History  Substance Use Topics  . Smoking status: Never Smoker  . Smokeless tobacco: Never Used  . Alcohol use No    Allergies:  Allergies  Allergen Reactions  . Pulmicort [Budesonide] Palpitations    Racing heart  . Tomato Hives and Itching  . Other Hives    mushrooms  . Promethazine Rash    dizziness    Meds:  No prescriptions prior to admission.    ROS:  Review of Systems  Constitutional: Negative for chills, fatigue and fever.  Eyes: Positive for photophobia. Negative for visual disturbance.  Respiratory: Negative for shortness of breath.   Cardiovascular: Negative for chest pain.  Gastrointestinal: Positive for abdominal pain. Negative for nausea and vomiting.  Genitourinary: Positive for pelvic pain and vaginal bleeding. Negative for difficulty urinating, dysuria, flank pain, vaginal discharge and vaginal pain.  Neurological: Positive for headaches. Negative for dizziness.  Psychiatric/Behavioral: Negative.      I have reviewed patient's Past Medical Hx, Surgical Hx, Family Hx, Social Hx, medications and allergies.   Physical Exam  Patient Vitals for the past 24 hrs:  BP Temp Temp src Pulse Resp SpO2  02/09/17 1744 113/81 - -  98 16 -  02/09/17 1507 115/68 98.9 F (37.2 C) Oral 96 16 100 %   Constitutional: Well-developed, well-nourished female in no acute distress.  Cardiovascular: normal rate Respiratory: normal effort GI: Abd soft, non-tender, gravid appropriate for gestational age.  MS: Extremities nontender, no edema, normal ROM Neurologic: Alert and oriented x 4.  GU: Neg CVAT.  PELVIC EXAM: Cervix pink, visually closed, without lesion, scant white creamy discharge, vaginal walls and external genitalia normal  Cervix 0/long/high, firm, posterior, positive for CMT versus generalized uterine tenderness, adnexa difficult to palpate but no enlargement or mass palpable  bilaterally     FHT: 155 by doppler   Labs: Results for orders placed or performed during the hospital encounter of 02/09/17 (from the past 24 hour(s))  Urinalysis, Routine w reflex microscopic     Status: Abnormal   Collection Time: 02/09/17  2:57 PM  Result Value Ref Range   Color, Urine YELLOW YELLOW   APPearance HAZY (A) CLEAR   Specific Gravity, Urine 1.027 1.005 - 1.030   pH 6.0 5.0 - 8.0   Glucose, UA NEGATIVE NEGATIVE mg/dL   Hgb urine dipstick NEGATIVE NEGATIVE   Bilirubin Urine NEGATIVE NEGATIVE   Ketones, ur NEGATIVE NEGATIVE mg/dL   Protein, ur 30 (A) NEGATIVE mg/dL   Nitrite NEGATIVE NEGATIVE   Leukocytes, UA MODERATE (A) NEGATIVE   RBC / HPF 0-5 0 - 5 RBC/hpf   WBC, UA 0-5 0 - 5 WBC/hpf   Bacteria, UA NONE SEEN NONE SEEN   Squamous Epithelial / LPF 6-30 (A) NONE SEEN   Mucus PRESENT   Wet prep, genital     Status: Abnormal   Collection Time: 02/09/17  3:23 PM  Result Value Ref Range   Yeast Wet Prep HPF POC NONE SEEN NONE SEEN   Trich, Wet Prep NONE SEEN NONE SEEN   Clue Cells Wet Prep HPF POC NONE SEEN NONE SEEN   WBC, Wet Prep HPF POC FEW (A) NONE SEEN   Sperm NONE SEEN       Imaging:  No results found.  MAU Course/MDM: I have ordered labs and reviewed results.  FHT wnl  No evidence of preterm labor with cervix unchanged in 2 hours in MAU U/A with moderate leukocytes, no bacteria but pain c/w UTI Consult Dr Marvel Plan with presentation, exam findings and test results.  Treatments in MAU included headache cocktail (minus the decadron due to pt allergy to steroids) with Reglan 10 mg IV and Benadryl 25 mg IV plus LR x 1000 ml. H/a mostly resolved after fluids/medications.   Will treat presumptive UTI with Bactrim DS BID x 3 days, Urine culture to determine further management Pt stable at time of discharge.  Today's evaluation included a work-up for preterm labor which can be life-threatening for both mom and baby.  Assessment: 1. UTI (urinary tract  infection) during pregnancy, second trimester   2. Pelvic pain affecting pregnancy in second trimester, antepartum     Plan: Discharge home Labor precautions and fetal kick counts  Follow-up Information    [facility] Follow up.   Why:  As scheduled, or sooner as needed for symptoms. Return to MAU as needed for emergencies. Contact information: [address]  [address] [place] [address] [phone]          Allergies as of 02/09/2017      Reactions   Pulmicort [budesonide] Palpitations   Racing heart   Tomato Hives, Itching   Other Hives   mushrooms  Promethazine Rash   dizziness      Medication List    STOP taking these medications   citalopram 20 MG tablet Commonly known as:  CELEXA   hydrOXYzine 25 MG capsule Commonly known as:  VISTARIL   metoCLOPramide 10 MG tablet Commonly known as:  REGLAN     TAKE these medications   acetaminophen 500 MG tablet Commonly known as:  TYLENOL Take 1,000 mg by mouth every 6 (six) hours as needed.   albuterol 108 (90 Base) MCG/ACT inhaler Commonly known as:  PROVENTIL HFA;VENTOLIN HFA Inhale 2 puffs into the lungs every 6 (six) hours as needed for wheezing or shortness of breath.   multivitamin with minerals Tabs tablet Take 1 tablet by mouth daily.   sulfamethoxazole-trimethoprim 800-160 MG tablet Commonly known as:  BACTRIM DS,SEPTRA DS Take 1 tablet by mouth 2 (two) times daily.       Fatima Blank Certified Nurse-Midwife 02/09/2017 5:58 PM

## 2017-02-09 NOTE — Telephone Encounter (Signed)
Encounter opened in error

## 2017-02-09 NOTE — MAU Note (Signed)
Past 2-3 days pt reports contractions and increasing sharp vag pains and pressure.  Also reports HA ongoing with Hx of migraines.  + vag spotting today, pinkish red not having to wear pad Pt reports having sexual intercourse 2 days ago.

## 2017-02-10 LAB — GC/CHLAMYDIA PROBE AMP (~~LOC~~) NOT AT ARMC
Chlamydia: NEGATIVE
Neisseria Gonorrhea: NEGATIVE

## 2017-02-10 LAB — CULTURE, OB URINE

## 2017-02-11 ENCOUNTER — Inpatient Hospital Stay (HOSPITAL_COMMUNITY)
Admission: AD | Admit: 2017-02-11 | Discharge: 2017-02-11 | Disposition: A | Discharge status: Home / Self Care | Payer: 59 | Source: Ambulatory Visit | Attending: Obstetrics and Gynecology | Admitting: Obstetrics and Gynecology

## 2017-02-11 ENCOUNTER — Encounter (HOSPITAL_COMMUNITY): Payer: Self-pay | Admitting: *Deleted

## 2017-02-11 DIAGNOSIS — G44209 Tension-type headache, unspecified, not intractable: Secondary | ICD-10-CM

## 2017-02-11 DIAGNOSIS — R51 Headache: Secondary | ICD-10-CM | POA: Diagnosis present

## 2017-02-11 DIAGNOSIS — R102 Pelvic and perineal pain: Secondary | ICD-10-CM | POA: Diagnosis present

## 2017-02-11 DIAGNOSIS — Z79899 Other long term (current) drug therapy: Secondary | ICD-10-CM | POA: Diagnosis not present

## 2017-02-11 DIAGNOSIS — O26892 Other specified pregnancy related conditions, second trimester: Secondary | ICD-10-CM | POA: Diagnosis not present

## 2017-02-11 DIAGNOSIS — O26899 Other specified pregnancy related conditions, unspecified trimester: Secondary | ICD-10-CM

## 2017-02-11 DIAGNOSIS — Z3A23 23 weeks gestation of pregnancy: Secondary | ICD-10-CM | POA: Insufficient documentation

## 2017-02-11 LAB — URINALYSIS, ROUTINE W REFLEX MICROSCOPIC
Bilirubin Urine: NEGATIVE
Glucose, UA: NEGATIVE mg/dL
HGB URINE DIPSTICK: NEGATIVE
Ketones, ur: NEGATIVE mg/dL
Nitrite: NEGATIVE
PH: 7 (ref 5.0–8.0)
Protein, ur: NEGATIVE mg/dL
SPECIFIC GRAVITY, URINE: 1.006 (ref 1.005–1.030)

## 2017-02-11 MED ORDER — CYCLOBENZAPRINE HCL 10 MG PO TABS
10.0000 mg | ORAL_TABLET | Freq: Once | ORAL | Status: AC
  Administered 2017-02-11: 10 mg via ORAL
  Filled 2017-02-11: qty 1

## 2017-02-11 MED ORDER — CYCLOBENZAPRINE HCL 10 MG PO TABS: 5.0000 mg | ORAL_TABLET | Freq: Two times a day (BID) | ORAL | 0 refills | Status: DC | PRN

## 2017-02-11 MED ORDER — METOCLOPRAMIDE HCL 10 MG PO TABS
10.0000 mg | ORAL_TABLET | Freq: Once | ORAL | Status: AC
  Administered 2017-02-11: 10 mg via ORAL
  Filled 2017-02-11: qty 1

## 2017-02-11 MED ORDER — DIPHENHYDRAMINE HCL 25 MG PO CAPS
25.0000 mg | ORAL_CAPSULE | Freq: Once | ORAL | Status: AC
  Administered 2017-02-11: 25 mg via ORAL
  Filled 2017-02-11: qty 1

## 2017-02-11 NOTE — Discharge Instructions (Signed)
NECK TENSION: Assisted Stretch    Reach right arm around head and hold slightly above ear. Gently bring right ear toward right shoulder. Hold position for ___ breaths. Repeat with other arm. Repeat ___ times, alternating arms. Do ___ times per day.  Copyright  VHI. All rights reserved.  Round Ligament Pain The round ligament is a cord of muscle and tissue that helps to support the uterus. It can become a source of pain during pregnancy if it becomes stretched or twisted as the baby grows. The pain usually begins in the second trimester of pregnancy, and it can come and go until the baby is delivered. It is not a serious problem, and it does not cause harm to the baby. Round ligament pain is usually a short, sharp, and pinching pain, but it can also be a dull, lingering, and aching pain. The pain is felt in the lower side of the abdomen or in the groin. It usually starts deep in the groin and moves up to the outside of the hip area. Pain can occur with:  A sudden change in position.  Rolling over in bed.  Coughing or sneezing.  Physical activity.  Follow these instructions at home: Watch your condition for any changes. Take these steps to help with your pain:  When the pain starts, relax. Then try: ? Sitting down. ? Flexing your knees up to your abdomen. ? Lying on your side with one pillow under your abdomen and another pillow between your legs. ? Sitting in a warm bath for 15-20 minutes or until the pain goes away.  Take over-the-counter and prescription medicines only as told by your health care provider.  Move slowly when you sit and stand.  Avoid long walks if they cause pain.  Stop or lessen your physical activities if they cause pain.  Contact a health care provider if:  Your pain does not go away with treatment.  You feel pain in your back that you did not have before.  Your medicine is not helping. Get help right away if:  You develop a fever or chills.  You  develop uterine contractions.  You develop vaginal bleeding.  You develop nausea or vomiting.  You develop diarrhea.  You have pain when you urinate. This information is not intended to replace advice given to you by your health care provider. Make sure you discuss any questions you have with your health care provider. Document Released: 01/26/2008 Document Revised: 09/24/2015 Document Reviewed: 06/25/2014 Elsevier Interactive Patient Education  [employer].

## 2017-02-11 NOTE — MAU Provider Note (Signed)
History     CSN: [id number]  Arrival date and time: 02/11/17 1455   First Provider Initiated Contact with Patient 02/11/17 1521      Chief Complaint  Patient presents with  . Pelvic Pain  . Back Pain  . Headache   Wanda Nixon is a 26 y.o. G3P2002 at [redacted]w[redacted]d presenting with presenting for the second time in 2 days with severe bilateral groin, suprapubic and symphysis pubis aching and sharp pain. At times pain radiates to her lower back and posterior UEs. Pain is exacerbated by any walking or jarring movements and resolves with rest. She walks a lot at work and has a 36-year-old and 35-year-old at home. Treated here 2 days ago for presumptive UTI but urine culture was negative. GC, Chlamydia, wet prep also negative. She also has a headache that has been waxing and waning for 4 days. Headache is frontal and she often wakes with it. Sometimes it feels like pressure behind her eyes. She also states for several months she's had soreness in the back of her head. Her headache resolved temporarily when she received Benadryl Reglan and IV fluids here 2 days ago. She has intermittent nausea and decreased appetite but is tolerating liquids and solids while in MAU. Denies dysuria or irritative vaginal discharge. Good fetal movement, no vaginal bleeding, no leakage of fluid.    OB History  Gravida Para Term Preterm AB Living  3 2 2  0   2  SAB TAB Ectopic Multiple Live Births        0 2    # Outcome Date GA Lbr Len/2nd Weight Sex Delivery Anes PTL Lv  3 Current           2 Term 07/26/15 [redacted]w[redacted]d 03:17 / 00:04 5 lb 7.7 oz (2.485 kg) M Vag-Spont EPI  LIV  1 Term 08/31/14 [redacted]w[redacted]d 14:24 / 00:12 5 lb 12.4 oz (2.62 kg) F Vag-Spont EPI  LIV       Past Medical History:  Diagnosis Date  . Asthma    Last used 08/30/14; weekly inhaler use  . Cholestasis of pregnancy in third trimester 07/25/2015  . Eczema   . Headache(784.0)    migraines  . NST (non-stress test) nonreactive   . SVD (spontaneous vaginal  delivery) 07/26/2015    Past Surgical History:  Procedure Laterality Date  . MOUTH SURGERY    . TYMPANOSTOMY TUBE PLACEMENT    . WISDOM TOOTH EXTRACTION      Family History  Problem Relation Age of Onset  . Diabetes Maternal Grandmother   . Hypertension Maternal Grandmother   . Stroke Maternal Grandmother   . High blood pressure Mother   . Hypertension Mother     Social History  Substance Use Topics  . Smoking status: Never Smoker  . Smokeless tobacco: Never Used  . Alcohol use No    Allergies:  Allergies  Allergen Reactions  . Pulmicort [Budesonide] Palpitations  . Tomato Hives and Itching  . Mushroom Extract Complex Hives and Itching  . Promethazine Rash and Other (See Comments)    Reaction:  Dizziness     Prescriptions Prior to Admission  Medication Sig Dispense Refill Last Dose  . albuterol (PROVENTIL HFA;VENTOLIN HFA) 108 (90 Base) MCG/ACT inhaler Inhale 2 puffs into the lungs every 6 (six) hours as needed for wheezing or shortness of breath.   Past Week at Unknown time  . Multiple Vitamin (MULTIVITAMIN WITH MINERALS) TABS tablet Take 1 tablet by mouth daily.   02/08/2017 at  Unknown time  . sulfamethoxazole-trimethoprim (BACTRIM DS,SEPTRA DS) 800-160 MG tablet Take 1 tablet by mouth 2 (two) times daily. 6 tablet 0     Review of Systems  HENT: Negative for congestion and sinus pain.    Physical Exam   Blood pressure 119/79, pulse 100, temperature 98.8 F (37.1 C), temperature source Oral, resp. rate 16, last menstrual period 09/02/2016, SpO2 100 %, not currently breastfeeding.  Physical Exam  MAU Course  Procedures Results for orders placed or performed during the hospital encounter of 02/11/17 (from the past 24 hour(s))  Urinalysis, Routine w reflex microscopic     Status: Abnormal   Collection Time: 02/11/17  2:59 PM  Result Value Ref Range   Color, Urine STRAW (A) YELLOW   APPearance CLEAR CLEAR   Specific Gravity, Urine 1.006 1.005 - 1.030   pH 7.0  5.0 - 8.0   Glucose, UA NEGATIVE NEGATIVE mg/dL   Hgb urine dipstick NEGATIVE NEGATIVE   Bilirubin Urine NEGATIVE NEGATIVE   Ketones, ur NEGATIVE NEGATIVE mg/dL   Protein, ur NEGATIVE NEGATIVE mg/dL   Nitrite NEGATIVE NEGATIVE   Leukocytes, UA TRACE (A) NEGATIVE   RBC / HPF 0-5 0 - 5 RBC/hpf   WBC, UA 0-5 0 - 5 WBC/hpf   Bacteria, UA RARE (A) NONE SEEN   Squamous Epithelial / LPF 0-5 (A) NONE SEEN   Mucus PRESENT    Fetal monitoring Baseline fetal heart rate 145-150, moderate variability, 10 bpm accelerations present, no decelerations Toco: No contractions  Flexeril 10mg  po, Benadryl 25mg  po, Reglan 10mg  po given> Abdominal and groin pain resolved, H/A slightly improved. Feels sleepy. Declines further H/A treatment and will take acetaminophen 1000mg  po q6h prn at home if H/A persists after sleep  Consulted Dr. Meisinger> agrees with plan Reviewed relief measures for RLP and tension headache  Assessment and Plan  26 yo G3P2002 at [redacted]w[redacted]d Fetal well-being by EFM 1. Acute non intractable tension-type headache   2. Pain of round ligament affecting pregnancy, antepartum    Allergies as of 02/11/2017      Reactions   Pulmicort [budesonide] Palpitations   Tomato Hives, Itching   Mushroom Extract Complex Hives, Itching   Promethazine Rash, Other (See Comments)   Reaction:  Dizziness       Medication List    STOP taking these medications   sulfamethoxazole-trimethoprim 800-160 MG tablet Commonly known as:  BACTRIM DS,SEPTRA DS     TAKE these medications   acetaminophen 325 MG tablet Commonly known as:  TYLENOL Take 325-650 mg by mouth every 6 (six) hours as needed for mild pain, moderate pain, fever or headache.   albuterol 108 (90 Base) MCG/ACT inhaler Commonly known as:  PROVENTIL HFA;VENTOLIN HFA Inhale 2 puffs into the lungs every 6 (six) hours as needed for wheezing or shortness of breath.   citalopram 20 MG tablet Commonly known as:  CELEXA Take 20 mg by mouth  daily.   cyclobenzaprine 10 MG tablet Commonly known as:  FLEXERIL Take 0.5 tablets (5 mg total) by mouth 2 (two) times daily as needed for muscle spasms.   PRENATAL GUMMIES/DHA & FA 0.4-32.5 MG Chew Chew 2 each by mouth daily.       Follow-up Information    Bovard-Stuckert, Jeral Fruit, MD Follow up on 02/13/2017.   Specialty:  Obstetrics and Gynecology Contact information: [place] [address] [phone]          [place] 02/11/2017, 3:26 PM

## 2017-02-11 NOTE — MAU Note (Signed)
Pt reports she was seen last week and treated for a UTI, was having pelvic pain, back pain and a headache. Returns today because the pelvic pain is worsening.

## 2017-03-03 ENCOUNTER — Inpatient Hospital Stay (HOSPITAL_COMMUNITY)
Admission: AD | Admit: 2017-03-03 | Discharge: 2017-03-04 | Disposition: A | Discharge status: Home / Self Care | Payer: Medicaid Other | Source: Ambulatory Visit | Attending: Obstetrics and Gynecology | Admitting: Obstetrics and Gynecology

## 2017-03-03 ENCOUNTER — Encounter (HOSPITAL_COMMUNITY): Payer: Self-pay | Admitting: Emergency Medicine

## 2017-03-03 DIAGNOSIS — B373 Candidiasis of vulva and vagina: Secondary | ICD-10-CM

## 2017-03-03 DIAGNOSIS — R109 Unspecified abdominal pain: Secondary | ICD-10-CM | POA: Diagnosis not present

## 2017-03-03 DIAGNOSIS — O26892 Other specified pregnancy related conditions, second trimester: Secondary | ICD-10-CM | POA: Diagnosis present

## 2017-03-03 DIAGNOSIS — B3731 Acute candidiasis of vulva and vagina: Secondary | ICD-10-CM

## 2017-03-03 DIAGNOSIS — O98812 Other maternal infectious and parasitic diseases complicating pregnancy, second trimester: Secondary | ICD-10-CM | POA: Insufficient documentation

## 2017-03-03 DIAGNOSIS — O26899 Other specified pregnancy related conditions, unspecified trimester: Secondary | ICD-10-CM

## 2017-03-03 DIAGNOSIS — G43909 Migraine, unspecified, not intractable, without status migrainosus: Secondary | ICD-10-CM | POA: Diagnosis not present

## 2017-03-03 DIAGNOSIS — R51 Headache: Secondary | ICD-10-CM | POA: Diagnosis not present

## 2017-03-03 DIAGNOSIS — R102 Pelvic and perineal pain: Secondary | ICD-10-CM | POA: Diagnosis present

## 2017-03-03 DIAGNOSIS — Z3A26 26 weeks gestation of pregnancy: Secondary | ICD-10-CM | POA: Diagnosis not present

## 2017-03-03 LAB — URINALYSIS, ROUTINE W REFLEX MICROSCOPIC
BILIRUBIN URINE: NEGATIVE
GLUCOSE, UA: NEGATIVE mg/dL
HGB URINE DIPSTICK: NEGATIVE
Ketones, ur: NEGATIVE mg/dL
NITRITE: NEGATIVE
PROTEIN: 100 mg/dL — AB
SPECIFIC GRAVITY, URINE: 1.029 (ref 1.005–1.030)
pH: 6 (ref 5.0–8.0)

## 2017-03-03 NOTE — MAU Note (Addendum)
Ctxs on and off for few days. Migraine for 3 days. A lot of vaginal pain. Having pain lower back and pelvic pressure. Denies LOF. Some vag d/c white. States had pink/red spotting on Thurs but states has happened entire pregnancy.Marland Kitchen

## 2017-03-04 DIAGNOSIS — O26892 Other specified pregnancy related conditions, second trimester: Secondary | ICD-10-CM

## 2017-03-04 DIAGNOSIS — R109 Unspecified abdominal pain: Secondary | ICD-10-CM | POA: Diagnosis not present

## 2017-03-04 DIAGNOSIS — R51 Headache: Secondary | ICD-10-CM

## 2017-03-04 LAB — WET PREP, GENITAL
Clue Cells Wet Prep HPF POC: NONE SEEN
Sperm: NONE SEEN
Trich, Wet Prep: NONE SEEN

## 2017-03-04 MED ORDER — LACTATED RINGERS IV BOLUS (SEPSIS)
1000.0000 mL | Freq: Once | INTRAVENOUS | Status: AC
  Administered 2017-03-04: 1000 mL via INTRAVENOUS

## 2017-03-04 MED ORDER — DEXAMETHASONE SODIUM PHOSPHATE 10 MG/ML IJ SOLN
10.0000 mg | Freq: Once | INTRAMUSCULAR | Status: DC
  Filled 2017-03-04: qty 1

## 2017-03-04 MED ORDER — METOCLOPRAMIDE HCL 5 MG/ML IJ SOLN
10.0000 mg | Freq: Once | INTRAMUSCULAR | Status: AC
  Administered 2017-03-04: 10 mg via INTRAVENOUS
  Filled 2017-03-04: qty 2

## 2017-03-04 MED ORDER — DIPHENHYDRAMINE HCL 50 MG/ML IJ SOLN
25.0000 mg | Freq: Once | INTRAMUSCULAR | Status: AC
  Administered 2017-03-04: 25 mg via INTRAVENOUS
  Filled 2017-03-04: qty 1

## 2017-03-04 MED ORDER — TERCONAZOLE 0.4 % VA CREA: 1.0000 | TOPICAL_CREAM | Freq: Every day | VAGINAL | 0 refills | Status: DC

## 2017-03-04 NOTE — Discharge Instructions (Signed)

## 2017-03-04 NOTE — Progress Notes (Signed)
Ok to d/c efm per Marcille Buffy CNM

## 2017-03-04 NOTE — MAU Provider Note (Signed)
History     CSN: [id number]  Arrival date and time: 03/03/17 2143   First Provider Initiated Contact with Patient 03/04/17 0006      Chief Complaint  Patient presents with  . Migraine  . Pelvic Pain   Migraine   This is a new problem. Episode onset: 2 days. The problem occurs constantly. The pain is located in the frontal region. The pain does not radiate. The pain quality is similar to prior headaches. The quality of the pain is described as aching. The pain is at a severity of 10/10. Associated symptoms include nausea and photophobia. Pertinent negatives include no fever or vomiting. The symptoms are aggravated by bright light and noise. She has tried acetaminophen for the symptoms. The treatment provided no relief.  Pelvic Pain  The patient's primary symptoms include pelvic pain. The patient's pertinent negatives include no vaginal discharge. This is a new problem. Episode onset: 2 days ago. The problem occurs constantly. The problem has been unchanged. Pain severity now: 9/10. The problem affects both sides. She is pregnant. Associated symptoms include nausea. Pertinent negatives include no chills, dysuria, fever, frequency, urgency or vomiting. The vaginal discharge was normal ("I feel like I may be getting a yeast infection"). Vaginal bleeding amount: one spot when wiping yesterday.  Nothing aggravates the symptoms. She has tried nothing for the symptoms.    Past Medical History:  Diagnosis Date  . Asthma    Last used 08/30/14; weekly inhaler use  . Cholestasis of pregnancy in third trimester 07/25/2015  . Eczema   . Headache(784.0)    migraines  . NST (non-stress test) nonreactive   . SVD (spontaneous vaginal delivery) 07/26/2015    Past Surgical History:  Procedure Laterality Date  . MOUTH SURGERY    . TYMPANOSTOMY TUBE PLACEMENT    . WISDOM TOOTH EXTRACTION      Family History  Problem Relation Age of Onset  . Diabetes Maternal Grandmother   . Hypertension Maternal  Grandmother   . Stroke Maternal Grandmother   . High blood pressure Mother   . Hypertension Mother     Social History  Substance Use Topics  . Smoking status: Never Smoker  . Smokeless tobacco: Never Used  . Alcohol use No    Allergies:  Allergies  Allergen Reactions  . Pulmicort [Budesonide] Palpitations  . Tomato Hives and Itching  . Mushroom Extract Complex Hives and Itching  . Promethazine Rash and Other (See Comments)    Reaction:  Dizziness     Prescriptions Prior to Admission  Medication Sig Dispense Refill Last Dose  . acetaminophen (TYLENOL) 325 MG tablet Take 325-650 mg by mouth every 6 (six) hours as needed for mild pain, moderate pain, fever or headache.   Past Week at Unknown time  . albuterol (PROVENTIL HFA;VENTOLIN HFA) 108 (90 Base) MCG/ACT inhaler Inhale 2 puffs into the lungs every 6 (six) hours as needed for wheezing or shortness of breath.   Past Week at Unknown time  . citalopram (CELEXA) 20 MG tablet Take 20 mg by mouth daily.   02/10/2017 at Unknown time  . cyclobenzaprine (FLEXERIL) 10 MG tablet Take 0.5 tablets (5 mg total) by mouth 2 (two) times daily as needed for muscle spasms. 20 tablet 0   . Prenatal MV-Min-FA-Omega-3 (PRENATAL GUMMIES/DHA & FA) 0.4-32.5 MG CHEW Chew 2 each by mouth daily.   02/10/2017 at Unknown time    Review of Systems  Constitutional: Negative for chills and fever.  Eyes: Positive for photophobia.  Gastrointestinal: Positive for nausea. Negative for vomiting.  Genitourinary: Positive for pelvic pain. Negative for dysuria, frequency, urgency, vaginal bleeding and vaginal discharge.   Physical Exam   Blood pressure 97/78, pulse 95, temperature 98.3 F (36.8 C), resp. rate 18, height 4\' 11"  (1.499 m), weight 139 lb (63 kg), last menstrual period 09/02/2016, not currently breastfeeding.  Physical Exam  Nursing note and vitals reviewed. Constitutional: She is oriented to person, place, and time. She appears well-nourished. No  distress.  HENT:  Head: Normocephalic.  Cardiovascular: Normal rate.   Respiratory: Effort normal.  GI: Soft. There is no tenderness. There is no rebound.  Genitourinary:  Genitourinary Comments: Cervix: closed/thick/ballotable   Neurological: She is alert and oriented to person, place, and time.  Skin: Skin is warm and dry.  Psychiatric: She has a normal mood and affect.    FHT: 130, moderate with 10x10 accels, no decels Toco: no UCs   Results for orders placed or performed during the hospital encounter of 03/03/17 (from the past 24 hour(s))  Urinalysis, Routine w reflex microscopic     Status: Abnormal   Collection Time: 03/03/17 10:00 PM  Result Value Ref Range   Color, Urine YELLOW YELLOW   APPearance HAZY (A) CLEAR   Specific Gravity, Urine 1.029 1.005 - 1.030   pH 6.0 5.0 - 8.0   Glucose, UA NEGATIVE NEGATIVE mg/dL   Hgb urine dipstick NEGATIVE NEGATIVE   Bilirubin Urine NEGATIVE NEGATIVE   Ketones, ur NEGATIVE NEGATIVE mg/dL   Protein, ur 100 (A) NEGATIVE mg/dL   Nitrite NEGATIVE NEGATIVE   Leukocytes, UA LARGE (A) NEGATIVE   RBC / HPF 0-5 0 - 5 RBC/hpf   WBC, UA 6-30 0 - 5 WBC/hpf   Bacteria, UA RARE (A) NONE SEEN   Squamous Epithelial / LPF 6-30 (A) NONE SEEN   Mucus PRESENT   Wet prep, genital     Status: Abnormal   Collection Time: 03/04/17  1:09 AM  Result Value Ref Range   Yeast Wet Prep HPF POC PRESENT (A) NONE SEEN   Trich, Wet Prep NONE SEEN NONE SEEN   Clue Cells Wet Prep HPF POC NONE SEEN NONE SEEN   WBC, Wet Prep HPF POC MODERATE (A) NONE SEEN   Sperm NONE SEEN    MAU Course  Procedures  MDM 0130: Patient has had benadryl, reglan and IV fluids. She is refusing the decadron. She reports that her headache has improved at this time.   Assessment and Plan   1. Headache in pregnancy, antepartum   2. Yeast infection involving the vagina and surrounding area   3. [redacted] weeks gestation of pregnancy    DC home Comfort measures reviewed  3rd Trimester  precautions  PTL precautions  Fetal kick counts RX: terazol 7 as directed #1 with 0RF  Return to MAU as needed FU with OB as planned  Follow-up Information    Paula Compton, MD Follow up.   Specialty:  Obstetrics and Gynecology Contact information: [place] [address] [phone]            Marcille Buffy 03/04/2017, 1:56 AM

## 2017-03-05 LAB — URINE CULTURE

## 2017-03-06 LAB — GC/CHLAMYDIA PROBE AMP (~~LOC~~) NOT AT ARMC
CHLAMYDIA, DNA PROBE: NEGATIVE
NEISSERIA GONORRHEA: NEGATIVE

## 2017-03-20 ENCOUNTER — Other Ambulatory Visit (HOSPITAL_COMMUNITY): Payer: Self-pay | Admitting: Obstetrics and Gynecology

## 2017-03-20 DIAGNOSIS — O26613 Liver and biliary tract disorders in pregnancy, third trimester: Principal | ICD-10-CM

## 2017-03-20 DIAGNOSIS — K831 Obstruction of bile duct: Secondary | ICD-10-CM

## 2017-03-27 ENCOUNTER — Inpatient Hospital Stay (HOSPITAL_COMMUNITY)
Admission: AD | Admit: 2017-03-27 | Discharge: 2017-03-27 | Discharge status: Against Medical Advice | Payer: Medicaid Other | Source: Ambulatory Visit | Attending: Obstetrics and Gynecology | Admitting: Obstetrics and Gynecology

## 2017-03-27 ENCOUNTER — Other Ambulatory Visit: Payer: Self-pay

## 2017-03-27 DIAGNOSIS — O26893 Other specified pregnancy related conditions, third trimester: Secondary | ICD-10-CM | POA: Insufficient documentation

## 2017-03-27 DIAGNOSIS — Z3A30 30 weeks gestation of pregnancy: Secondary | ICD-10-CM | POA: Diagnosis not present

## 2017-03-27 DIAGNOSIS — R102 Pelvic and perineal pain: Secondary | ICD-10-CM | POA: Diagnosis not present

## 2017-03-27 DIAGNOSIS — R300 Dysuria: Secondary | ICD-10-CM | POA: Insufficient documentation

## 2017-03-27 LAB — URINALYSIS, ROUTINE W REFLEX MICROSCOPIC
Bilirubin Urine: NEGATIVE
GLUCOSE, UA: NEGATIVE mg/dL
HGB URINE DIPSTICK: NEGATIVE
Ketones, ur: NEGATIVE mg/dL
Nitrite: NEGATIVE
PH: 7 (ref 5.0–8.0)
Protein, ur: NEGATIVE mg/dL
Specific Gravity, Urine: 1.01 (ref 1.005–1.030)

## 2017-03-27 NOTE — MAU Note (Signed)
Pt told front desk, she was going to the office. Just had 3 d/cs- was getting ready to call pt back.  Adm clerk- called pt, she was still in parking lot, then called back, Dr Willis Modena wants to see her in the office.  So pt is going there.

## 2017-03-27 NOTE — MAU Note (Addendum)
Having some spotting and lots of pelvic pressure.  Feels really sick, like something is off and can't pin point it. Has choleystasis, having a flair up, the itching is really bad.  Hurts when she pees, pressure when she pees.

## 2017-03-28 ENCOUNTER — Ambulatory Visit (HOSPITAL_COMMUNITY)
Admission: RE | Admit: 2017-03-28 | Discharge: 2017-03-28 | Disposition: A | Discharge status: Home / Self Care | Payer: 59 | Source: Ambulatory Visit | Attending: Obstetrics and Gynecology | Admitting: Obstetrics and Gynecology

## 2017-03-28 ENCOUNTER — Encounter (HOSPITAL_COMMUNITY): Payer: Self-pay

## 2017-04-01 ENCOUNTER — Inpatient Hospital Stay (HOSPITAL_COMMUNITY)
Admission: AD | Admit: 2017-04-01 | Discharge: 2017-04-01 | Disposition: A | Discharge status: Home / Self Care | Payer: Medicaid Other | Source: Ambulatory Visit | Attending: Obstetrics and Gynecology | Admitting: Obstetrics and Gynecology

## 2017-04-01 ENCOUNTER — Encounter (HOSPITAL_COMMUNITY): Payer: Self-pay | Admitting: *Deleted

## 2017-04-01 DIAGNOSIS — O98513 Other viral diseases complicating pregnancy, third trimester: Secondary | ICD-10-CM | POA: Insufficient documentation

## 2017-04-01 DIAGNOSIS — Z9889 Other specified postprocedural states: Secondary | ICD-10-CM | POA: Diagnosis not present

## 2017-04-01 DIAGNOSIS — K0889 Other specified disorders of teeth and supporting structures: Secondary | ICD-10-CM

## 2017-04-01 DIAGNOSIS — Z833 Family history of diabetes mellitus: Secondary | ICD-10-CM | POA: Diagnosis not present

## 2017-04-01 DIAGNOSIS — O26893 Other specified pregnancy related conditions, third trimester: Secondary | ICD-10-CM | POA: Diagnosis not present

## 2017-04-01 DIAGNOSIS — Z3A30 30 weeks gestation of pregnancy: Secondary | ICD-10-CM | POA: Diagnosis not present

## 2017-04-01 DIAGNOSIS — J45909 Unspecified asthma, uncomplicated: Secondary | ICD-10-CM | POA: Insufficient documentation

## 2017-04-01 DIAGNOSIS — R197 Diarrhea, unspecified: Secondary | ICD-10-CM | POA: Diagnosis not present

## 2017-04-01 DIAGNOSIS — O99513 Diseases of the respiratory system complicating pregnancy, third trimester: Secondary | ICD-10-CM | POA: Diagnosis not present

## 2017-04-01 DIAGNOSIS — Z8249 Family history of ischemic heart disease and other diseases of the circulatory system: Secondary | ICD-10-CM | POA: Diagnosis not present

## 2017-04-01 DIAGNOSIS — Z823 Family history of stroke: Secondary | ICD-10-CM | POA: Diagnosis not present

## 2017-04-01 DIAGNOSIS — R112 Nausea with vomiting, unspecified: Secondary | ICD-10-CM | POA: Insufficient documentation

## 2017-04-01 DIAGNOSIS — R109 Unspecified abdominal pain: Secondary | ICD-10-CM | POA: Diagnosis not present

## 2017-04-01 DIAGNOSIS — A084 Viral intestinal infection, unspecified: Secondary | ICD-10-CM | POA: Diagnosis not present

## 2017-04-01 LAB — URINALYSIS, ROUTINE W REFLEX MICROSCOPIC
Bilirubin Urine: NEGATIVE
Glucose, UA: NEGATIVE mg/dL
Hgb urine dipstick: NEGATIVE
KETONES UR: NEGATIVE mg/dL
Nitrite: NEGATIVE
PH: 7 (ref 5.0–8.0)
Protein, ur: NEGATIVE mg/dL
Specific Gravity, Urine: 1.014 (ref 1.005–1.030)

## 2017-04-01 MED ORDER — ONDANSETRON 8 MG PO TBDP
8.0000 mg | ORAL_TABLET | Freq: Once | ORAL | Status: AC
  Administered 2017-04-01: 8 mg via ORAL
  Filled 2017-04-01: qty 1

## 2017-04-01 MED ORDER — ONDANSETRON 8 MG PO TBDP: 8.0000 mg | ORAL_TABLET | Freq: Three times a day (TID) | ORAL | 0 refills | Status: DC | PRN

## 2017-04-01 MED ORDER — OXYCODONE-ACETAMINOPHEN 5-325 MG PO TABS
1.0000 | ORAL_TABLET | Freq: Once | ORAL | Status: DC
  Filled 2017-04-01: qty 1

## 2017-04-01 MED ORDER — OXYCODONE-ACETAMINOPHEN 5-325 MG PO TABS: 1.0000 | ORAL_TABLET | Freq: Once | ORAL | Status: DC

## 2017-04-01 NOTE — MAU Provider Note (Signed)
NST reviewed and Category 1 Pt better with zofran for n/v.  Cervix long and closed.

## 2017-04-01 NOTE — MAU Note (Signed)
RN went into room to readjust EFM, informed pt that percocet is ready and RN will bring it, pt refused medication due to "narcotics make me feel funny". Pt requests injection of ibuprofen. RN charts in [facility] pt refusal. RN states she will talk to CNM. CNM informs pt that she is not able to prescribe ibuprofen due to pt in 3rd trimester and pt states she will take percocet.

## 2017-04-01 NOTE — Discharge Instructions (Signed)

## 2017-04-01 NOTE — MAU Provider Note (Signed)
History   G3P2012 @ 30.1 wks in with nausea, vomiting and soft stools  since yesterday.  Also c/o severe toothache is on antibiotics and supposed to have extraction next week.  Pt has cholestasis of pregnancy and is on Actigall.    CSN: [id number]  Arrival date & time 04/01/17  1406   None     Chief Complaint  Patient presents with  . Abdominal Pain  . Emesis    HPI  Past Medical History:  Diagnosis Date  . Asthma    Last used 08/30/14; weekly inhaler use  . Cholestasis of pregnancy in third trimester 07/25/2015  . Eczema   . Headache(784.0)    migraines  . NST (non-stress test) nonreactive   . SVD (spontaneous vaginal delivery) 07/26/2015    Past Surgical History:  Procedure Laterality Date  . MOUTH SURGERY    . TYMPANOSTOMY TUBE PLACEMENT    . WISDOM TOOTH EXTRACTION      Family History  Problem Relation Age of Onset  . Diabetes Maternal Grandmother   . Hypertension Maternal Grandmother   . Stroke Maternal Grandmother   . High blood pressure Mother   . Hypertension Mother     Social History   Tobacco Use  . Smoking status: Never Smoker  . Smokeless tobacco: Never Used  Substance Use Topics  . Alcohol use: No  . Drug use: No    OB History    Gravida Para Term Preterm AB Living   3 2 2  0   2   SAB TAB Ectopic Multiple Live Births         0 2      Review of Systems  Constitutional: Negative.   HENT: Negative.   Eyes: Negative.   Respiratory: Negative.   Cardiovascular: Negative.   Gastrointestinal: Positive for abdominal pain, diarrhea, nausea and vomiting.  Endocrine: Negative.   Genitourinary: Negative.   Musculoskeletal: Negative.   Skin: Negative.   Allergic/Immunologic: Negative.   Neurological: Negative.   Hematological: Negative.   Psychiatric/Behavioral: Negative.     Allergies  Pulmicort [budesonide]; Tomato; Mushroom extract complex; and Promethazine  Home Medications    BP 115/77 (BP Location: Left Arm)   Pulse (!) 104    LMP 09/02/2016 (Exact Date)   SpO2 100%   Physical Exam  Constitutional: She is oriented to person, place, and time. She appears well-developed and well-nourished.  HENT:  Head: Normocephalic.  Eyes: Pupils are equal, round, and reactive to light.  Neck: Normal range of motion.  Cardiovascular: Normal rate, regular rhythm, normal heart sounds and intact distal pulses.  Pulmonary/Chest: Effort normal and breath sounds normal.  Abdominal: Soft.  Genitourinary: Vagina normal and uterus normal.  Musculoskeletal: Normal range of motion.  Neurological: She is alert and oriented to person, place, and time. She has normal reflexes.  Skin: Skin is warm and dry.  Psychiatric: She has a normal mood and affect. Her behavior is normal. Judgment and thought content normal.    MAU Course  Procedures (including critical care time)  Labs Reviewed  URINALYSIS, ROUTINE W REFLEX MICROSCOPIC   No results found.   1. Abdominal pain in pregnancy, third trimester   2. Viral gastroenteritis   3. Nausea and vomiting in adult   4. Diarrhea, unspecified type       MDM  VSS, FHR 130's with accels noted no decels. SVE cl/th/post/high.  Percocet po for toothache. Keeping down fluids. POC discussed with Dr. Marvel Plan. Will d/c home on Zofran.

## 2017-04-01 NOTE — MAU Note (Signed)
Patient has been c/o feeling dizzy and unable to keep food down for past few days.  Was seen by Dr Willis Modena in the office last week for dizziness.  Having soft stools, but no diarrhea.  Emesis x1 today after attempting to eat breakfast.  Still feeling dizzy.

## 2017-04-01 NOTE — Progress Notes (Signed)
Brought percocet dose to patient, but she again refused, stating it makes her feel "groggy" and that she'll deal with the pain and call her dentist again.  "I'm okay, I'll just try tylenol at home."  Medication returned to [facility].

## 2017-04-13 ENCOUNTER — Encounter (HOSPITAL_COMMUNITY): Payer: Self-pay | Admitting: *Deleted

## 2017-04-13 ENCOUNTER — Inpatient Hospital Stay (HOSPITAL_COMMUNITY)
Admission: AD | Admit: 2017-04-13 | Discharge: 2017-04-14 | Disposition: A | Discharge status: Home / Self Care | Payer: Medicaid Other | Source: Ambulatory Visit | Attending: Obstetrics and Gynecology | Admitting: Obstetrics and Gynecology

## 2017-04-13 DIAGNOSIS — Z79899 Other long term (current) drug therapy: Secondary | ICD-10-CM | POA: Insufficient documentation

## 2017-04-13 DIAGNOSIS — M549 Dorsalgia, unspecified: Secondary | ICD-10-CM | POA: Diagnosis present

## 2017-04-13 DIAGNOSIS — R109 Unspecified abdominal pain: Secondary | ICD-10-CM | POA: Diagnosis not present

## 2017-04-13 DIAGNOSIS — K831 Obstruction of bile duct: Secondary | ICD-10-CM | POA: Diagnosis not present

## 2017-04-13 DIAGNOSIS — O479 False labor, unspecified: Secondary | ICD-10-CM | POA: Diagnosis not present

## 2017-04-13 DIAGNOSIS — Z3A32 32 weeks gestation of pregnancy: Secondary | ICD-10-CM | POA: Diagnosis not present

## 2017-04-13 DIAGNOSIS — J45909 Unspecified asthma, uncomplicated: Secondary | ICD-10-CM | POA: Diagnosis not present

## 2017-04-13 DIAGNOSIS — Z3A31 31 weeks gestation of pregnancy: Secondary | ICD-10-CM | POA: Diagnosis not present

## 2017-04-13 DIAGNOSIS — N898 Other specified noninflammatory disorders of vagina: Secondary | ICD-10-CM | POA: Diagnosis present

## 2017-04-13 DIAGNOSIS — O26613 Liver and biliary tract disorders in pregnancy, third trimester: Secondary | ICD-10-CM | POA: Insufficient documentation

## 2017-04-13 DIAGNOSIS — O99513 Diseases of the respiratory system complicating pregnancy, third trimester: Secondary | ICD-10-CM | POA: Diagnosis not present

## 2017-04-13 DIAGNOSIS — O26893 Other specified pregnancy related conditions, third trimester: Secondary | ICD-10-CM | POA: Diagnosis present

## 2017-04-13 LAB — URINALYSIS, ROUTINE W REFLEX MICROSCOPIC
BILIRUBIN URINE: NEGATIVE
GLUCOSE, UA: NEGATIVE mg/dL
HGB URINE DIPSTICK: NEGATIVE
KETONES UR: NEGATIVE mg/dL
NITRITE: NEGATIVE
PH: 6 (ref 5.0–8.0)
Protein, ur: 100 mg/dL — AB
Specific Gravity, Urine: 1.029 (ref 1.005–1.030)

## 2017-04-13 NOTE — MAU Note (Signed)
PT SAYS AT 9PM-  AFTER  VOIDING  - THEN SHE HAD A GUSH OF  FLUID  COME OUT.   NO FLUID  HAS PASSED SINCE  THEN .   ALSO FEELS  CRAMPS - ALL DAY  NOTHING  CONSISTENT  - OFF/ ON   LAST SEX-  1 WEEK AGO    [facility]  WITH  DR Melba Coon

## 2017-04-13 NOTE — MAU Provider Note (Signed)
Chief Complaint:  Back Pain; Abdominal Pain; and Rupture of Membranes   None     HPI: Wanda Nixon is a 26 y.o. G3P2002 at [redacted]w[redacted]d who presents to MAU reporting concern for ROM. Patient states that after using the bathroom she felt a gush of fluid. This was around 9pm. Denies any continued leakage of fluid. Also concerned about some abdominal and back pain that she has had for the last few days. Abdominal pain feels like cramps. When pain occurs it makes it hard for her to breath. Denies current shortness of breath. Denies any recent intercourse.   Denies vaginal discharge, or vaginal bleeding. No fevers, n/v, vision changes, swelling. Good fetal movement.   Pregnancy Course:   Past Medical History: Past Medical History:  Diagnosis Date  . Asthma    Last used 08/30/14; weekly inhaler use  . Cholestasis of pregnancy in third trimester 07/25/2015  . Eczema   . Headache(784.0)    migraines  . NST (non-stress test) nonreactive   . SVD (spontaneous vaginal delivery) 07/26/2015    Past obstetric history: OB History  Gravida Para Term Preterm AB Living  3 2 2  0   2  SAB TAB Ectopic Multiple Live Births        0 2    # Outcome Date GA Lbr Len/2nd Weight Sex Delivery Anes PTL Lv  3 Current           2 Term 07/26/15 [redacted]w[redacted]d 03:17 / 00:04 2.485 kg (5 lb 7.7 oz) M Vag-Spont EPI  LIV  1 Term 08/31/14 [redacted]w[redacted]d 14:24 / 00:12 2.62 kg (5 lb 12.4 oz) F Vag-Spont EPI  LIV      Past Surgical History: Past Surgical History:  Procedure Laterality Date  . MOUTH SURGERY    . TYMPANOSTOMY TUBE PLACEMENT    . WISDOM TOOTH EXTRACTION       Family History: Family History  Problem Relation Age of Onset  . Diabetes Maternal Grandmother   . Hypertension Maternal Grandmother   . Stroke Maternal Grandmother   . High blood pressure Mother   . Hypertension Mother     Social History: Social History   Tobacco Use  . Smoking status: Never Smoker  . Smokeless tobacco: Never Used  Substance Use  Topics  . Alcohol use: No  . Drug use: No    Allergies:  Allergies  Allergen Reactions  . Pulmicort [Budesonide] Palpitations  . Tomato Hives and Itching  . Mushroom Extract Complex Hives and Itching  . Promethazine Rash and Other (See Comments)    Reaction:  Dizziness     Meds:  Medications Prior to Admission  Medication Sig Dispense Refill Last Dose  . albuterol (PROVENTIL HFA;VENTOLIN HFA) 108 (90 Base) MCG/ACT inhaler Inhale 2 puffs into the lungs every 6 (six) hours as needed for wheezing or shortness of breath.   Past Week at Unknown time  . amoxicillin (AMOXIL) 250 MG capsule Take 250 mg by mouth 4 (four) times daily.   04/13/2017 at Unknown time  . citalopram (CELEXA) 20 MG tablet Take 20 mg by mouth daily.   Past Month at Unknown time  . cyclobenzaprine (FLEXERIL) 10 MG tablet Take 0.5 tablets (5 mg total) by mouth 2 (two) times daily as needed for muscle spasms. 20 tablet 0 Past Week at Unknown time  . ondansetron (ZOFRAN ODT) 8 MG disintegrating tablet Take 1 tablet (8 mg total) by mouth every 8 (eight) hours as needed for nausea or vomiting. 20 tablet 0  04/12/2017 at Unknown time  . ursodiol (ACTIGALL) 300 MG capsule Take 300 mg by mouth daily.   04/12/2017 at Unknown time  . acetaminophen (TYLENOL) 325 MG tablet Take 325-650 mg by mouth every 6 (six) hours as needed for mild pain, moderate pain, fever or headache.   03/31/2017 at Unknown time  . oxyCODONE-acetaminophen (PERCOCET/ROXICET) 5-325 MG tablet Take 0.5 tablets by mouth every 4 (four) hours as needed for moderate pain or severe pain.   NEVERRX  . Prenatal MV-Min-FA-Omega-3 (PRENATAL GUMMIES/DHA & FA) 0.4-32.5 MG CHEW Chew 2 each by mouth daily.   03/31/2017 at Unknown time  . terconazole (TERAZOL 7) 0.4 % vaginal cream Place 1 applicator vaginally at bedtime. 45 g 0 More than a month at Unknown time    I have reviewed patient's Past Medical Hx, Surgical Hx, Family Hx, Social Hx, medications and allergies.    ROS:  All systems reviewed and are negative for acute change except as noted in the HPI.   Physical Exam   Patient Vitals for the past 24 hrs:  BP Temp Temp src Pulse Resp SpO2  04/13/17 2235 (!) 112/59 - - - - -  04/13/17 2234 - 98.2 F (36.8 C) Oral 93 16 -  04/13/17 2233 - - - - - 100 %   Constitutional: Well-developed, well-nourished female in no acute distress.  Cardiovascular: normal rate and rhythm, pulses intact Respiratory: normal rate and effort.  GI: Abd soft, non-tender, gravid appropriate for gestational age.  MS: Extremities nontender, no edema, normal ROM Neurologic: Alert and oriented x 4.  GU: Neg CVAT. Pelvic: NEFG, perineum dry, physiologic discharge, no blood, cervix clean. No CMT Psych: normal mood and affect  Dilation: Closed Effacement (%): Thick Station: -3 Exam by:: Darcel Bayley, MD    Labs: Results for orders placed or performed during the hospital encounter of 04/13/17  Wet prep, genital  Result Value Ref Range   Yeast Wet Prep HPF POC NONE SEEN NONE SEEN   Trich, Wet Prep NONE SEEN NONE SEEN   Clue Cells Wet Prep HPF POC NONE SEEN NONE SEEN   WBC, Wet Prep HPF POC MANY (A) NONE SEEN   Sperm NONE SEEN   Urinalysis, Routine w reflex microscopic  Result Value Ref Range   Color, Urine YELLOW YELLOW   APPearance CLOUDY (A) CLEAR   Specific Gravity, Urine 1.029 1.005 - 1.030   pH 6.0 5.0 - 8.0   Glucose, UA NEGATIVE NEGATIVE mg/dL   Hgb urine dipstick NEGATIVE NEGATIVE   Bilirubin Urine NEGATIVE NEGATIVE   Ketones, ur NEGATIVE NEGATIVE mg/dL   Protein, ur 100 (A) NEGATIVE mg/dL   Nitrite NEGATIVE NEGATIVE   Leukocytes, UA LARGE (A) NEGATIVE   RBC / HPF 6-30 0 - 5 RBC/hpf   WBC, UA TOO NUMEROUS TO COUNT 0 - 5 WBC/hpf   Bacteria, UA RARE (A) NONE SEEN   Squamous Epithelial / LPF 6-30 (A) NONE SEEN   Mucus PRESENT      Imaging:  No results found.  MAU Course: Vitals and nursing notes reviewed I have ordered labs and reviewed them -  unremarkable Fern negative, no pooling Cultures pending Treatments given in MAU: None  Discussed patient with Dr. Willis Modena whom agreed with plan  No signs of PTL or PPROM  I personally reviewed the patient's NST today, found to be REACTIVE. 130 bpm, mod var, +accels, no decels. CTX: none.   MDM: Plan of care reviewed with patient, including labs and tests ordered and medical  treatment.   Assessment: 1. Vaginal discharge during pregnancy in third trimester   2. Braxton Hicks contractions     Plan: Discharge home in stable condition.  Preterm labor precautions and fetal kick counts reviewed Reassurance given Handout given Follow-up with OB provider   Luiz Blare, DO OB Fellow [facility], [name] 04/13/2017 11:21 PM

## 2017-04-13 NOTE — MAU Note (Signed)
Pt started having back and abdominal pain the last few days and it has gotten worse each day.  Shortness of breath is new today.  Pain in her ribs as well the last few days.  States she started leaking around 2100 a small gush of clear mucus.  Denies VB.  Last intercourse was about a week ago.

## 2017-04-14 LAB — WET PREP, GENITAL
CLUE CELLS WET PREP: NONE SEEN
Sperm: NONE SEEN
Trich, Wet Prep: NONE SEEN
Yeast Wet Prep HPF POC: NONE SEEN

## 2017-04-14 NOTE — Discharge Instructions (Signed)
Premature Rupture and Preterm Premature Rupture of Membranes A sac made up of membranes surrounds your baby in the womb (uterus). Rupture of membranes is when this sac breaks open. This is also known as your "water breaking." When this sac breaks before labor starts, it is called premature rupture of membranes (PROM). If this happens before 37 weeks of being pregnant, it is called preterm premature rupture of membranes (PPROM). PPROM is serious. It needs medical care right away. What increases the risk of PPROM? PPROM is more likely to happen in women who:  Have an infection.  Have had PPROM before.  Have a cervix that is short.  Have bleeding during the second or third trimester.  Have a low BMI. This is a measure of body fat.  Smoke.  Use drugs.  Have a low socioeconomic status.  What problems can be caused by PROM and PPROM? This condition creates health dangers for the mother and the baby. These include:  Giving birth to the baby too early (prematurely).  Getting a serious infection of the placenta (chorioamnionitis).  Having the placenta detach from the uterus early (placental abruption).  Squeezing of the umbilical cord.  Getting a serious infection after delivery.  What are the signs of PROM and PPROM?  A sudden gush of fluid from the vagina.  A slow leak of fluid from the vagina.  Your underwear is wet. What should I do if I think my water broke? Call your doctor right away. You will need to go to the hospital to get checked right away. What happens if I am told that I have PROM or PPROM? You will have tests done at the hospital.  If you have PROM, you may be given medicine to start labor (be induced). This may be done if you are not having contractions during the 24 hours after your water broke.  If you have PPROM and are not having contractions, you may be given medicine to start labor. It will depend on how far along you are in your pregnancy.  If you have  PPROM:  You and your baby will be watched closely to see if you have infections or other problems.  You may be given: ? An antibiotic medicine. This can stop an infection from starting. ? A steroid medicine. This can help your baby's lungs develop faster. ? A medicine to help prevent cerebral palsy in your baby. ? A medicine to stop early labor (preterm labor).  You may be told to stay in bed except to use the bathroom (bed rest).  You may be given medicine to start labor. This may be done if there are problems with you or the baby.  Your treatment will depend on many factors. Contact a doctor if:  Your water breaks and you are not having contractions. Get help right away if:  Your water breaks before you are [redacted] weeks pregnant. Summary  When your water breaks before labor starts, it is called premature rupture of membranes (PROM).  When PROM happens before 37 weeks of pregnancy, it is called preterm premature rupture of membranes (PPROM).  If you are not having contractions, your labor may be started for you. This information is not intended to replace advice given to you by your health care provider. Make sure you discuss any questions you have with your health care provider. Document Released: 07/15/2008 Document Revised: 01/07/2016 Document Reviewed: 01/07/2016 Elsevier Interactive Patient Education  2017 [employer].

## 2017-04-16 ENCOUNTER — Other Ambulatory Visit: Payer: Self-pay | Admitting: Advanced Practice Midwife

## 2017-04-16 DIAGNOSIS — Z3491 Encounter for supervision of normal pregnancy, unspecified, first trimester: Secondary | ICD-10-CM

## 2017-04-16 DIAGNOSIS — F411 Generalized anxiety disorder: Secondary | ICD-10-CM

## 2017-04-16 DIAGNOSIS — O26891 Other specified pregnancy related conditions, first trimester: Secondary | ICD-10-CM

## 2017-04-16 DIAGNOSIS — O209 Hemorrhage in early pregnancy, unspecified: Secondary | ICD-10-CM

## 2017-04-16 DIAGNOSIS — N730 Acute parametritis and pelvic cellulitis: Secondary | ICD-10-CM

## 2017-04-16 DIAGNOSIS — R109 Unspecified abdominal pain: Secondary | ICD-10-CM

## 2017-04-17 LAB — GC/CHLAMYDIA PROBE AMP (~~LOC~~) NOT AT ARMC
CHLAMYDIA, DNA PROBE: NEGATIVE
NEISSERIA GONORRHEA: NEGATIVE

## 2017-04-19 ENCOUNTER — Other Ambulatory Visit: Payer: Self-pay

## 2017-04-19 ENCOUNTER — Encounter (HOSPITAL_COMMUNITY): Payer: Self-pay | Admitting: *Deleted

## 2017-04-19 ENCOUNTER — Inpatient Hospital Stay (HOSPITAL_COMMUNITY)
Admission: AD | Admit: 2017-04-19 | Discharge: 2017-04-19 | Disposition: A | Discharge status: Home / Self Care | Payer: Medicaid Other | Source: Ambulatory Visit | Attending: Obstetrics and Gynecology | Admitting: Obstetrics and Gynecology

## 2017-04-19 DIAGNOSIS — O98813 Other maternal infectious and parasitic diseases complicating pregnancy, third trimester: Secondary | ICD-10-CM | POA: Diagnosis not present

## 2017-04-19 DIAGNOSIS — O4693 Antepartum hemorrhage, unspecified, third trimester: Secondary | ICD-10-CM | POA: Diagnosis present

## 2017-04-19 DIAGNOSIS — Z3A32 32 weeks gestation of pregnancy: Secondary | ICD-10-CM | POA: Insufficient documentation

## 2017-04-19 DIAGNOSIS — B373 Candidiasis of vulva and vagina: Secondary | ICD-10-CM | POA: Insufficient documentation

## 2017-04-19 DIAGNOSIS — O26613 Liver and biliary tract disorders in pregnancy, third trimester: Secondary | ICD-10-CM | POA: Diagnosis not present

## 2017-04-19 DIAGNOSIS — K831 Obstruction of bile duct: Secondary | ICD-10-CM | POA: Diagnosis not present

## 2017-04-19 DIAGNOSIS — Z3A39 39 weeks gestation of pregnancy: Secondary | ICD-10-CM | POA: Diagnosis not present

## 2017-04-19 DIAGNOSIS — B3731 Acute candidiasis of vulva and vagina: Secondary | ICD-10-CM

## 2017-04-19 LAB — URINALYSIS, ROUTINE W REFLEX MICROSCOPIC
Bilirubin Urine: NEGATIVE
GLUCOSE, UA: NEGATIVE mg/dL
Ketones, ur: 20 mg/dL — AB
Nitrite: NEGATIVE
PH: 6 (ref 5.0–8.0)
Protein, ur: 30 mg/dL — AB
SPECIFIC GRAVITY, URINE: 1.026 (ref 1.005–1.030)

## 2017-04-19 MED ORDER — TERCONAZOLE 0.4 % VA CREA: 1.0000 | TOPICAL_CREAM | Freq: Every day | VAGINAL | 0 refills | Status: DC

## 2017-04-19 NOTE — Discharge Instructions (Signed)

## 2017-04-19 NOTE — MAU Provider Note (Signed)
History     CSN: [id number]  Arrival date and time: 04/19/17 0814   First Provider Initiated Contact with Patient 04/19/17 [phone]      Chief Complaint  Patient presents with  . Abdominal Pain  . Vaginal Bleeding   HPI  [name] is a 26 y.o. G3P2002 at [redacted]w[redacted]d who presents to MAU today with complaint of vaginal bleeding since early this morning. She denies bleeding currently. She denies history of previa. She has had gushes of clear/yellow discharge. She was seen in MAU last week with similar symptoms and was diagnosed with a yeast infection. She did not pick up Rx for Terazol. She has had intermittent cramping. She rates pain at 7/10. She has not taken anything for pain. She reports normal FM. She does have cholestasis with this pregnancy and is on Ursodiol.   OB History    Gravida Para Term Preterm AB Living   3 2 2  0   2   SAB TAB Ectopic Multiple Live Births         0 2      Past Medical History:  Diagnosis Date  . Asthma    Last used 08/30/14; weekly inhaler use  . Cholestasis of pregnancy in third trimester 07/25/2015  . Eczema   . Headache(784.0)    migraines  . NST (non-stress test) nonreactive   . SVD (spontaneous vaginal delivery) 07/26/2015    Past Surgical History:  Procedure Laterality Date  . MOUTH SURGERY    . TYMPANOSTOMY TUBE PLACEMENT    . WISDOM TOOTH EXTRACTION      Family History  Problem Relation Age of Onset  . Diabetes Maternal Grandmother   . Hypertension Maternal Grandmother   . Stroke Maternal Grandmother   . Hypertension Mother     Social History   Tobacco Use  . Smoking status: Never Smoker  . Smokeless tobacco: Never Used  Substance Use Topics  . Alcohol use: No  . Drug use: No    Allergies:  Allergies  Allergen Reactions  . Pulmicort [Budesonide] Palpitations  . Tomato Hives and Itching  . Mushroom Extract Complex Hives and Itching  . Promethazine Rash and Other (See Comments)    Reaction:  Dizziness     No  medications prior to admission.    Review of Systems  Constitutional: Negative for fever.  Gastrointestinal: Positive for abdominal pain. Negative for constipation, diarrhea, nausea and vomiting.  Genitourinary: Positive for dysuria, vaginal bleeding and vaginal discharge. Negative for frequency and urgency.   Physical Exam   Blood pressure 96/81, pulse 100, temperature (!) 97.3 F (36.3 C), temperature source Oral, resp. rate 16, weight 140 lb 4 oz (63.6 kg), last menstrual period 09/02/2016, not currently breastfeeding.  Physical Exam  Nursing note and vitals reviewed. Constitutional: She is oriented to person, place, and time. She appears well-developed and well-nourished. No distress.  HENT:  Head: Normocephalic and atraumatic.  Cardiovascular: Tachycardia present.  Respiratory: Effort normal.  GI: Soft. She exhibits no distension and no mass. There is no tenderness. There is no rebound and no guarding.  Genitourinary: Uterus is enlarged (appropriate for GA). Uterus is not tender. No bleeding in the vagina. Vaginal discharge (copious amount of thick, yellow discharge noted) found.  Neurological: She is alert and oriented to person, place, and time.  Skin: Skin is warm and dry. No erythema.  Psychiatric: She has a normal mood and affect.    Results for orders placed or performed during the hospital encounter  of 04/19/17 (from the past 24 hour(s))  Urinalysis, Routine w reflex microscopic     Status: Abnormal   Collection Time: 04/19/17  8:30 AM  Result Value Ref Range   Color, Urine YELLOW YELLOW   APPearance HAZY (A) CLEAR   Specific Gravity, Urine 1.026 1.005 - 1.030   pH 6.0 5.0 - 8.0   Glucose, UA NEGATIVE NEGATIVE mg/dL   Hgb urine dipstick MODERATE (A) NEGATIVE   Bilirubin Urine NEGATIVE NEGATIVE   Ketones, ur 20 (A) NEGATIVE mg/dL   Protein, ur 30 (A) NEGATIVE mg/dL   Nitrite NEGATIVE NEGATIVE   Leukocytes, UA MODERATE (A) NEGATIVE   RBC / HPF 0-5 0 - 5 RBC/hpf    WBC, UA 6-30 0 - 5 WBC/hpf   Bacteria, UA RARE (A) NONE SEEN   Squamous Epithelial / LPF 0-5 (A) NONE SEEN   Mucus PRESENT    Fetal Monitoring: Baseline: 125 bpm Variability: moderate Accelerations: 15 x 15 Decelerations: none Contractions: none  MAU Course  Procedures None  MDM UA today Exam still consistent with yeast. Wet prep was not reperformed.   Discussed patient with Dr. Melba Coon. Agrees with plan for treatment with Terazol. Follow-up as scheduled.  Assessment and Plan  A:  SIUP at [redacted]w[redacted]d Yeast vulvovaginitis   P: Discharge home Rx for Terazol 7 given to patient  Preterm labor precautions discussed Patient advised to follow-up with [facility] as scheduled on Friday Patient may return to MAU as needed or if her condition were to change or worsen   Kerry Hough, PA-C 04/19/2017, 10:54 AM

## 2017-04-19 NOTE — MAU Note (Signed)
Has been cramping last couple days. No hx of PTL. Hx of cholestasis with pregnancies.

## 2017-04-19 NOTE — MAU Note (Signed)
Cramping, like her period is coming on. Felt a small gush, when she wiped it was blood. Denies any placental issues on [place]

## 2017-04-29 ENCOUNTER — Inpatient Hospital Stay (HOSPITAL_COMMUNITY)
Admission: AD | Admit: 2017-04-29 | Discharge: 2017-04-29 | Disposition: A | Discharge status: Home / Self Care | Payer: Medicaid Other | Source: Ambulatory Visit | Attending: Obstetrics and Gynecology | Admitting: Obstetrics and Gynecology

## 2017-04-29 ENCOUNTER — Encounter (HOSPITAL_COMMUNITY): Payer: Self-pay

## 2017-04-29 DIAGNOSIS — Z3A34 34 weeks gestation of pregnancy: Secondary | ICD-10-CM | POA: Diagnosis not present

## 2017-04-29 DIAGNOSIS — O4700 False labor before 37 completed weeks of gestation, unspecified trimester: Secondary | ICD-10-CM

## 2017-04-29 DIAGNOSIS — O26893 Other specified pregnancy related conditions, third trimester: Secondary | ICD-10-CM | POA: Insufficient documentation

## 2017-04-29 DIAGNOSIS — R197 Diarrhea, unspecified: Secondary | ICD-10-CM | POA: Insufficient documentation

## 2017-04-29 DIAGNOSIS — O4703 False labor before 37 completed weeks of gestation, third trimester: Secondary | ICD-10-CM

## 2017-04-29 DIAGNOSIS — O479 False labor, unspecified: Secondary | ICD-10-CM

## 2017-04-29 LAB — URINALYSIS, ROUTINE W REFLEX MICROSCOPIC
Bilirubin Urine: NEGATIVE
GLUCOSE, UA: NEGATIVE mg/dL
Hgb urine dipstick: NEGATIVE
Ketones, ur: 80 mg/dL — AB
Nitrite: NEGATIVE
PH: 5 (ref 5.0–8.0)
Protein, ur: 30 mg/dL — AB
Specific Gravity, Urine: 1.024 (ref 1.005–1.030)

## 2017-04-29 MED ORDER — ONDANSETRON 8 MG PO TBDP
8.0000 mg | ORAL_TABLET | Freq: Once | ORAL | Status: AC
  Administered 2017-04-29: 8 mg via ORAL
  Filled 2017-04-29: qty 1

## 2017-04-29 NOTE — MAU Note (Signed)
Ctx since yesterday- feels them every 4-5 min for 2-3 hours and then they calm down and then they come back.  Diarrhea since this AM- 6x time. States it is watery  No vaginal bleeding or discharge. No LOF. Some decreased FM last night  but has felt movement today.

## 2017-04-29 NOTE — MAU Provider Note (Signed)
History     CSN: [id number]  Arrival date and time: 04/29/17 1504   First Provider Initiated Contact with Patient 04/29/17 1613      Chief Complaint  Patient presents with  . Contractions  . Diarrhea   HPI Wanda Nixon is a 26 y.o. G3P2002 at [redacted]w[redacted]d who presents with diarrhea & contractions. Reports irregular contractions since last night. States they have stopped since arriving to MAU & denies pain at this time. Endorses diarrhea that started this morning. Reports 6 episodes of watery stool. Denies sick contacts, recent travel, or abx. Mild nausea; no vomiting. Denies fever/chills, bloody stool, vaginal bleeding, or LOF. Positive fetal movement. Reports taking ursodiol for the last month for cholestasis.   OB History    Gravida Para Term Preterm AB Living   3 2 2  0   2   SAB TAB Ectopic Multiple Live Births         0 2      Past Medical History:  Diagnosis Date  . Asthma    Last used 08/30/14; weekly inhaler use  . Cholestasis of pregnancy in third trimester 07/25/2015  . Eczema   . Headache(784.0)    migraines  . NST (non-stress test) nonreactive   . SVD (spontaneous vaginal delivery) 07/26/2015    Past Surgical History:  Procedure Laterality Date  . MOUTH SURGERY    . TYMPANOSTOMY TUBE PLACEMENT    . WISDOM TOOTH EXTRACTION      Family History  Problem Relation Age of Onset  . Diabetes Maternal Grandmother   . Hypertension Maternal Grandmother   . Stroke Maternal Grandmother   . Hypertension Mother     Social History   Tobacco Use  . Smoking status: Never Smoker  . Smokeless tobacco: Never Used  Substance Use Topics  . Alcohol use: No  . Drug use: No    Allergies:  Allergies  Allergen Reactions  . Pulmicort [Budesonide] Palpitations  . Tomato Hives and Itching  . Mushroom Extract Complex Hives and Itching  . Promethazine Rash and Other (See Comments)    Reaction:  Dizziness     Medications Prior to Admission  Medication Sig Dispense Refill  Last Dose  . acetaminophen (TYLENOL) 325 MG tablet Take 325-650 mg by mouth every 6 (six) hours as needed for mild pain, moderate pain, fever or headache.   03/31/2017 at Unknown time  . albuterol (PROVENTIL HFA;VENTOLIN HFA) 108 (90 Base) MCG/ACT inhaler Inhale 2 puffs into the lungs every 6 (six) hours as needed for wheezing or shortness of breath.   Past Week at Unknown time  . cyclobenzaprine (FLEXERIL) 10 MG tablet Take 0.5 tablets (5 mg total) by mouth 2 (two) times daily as needed for muscle spasms. 20 tablet 0 Past Week at Unknown time  . ondansetron (ZOFRAN ODT) 8 MG disintegrating tablet Take 1 tablet (8 mg total) by mouth every 8 (eight) hours as needed for nausea or vomiting. 20 tablet 0 04/12/2017 at Unknown time  . Prenatal MV-Min-FA-Omega-3 (PRENATAL GUMMIES/DHA & FA) 0.4-32.5 MG CHEW Chew 2 each by mouth daily.   03/31/2017 at Unknown time  . terconazole (TERAZOL 7) 0.4 % vaginal cream Place 1 applicator vaginally at bedtime. 45 g 0     Review of Systems  Constitutional: Negative.   Gastrointestinal: Positive for diarrhea and nausea. Negative for abdominal pain, blood in stool, constipation and vomiting.  Genitourinary: Negative.    Physical Exam   Blood pressure 107/63, pulse 98, temperature 98.1 F (36.7 C),  temperature source Oral, resp. rate 18, last menstrual period 09/02/2016, not currently breastfeeding.  Physical Exam  Nursing note and vitals reviewed. Constitutional: She is oriented to person, place, and time. She appears well-developed and well-nourished. No distress.  HENT:  Head: Normocephalic and atraumatic.  Eyes: Conjunctivae are normal. Right eye exhibits no discharge. Left eye exhibits no discharge. No scleral icterus.  Neck: Normal range of motion.  Respiratory: Effort normal. No respiratory distress.  GI: Soft. There is no tenderness.  Genitourinary:  Genitourinary Comments: Dilation: 1 Effacement (%): Thick Cervical Position: Posterior Station:  Ballotable Exam by:: e Ukiah Trawick np   Neurological: She is alert and oriented to person, place, and time.  Skin: Skin is warm and dry. She is not diaphoretic.  Psychiatric: She has a normal mood and affect. Her behavior is normal. Judgment and thought content normal.    MAU Course  Procedures Results for orders placed or performed during the hospital encounter of 04/29/17 (from the past 24 hour(s))  Urinalysis, Routine w reflex microscopic     Status: Abnormal   Collection Time: 04/29/17  3:10 PM  Result Value Ref Range   Color, Urine YELLOW YELLOW   APPearance HAZY (A) CLEAR   Specific Gravity, Urine 1.024 1.005 - 1.030   pH 5.0 5.0 - 8.0   Glucose, UA NEGATIVE NEGATIVE mg/dL   Hgb urine dipstick NEGATIVE NEGATIVE   Bilirubin Urine NEGATIVE NEGATIVE   Ketones, ur 80 (A) NEGATIVE mg/dL   Protein, ur 30 (A) NEGATIVE mg/dL   Nitrite NEGATIVE NEGATIVE   Leukocytes, UA LARGE (A) NEGATIVE   RBC / HPF 0-5 0 - 5 RBC/hpf   WBC, UA 0-5 0 - 5 WBC/hpf   Bacteria, UA RARE (A) NONE SEEN   Squamous Epithelial / LPF 6-30 (A) NONE SEEN   Mucus PRESENT     MDM NST:  Baseline: 135 bpm, Variability: Good {> 6 bpm), Accelerations: Reactive and Decelerations: Absent VSS SVE 1/thick/ballotable U/a 80+ ketones -- offered IV fluids but patient declines. Zofran 8 mg odt given. Patient requesting to be discharged now.  C/w Dr. Marvel Plan. Informed of complaint, assessment, SVE, & u/a. Ok to discharge home  Assessment and Plan  A: 1. Diarrhea of presumed infectious origin   2. [redacted] weeks gestation of pregnancy   3. Preterm contractions    P: Discharge home [place] for diarrhea Keep f/u with OB Discussed reasons to return to [place] 04/29/2017, 4:14 PM

## 2017-04-29 NOTE — MAU Note (Signed)
Urine in lab 

## 2017-04-29 NOTE — Discharge Instructions (Signed)

## 2017-05-02 NOTE — L&D Delivery Note (Signed)
Delivery Note  Minutes after epidural pt had uncontrollable urge to push. Pt quickly delivered a viable female infant in bed, CNM in room. I arrived during delivery of baby and took over. Baby was delivered at 2:53 PM  (Presentation: ;  OA).  APGAR:8,9 , ; weight pending .   Placenta status:delivered intact, shultz , .  Cord:3vc with the following complications: none  Cord pH: n/a  Anesthesia:  Epidural Episiotomy:  none Lacerations:  none Est. Blood Loss (mL):  none  Mom to postpartum.  Baby to Couplet care / Skin to Skin.Plans for circ in office Pt still deciding on BTL - will discuss with family but would like done tomorrow if decides to proceed.   Isaiah Serge 05/19/2017, 3:04 PM

## 2017-05-08 ENCOUNTER — Telehealth (HOSPITAL_COMMUNITY): Payer: Self-pay | Admitting: *Deleted

## 2017-05-08 ENCOUNTER — Encounter (HOSPITAL_COMMUNITY): Payer: Self-pay | Admitting: *Deleted

## 2017-05-08 LAB — OB RESULTS CONSOLE GBS: GBS: POSITIVE

## 2017-05-08 NOTE — Telephone Encounter (Signed)
Preadmission screen  

## 2017-05-09 ENCOUNTER — Telehealth (HOSPITAL_COMMUNITY): Payer: Self-pay | Admitting: *Deleted

## 2017-05-09 ENCOUNTER — Encounter (HOSPITAL_COMMUNITY): Payer: Self-pay | Admitting: *Deleted

## 2017-05-09 NOTE — Telephone Encounter (Signed)
Preadmission screen  

## 2017-05-18 NOTE — H&P (Signed)
Wanda Nixon is a 27 y.o. female  presenting for scheduled iol for cholestasis of pregnancy at [redacted] weeks gestation. Pt is dates by LMp and confirmed with an 8 week [place]. She has a hx of asthma ( controlled on inhaler in pregnancy), anxiety/depression ( on celexa), anemia ( on iron bid), and cholestasis of pregnancy ( on urosodiol). She is GBS positive. She desires BTL postpartum  OB History    Gravida Para Term Preterm AB Living   3 2 2  0   2   SAB TAB Ectopic Multiple Live Births         0 2     Past Medical History:  Diagnosis Date  . Anemia   . Asthma    Last used 08/30/14; weekly inhaler use  . Cholestasis of pregnancy in third trimester 07/25/2015  . Depression    celexa off for several months  . Eczema   . Headache(784.0)    migraines  . NST (non-stress test) nonreactive   . SVD (spontaneous vaginal delivery) 07/26/2015   Past Surgical History:  Procedure Laterality Date  . MOUTH SURGERY    . TYMPANOSTOMY TUBE PLACEMENT    . WISDOM TOOTH EXTRACTION     Family History: family history includes Diabetes in her maternal grandmother; Hypertension in her maternal grandmother and mother; Stroke in her maternal grandmother. Social History:  reports that  has never smoked. she has never used smokeless tobacco. She reports that she does not drink alcohol or use drugs.     Maternal Diabetes: No Genetic Screening: Normal - nl first trimester screen Maternal Ultrasounds/Referrals: Normal Fetal Ultrasounds or other Referrals:  None Maternal Substance Abuse:  No Significant Maternal Medications:  Meds include: Other:  see HPI Significant Maternal Lab Results:  Lab values include: Group B Strep positive Other Comments:  None  Review of Systems  Constitutional: Positive for malaise/fatigue. Negative for chills, fever and weight loss.  Eyes: Negative for blurred vision, double vision and photophobia.  Respiratory: Negative for shortness of breath.   Cardiovascular: Negative for chest  pain and palpitations.  Gastrointestinal: Positive for abdominal pain. Negative for nausea and vomiting.  Genitourinary: Negative for dysuria.  Musculoskeletal: Positive for myalgias.  Skin: Positive for itching and rash.  Neurological: Negative for dizziness and headaches.  Psychiatric/Behavioral: Positive for depression. Negative for hallucinations, substance abuse and suicidal ideas. The patient is nervous/anxious.    Maternal Medical History:  Reason for admission: Nausea.      Last menstrual period 09/02/2016, not currently breastfeeding. Exam Physical Exam  Prenatal labs: ABO, Rh: --/Positive/-- (07/13 0000) Antibody: Negative (07/13 0000) Rubella: Immune (07/13 0000) RPR: Nonreactive (07/13 0000)  HBsAg: Negative (07/13 0000)  HIV: Non-reactive (07/13 0000)  GBS:     Assessment/Plan: [id number] at [redacted] weeks gestation with cholestasis of pregnancy and anemia for iol Pitocin/AROM Pain control prn PCN for GBS  Anticipate svd BTL pp if able   Isaiah Serge 05/18/2017, 5:39 PM

## 2017-05-19 ENCOUNTER — Inpatient Hospital Stay (HOSPITAL_COMMUNITY): Payer: Medicaid Other | Admitting: Anesthesiology

## 2017-05-19 ENCOUNTER — Inpatient Hospital Stay (HOSPITAL_COMMUNITY)
Admission: RE | Admit: 2017-05-19 | Discharge: 2017-05-21 | DRG: 805 | Disposition: A | Discharge status: Home / Self Care | Payer: Medicaid Other | Source: Ambulatory Visit | Attending: Obstetrics and Gynecology | Admitting: Obstetrics and Gynecology

## 2017-05-19 ENCOUNTER — Encounter (HOSPITAL_COMMUNITY): Payer: Self-pay

## 2017-05-19 DIAGNOSIS — F329 Major depressive disorder, single episode, unspecified: Secondary | ICD-10-CM | POA: Diagnosis present

## 2017-05-19 DIAGNOSIS — F419 Anxiety disorder, unspecified: Secondary | ICD-10-CM | POA: Diagnosis present

## 2017-05-19 DIAGNOSIS — K831 Obstruction of bile duct: Secondary | ICD-10-CM | POA: Diagnosis present

## 2017-05-19 DIAGNOSIS — O2662 Liver and biliary tract disorders in childbirth: Secondary | ICD-10-CM | POA: Diagnosis present

## 2017-05-19 DIAGNOSIS — O26613 Liver and biliary tract disorders in pregnancy, third trimester: Secondary | ICD-10-CM

## 2017-05-19 DIAGNOSIS — O9902 Anemia complicating childbirth: Secondary | ICD-10-CM | POA: Diagnosis present

## 2017-05-19 DIAGNOSIS — D649 Anemia, unspecified: Secondary | ICD-10-CM | POA: Diagnosis present

## 2017-05-19 DIAGNOSIS — O99824 Streptococcus B carrier state complicating childbirth: Secondary | ICD-10-CM | POA: Diagnosis present

## 2017-05-19 DIAGNOSIS — O9952 Diseases of the respiratory system complicating childbirth: Secondary | ICD-10-CM | POA: Diagnosis present

## 2017-05-19 DIAGNOSIS — Z3A37 37 weeks gestation of pregnancy: Secondary | ICD-10-CM | POA: Diagnosis not present

## 2017-05-19 DIAGNOSIS — J45909 Unspecified asthma, uncomplicated: Secondary | ICD-10-CM | POA: Diagnosis present

## 2017-05-19 DIAGNOSIS — O99344 Other mental disorders complicating childbirth: Secondary | ICD-10-CM | POA: Diagnosis present

## 2017-05-19 LAB — CBC
HCT: 27.7 % — ABNORMAL LOW (ref 36.0–46.0)
Hemoglobin: 9.1 g/dL — ABNORMAL LOW (ref 12.0–15.0)
MCH: 24.2 pg — ABNORMAL LOW (ref 26.0–34.0)
MCHC: 32.9 g/dL (ref 30.0–36.0)
MCV: 73.7 fL — ABNORMAL LOW (ref 78.0–100.0)
PLATELETS: 249 10*3/uL (ref 150–400)
RBC: 3.76 MIL/uL — ABNORMAL LOW (ref 3.87–5.11)
RDW: 15.7 % — AB (ref 11.5–15.5)
WBC: 5.3 10*3/uL (ref 4.0–10.5)

## 2017-05-19 LAB — HEPATIC FUNCTION PANEL
ALK PHOS: 154 U/L — AB (ref 38–126)
ALT: 10 U/L — ABNORMAL LOW (ref 14–54)
AST: 18 U/L (ref 15–41)
Albumin: 3.2 g/dL — ABNORMAL LOW (ref 3.5–5.0)
BILIRUBIN DIRECT: 0.1 mg/dL (ref 0.1–0.5)
BILIRUBIN TOTAL: 0.7 mg/dL (ref 0.3–1.2)
Indirect Bilirubin: 0.6 mg/dL (ref 0.3–0.9)
Total Protein: 7 g/dL (ref 6.5–8.1)

## 2017-05-19 LAB — TYPE AND SCREEN
ABO/RH(D): O POS
ANTIBODY SCREEN: NEGATIVE

## 2017-05-19 LAB — RPR: RPR Ser Ql: NONREACTIVE

## 2017-05-19 MED ORDER — ONDANSETRON HCL 4 MG/2ML IJ SOLN: 4.0000 mg | Freq: Four times a day (QID) | INTRAMUSCULAR | Status: DC | PRN

## 2017-05-19 MED ORDER — OXYCODONE HCL 5 MG PO TABS
5.0000 mg | ORAL_TABLET | ORAL | Status: DC | PRN
  Administered 2017-05-21: 5 mg via ORAL
  Filled 2017-05-19 (×2): qty 1

## 2017-05-19 MED ORDER — PHENYLEPHRINE 40 MCG/ML (10ML) SYRINGE FOR IV PUSH (FOR BLOOD PRESSURE SUPPORT)
80.0000 ug | PREFILLED_SYRINGE | INTRAVENOUS | Status: DC | PRN
  Filled 2017-05-19: qty 5

## 2017-05-19 MED ORDER — BUTORPHANOL TARTRATE 1 MG/ML IJ SOLN
1.0000 mg | INTRAMUSCULAR | Status: DC | PRN
  Administered 2017-05-19: 1 mg via INTRAVENOUS
  Filled 2017-05-19: qty 1

## 2017-05-19 MED ORDER — LIDOCAINE HCL (PF) 1 % IJ SOLN
30.0000 mL | INTRAMUSCULAR | Status: DC | PRN
  Filled 2017-05-19: qty 30

## 2017-05-19 MED ORDER — OXYTOCIN 40 UNITS IN LACTATED RINGERS INFUSION - SIMPLE MED
1.0000 m[IU]/min | INTRAVENOUS | Status: DC
  Administered 2017-05-19: 2 m[IU]/min via INTRAVENOUS
  Filled 2017-05-19: qty 1000

## 2017-05-19 MED ORDER — COCONUT OIL OIL: 1.0000 "application " | TOPICAL_OIL | Status: DC | PRN

## 2017-05-19 MED ORDER — ZOLPIDEM TARTRATE 5 MG PO TABS: 5.0000 mg | ORAL_TABLET | Freq: Every evening | ORAL | Status: DC | PRN

## 2017-05-19 MED ORDER — SOD CITRATE-CITRIC ACID 500-334 MG/5ML PO SOLN: 30.0000 mL | ORAL | Status: DC | PRN

## 2017-05-19 MED ORDER — DIBUCAINE 1 % RE OINT: 1.0000 "application " | TOPICAL_OINTMENT | RECTAL | Status: DC | PRN

## 2017-05-19 MED ORDER — DIPHENHYDRAMINE HCL 25 MG PO CAPS
25.0000 mg | ORAL_CAPSULE | Freq: Four times a day (QID) | ORAL | Status: DC | PRN
  Administered 2017-05-19: 25 mg via ORAL
  Filled 2017-05-19: qty 1

## 2017-05-19 MED ORDER — OXYCODONE-ACETAMINOPHEN 5-325 MG PO TABS: 2.0000 | ORAL_TABLET | ORAL | Status: DC | PRN

## 2017-05-19 MED ORDER — DIPHENHYDRAMINE HCL 50 MG/ML IJ SOLN: 12.5000 mg | INTRAMUSCULAR | Status: DC | PRN

## 2017-05-19 MED ORDER — PHENYLEPHRINE 40 MCG/ML (10ML) SYRINGE FOR IV PUSH (FOR BLOOD PRESSURE SUPPORT)
80.0000 ug | PREFILLED_SYRINGE | INTRAVENOUS | Status: DC | PRN
  Filled 2017-05-19: qty 5
  Filled 2017-05-19: qty 10

## 2017-05-19 MED ORDER — ALBUTEROL SULFATE (2.5 MG/3ML) 0.083% IN NEBU: 3.0000 mL | INHALATION_SOLUTION | Freq: Four times a day (QID) | RESPIRATORY_TRACT | Status: DC | PRN

## 2017-05-19 MED ORDER — SIMETHICONE 80 MG PO CHEW: 80.0000 mg | CHEWABLE_TABLET | ORAL | Status: DC | PRN

## 2017-05-19 MED ORDER — LIDOCAINE HCL (PF) 1 % IJ SOLN
INTRAMUSCULAR | Status: DC | PRN
  Administered 2017-05-19: 6 mL via EPIDURAL
  Administered 2017-05-19: 7 mL via EPIDURAL

## 2017-05-19 MED ORDER — LACTATED RINGERS IV SOLN
500.0000 mL | INTRAVENOUS | Status: DC | PRN
  Administered 2017-05-19: 500 mL via INTRAVENOUS

## 2017-05-19 MED ORDER — TERBUTALINE SULFATE 1 MG/ML IJ SOLN
0.2500 mg | Freq: Once | INTRAMUSCULAR | Status: DC | PRN
  Filled 2017-05-19: qty 1

## 2017-05-19 MED ORDER — PENICILLIN G POTASSIUM 5000000 UNITS IJ SOLR
5.0000 10*6.[IU] | Freq: Once | INTRAVENOUS | Status: AC
  Administered 2017-05-19: 5 10*6.[IU] via INTRAVENOUS
  Filled 2017-05-19: qty 5

## 2017-05-19 MED ORDER — EPHEDRINE 5 MG/ML INJ
10.0000 mg | INTRAVENOUS | Status: DC | PRN
  Filled 2017-05-19: qty 2

## 2017-05-19 MED ORDER — OXYCODONE HCL 5 MG PO TABS: 10.0000 mg | ORAL_TABLET | ORAL | Status: DC | PRN

## 2017-05-19 MED ORDER — ACETAMINOPHEN 325 MG PO TABS
650.0000 mg | ORAL_TABLET | ORAL | Status: DC | PRN
  Administered 2017-05-20 – 2017-05-21 (×2): 650 mg via ORAL
  Filled 2017-05-19 (×4): qty 2

## 2017-05-19 MED ORDER — BENZOCAINE-MENTHOL 20-0.5 % EX AERO
1.0000 "application " | INHALATION_SPRAY | CUTANEOUS | Status: DC | PRN
  Administered 2017-05-19: 1 via TOPICAL
  Filled 2017-05-19: qty 56

## 2017-05-19 MED ORDER — TETANUS-DIPHTH-ACELL PERTUSSIS 5-2.5-18.5 LF-MCG/0.5 IM SUSP: 0.5000 mL | Freq: Once | INTRAMUSCULAR | Status: DC

## 2017-05-19 MED ORDER — LACTATED RINGERS IV SOLN: 500.0000 mL | Freq: Once | INTRAVENOUS | Status: DC

## 2017-05-19 MED ORDER — LACTATED RINGERS IV SOLN
INTRAVENOUS | Status: DC
  Administered 2017-05-19: 08:00:00 via INTRAVENOUS

## 2017-05-19 MED ORDER — ONDANSETRON HCL 4 MG/2ML IJ SOLN: 4.0000 mg | INTRAMUSCULAR | Status: DC | PRN

## 2017-05-19 MED ORDER — OXYCODONE-ACETAMINOPHEN 5-325 MG PO TABS: 1.0000 | ORAL_TABLET | ORAL | Status: DC | PRN

## 2017-05-19 MED ORDER — WITCH HAZEL-GLYCERIN EX PADS: 1.0000 "application " | MEDICATED_PAD | CUTANEOUS | Status: DC | PRN

## 2017-05-19 MED ORDER — OXYTOCIN 40 UNITS IN LACTATED RINGERS INFUSION - SIMPLE MED: 2.5000 [IU]/h | INTRAVENOUS | Status: DC

## 2017-05-19 MED ORDER — OXYTOCIN BOLUS FROM INFUSION
500.0000 mL | Freq: Once | INTRAVENOUS | Status: AC
  Administered 2017-05-19: 500 mL via INTRAVENOUS

## 2017-05-19 MED ORDER — FENTANYL 2.5 MCG/ML BUPIVACAINE 1/10 % EPIDURAL INFUSION (WH - ANES)
14.0000 mL/h | INTRAMUSCULAR | Status: DC | PRN
  Administered 2017-05-19 (×2): 14 mL/h via EPIDURAL
  Filled 2017-05-19: qty 100

## 2017-05-19 MED ORDER — ACETAMINOPHEN 325 MG PO TABS: 650.0000 mg | ORAL_TABLET | ORAL | Status: DC | PRN

## 2017-05-19 MED ORDER — PENICILLIN G POT IN DEXTROSE 60000 UNIT/ML IV SOLN
3.0000 10*6.[IU] | INTRAVENOUS | Status: DC
  Administered 2017-05-19: 3 10*6.[IU] via INTRAVENOUS
  Filled 2017-05-19 (×4): qty 50

## 2017-05-19 MED ORDER — ONDANSETRON HCL 4 MG PO TABS: 4.0000 mg | ORAL_TABLET | ORAL | Status: DC | PRN

## 2017-05-19 MED ORDER — PRENATAL MULTIVITAMIN CH
1.0000 | ORAL_TABLET | Freq: Every day | ORAL | Status: DC
  Administered 2017-05-20: 1 via ORAL
  Filled 2017-05-19: qty 1

## 2017-05-19 MED ORDER — IBUPROFEN 600 MG PO TABS
600.0000 mg | ORAL_TABLET | Freq: Four times a day (QID) | ORAL | Status: DC
  Administered 2017-05-19 – 2017-05-21 (×5): 600 mg via ORAL
  Filled 2017-05-19 (×7): qty 1

## 2017-05-19 MED ORDER — SENNOSIDES-DOCUSATE SODIUM 8.6-50 MG PO TABS
2.0000 | ORAL_TABLET | ORAL | Status: DC
  Administered 2017-05-19: 2 via ORAL
  Filled 2017-05-19: qty 2

## 2017-05-19 NOTE — Progress Notes (Signed)
Patient ID: Wanda Nixon, female   DOB: 19-Aug-1990, 27 y.o.   MRN: [id number]  Called to stand-by for this pt as she was completely dilated, +3 station and feeling a huge amount of pressure. FHR stable 100-120s. Involuntary urge to push led to SVD @ 1453 of VMI. Cord notably short. Infant w/ spont dry; dried and placed on pt's lower abd. After 20min, cord clamped and cut by FOB. At this time, care turned over to Dr Terri Piedra.  Serita Grammes CNM 05/19/2017 3:07 PM

## 2017-05-19 NOTE — Progress Notes (Signed)
Pt was not weighed before arriving to room for IOL.  RN did not feel comfortable walking pt to scale do to FHR tracing with minimal variability. Charge nurse notified.

## 2017-05-19 NOTE — Progress Notes (Signed)
Patient ID: Wanda Nixon, female   DOB: November 30, 1990, 27 y.o.   MRN: [id number] Late entry Pt seen at about 1030pm. Pt doing well; tired She reports has decided against BTL; wants to get LARC instead Partner present and agrees with her plan Will cancel scheduled BTL at 930am on 05/20/17. May eat after midnight Routine pp care

## 2017-05-19 NOTE — Anesthesia Preprocedure Evaluation (Deleted)
Anesthesia Evaluation  Patient identified by MRN, date of birth, ID band Patient awake    Reviewed: Allergy & Precautions, H&P , NPO status , Patient's Chart, lab work & pertinent test results  Airway Mallampati: I  TM Distance: >3 FB Neck ROM: full    Dental no notable dental hx. (+) Teeth Intact   Pulmonary asthma ,    Pulmonary exam normal breath sounds clear to auscultation       Cardiovascular negative cardio ROS Normal cardiovascular exam Rhythm:Regular Rate:Normal     Neuro/Psych  Headaches, PSYCHIATRIC DISORDERS Anxiety Depression    GI/Hepatic negative GI ROS, Neg liver ROS,   Endo/Other  negative endocrine ROS  Renal/GU negative Renal ROS  negative genitourinary   Musculoskeletal negative musculoskeletal ROS (+)   Abdominal Normal abdominal exam  (+)   Peds  Hematology  (+) Blood dyscrasia, anemia ,   Anesthesia Other Findings   Reproductive/Obstetrics Desires post partum tubal ligation                             Anesthesia Physical  Anesthesia Plan  ASA: II  Anesthesia Plan: Epidural   Post-op Pain Management:    Induction:   PONV Risk Score and Plan: 3 and Ondansetron, Propofol infusion, Midazolam and Treatment may vary due to age or medical condition  Airway Management Planned: Natural Airway and Nasal Cannula  Additional Equipment:   Intra-op Plan:   Post-operative Plan:   Informed Consent: I have reviewed the patients History and Physical, chart, labs and discussed the procedure including the risks, benefits and alternatives for the proposed anesthesia with the patient or authorized representative who has indicated his/her understanding and acceptance.     Plan Discussed with: CRNA, Anesthesiologist and Surgeon  Anesthesia Plan Comments:         Anesthesia Quick Evaluation

## 2017-05-19 NOTE — Progress Notes (Signed)
Patient ID: Wanda Nixon, female   DOB: [birth year], 27 y.o.   MRN: [id number] Pt doing well. Appreciating pain with contractions No LOF. +FMs. Pt expresses doubt about desiring BTL. Admits her parents fell stronger about it than her VSS CAT 1,, 150 Ctxs q 2-51mins 4/80/-2  A/P: Multip progressing well on pitocin         AROM performed with moderate return of clear fluid          Anticipate svd           Advised pt I recommend LARC as she is uncertain about BTL

## 2017-05-19 NOTE — Progress Notes (Signed)
Patient ID: Wanda Nixon, female   DOB: [birth year], 27 y.o.   MRN: [id number] Pt reports painful contractions.  VSS EFM - cat 1 140 Ctxs q 45mins 6/100/-1  A/P: Multip progressing well on pitocin          May get epidural for pain relief prn         Anticipate svd

## 2017-05-19 NOTE — Anesthesia Procedure Notes (Addendum)
Epidural Patient location during procedure: OB Start time: 05/19/2017 2:23 PM End time: 05/19/2017 2:27 PM  Staffing Anesthesiologist: Lyn Hollingshead, MD Performed: anesthesiologist   Preanesthetic Checklist Completed: patient identified, site marked, surgical consent, pre-op evaluation, timeout performed, IV checked, risks and benefits discussed and monitors and equipment checked  Epidural Patient position: sitting Prep: site prepped and draped and DuraPrep Patient monitoring: continuous pulse ox and blood pressure Approach: midline Location: L3-L4 Injection technique: LOR air  Needle:  Needle type: Tuohy  Needle gauge: 17 G Needle length: 9 cm and 9 Needle insertion depth: 5 cm cm Catheter type: closed end flexible Catheter size: 19 Gauge Catheter at skin depth: 10 cm Test dose: negative and Other  Assessment Sensory level: T9 Events: blood not aspirated, injection not painful, no injection resistance, negative IV test and no paresthesia  Additional Notes Reason for block:procedure for pain

## 2017-05-19 NOTE — Anesthesia Preprocedure Evaluation (Signed)
Anesthesia Evaluation  Patient identified by MRN, date of birth, ID band Patient awake    Reviewed: Allergy & Precautions, H&P , NPO status , Patient's Chart, lab work & pertinent test results  Airway Mallampati: I  TM Distance: >3 FB Neck ROM: full    Dental no notable dental hx. (+) Teeth Intact   Pulmonary neg pulmonary ROS,    Pulmonary exam normal breath sounds clear to auscultation       Cardiovascular negative cardio ROS Normal cardiovascular exam Rhythm:Regular Rate:Normal     Neuro/Psych    GI/Hepatic negative GI ROS, Neg liver ROS,   Endo/Other  negative endocrine ROS  Renal/GU negative Renal ROS  negative genitourinary   Musculoskeletal negative musculoskeletal ROS (+)   Abdominal Normal abdominal exam  (+)   Peds  Hematology  (+) Blood dyscrasia, anemia ,   Anesthesia Other Findings   Reproductive/Obstetrics (+) Pregnancy                             Anesthesia Physical  Anesthesia Plan  ASA: II  Anesthesia Plan: Epidural   Post-op Pain Management:    Induction:   PONV Risk Score and Plan:   Airway Management Planned:   Additional Equipment:   Intra-op Plan:   Post-operative Plan:   Informed Consent: I have reviewed the patients History and Physical, chart, labs and discussed the procedure including the risks, benefits and alternatives for the proposed anesthesia with the patient or authorized representative who has indicated his/her understanding and acceptance.     Plan Discussed with:   Anesthesia Plan Comments:         Anesthesia Quick Evaluation

## 2017-05-19 NOTE — Anesthesia Pain Management Evaluation Note (Signed)
  CRNA Pain Management Visit Note  Patient: Wanda Nixon, 27 y.o., female  "Hello I am a member of the anesthesia team at [facility]. We have an anesthesia team available at all times to provide care throughout the hospital, including epidural management and anesthesia for C-section. I don't know your plan for the delivery whether it a natural birth, water birth, IV sedation, nitrous supplementation, doula or epidural, but we want to meet your pain goals."   1.Was your pain managed to your expectations on prior hospitalizations?   Yes   2.What is your expectation for pain management during this hospitalization?     IV pain meds and Nitrous Oxide  3.How can we help you reach that goal? Wants to deliver naturally without epidural but is not opposed to epidural  Record the patient's initial score and the patient's pain goal.   Pain: 0  Pain Goal: 8 [facility] wants you to be able to say your pain was always managed very well.  Tanika Bracco 05/19/2017

## 2017-05-19 NOTE — Lactation Note (Signed)
This note was copied from a baby's chart. Lactation Consultation Note  Patient Name: Wanda Nixon YHCWC'B Date: 05/19/2017 Reason for consult: Initial assessment;Early term 37-38.6wks;Infant < 6lbs;Other (Comment)(DAT (+))  6 hours old 58w 0d female who is being exclusively BF by his mother. Baby was nursing STS when entering the room, but he had a shallow latch, assisted mom with positioning and latch on, baby was able to get a deep latch, sucked with a few swallows. Mom has Hx of premature births, and some BF experience. She was able to hand express 2 ml of colostrum and spoon fed baby. Discussed with mom the importance of STS, feeding baby on cues and not on schedule; also stressed that because baby was small are born early she'll need to wake him up to feed. She got set up with a DEBP, she was also instructed how to use the tubing and pump parts from the hospital in her personal pump. Mom will pump at least 8 times/day and spoon feed that milk to baby. She's aware of DAT (+). OP services and BF support group. Feeding chart and BF brochures were also discussed.  Maternal Data Formula Feeding for Exclusion: No Has patient been taught Hand Expression?: Yes Does the patient have breastfeeding experience prior to this delivery?: Yes  Feeding Feeding Type: Breast Fed Length of feed: 15 min  LATCH Score Latch: Repeated attempts needed to sustain latch, nipple held in mouth throughout feeding, stimulation needed to elicit sucking reflex.  Audible Swallowing: A few with stimulation  Type of Nipple: Everted at rest and after stimulation  Comfort (Breast/Nipple): Soft / non-tender  Hold (Positioning): Assistance needed to correctly position infant at breast and maintain latch.  LATCH Score: 7  Interventions Interventions: Breast feeding basics reviewed;Assisted with latch;Skin to skin;Breast massage;Hand express;Breast compression;Adjust position;Support pillows;Position options;Expressed  milk;DEBP  Lactation Tools Discussed/Used Tools: Pump Breast pump type: Double-Electric Breast Pump WIC Program: No Pump Review: Setup, frequency, and cleaning;Milk Storage Initiated by:: MPeck Date initiated:: 05/19/17   Consult Status Consult Status: Follow-up Date: 05/20/17 Follow-up type: In-patient    Hayle Parisi Francene Boyers 05/19/2017, 9:00 PM

## 2017-05-19 NOTE — Anesthesia Postprocedure Evaluation (Signed)
Anesthesia Post Note  Patient: Wanda Nixon  Procedure(s) Performed: AN AD HOC LABOR EPIDURAL     Patient location during evaluation: Mother Baby Anesthesia Type: Epidural Level of consciousness: awake and alert and oriented Pain management: satisfactory to patient Vital Signs Assessment: post-procedure vital signs reviewed and stable Respiratory status: respiratory function stable Cardiovascular status: stable Postop Assessment: no headache, no backache, epidural receding, patient able to bend at knees, no signs of nausea or vomiting and adequate PO intake Anesthetic complications: no    Last Vitals:  Vitals:   05/19/17 1700 05/19/17 1800  BP: 120/71 126/76  Pulse: 82 100  Resp: 18 19  Temp:  36.8 C  SpO2: 99% 100%    Last Pain:  Vitals:   05/19/17 1800  TempSrc: Axillary  PainSc:    Pain Goal: Patients Stated Pain Goal: 8 (05/19/17 1228)               Tarig Zimmers

## 2017-05-20 ENCOUNTER — Encounter (HOSPITAL_COMMUNITY)
Admission: RE | Disposition: A | Discharge status: Home / Self Care | Payer: Self-pay | Source: Ambulatory Visit | Attending: Obstetrics and Gynecology

## 2017-05-20 ENCOUNTER — Inpatient Hospital Stay (HOSPITAL_COMMUNITY): Admission: RE | Admit: 2017-05-20 | Payer: Medicaid Other | Source: Ambulatory Visit

## 2017-05-20 LAB — CBC
HCT: 26.9 % — ABNORMAL LOW (ref 36.0–46.0)
Hemoglobin: 8.9 g/dL — ABNORMAL LOW (ref 12.0–15.0)
MCH: 24.7 pg — AB (ref 26.0–34.0)
MCHC: 33.1 g/dL (ref 30.0–36.0)
MCV: 74.5 fL — AB (ref 78.0–100.0)
PLATELETS: 242 10*3/uL (ref 150–400)
RBC: 3.61 MIL/uL — AB (ref 3.87–5.11)
RDW: 15.7 % — AB (ref 11.5–15.5)
WBC: 9.6 10*3/uL (ref 4.0–10.5)

## 2017-05-20 SURGERY — LIGATION, FALLOPIAN TUBE, POSTPARTUM
Anesthesia: Choice

## 2017-05-20 MED ORDER — FERROUS SULFATE 325 (65 FE) MG PO TABS
325.0000 mg | ORAL_TABLET | Freq: Two times a day (BID) | ORAL | Status: DC
  Administered 2017-05-20 – 2017-05-21 (×3): 325 mg via ORAL
  Filled 2017-05-20 (×2): qty 1

## 2017-05-20 MED ORDER — COMPLETENATE 29-1 MG PO CHEW
1.0000 | CHEWABLE_TABLET | Freq: Every day | ORAL | Status: DC
  Administered 2017-05-21: 1 via ORAL
  Filled 2017-05-20: qty 1

## 2017-05-20 NOTE — Progress Notes (Signed)
Patient ID: Wanda Nixon, female   DOB: 04-29-1991, 27 y.o.   MRN: [id number] Pt doing well. Feels fatigue but pain well controlled, lochia mild, milk "coming in". Denies fever, chills, sob or cp. Ambulating and tolerating diet well. Still happy about decision to not have BTL at this time.  Bonding well with baby VSS ABD - FF EXT - no homans  9.6>8.9<242  A/P: PPD#1 s/p svd - stable         Stable anemia- daily iron supp         Routine pp care         Likely discharge to home tomorrow

## 2017-05-20 NOTE — Clinical Social Work Note (Signed)
LCSW attempted to meet with MOB three times today but MOB had other things going on and asked LCSW to come back each time.  LCSW will follow up again in the am.

## 2017-05-21 MED ORDER — IBUPROFEN 600 MG PO TABS: 600.0000 mg | ORAL_TABLET | Freq: Four times a day (QID) | ORAL | 1 refills | Status: DC | PRN

## 2017-05-21 MED ORDER — COMPLETENATE 29-1 MG PO CHEW
1.0000 | CHEWABLE_TABLET | Freq: Every day | ORAL | 4 refills | Status: DC
Start: 2017-05-21 — End: 2018-06-19

## 2017-05-21 NOTE — Clinical Social Work Maternal (Signed)
  CLINICAL SOCIAL WORK MATERNAL/CHILD NOTE  Patient Details  Name: Wanda Nixon MRN: [id number] Date of Birth: 1990-08-03  Date:  05/21/2017  Clinical Social Worker Initiating Note:  Raynah Gomes Lavonna Monarch lcsw Date/Time: Initiated:  05/21/17/      Child's Name:  Wanda Nixon   Biological Parents:  Mother, Father   Need for Interpreter:  None   Reason for Referral:  Behavioral Health Concerns, Other (Comment)(Edinburgh score of 9)   Address:  [place] [address]    Phone number:  [phone] (home)     Additional phone number:   Household Members/Support Persons (HM/SP):   Household Member/Support Person 1   HM/SP Name Relationship DOB or Age  HM/SP -1 Atreyo Counsins spouse    HM/SP -2        HM/SP -3        HM/SP -4        HM/SP -5        HM/SP -6        HM/SP -7        HM/SP -8          Natural Supports (not living in the home):  Immediate Family, Extended Family   Professional Supports: None   Employment: Unemployed   Type of Work:     Education:  Programmer, systems   Homebound arranged:    Museum/gallery curator Resources:  Medicaid   Other Resources:  Physicist, medical , [place] Considerations Which May Impact Care:    Strengths:  Home prepared for child    Psychotropic Medications:         Pediatrician:       Pediatrician List:   [place]      Pediatrician Fax Number:    Risk Factors/Current Problems:  None   Cognitive State:  Alert , Insightful    Mood/Affect:  Bright , Calm , Happy    CSW Assessment: LCSW consulted fue to score of 9 on Edinburgh.  LCSW met with MOB and FOB at bedside to assess for services.  MOB reported that she had a 26 year old girl and a one year old boy at home.  MOB reported that she did not work and would get to be at home to bond with her newborn.  FOB reported that he would be home for one  week to help support MOB. MOB reported that she, FOB and all kids live with her mother in law who is very supportive and helpful.   MOB reported that she had some history of anxiety and used to take Celexa stating it helped.  MOB reported that she felt better and stopped taking the Celexa stating "I feel fine".  MOB reported that she would recognize if her anxiety returned and would follow up with Md.  LCSW provided education on post partem and provided a check list for MOB to assess her symptoms.  LCSW instructed MOB to follow up with MD should she have symptoms and she agreed.  MOB reported that she was all set with equipment stating FOB bought new car seat for newborn. No referrals at this time.   CSW Plan/Description:  No Further Intervention Required/No Barriers to Discharge    Carlean Jews, LCSW 05/21/2017, 11:09 AM

## 2017-05-21 NOTE — Discharge Instructions (Signed)
Nothing in vagina for 6 weeks.  No sex, tampons, and douching.  Other instructions as in [facility] Discharge Booklet. °

## 2017-05-21 NOTE — Lactation Note (Signed)
This note was copied from a baby's chart. Lactation Consultation Note  Patient Name: Wanda Nixon [id number] Date: 05/21/2017 Reason for consult: Follow-up assessment;Early term 37-38.6wks;Infant < 6lbs   Baby 54 hours old.  37 weeks < 5 lbs.   Baby recently bf on one breast and then mother pumped her other breast and expressed 7 ml of transitional breastmilk. Baby is cueing in crib.  Suggest either giving him the expressed breastmilk before or after bf on the other breast. Recommend mother post pump 4-6 times per day for 10-20 min with DEBP both breasts. Give baby back volume pumped at the next feeding. Reviewed cleaning and milk storage.  Mother is also supplementing w/ formula. Reviewed engorgement care and monitoring voids/stools.     Maternal Data    Feeding Feeding Type: Breast Fed  LATCH Score                   Interventions    Lactation Tools Discussed/Used     Consult Status Consult Status: Complete    Wanda Nixon 05/21/2017, 9:33 AM

## 2017-05-21 NOTE — Progress Notes (Signed)
Patient ID: Wanda Nixon, female   DOB: 09-14-1990, 28 y.o.   MRN: [id number] Pt doing well- rested well overnight. Worried about milk supply but still attempting breastfeeding. No fever or chills or lightheadedness. Bonding well with baby; lochia appropriate. Ready for discharge to home today VSS ABD - FF and below umbilicus EXT - no homans  A/P: PPD#2 s/p svd - stable         Discharge instructions reviewed; general counseling on breastfeeding; plan circ within next two weeks if still desires

## 2017-05-21 NOTE — Discharge Summary (Signed)
OB Discharge Summary     Patient Name: Wanda Nixon DOB: March 02, 1991 MRN: [id number]  Date of admission: 05/19/2017 Delivering MD: Serita Grammes D   Date of discharge: 05/21/2017  Admitting diagnosis: INDUCTION Intrauterine pregnancy: [redacted]w[redacted]d     Secondary diagnosis:  Active Problems:   Cholestasis during pregnancy in third trimester   SVD (spontaneous vaginal delivery)   Postpartum care following vaginal delivery  Additional problems: chronic anemia     Discharge diagnosis: Term Pregnancy Delivered                                                                                                Post partum procedures:none  Augmentation: AROM  Complications: None  Hospital course:  Induction of Labor With Vaginal Delivery   27 y.o. yo [phone] at [redacted]w[redacted]d was admitted to the hospital 05/19/2017 for induction of labor.  Indication for induction: Cholestasis of pregnancy.  Patient had an uncomplicated labor course as follows: Membrane Rupture Time/Date: 9:52 AM ,05/19/2017   Intrapartum Procedures: Episiotomy: None [1]                                         Lacerations:  None [1]  Patient had delivery of a Viable infant.  Information for the patient's newborn:  Sharea, Guinther [[id number]]  Delivery Method: Vag-Spont   05/19/2017  Details of delivery can be found in separate delivery note.  Patient had a routine postpartum course. Patient is discharged home 05/21/17.  Physical exam  Vitals:   05/19/17 2245 05/20/17 0642 05/20/17 1842 05/21/17 0500  BP: 116/74 100/64 117/76 124/88  Pulse: 87 86 81 75  Resp: 19 18 18 18   Temp: 98.3 F (36.8 C) 98.3 F (36.8 C) 98.4 F (36.9 C) 98.3 F (36.8 C)  TempSrc: Axillary Oral Oral Oral  SpO2: 100% 100%  100%  Weight:      Height:       General: alert, cooperative and no distress Lochia: appropriate Uterine Fundus: firm Incision: N/A DVT Evaluation: No evidence of DVT seen on physical exam. Labs: Lab Results  Component  Value Date   WBC 9.6 05/20/2017   HGB 8.9 (L) 05/20/2017   HCT 26.9 (L) 05/20/2017   MCV 74.5 (L) 05/20/2017   PLT 242 05/20/2017   CMP Latest Ref Rng & Units 05/19/2017  Glucose 65 - 99 mg/dL -  BUN 6 - 20 mg/dL -  Creatinine 0.44 - 1.00 mg/dL -  Sodium 135 - 145 mmol/L -  Potassium 3.5 - 5.1 mmol/L -  Chloride 101 - 111 mmol/L -  CO2 22 - 32 mmol/L -  Calcium 8.9 - 10.3 mg/dL -  Total Protein 6.5 - 8.1 g/dL 7.0  Total Bilirubin 0.3 - 1.2 mg/dL 0.7  Alkaline Phos 38 - 126 U/L 154(H)  AST 15 - 41 U/L 18  ALT 14 - 54 U/L 10(L)    Discharge instruction: per After Visit Summary and "Baby and Me Booklet".  After visit meds:  Allergies as of  05/21/2017      Reactions   Pulmicort [budesonide] Palpitations   Tomato Hives, Itching   Mushroom Extract Complex Hives, Itching   Promethazine Rash, Other (See Comments)   Reaction:  Dizziness       Medication List    TAKE these medications   acetaminophen-codeine 300-30 MG tablet Commonly known as:  TYLENOL #3 Take 1 tablet by mouth every 8 (eight) hours as needed for moderate pain.   albuterol 108 (90 Base) MCG/ACT inhaler Commonly known as:  PROVENTIL HFA;VENTOLIN HFA Inhale 2 puffs into the lungs every 6 (six) hours as needed for wheezing or shortness of breath.   ibuprofen 600 MG tablet Commonly known as:  ADVIL,MOTRIN Take 1 tablet (600 mg total) by mouth every 6 (six) hours as needed.   ondansetron 8 MG disintegrating tablet Commonly known as:  ZOFRAN ODT Take 1 tablet (8 mg total) by mouth every 8 (eight) hours as needed for nausea or vomiting.   prenatal multivitamin Tabs tablet Take 1 tablet by mouth daily at 12 noon.   prenatal vitamin w/FE, FA 29-1 MG Chew chewable tablet Chew 1 tablet by mouth daily at 12 noon.   ranitidine 150 MG tablet Commonly known as:  ZANTAC Take 150 mg by mouth daily as needed for heartburn.   triamcinolone cream 0.1 % Commonly known as:  KENALOG Apply 1 application topically 2  (two) times daily.   ursodiol 500 MG tablet Commonly known as:  ACTIGALL Take 500 mg by mouth 2 (two) times daily.       Diet: routine diet  Activity: Advance as tolerated. Pelvic rest for 6 weeks.   Outpatient follow up:6 weeks Follow up Appt:No future appointments. Follow up Visit:No Follow-up on file.  Postpartum contraception: IUD unsure, Tubal Ligation and Undecided  Newborn Data: Live born female  Birth Weight: 5 lb 1.7 oz (2316 g) APGAR: 9, 9  Newborn Delivery   Birth date/time:  05/19/2017 14:53:00 Delivery type:  Vaginal, Spontaneous     Baby Feeding: Bottle and Breast Disposition:home with mother   05/21/2017 Isaiah Serge, DO

## 2017-06-30 DIAGNOSIS — Z3202 Encounter for pregnancy test, result negative: Secondary | ICD-10-CM | POA: Diagnosis not present

## 2017-06-30 DIAGNOSIS — Z1389 Encounter for screening for other disorder: Secondary | ICD-10-CM | POA: Diagnosis not present

## 2017-06-30 DIAGNOSIS — Z3009 Encounter for other general counseling and advice on contraception: Secondary | ICD-10-CM | POA: Diagnosis not present

## 2017-06-30 DIAGNOSIS — Z30430 Encounter for insertion of intrauterine contraceptive device: Secondary | ICD-10-CM | POA: Diagnosis not present

## 2017-08-03 ENCOUNTER — Encounter: Payer: Self-pay | Admitting: Physician Assistant

## 2017-09-12 ENCOUNTER — Inpatient Hospital Stay (HOSPITAL_COMMUNITY)
Admission: AD | Admit: 2017-09-12 | Discharge: 2017-09-12 | Disposition: A | Discharge status: Home / Self Care | Payer: Self-pay | Source: Ambulatory Visit | Attending: Obstetrics and Gynecology | Admitting: Obstetrics and Gynecology

## 2017-09-12 ENCOUNTER — Inpatient Hospital Stay (HOSPITAL_COMMUNITY): Payer: Self-pay

## 2017-09-12 ENCOUNTER — Encounter (HOSPITAL_COMMUNITY): Payer: Self-pay | Admitting: *Deleted

## 2017-09-12 ENCOUNTER — Other Ambulatory Visit: Payer: Self-pay

## 2017-09-12 DIAGNOSIS — Z79891 Long term (current) use of opiate analgesic: Secondary | ICD-10-CM | POA: Insufficient documentation

## 2017-09-12 DIAGNOSIS — Z823 Family history of stroke: Secondary | ICD-10-CM | POA: Insufficient documentation

## 2017-09-12 DIAGNOSIS — Z91018 Allergy to other foods: Secondary | ICD-10-CM | POA: Insufficient documentation

## 2017-09-12 DIAGNOSIS — Z833 Family history of diabetes mellitus: Secondary | ICD-10-CM | POA: Insufficient documentation

## 2017-09-12 DIAGNOSIS — Z791 Long term (current) use of non-steroidal anti-inflammatories (NSAID): Secondary | ICD-10-CM | POA: Insufficient documentation

## 2017-09-12 DIAGNOSIS — B3731 Acute candidiasis of vulva and vagina: Secondary | ICD-10-CM

## 2017-09-12 DIAGNOSIS — R102 Pelvic and perineal pain: Secondary | ICD-10-CM | POA: Diagnosis present

## 2017-09-12 DIAGNOSIS — B373 Candidiasis of vulva and vagina: Secondary | ICD-10-CM | POA: Insufficient documentation

## 2017-09-12 DIAGNOSIS — Z79899 Other long term (current) drug therapy: Secondary | ICD-10-CM | POA: Insufficient documentation

## 2017-09-12 DIAGNOSIS — Z9889 Other specified postprocedural states: Secondary | ICD-10-CM | POA: Insufficient documentation

## 2017-09-12 DIAGNOSIS — Z888 Allergy status to other drugs, medicaments and biological substances status: Secondary | ICD-10-CM | POA: Insufficient documentation

## 2017-09-12 DIAGNOSIS — Z8249 Family history of ischemic heart disease and other diseases of the circulatory system: Secondary | ICD-10-CM | POA: Insufficient documentation

## 2017-09-12 LAB — URINALYSIS, ROUTINE W REFLEX MICROSCOPIC
Bilirubin Urine: NEGATIVE
GLUCOSE, UA: NEGATIVE mg/dL
Hgb urine dipstick: NEGATIVE
Ketones, ur: NEGATIVE mg/dL
LEUKOCYTES UA: NEGATIVE
Nitrite: NEGATIVE
PROTEIN: NEGATIVE mg/dL
Specific Gravity, Urine: 1.024 (ref 1.005–1.030)
pH: 5 (ref 5.0–8.0)

## 2017-09-12 LAB — WET PREP, GENITAL
Clue Cells Wet Prep HPF POC: NONE SEEN
Sperm: NONE SEEN
TRICH WET PREP: NONE SEEN

## 2017-09-12 LAB — POCT PREGNANCY, URINE: Preg Test, Ur: NEGATIVE

## 2017-09-12 MED ORDER — FLUCONAZOLE 150 MG PO TABS: 150.0000 mg | ORAL_TABLET | Freq: Every day | ORAL | 1 refills | Status: DC

## 2017-09-12 MED ORDER — TRAMADOL HCL 50 MG PO TABS: 50.0000 mg | ORAL_TABLET | Freq: Four times a day (QID) | ORAL | 0 refills | Status: DC | PRN

## 2017-09-12 MED ORDER — KETOROLAC TROMETHAMINE 60 MG/2ML IM SOLN
60.0000 mg | INTRAMUSCULAR | Status: AC
  Administered 2017-09-12: 60 mg via INTRAMUSCULAR
  Filled 2017-09-12: qty 2

## 2017-09-12 NOTE — MAU Note (Signed)
Pt presents with sharp stabbing lower left abdomen pain.  10/10 on pain scale.  Pain has been on and off since delivery of last baby in January.  Worsening symptoms over the past few days including nausea and loose stools.  Pain radiating to back.  Pt states history of ovarian cyst.  Pt has tried OTC motrin with no relief.

## 2017-09-12 NOTE — MAU Note (Signed)
Pt reports sharp, stabbing pain left lower abd off/on since January but has worsening over the last 3 days. States yesterday the pain made her nauseated and diaphoretic.

## 2017-09-12 NOTE — Discharge Instructions (Signed)
In 2020, the [facility] will be moving to the [place]. At that time, the MAU (Maternity Admissions Unit), where you are being seen today, will no longer take care of non-pregnant patients. We strongly encourage you to find a doctor's office before that time, so that you can be seen with any GYN concerns, like vaginal discharge, urinary tract infection, etc.. in a timely manner.  In order to make an office visit more convenient, the [facility] [place] [facility] will be offering evening hours with same-day appointments, walk-in appointments and scheduled appointments available during this time.  [facility] Hours: Monday - 8am - 7:30 pm with walk-in between 4pm- 7:30 pm Tuesday - 8 am - 5 pm (starting 08/01/17 we will be open late and accepting walk-ins from 4pm - 7:30pm) Wednesday - 8 am - 5 pm (starting 11/01/17 we will be open late and accepting walk-ins from 4pm - 7:30pm) Thursday 8 am - 5 pm (starting 02/01/18 we will be open late and accepting walk-ins from 4pm - 7:30pm) Friday 8 am - 5 pm  For an appointment please call the [facility] [place] @ [facility] at [phone]  For urgent needs, [facility] is also available for management of urgent GYN complaints such as vaginal discharge or urinary tract infections.

## 2017-09-12 NOTE — MAU Provider Note (Signed)
History     CSN: [id number]  Arrival date and time: 09/12/17 1116   First Provider Initiated Contact with Patient 09/12/17 1220      Chief Complaint  Patient presents with  . Abdominal Pain   HPI  Ms.  Wanda Nixon is a 27 y.o. year old [id number] non-pregnant female who presents to MAU reporting sharp stabbing lower LT abd pain (rated 10/10) since delivering her last baby in January. She reports the sx's are worsening over the last 3 days with nausea and loose stools. She reports the pain radiates to her back. She has tried OTC Motrin with no relief. She has a h/o ovarian cyst.  Past Medical History:  Diagnosis Date  . Anemia   . Asthma    Last used 08/30/14; weekly inhaler use, used inhaler 2 weeks ago  . Cholestasis of pregnancy in third trimester 07/25/2015  . Depression    celexa off for several months  . Eczema   . Headache(784.0)    migraines  . NST (non-stress test) nonreactive   . SVD (spontaneous vaginal delivery) 07/26/2015    Past Surgical History:  Procedure Laterality Date  . MOUTH SURGERY    . TYMPANOSTOMY TUBE PLACEMENT    . WISDOM TOOTH EXTRACTION      Family History  Problem Relation Age of Onset  . Diabetes Maternal Grandmother   . Hypertension Maternal Grandmother   . Stroke Maternal Grandmother   . Hypertension Mother     Social History   Tobacco Use  . Smoking status: Never Smoker  . Smokeless tobacco: Never Used  Substance Use Topics  . Alcohol use: No  . Drug use: No    Allergies:  Allergies  Allergen Reactions  . Pulmicort [Budesonide] Palpitations  . Tomato Hives and Itching  . Mushroom Extract Complex Hives and Itching  . Promethazine Rash and Other (See Comments)    Reaction:  Dizziness     Medications Prior to Admission  Medication Sig Dispense Refill Last Dose  . acetaminophen (TYLENOL) 500 MG tablet Take 500 mg by mouth every 6 (six) hours as needed for mild pain or moderate pain.   09/11/2017 at Unknown time  .  albuterol (PROVENTIL HFA;VENTOLIN HFA) 108 (90 Base) MCG/ACT inhaler Inhale 2 puffs into the lungs every 6 (six) hours as needed for wheezing or shortness of breath.   Past Month at Unknown time  . citalopram (CELEXA) 20 MG tablet Take 20 mg by mouth daily.  1 09/12/2017 at Unknown time  . ibuprofen (ADVIL,MOTRIN) 600 MG tablet Take 1 tablet (600 mg total) by mouth every 6 (six) hours as needed. 40 tablet 1 09/11/2017 at Unknown time  . Multiple Vitamins-Minerals (MULTIVITAMIN GUMMIES ADULT PO) Take 2 each by mouth daily.   09/11/2017 at Unknown time  . triamcinolone cream (KENALOG) 0.1 % Apply 1 application topically 2 (two) times daily as needed (rash/itching).    Past Week at Unknown time  . ondansetron (ZOFRAN ODT) 8 MG disintegrating tablet Take 1 tablet (8 mg total) by mouth every 8 (eight) hours as needed for nausea or vomiting. (Patient not taking: Reported on 09/12/2017) 20 tablet 0 Not Taking at Unknown time  . prenatal vitamin w/FE, FA (NATACHEW) 29-1 MG CHEW chewable tablet Chew 1 tablet by mouth daily at 12 noon. (Patient not taking: Reported on 09/12/2017) 60 tablet 4 Not Taking at Unknown time    Review of Systems  Constitutional: Negative.   HENT: Negative.   Eyes: Negative.   Respiratory:  Negative.   Cardiovascular: Negative.   Gastrointestinal: Negative.   Endocrine: Negative.   Genitourinary: Positive for pelvic pain (since delivery of baby in January).  Musculoskeletal: Negative.   Skin: Negative.   Allergic/Immunologic: Negative.   Neurological: Negative.   Hematological: Negative.   Psychiatric/Behavioral: Negative.    Physical Exam   Blood pressure 123/78, pulse 87, temperature 98.6 F (37 C), temperature source Oral, resp. rate 18, height 4\' 11"  (1.499 m), weight 132 lb (59.9 kg), last menstrual period 09/02/2017, SpO2 100 %.  Physical Exam  Genitourinary:  Genitourinary Comments: Uterus: mildly tender, SE: cervix is smooth, pink, no lesions, small amt of clear egg  white cervical d/c, small amt of thin white vaginal d/c leaking from the introitus -- WP, GC/CT done, closed/long/firm, no CMT or friability, (+) bilateral adnexal tenderness --> L>R    MAU Course  Procedures  MDM CCUA UPT Non-OB Pelvic TVUS Toradol 60 mg -- "still in pain"  *Consult with Dr. Terri Piedra @ 1345 - notified of patient's complaints, assessments, lab & U/S results, recommended tx plan d/c home, d/c home, have pt F/U in office for further w/u - ok to d/c home with Rx for Ultram 50 mg   Results for orders placed or performed during the hospital encounter of 09/12/17 (from the past 24 hour(s))  Urinalysis, Routine w reflex microscopic     Status: None   Collection Time: 09/12/17 11:33 AM  Result Value Ref Range   Color, Urine YELLOW YELLOW   APPearance CLEAR CLEAR   Specific Gravity, Urine 1.024 1.005 - 1.030   pH 5.0 5.0 - 8.0   Glucose, UA NEGATIVE NEGATIVE mg/dL   Hgb urine dipstick NEGATIVE NEGATIVE   Bilirubin Urine NEGATIVE NEGATIVE   Ketones, ur NEGATIVE NEGATIVE mg/dL   Protein, ur NEGATIVE NEGATIVE mg/dL   Nitrite NEGATIVE NEGATIVE   Leukocytes, UA NEGATIVE NEGATIVE  Pregnancy, urine POC     Status: None   Collection Time: 09/12/17 11:56 AM  Result Value Ref Range   Preg Test, Ur NEGATIVE NEGATIVE  Wet prep, genital     Status: Abnormal   Collection Time: 09/12/17 12:35 PM  Result Value Ref Range   Yeast Wet Prep HPF POC PRESENT (A) NONE SEEN   Trich, Wet Prep NONE SEEN NONE SEEN   Clue Cells Wet Prep HPF POC NONE SEEN NONE SEEN   WBC, Wet Prep HPF POC MODERATE (A) NONE SEEN   Sperm NONE SEEN     US Pelvis Transvanginal Non-ob (tv Only)  Result Date: 09/12/2017 CLINICAL DATA:  Recurrent left-sided pelvic pain for 4 months. EXAM: ULTRASOUND PELVIS TRANSVAGINAL TECHNIQUE: Transvaginal ultrasound examination of the pelvis was performed including evaluation of the uterus, ovaries, adnexal regions, and pelvic cul-de-sac. COMPARISON:  None. FINDINGS: Uterus  Measurements: 9.6 x 4.6 x 5.0 cm. No fibroids or other mass visualized. Endometrium Thickness: 12 mm.  No focal abnormality visualized. Right ovary Measurements: 4.5 x 2.0 x 2.0 cm. Normal appearance/no adnexal mass. Left ovary Measurements: 3.9 x 2.9 x 2.7 cm. Normal appearance/no adnexal mass. Other findings:  No abnormal free fluid. IMPRESSION: Negative. No pelvic mass or other significant abnormality identified. Electronically Signed   By: Earle Gell M.D.   On: 09/12/2017 13:25     Assessment and Plan  Pelvic pain  - Rx for Ultram 50 mg every 6 hrs prn pain - Advised to continue taking Motrin 600 mg every 6 hrs as well - Per Dr. Ivor Costa order, f/u with Dr. Melba Coon in the office -  Information provided on pelvic pain - Info given of MAU GYN care changes for 2020   Candida vaginitis - Rx for Diflucan 150 mg po x 1 dose - Information provided on yeast infection  - Discharge patient - Patient verbalized an understanding of the plan of care and agrees.    Laury Deep, MSN, CNM 09/12/2017, 12:20 PM

## 2017-11-17 IMAGING — US US OB COMP LESS 14 WK
1 series · 15 of 28 positions shown · non-contrast
Comparison: Pelvic ultrasound performed 10/18/2016

CLINICAL DATA: Subacute onset of generalized abdominal pain.
Initial encounter.

EXAM:
OBSTETRIC <14 WK ULTRASOUND
TECHNIQUE: Transabdominal ultrasound was performed for evaluation of the
gestation as well as the maternal uterus and adnexal regions.

[Series 1: us ob comp less 14 wk · 15 of 31 slices shown]
[im 1/31]
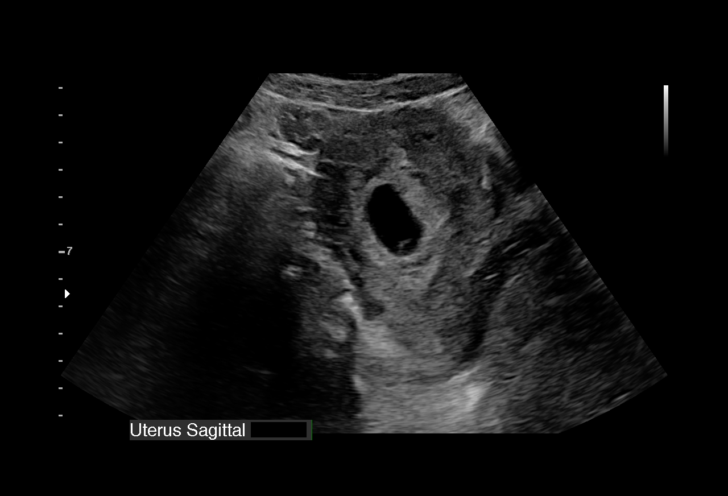
[im 3/31]
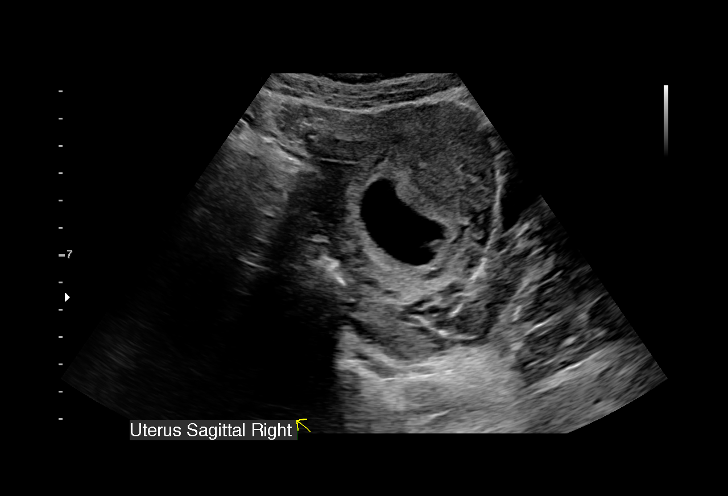
[im 5/31]
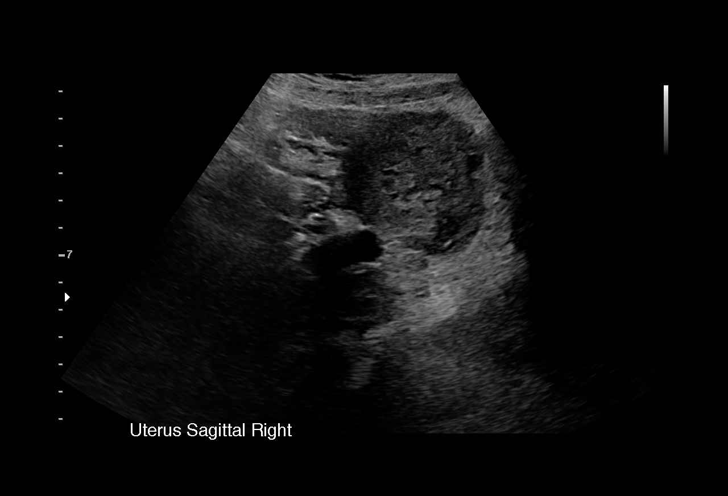
[im 7/31]
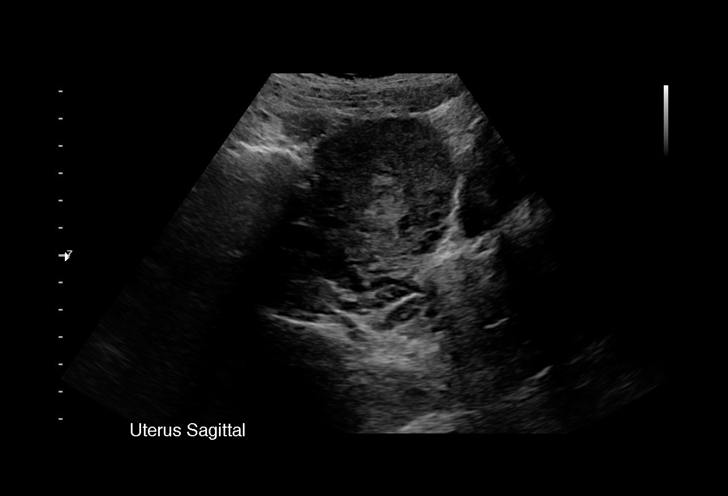
[im 9/31]
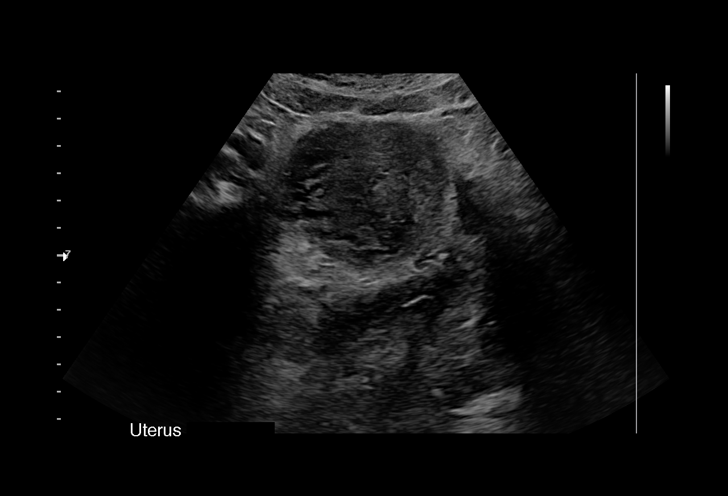
[im 12/31]
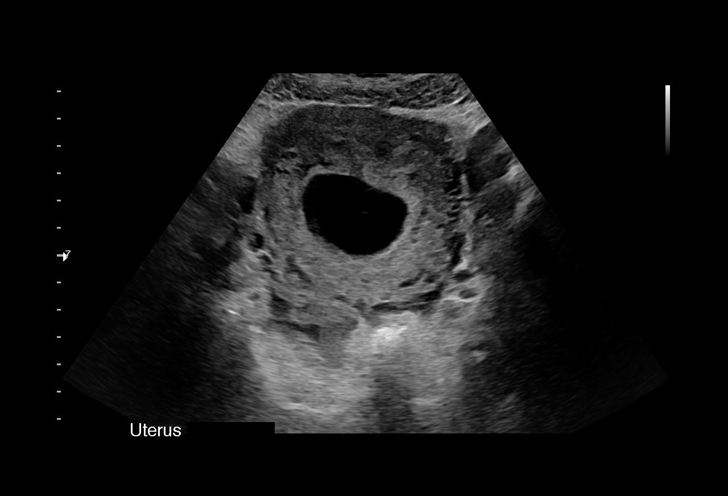
[im 14/31]
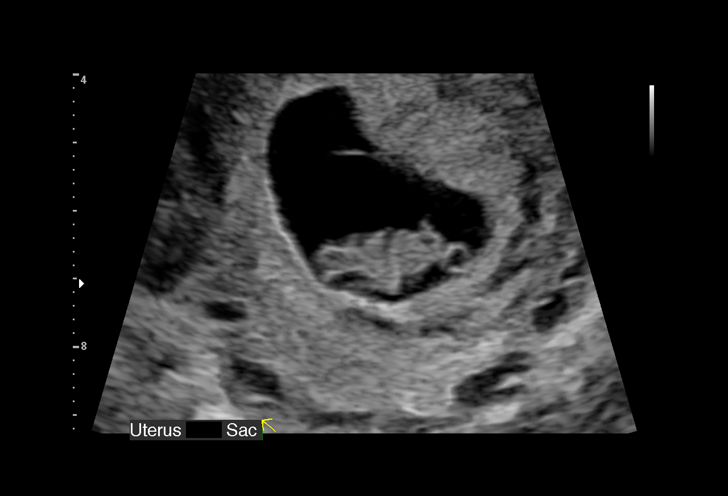
[im 16/31]
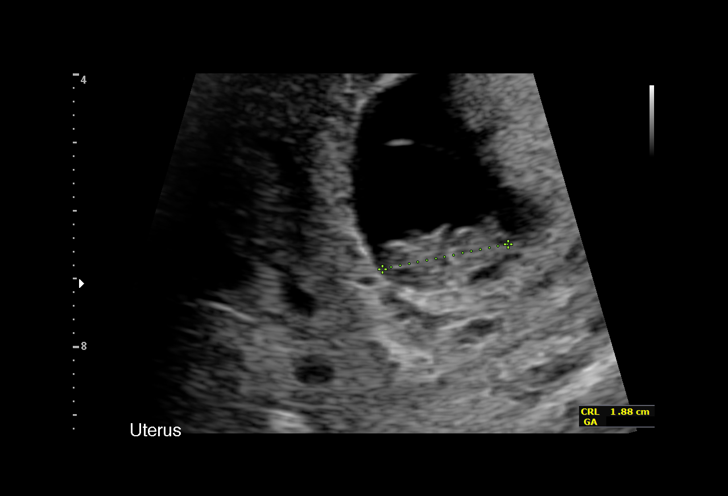
[im 17/31]
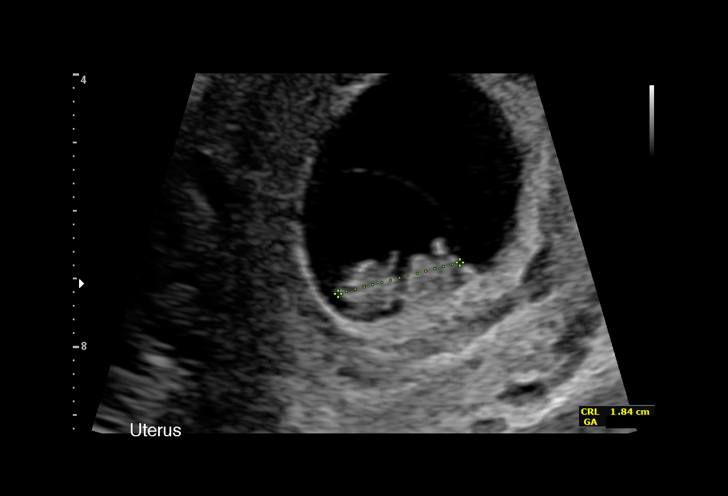
[im 19/31]
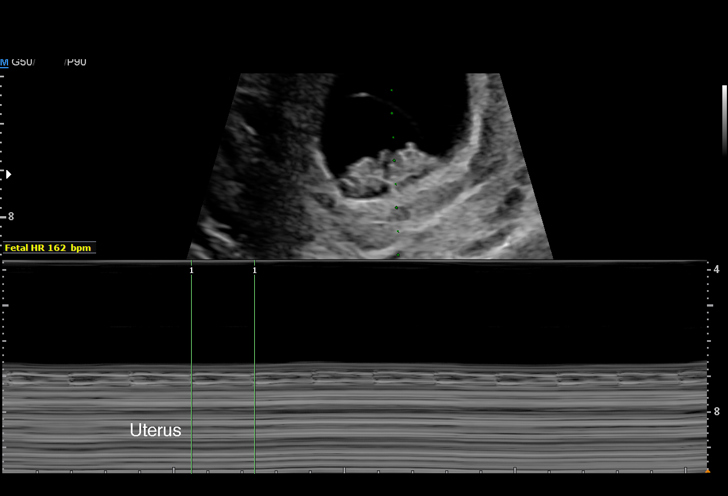
[im 22/31]
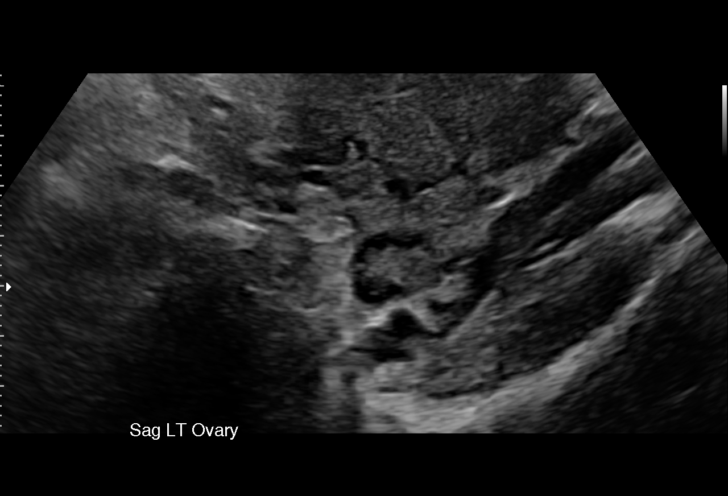
[im 24/31]
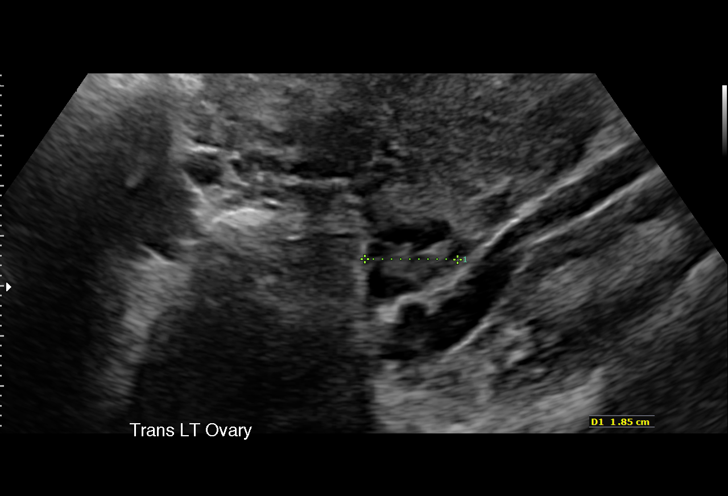
[im 26/31]
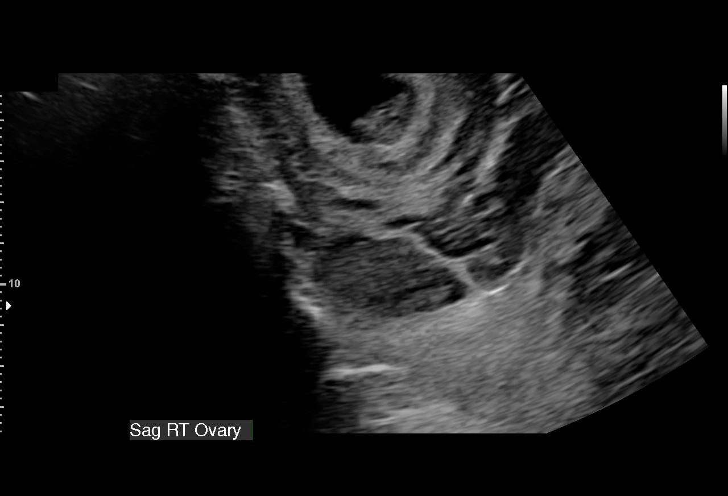
[im 28/31]
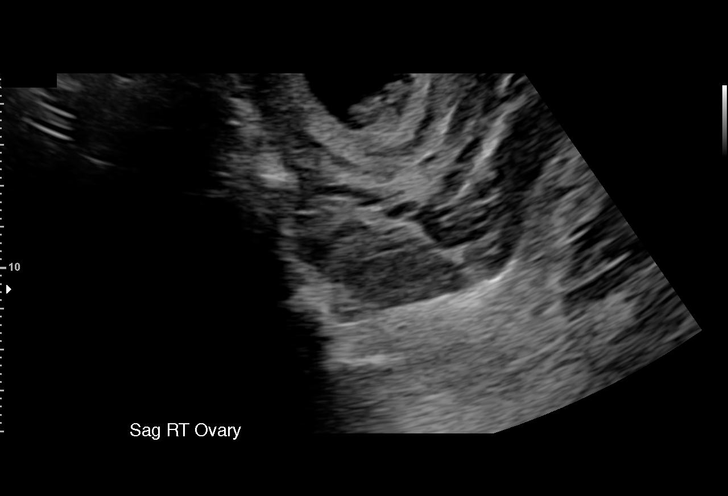
[im 31/31]
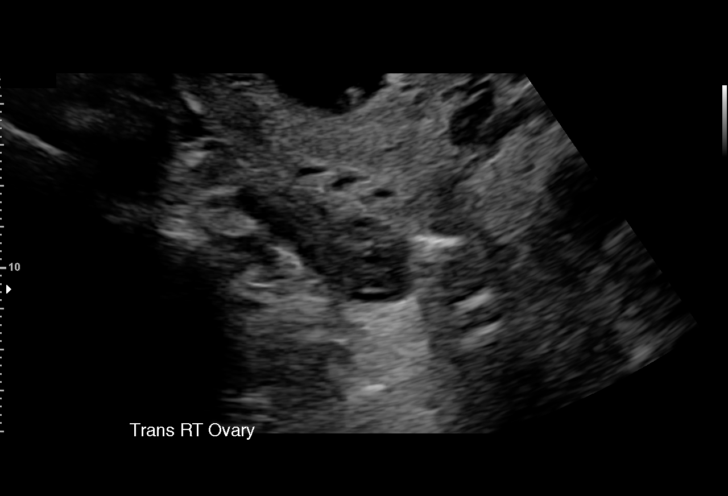

[15 of 28 positions shown; findings below may reference images not displayed]

FINDINGS: Intrauterine gestational sac: Single; visualized and normal in
shape.

Yolk sac:  Yes

Embryo:  Yes

Cardiac Activity: Yes

Heart Rate: 162 bpm

CRL:   1.87 cm   8 w 2 d                  US EDC: 06/15/2017

Subchorionic hemorrhage:  None visualized.

Maternal uterus/adnexae: The uterus is otherwise unremarkable in
appearance.

The ovaries are within normal limits. The right ovary measures 3.7 x
2.0 x 2.1 cm, while the left ovary measures 2.1 x 1.4 x 1.9 cm. No
suspicious adnexal masses are seen; there is no evidence for ovarian
torsion.

No free fluid is seen within the pelvic cul-de-sac.
IMPRESSION: Single live intrauterine pregnancy noted, with a crown-rump length
of 1.9 cm, corresponding to a gestational age of 8 weeks 2 days.
This matches the gestational age of 9 weeks 1 day by LMP, reflecting
an estimated date of delivery June 09, 2017.

## 2017-12-09 ENCOUNTER — Encounter (HOSPITAL_COMMUNITY): Payer: Self-pay

## 2017-12-09 ENCOUNTER — Inpatient Hospital Stay (HOSPITAL_COMMUNITY)
Admission: AD | Admit: 2017-12-09 | Discharge: 2017-12-09 | Disposition: A | Discharge status: Home / Self Care | Payer: Medicaid Other | Source: Ambulatory Visit | Attending: Obstetrics and Gynecology | Admitting: Obstetrics and Gynecology

## 2017-12-09 ENCOUNTER — Other Ambulatory Visit: Payer: Self-pay

## 2017-12-09 DIAGNOSIS — R109 Unspecified abdominal pain: Secondary | ICD-10-CM | POA: Diagnosis present

## 2017-12-09 DIAGNOSIS — O9989 Other specified diseases and conditions complicating pregnancy, childbirth and the puerperium: Secondary | ICD-10-CM

## 2017-12-09 DIAGNOSIS — R52 Pain, unspecified: Secondary | ICD-10-CM

## 2017-12-09 LAB — URINALYSIS, ROUTINE W REFLEX MICROSCOPIC
BACTERIA UA: NONE SEEN
BILIRUBIN URINE: NEGATIVE
GLUCOSE, UA: NEGATIVE mg/dL
HGB URINE DIPSTICK: NEGATIVE
Ketones, ur: NEGATIVE mg/dL
NITRITE: NEGATIVE
Protein, ur: NEGATIVE mg/dL
SPECIFIC GRAVITY, URINE: 1.027 (ref 1.005–1.030)
pH: 5 (ref 5.0–8.0)

## 2017-12-09 LAB — POCT PREGNANCY, URINE: PREG TEST UR: NEGATIVE

## 2017-12-09 MED ORDER — CYCLOBENZAPRINE HCL 10 MG PO TABS
10.0000 mg | ORAL_TABLET | Freq: Once | ORAL | Status: AC
  Administered 2017-12-09: 10 mg via ORAL
  Filled 2017-12-09: qty 1

## 2017-12-09 MED ORDER — KETOROLAC TROMETHAMINE 60 MG/2ML IM SOLN
60.0000 mg | Freq: Once | INTRAMUSCULAR | Status: AC
  Administered 2017-12-09: 60 mg via INTRAMUSCULAR
  Filled 2017-12-09: qty 2

## 2017-12-09 MED ORDER — CYCLOBENZAPRINE HCL 10 MG PO TABS: 10.0000 mg | ORAL_TABLET | Freq: Three times a day (TID) | ORAL | 0 refills | Status: DC | PRN

## 2017-12-09 NOTE — Progress Notes (Addendum)
G3P3 nonpreg. Here for back pain. Ibuprofen and tylenol taken at 0930. States helped some. Denies bleeding.   Pt states last period was early July "July 4th"   BP 117/85 (BP Location: Right Arm)   Pulse 96   Temp 97.9 F (36.6 C) (Oral)   Resp 18   Wt 59.4 kg   LMP 11/03/2017   BMI 26.46 kg/m    Hx Asthma - PRN meds PP anxiety - taking meds  SVD x3   Provider at bs assessing. Ordered meds for pain.   1707: medicated per order.   1800: pt called out wanting to go home. Provider made aware.   1807: d/c orders received along with flexeril med.  1810: D/c instructions given with pt understanding.  Pt left unit via ambulatory.

## 2017-12-09 NOTE — MAU Provider Note (Signed)
Patient Wanda Nixon is a 27 y.o. [phone]  Non-pregnant with pain all over.   She has had this pain in the past when she was pregnant. She had a negative pregnancy test at home.   She normally runs in the morning before work; she has not been able to run bc of her pain.  History     CSN: [id number]  Arrival date and time: 12/09/17 1555   None     Chief Complaint  Patient presents with  . Body Pain  . Abdominal Pain   Abdominal Pain  This is a new problem. The onset quality is sudden. The pain is located in the suprapubic region. The pain is at a severity of 7/10. The quality of the pain is cramping. Associated symptoms include nausea. Pertinent negatives include no constipation or vomiting.  She has tried warm compresses on her belly and her abdomen and it hasn't helped. She feels like she has been "hit by a train". She can't pinpoint exactly what is causing her pain.   She feels the pain is worst at night; she thinks her body is a "mess" from having children. Tylenol has not been helping. She had not been able to rest at home. She feels very stressed and overwhelmed at home and wonders if this could be contributing to it.  OB History    Gravida  3   Para  3   Term  3   Preterm  0   AB      Living  3     SAB      TAB      Ectopic      Multiple  0   Live Births  3           Past Medical History:  Diagnosis Date  . Anemia   . Asthma    Last used 08/30/14; weekly inhaler use, used inhaler 2 weeks ago  . Cholestasis of pregnancy in third trimester 07/25/2015  . Depression    celexa off for several months  . Eczema   . Headache(784.0)    migraines  . NST (non-stress test) nonreactive   . SVD (spontaneous vaginal delivery) 07/26/2015    Past Surgical History:  Procedure Laterality Date  . MOUTH SURGERY    . TYMPANOSTOMY TUBE PLACEMENT    . WISDOM TOOTH EXTRACTION      Family History  Problem Relation Age of Onset  . Diabetes Maternal Grandmother    . Hypertension Maternal Grandmother   . Stroke Maternal Grandmother   . Hypertension Mother     Social History   Tobacco Use  . Smoking status: Never Smoker  . Smokeless tobacco: Never Used  Substance Use Topics  . Alcohol use: No  . Drug use: No    Allergies:  Allergies  Allergen Reactions  . Pulmicort [Budesonide] Palpitations  . Tomato Hives and Itching  . Mushroom Extract Complex Hives and Itching  . Promethazine Rash and Other (See Comments)    Reaction:  Dizziness     Medications Prior to Admission  Medication Sig Dispense Refill Last Dose  . acetaminophen (TYLENOL) 500 MG tablet Take 500 mg by mouth every 6 (six) hours as needed for mild pain or moderate pain.   09/11/2017 at Unknown time  . albuterol (PROVENTIL HFA;VENTOLIN HFA) 108 (90 Base) MCG/ACT inhaler Inhale 2 puffs into the lungs every 6 (six) hours as needed for wheezing or shortness of breath.   Past Month at Unknown time  .  citalopram (CELEXA) 20 MG tablet Take 20 mg by mouth daily.  1 09/12/2017 at Unknown time  . fluconazole (DIFLUCAN) 150 MG tablet Take 1 tablet (150 mg total) by mouth daily. 1 tablet 1   . ibuprofen (ADVIL,MOTRIN) 600 MG tablet Take 1 tablet (600 mg total) by mouth every 6 (six) hours as needed. 40 tablet 1 09/11/2017 at Unknown time  . Multiple Vitamins-Minerals (MULTIVITAMIN GUMMIES ADULT PO) Take 2 each by mouth daily.   09/11/2017 at Unknown time  . ondansetron (ZOFRAN ODT) 8 MG disintegrating tablet Take 1 tablet (8 mg total) by mouth every 8 (eight) hours as needed for nausea or vomiting. (Patient not taking: Reported on 09/12/2017) 20 tablet 0 Not Taking at Unknown time  . prenatal vitamin w/FE, FA (NATACHEW) 29-1 MG CHEW chewable tablet Chew 1 tablet by mouth daily at 12 noon. (Patient not taking: Reported on 09/12/2017) 60 tablet 4 Not Taking at Unknown time  . traMADol (ULTRAM) 50 MG tablet Take 1 tablet (50 mg total) by mouth every 6 (six) hours as needed. 20 tablet 0   .  triamcinolone cream (KENALOG) 0.1 % Apply 1 application topically 2 (two) times daily as needed (rash/itching).    Past Week at Unknown time    Review of Systems  Constitutional: Negative.   HENT: Negative.   Respiratory: Negative.   Gastrointestinal: Positive for abdominal pain and nausea. Negative for constipation and vomiting.  Genitourinary: Negative.   Musculoskeletal: Negative.   Neurological: Negative.   Psychiatric/Behavioral: Negative.    Physical Exam   Blood pressure 117/85, pulse 96, temperature 97.9 F (36.6 C), temperature source Oral, resp. rate 18, weight 59.4 kg, last menstrual period 11/03/2017, unknown if currently breastfeeding.  Physical Exam  Constitutional: She appears well-developed and well-nourished.  HENT:  Head: Normocephalic.  Neck: Normal range of motion.  GI: Soft.  Musculoskeletal: Normal range of motion.  Neurological: She is alert.  Skin: Skin is warm.  Psychiatric: She has a normal mood and affect.    MAU Course  Procedures  MDM -She declines CBC -60 mg of toradol and 10 mg of Flexeril; patient rates her pain a 3/10, down from a 8/10.   -Dr. Melba Coon (private MD) consulted; agrees stable for discharge.  Assessment and Plan   1. Complaints of total body pain    2. Patient stable for discharge with recommendation to try heat, tylenol and ibuprofen. Try Flexeril if no relief.   3. Retake pregnancy test in 10 days if she thinks she is pregnant. Return to MAU if positive pregnancy test and any abdominal pain.   4. Make appt with Dr. Roe Rutherford office for birth control once she has insurance in September.    Mervyn Skeeters Thelda Gagan 12/09/2017, 4:40 PM

## 2017-12-09 NOTE — Discharge Instructions (Signed)

## 2017-12-09 NOTE — MAU Note (Signed)
Body pain for the past couple days  Cramping for a couple days, no bleeding  LMP 11/03/2017

## 2017-12-11 ENCOUNTER — Encounter (HOSPITAL_BASED_OUTPATIENT_CLINIC_OR_DEPARTMENT_OTHER): Payer: Self-pay | Admitting: Emergency Medicine

## 2017-12-11 ENCOUNTER — Other Ambulatory Visit: Payer: Self-pay

## 2017-12-11 ENCOUNTER — Emergency Department (HOSPITAL_BASED_OUTPATIENT_CLINIC_OR_DEPARTMENT_OTHER): Payer: Medicaid Other

## 2017-12-11 ENCOUNTER — Emergency Department (HOSPITAL_BASED_OUTPATIENT_CLINIC_OR_DEPARTMENT_OTHER)
Admission: EM | Admit: 2017-12-11 | Discharge: 2017-12-11 | Disposition: A | Discharge status: Home / Self Care | Payer: Medicaid Other | Attending: Emergency Medicine | Admitting: Emergency Medicine

## 2017-12-11 DIAGNOSIS — R102 Pelvic and perineal pain: Secondary | ICD-10-CM | POA: Diagnosis present

## 2017-12-11 DIAGNOSIS — N3090 Cystitis, unspecified without hematuria: Secondary | ICD-10-CM | POA: Insufficient documentation

## 2017-12-11 DIAGNOSIS — B3731 Acute candidiasis of vulva and vagina: Secondary | ICD-10-CM

## 2017-12-11 DIAGNOSIS — J45909 Unspecified asthma, uncomplicated: Secondary | ICD-10-CM | POA: Insufficient documentation

## 2017-12-11 DIAGNOSIS — Z79899 Other long term (current) drug therapy: Secondary | ICD-10-CM | POA: Insufficient documentation

## 2017-12-11 DIAGNOSIS — B373 Candidiasis of vulva and vagina: Secondary | ICD-10-CM | POA: Diagnosis not present

## 2017-12-11 DIAGNOSIS — N760 Acute vaginitis: Secondary | ICD-10-CM | POA: Diagnosis not present

## 2017-12-11 DIAGNOSIS — B9689 Other specified bacterial agents as the cause of diseases classified elsewhere: Secondary | ICD-10-CM

## 2017-12-11 DIAGNOSIS — R1900 Intra-abdominal and pelvic swelling, mass and lump, unspecified site: Secondary | ICD-10-CM | POA: Diagnosis not present

## 2017-12-11 LAB — PREGNANCY, URINE: Preg Test, Ur: NEGATIVE

## 2017-12-11 LAB — WET PREP, GENITAL
SPERM: NONE SEEN
Trich, Wet Prep: NONE SEEN

## 2017-12-11 LAB — URINALYSIS, ROUTINE W REFLEX MICROSCOPIC
Bilirubin Urine: NEGATIVE
Glucose, UA: NEGATIVE mg/dL
KETONES UR: NEGATIVE mg/dL
NITRITE: NEGATIVE
PH: 6 (ref 5.0–8.0)
Protein, ur: NEGATIVE mg/dL
SPECIFIC GRAVITY, URINE: 1.025 (ref 1.005–1.030)

## 2017-12-11 LAB — URINALYSIS, MICROSCOPIC (REFLEX)

## 2017-12-11 MED ORDER — METRONIDAZOLE 500 MG PO TABS: 500.0000 mg | ORAL_TABLET | Freq: Two times a day (BID) | ORAL | 0 refills | Status: DC

## 2017-12-11 MED ORDER — CEPHALEXIN 500 MG PO CAPS: 500.0000 mg | ORAL_CAPSULE | Freq: Four times a day (QID) | ORAL | 0 refills | Status: DC

## 2017-12-11 MED ORDER — METRONIDAZOLE 500 MG PO TABS
500.0000 mg | ORAL_TABLET | Freq: Once | ORAL | Status: AC
  Administered 2017-12-11: 500 mg via ORAL
  Filled 2017-12-11: qty 1

## 2017-12-11 MED ORDER — FLUCONAZOLE 150 MG PO TABS: 150.0000 mg | ORAL_TABLET | Freq: Once | ORAL | 0 refills | Status: AC

## 2017-12-11 MED ORDER — HYDROCODONE-ACETAMINOPHEN 5-325 MG PO TABS
1.0000 | ORAL_TABLET | Freq: Once | ORAL | Status: AC
  Administered 2017-12-11: 1 via ORAL
  Filled 2017-12-11: qty 1

## 2017-12-11 MED ORDER — CEPHALEXIN 250 MG PO CAPS
500.0000 mg | ORAL_CAPSULE | Freq: Once | ORAL | Status: AC
  Administered 2017-12-11: 500 mg via ORAL
  Filled 2017-12-11: qty 2

## 2017-12-11 MED ORDER — NAPROXEN 375 MG PO TABS: 375.0000 mg | ORAL_TABLET | Freq: Two times a day (BID) | ORAL | 0 refills | Status: DC

## 2017-12-11 NOTE — ED Triage Notes (Signed)
Patient states that she has had pain to her bilateral flank around to her bilateral lower pelvic area  - the patient reports that she is late for her period  - the patient denies any N/V

## 2017-12-11 NOTE — ED Notes (Signed)
Pt. Nasty about taking meds and doesn't want to take meds   Offered Pt. Crackers to take she stated no.

## 2017-12-11 NOTE — Discharge Instructions (Signed)
Follow up with your OBGYN.  Please take all of your antibiotics until finished!   You may develop abdominal discomfort or diarrhea from the antibiotic.  You may help offset this with probiotics which you can buy or get in yogurt. Do not eat or take the probiotics until 2 hours after your antibiotic. Do not take your medicine if develop an itchy rash, swelling in your mouth or lips, or difficulty breathing.  Please do not drink alcohol while taking Flagyl as it can make you feel very ill.  Your gonorrhea and chlamydia cultures are pending.  If you develop worsening or new concerning symptoms you can return to the emergency department for re-evaluation.

## 2017-12-11 NOTE — ED Notes (Signed)
Patient transported to Ultrasound 

## 2017-12-11 NOTE — ED Provider Notes (Signed)
[place] EMERGENCY DEPARTMENT Provider Note   CSN: [id number] Arrival date & time: 12/11/17  1826     History   Chief Complaint Chief Complaint  Patient presents with  . Pelvic Pain    HPI Wanda Nixon is a 27 y.o. female who presents emergency department today for pelvic pain.  Patient reports over the last 1 week she has been having pain in her lower back that radiates into her pelvis with associated thick, white discharge.  She reports that it feels as though she is pregnant.  She reports she has had a negative pregnancy test at home however her period has been late as she has not had one since July.  Patient notes she has been taking Tylenol, ibuprofen and using a heating pad for symptoms without relief.  She denies any fever, abdominal pain, nausea/vomiting/diarrhea, vaginal bleeding, urinary frequency, urinary urgency, dysuria, hematuria.  She reports she is sexually active with one female partner including her husband is not use protection.  She denies history of STDs or concerns for STDs at this time.  HPI  Past Medical History:  Diagnosis Date  . Anemia   . Asthma    Last used 08/30/14; weekly inhaler use, used inhaler 2 weeks ago  . Cholestasis of pregnancy in third trimester 07/25/2015  . Depression    celexa off for several months  . Eczema   . Headache(784.0)    migraines  . NST (non-stress test) nonreactive   . SVD (spontaneous vaginal delivery) 07/26/2015    Patient Active Problem List   Diagnosis Date Noted  . Pelvic pain 09/12/2017  . Cholestasis during pregnancy in third trimester 05/19/2017  . SVD (spontaneous vaginal delivery) 05/19/2017  . Postpartum care following vaginal delivery 05/19/2017  . Asthma 02/26/2016  . Anxiety state 02/12/2016  . Migraine, unspecified, without mention of intractable migraine without mention of status migrainosus 10/25/2013    Past Surgical History:  Procedure Laterality Date  . MOUTH SURGERY    .  TYMPANOSTOMY TUBE PLACEMENT    . WISDOM TOOTH EXTRACTION       OB History    Gravida  3   Para  3   Term  3   Preterm  0   AB      Living  3     SAB      TAB      Ectopic      Multiple  0   Live Births  3            Home Medications    Prior to Admission medications   Medication Sig Start Date End Date Taking? Authorizing Provider  acetaminophen (TYLENOL) 500 MG tablet Take 500 mg by mouth every 6 (six) hours as needed for mild pain or moderate pain.    [provider]  albuterol (PROVENTIL HFA;VENTOLIN HFA) 108 (90 Base) MCG/ACT inhaler Inhale 2 puffs into the lungs every 6 (six) hours as needed for wheezing or shortness of breath.    [provider]  citalopram (CELEXA) 20 MG tablet Take 20 mg by mouth daily. 08/25/17   [provider]  cyclobenzaprine (FLEXERIL) 10 MG tablet Take 1 tablet (10 mg total) by mouth 3 (three) times daily as needed for muscle spasms. 12/09/17   Starr Lake, CNM  ibuprofen (ADVIL,MOTRIN) 600 MG tablet Take 1 tablet (600 mg total) by mouth every 6 (six) hours as needed. 05/21/17   Sherlyn Hay, DO  Multiple Vitamins-Minerals (MULTIVITAMIN GUMMIES  ADULT PO) Take 2 each by mouth daily.    [provider]  ondansetron (ZOFRAN ODT) 8 MG disintegrating tablet Take 1 tablet (8 mg total) by mouth every 8 (eight) hours as needed for nausea or vomiting. Patient not taking: Reported on 09/12/2017 04/01/17   Keitha Butte, CNM  prenatal vitamin w/FE, FA (NATACHEW) 29-1 MG CHEW chewable tablet Chew 1 tablet by mouth daily at 12 noon. Patient not taking: Reported on 09/12/2017 05/21/17   Sherlyn Hay, DO  traMADol (ULTRAM) 50 MG tablet Take 1 tablet (50 mg total) by mouth every 6 (six) hours as needed. 09/12/17 09/12/18  Laury Deep, CNM  triamcinolone cream (KENALOG) 0.1 % Apply 1 application topically 2 (two) times daily as needed (rash/itching).     [provider]     Family History Family History  Problem Relation Age of Onset  . Diabetes Maternal Grandmother   . Hypertension Maternal Grandmother   . Stroke Maternal Grandmother   . Hypertension Mother     Social History Social History   Tobacco Use  . Smoking status: Never Smoker  . Smokeless tobacco: Never Used  Substance Use Topics  . Alcohol use: No  . Drug use: No     Allergies   Pulmicort [budesonide]; Tomato; Mushroom extract complex; and Promethazine   Review of Systems Review of Systems  All other systems reviewed and are negative.    Physical Exam Updated Vital Signs BP 116/74 (BP Location: Right Arm)   Pulse 94   Temp 98.6 F (37 C) (Oral)   Resp 18   Wt 59.4 kg   SpO2 100%   BMI 26.45 kg/m   Physical Exam  Constitutional: She appears well-developed and well-nourished.  HENT:  Head: Normocephalic and atraumatic.  Right Ear: External ear normal.  Left Ear: External ear normal.  Nose: Nose normal.  Mouth/Throat: Uvula is midline, oropharynx is clear and moist and mucous membranes are normal. No tonsillar exudate.  Eyes: Pupils are equal, round, and reactive to light. Right eye exhibits no discharge. Left eye exhibits no discharge. No scleral icterus.  Neck: Trachea normal. Neck supple. No spinous process tenderness present. No neck rigidity. Normal range of motion present.  Cardiovascular: Normal rate, regular rhythm and intact distal pulses.  No murmur heard. Pulses:      Radial pulses are 2+ on the right side, and 2+ on the left side.       Dorsalis pedis pulses are 2+ on the right side, and 2+ on the left side.       Posterior tibial pulses are 2+ on the right side, and 2+ on the left side.  No lower extremity swelling or edema. Calves symmetric in size bilaterally.  Pulmonary/Chest: Effort normal and breath sounds normal. She exhibits no tenderness.  Abdominal: Soft. Bowel sounds are normal. She exhibits no distension. There is no tenderness. There is  no rigidity, no rebound, no guarding and no CVA tenderness.  Genitourinary:  Genitourinary Comments: Exam performed by Jillyn Ledger, exam chaperoned Pelvic exam: normal external genitalia without evidence of trauma. VULVA: normal appearing vulva with no masses, tenderness or lesion. VAGINA: normal appearing vagina with normal color and discharge, no lesions. CERVIX: normal appearing cervix without lesions, cervical motion tenderness absent, cervical os closed with out purulent discharge; vaginal discharge - white and thick, Wet prep and DNA probe for chlamydia and GC obtained.   ADNEXA: normal adnexa in size, nontender and no masses UTERUS: uterus is normal size, shape,  consistency and nontender.   Musculoskeletal: She exhibits no edema.  Lymphadenopathy:    She has no cervical adenopathy.  Neurological: She is alert.  Skin: Skin is warm and dry. No rash noted. She is not diaphoretic.  Psychiatric: She has a normal mood and affect.  Nursing note and vitals reviewed.    ED Treatments / Results  Labs (all labs ordered are listed, but only abnormal results are displayed) Labs Reviewed  WET PREP, GENITAL - Abnormal; Notable for the following components:      Result Value   Yeast Wet Prep HPF POC PRESENT (*)    Clue Cells Wet Prep HPF POC PRESENT (*)    WBC, Wet Prep HPF POC MANY (*)    All other components within normal limits  URINALYSIS, ROUTINE W REFLEX MICROSCOPIC - Abnormal; Notable for the following components:   Hgb urine dipstick MODERATE (*)    Leukocytes, UA MODERATE (*)    All other components within normal limits  URINALYSIS, MICROSCOPIC (REFLEX) - Abnormal; Notable for the following components:   Bacteria, UA FEW (*)    All other components within normal limits  PREGNANCY, URINE  GC/CHLAMYDIA PROBE AMP ([place]) NOT AT [facility]    EKG None  Radiology US Transvaginal Non-ob  Result Date: 12/11/2017 CLINICAL DATA:  Low back pain radiating to pelvis EXAM:  TRANSABDOMINAL AND TRANSVAGINAL ULTRASOUND OF PELVIS DOPPLER ULTRASOUND OF OVARIES TECHNIQUE: Both transabdominal and transvaginal ultrasound examinations of the pelvis were performed. Transabdominal technique was performed for global imaging of the pelvis including uterus, ovaries, adnexal regions, and pelvic cul-de-sac. It was necessary to proceed with endovaginal exam following the transabdominal exam to visualize the uterus, endometrium, ovaries and adnexa. Color and duplex Doppler ultrasound was utilized to evaluate blood flow to the ovaries. COMPARISON:  None. FINDINGS: Uterus Measurements: 9.7 x 4.8 x 6.0 cm. No fibroids or other mass visualized. Endometrium Thickness: 16 mm in thickness.  No focal abnormality visualized. Right ovary Measurements: 4.3 x 1.9 x 2.4 cm. Multiple small follicles. Collapsing follicle or cyst measures up to 1.6 cm. Left ovary Measurements: 3.4 x 1.9 x 2.3 cm. Multiple small follicles. Normal appearance. No adnexal mass. Pulsed Doppler evaluation of both ovaries demonstrates normal low-resistance arterial and venous waveforms. Other findings Moderate free fluid in the pelvis. IMPRESSION: Moderate free fluid in the pelvis. Small collapsing cyst or follicle in the right ovary, 1.6 cm. No evidence of ovarian torsion. Electronically Signed   By: Rolm Baptise M.D.   On: 12/11/2017 20:49   US Pelvis Complete  Result Date: 12/11/2017 CLINICAL DATA:  Low back pain radiating to pelvis EXAM: TRANSABDOMINAL AND TRANSVAGINAL ULTRASOUND OF PELVIS DOPPLER ULTRASOUND OF OVARIES TECHNIQUE: Both transabdominal and transvaginal ultrasound examinations of the pelvis were performed. Transabdominal technique was performed for global imaging of the pelvis including uterus, ovaries, adnexal regions, and pelvic cul-de-sac. It was necessary to proceed with endovaginal exam following the transabdominal exam to visualize the uterus, endometrium, ovaries and adnexa. Color and duplex Doppler ultrasound was  utilized to evaluate blood flow to the ovaries. COMPARISON:  None. FINDINGS: Uterus Measurements: 9.7 x 4.8 x 6.0 cm. No fibroids or other mass visualized. Endometrium Thickness: 16 mm in thickness.  No focal abnormality visualized. Right ovary Measurements: 4.3 x 1.9 x 2.4 cm. Multiple small follicles. Collapsing follicle or cyst measures up to 1.6 cm. Left ovary Measurements: 3.4 x 1.9 x 2.3 cm. Multiple small follicles. Normal appearance. No adnexal mass. Pulsed Doppler evaluation of both ovaries demonstrates normal  low-resistance arterial and venous waveforms. Other findings Moderate free fluid in the pelvis. IMPRESSION: Moderate free fluid in the pelvis. Small collapsing cyst or follicle in the right ovary, 1.6 cm. No evidence of ovarian torsion. Electronically Signed   By: Rolm Baptise M.D.   On: 12/11/2017 20:49   [place] Art/ven Flow Abd Pelv Doppler  Result Date: 12/11/2017 CLINICAL DATA:  Low back pain radiating to pelvis EXAM: TRANSABDOMINAL AND TRANSVAGINAL ULTRASOUND OF PELVIS DOPPLER ULTRASOUND OF OVARIES TECHNIQUE: Both transabdominal and transvaginal ultrasound examinations of the pelvis were performed. Transabdominal technique was performed for global imaging of the pelvis including uterus, ovaries, adnexal regions, and pelvic cul-de-sac. It was necessary to proceed with endovaginal exam following the transabdominal exam to visualize the uterus, endometrium, ovaries and adnexa. Color and duplex Doppler ultrasound was utilized to evaluate blood flow to the ovaries. COMPARISON:  None. FINDINGS: Uterus Measurements: 9.7 x 4.8 x 6.0 cm. No fibroids or other mass visualized. Endometrium Thickness: 16 mm in thickness.  No focal abnormality visualized. Right ovary Measurements: 4.3 x 1.9 x 2.4 cm. Multiple small follicles. Collapsing follicle or cyst measures up to 1.6 cm. Left ovary Measurements: 3.4 x 1.9 x 2.3 cm. Multiple small follicles. Normal appearance. No adnexal mass. Pulsed Doppler evaluation of  both ovaries demonstrates normal low-resistance arterial and venous waveforms. Other findings Moderate free fluid in the pelvis. IMPRESSION: Moderate free fluid in the pelvis. Small collapsing cyst or follicle in the right ovary, 1.6 cm. No evidence of ovarian torsion. Electronically Signed   By: Rolm Baptise M.D.   On: 12/11/2017 20:49    Procedures Procedures (including critical care time)  Medications Ordered in ED Medications  HYDROcodone-acetaminophen (NORCO/VICODIN) 5-325 MG per tablet 1 tablet (1 tablet Oral Given 12/11/17 1946)  cephALEXin (KEFLEX) capsule 500 mg (500 mg Oral Given 12/11/17 2158)  metroNIDAZOLE (FLAGYL) tablet 500 mg (500 mg Oral Given 12/11/17 2158)     Initial Impression / Assessment and Plan / ED Course  I have reviewed the triage vital signs and the nursing notes.  Pertinent labs & imaging results that were available during my care of the patient were reviewed by me and considered in my medical decision making (see chart for details).     27 y.o. female with findings consistent with BV, yeast infection and UTI. No fever, N/V/D, or abdominal pain.  Signs are reassuring on presentation.  Patient is without CVA tenderness making concern for pyelonephritis.  No cervical motion tenderness making concern for PID.  Pain controlled in the department.  Will discharge her home on nsaids, flagyl, fluconazole, and keflex.  Does not drink alcohol while taking Flagyl. I advised the patient to follow-up with PCP vs obgyn this week. Specific return precautions discussed. Time was given for all questions to be answered. The patient verbalized understanding and agreement with plan. The patient appears safe for discharge home.  Final Clinical Impressions(s) / ED Diagnoses   Final diagnoses:  Bacterial vaginosis  Cystitis  Vaginal yeast infection    ED Discharge Orders         Ordered    metroNIDAZOLE (FLAGYL) 500 MG tablet  2 times daily     12/11/17 2149    cephALEXin  (KEFLEX) 500 MG capsule  4 times daily     12/11/17 2149    fluconazole (DIFLUCAN) 150 MG tablet   Once     12/11/17 2149    naproxen (NAPROSYN) 375 MG tablet  2 times daily     12/11/17 2205  Jillyn Ledger, PA-C 12/11/17 [place], [place], DO 12/13/17 2259

## 2017-12-27 ENCOUNTER — Inpatient Hospital Stay (HOSPITAL_COMMUNITY)
Admission: AD | Admit: 2017-12-27 | Discharge: 2017-12-27 | Discharge status: Absent without leave | Payer: Medicaid Other | Source: Ambulatory Visit | Attending: Obstetrics and Gynecology | Admitting: Obstetrics and Gynecology

## 2017-12-30 ENCOUNTER — Inpatient Hospital Stay (HOSPITAL_COMMUNITY): Payer: Medicaid Other

## 2017-12-30 ENCOUNTER — Inpatient Hospital Stay (HOSPITAL_COMMUNITY)
Admission: AD | Admit: 2017-12-30 | Discharge: 2017-12-30 | Disposition: A | Discharge status: Home / Self Care | Payer: Medicaid Other | Attending: Obstetrics and Gynecology | Admitting: Obstetrics and Gynecology

## 2017-12-30 ENCOUNTER — Encounter (HOSPITAL_COMMUNITY): Payer: Self-pay | Admitting: *Deleted

## 2017-12-30 DIAGNOSIS — Z3A01 Less than 8 weeks gestation of pregnancy: Secondary | ICD-10-CM | POA: Diagnosis not present

## 2017-12-30 DIAGNOSIS — R109 Unspecified abdominal pain: Secondary | ICD-10-CM | POA: Insufficient documentation

## 2017-12-30 DIAGNOSIS — G43009 Migraine without aura, not intractable, without status migrainosus: Secondary | ICD-10-CM

## 2017-12-30 DIAGNOSIS — B3731 Acute candidiasis of vulva and vagina: Secondary | ICD-10-CM

## 2017-12-30 DIAGNOSIS — O26891 Other specified pregnancy related conditions, first trimester: Secondary | ICD-10-CM | POA: Diagnosis not present

## 2017-12-30 DIAGNOSIS — B373 Candidiasis of vulva and vagina: Secondary | ICD-10-CM

## 2017-12-30 DIAGNOSIS — O3481 Maternal care for other abnormalities of pelvic organs, first trimester: Secondary | ICD-10-CM | POA: Diagnosis not present

## 2017-12-30 DIAGNOSIS — O209 Hemorrhage in early pregnancy, unspecified: Secondary | ICD-10-CM

## 2017-12-30 LAB — URINALYSIS, ROUTINE W REFLEX MICROSCOPIC
Bacteria, UA: NONE SEEN
Bilirubin Urine: NEGATIVE
GLUCOSE, UA: NEGATIVE mg/dL
HGB URINE DIPSTICK: NEGATIVE
KETONES UR: NEGATIVE mg/dL
NITRITE: NEGATIVE
PROTEIN: NEGATIVE mg/dL
Specific Gravity, Urine: 1.028 (ref 1.005–1.030)
pH: 5 (ref 5.0–8.0)

## 2017-12-30 LAB — HCG, QUANTITATIVE, PREGNANCY: hCG, Beta Chain, Quant, S: 20400 m[IU]/mL — ABNORMAL HIGH (ref <5)

## 2017-12-30 LAB — WET PREP, GENITAL
Clue Cells Wet Prep HPF POC: NONE SEEN
SPERM: NONE SEEN
Trich, Wet Prep: NONE SEEN

## 2017-12-30 LAB — POCT PREGNANCY, URINE: Preg Test, Ur: POSITIVE — AB

## 2017-12-30 MED ORDER — FLUCONAZOLE 200 MG PO TABS: 200.0000 mg | ORAL_TABLET | Freq: Every day | ORAL | 0 refills | Status: DC

## 2017-12-30 MED ORDER — TERCONAZOLE 0.8 % VA CREA: 1.0000 | TOPICAL_CREAM | Freq: Every day | VAGINAL | 0 refills | Status: DC

## 2017-12-30 MED ORDER — BUTALBITAL-APAP-CAFFEINE 50-325-40 MG PO TABS
2.0000 | ORAL_TABLET | Freq: Once | ORAL | Status: AC
  Administered 2017-12-30: 2 via ORAL
  Filled 2017-12-30: qty 2

## 2017-12-30 MED ORDER — BUTALBITAL-APAP-CAFFEINE 50-325-40 MG PO TABS: 2.0000 | ORAL_TABLET | Freq: Once | ORAL | Status: DC

## 2017-12-30 MED ORDER — BUTALBITAL-APAP-CAFFEINE 50-325-40 MG PO TABS: 1.0000 | ORAL_TABLET | Freq: Four times a day (QID) | ORAL | 0 refills | Status: DC | PRN

## 2017-12-30 NOTE — Discharge Instructions (Signed)

## 2017-12-30 NOTE — MAU Provider Note (Signed)
History   [id number] @ 8.3 wks in with c/o migraine headache that has been going on for several days.Sge states she is prone to migraines and pregnancy makes them worse.  Also abd cramping with intermittent spotting for several days. No active bleeding now but states does have discharge now.  CSN: [id number]  Arrival date & time 12/30/17  1749   None     Chief Complaint  Patient presents with  . Vaginal Bleeding  . Abdominal Pain  . Possible Pregnancy  . Headache    HPI  Past Medical History:  Diagnosis Date  . Anemia   . Asthma    Last used 08/30/14; weekly inhaler use, used inhaler 2 weeks ago  . Cholestasis of pregnancy in third trimester 07/25/2015  . Depression    celexa off for several months  . Eczema   . Headache(784.0)    migraines  . NST (non-stress test) nonreactive   . SVD (spontaneous vaginal delivery) 07/26/2015    Past Surgical History:  Procedure Laterality Date  . MOUTH SURGERY    . TYMPANOSTOMY TUBE PLACEMENT    . WISDOM TOOTH EXTRACTION      Family History  Problem Relation Age of Onset  . Diabetes Maternal Grandmother   . Hypertension Maternal Grandmother   . Stroke Maternal Grandmother   . Hypertension Mother     Social History   Tobacco Use  . Smoking status: Never Smoker  . Smokeless tobacco: Never Used  Substance Use Topics  . Alcohol use: No  . Drug use: No    OB History    Gravida  4   Para  3   Term  3   Preterm  0   AB      Living  3     SAB      TAB      Ectopic      Multiple  0   Live Births  3           Review of Systems  Constitutional: Negative.   HENT: Negative.   Eyes: Positive for photophobia.  Respiratory: Negative.   Gastrointestinal: Positive for abdominal pain.  Endocrine: Negative.   Genitourinary: Positive for vaginal discharge.  Musculoskeletal: Negative.   Skin: Negative.   Allergic/Immunologic: Negative.   Neurological: Positive for headaches.  Hematological: Negative.    Psychiatric/Behavioral: Negative.     Allergies  Pulmicort [budesonide]; Tomato; Mushroom extract complex; and Promethazine  Home Medications    BP 112/78 (BP Location: Left Arm)   Pulse 79   Temp 98.4 F (36.9 C) (Oral)   Resp 18   Wt 60.4 kg   LMP 11/02/2017   SpO2 100% Comment: ra  BMI 26.88 kg/m   Physical Exam  Constitutional: She is oriented to person, place, and time. She appears well-developed and well-nourished.  HENT:  Head: Normocephalic.  Eyes: Pupils are equal, round, and reactive to light. EOM are normal.  Cardiovascular: Normal rate, regular rhythm and intact distal pulses.  Pulmonary/Chest: Effort normal and breath sounds normal.  Abdominal: Soft. Normal appearance. Bowel sounds are increased.  Genitourinary: Vagina normal and uterus normal.  Neurological: She is alert and oriented to person, place, and time.  Skin: Skin is warm and dry.  Psychiatric: She has a normal mood and affect. Her behavior is normal.    MAU Course  Procedures (including critical care time)  Labs Reviewed  URINALYSIS, ROUTINE W REFLEX MICROSCOPIC - Abnormal; Notable for the following components:  Result Value   Leukocytes, UA SMALL (*)    All other components within normal limits  POCT PREGNANCY, URINE - Abnormal; Notable for the following components:   Preg Test, Ur POSITIVE (*)    All other components within normal limits  WET PREP, GENITAL  HCG, QUANTITATIVE, PREGNANCY  GC/CHLAMYDIA PROBE AMP ([place]) NOT AT [facility]   No results found. Results for orders placed or performed during the hospital encounter of 12/30/17 (from the past 24 hour(s))  Urinalysis, Routine w reflex microscopic     Status: Abnormal   Collection Time: 12/30/17  6:09 PM  Result Value Ref Range   Color, Urine YELLOW YELLOW   APPearance CLEAR CLEAR   Specific Gravity, Urine 1.028 1.005 - 1.030   pH 5.0 5.0 - 8.0   Glucose, UA NEGATIVE NEGATIVE mg/dL   Hgb urine dipstick NEGATIVE NEGATIVE    Bilirubin Urine NEGATIVE NEGATIVE   Ketones, ur NEGATIVE NEGATIVE mg/dL   Protein, ur NEGATIVE NEGATIVE mg/dL   Nitrite NEGATIVE NEGATIVE   Leukocytes, UA SMALL (A) NEGATIVE   RBC / HPF 0-5 0 - 5 RBC/hpf   WBC, UA 0-5 0 - 5 WBC/hpf   Bacteria, UA NONE SEEN NONE SEEN   Squamous Epithelial / LPF 0-5 0 - 5   Mucus PRESENT   Pregnancy, urine POC     Status: Abnormal   Collection Time: 12/30/17  6:16 PM  Result Value Ref Range   Preg Test, Ur POSITIVE (A) NEGATIVE  Wet prep, genital     Status: Abnormal   Collection Time: 12/30/17  6:21 PM  Result Value Ref Range   Yeast Wet Prep HPF POC PRESENT (A) NONE SEEN   Trich, Wet Prep NONE SEEN NONE SEEN   Clue Cells Wet Prep HPF POC NONE SEEN NONE SEEN   WBC, Wet Prep HPF POC FEW (A) NONE SEEN   Sperm NONE SEEN   hCG, quantitative, pregnancy     Status: Abnormal   Collection Time: 12/30/17  6:38 PM  Result Value Ref Range   hCG, Beta Chain, Quant, S 20,400 (H) <5 mIU/mL    1. Abdominal pain in pregnancy, first trimester   2. Vaginal bleeding in pregnancy, first trimester   3. Migraine without aura and without status migrainosus, not intractable       MDM  VSS,  sm amt yellow vag discharge present with exam.  Ultrasound shows viable IUP at 8 weeks and 2 days.  Wet prep shows yeast.  Plan of care discussed with Dr. Willis Modena patient to be discharged with Terazol 3 and prescription for Fioricet for migraines.  To follow-up in office

## 2017-12-30 NOTE — MAU Note (Addendum)
+  N/V Tolerates water. Unable to keep anything else down.   +migraines x3 days  +vaginal bleeding When wipes in the morning Pink in color  +HPT x2 LMP 11/02/17  +vaginal discharge ?yeast infection Thick white

## 2018-01-02 LAB — GC/CHLAMYDIA PROBE AMP (~~LOC~~) NOT AT ARMC
Chlamydia: NEGATIVE
Neisseria Gonorrhea: NEGATIVE

## 2018-01-06 ENCOUNTER — Encounter (HOSPITAL_COMMUNITY): Payer: Self-pay | Admitting: *Deleted

## 2018-01-06 ENCOUNTER — Inpatient Hospital Stay (HOSPITAL_COMMUNITY)
Admission: AD | Admit: 2018-01-06 | Discharge: 2018-01-06 | Disposition: A | Discharge status: Home / Self Care | Payer: Medicaid Other | Source: Ambulatory Visit | Attending: Obstetrics and Gynecology | Admitting: Obstetrics and Gynecology

## 2018-01-06 DIAGNOSIS — F329 Major depressive disorder, single episode, unspecified: Secondary | ICD-10-CM | POA: Insufficient documentation

## 2018-01-06 DIAGNOSIS — O99341 Other mental disorders complicating pregnancy, first trimester: Secondary | ICD-10-CM | POA: Insufficient documentation

## 2018-01-06 DIAGNOSIS — O26891 Other specified pregnancy related conditions, first trimester: Secondary | ICD-10-CM | POA: Insufficient documentation

## 2018-01-06 DIAGNOSIS — Z79899 Other long term (current) drug therapy: Secondary | ICD-10-CM | POA: Insufficient documentation

## 2018-01-06 DIAGNOSIS — Z3A01 Less than 8 weeks gestation of pregnancy: Secondary | ICD-10-CM | POA: Diagnosis not present

## 2018-01-06 DIAGNOSIS — O219 Vomiting of pregnancy, unspecified: Secondary | ICD-10-CM | POA: Insufficient documentation

## 2018-01-06 DIAGNOSIS — R1110 Vomiting, unspecified: Secondary | ICD-10-CM | POA: Diagnosis present

## 2018-01-06 DIAGNOSIS — G44209 Tension-type headache, unspecified, not intractable: Secondary | ICD-10-CM | POA: Diagnosis not present

## 2018-01-06 DIAGNOSIS — O99511 Diseases of the respiratory system complicating pregnancy, first trimester: Secondary | ICD-10-CM | POA: Diagnosis not present

## 2018-01-06 DIAGNOSIS — J45909 Unspecified asthma, uncomplicated: Secondary | ICD-10-CM | POA: Diagnosis not present

## 2018-01-06 LAB — URINALYSIS, ROUTINE W REFLEX MICROSCOPIC
Bilirubin Urine: NEGATIVE
Glucose, UA: NEGATIVE mg/dL
HGB URINE DIPSTICK: NEGATIVE
KETONES UR: 5 mg/dL — AB
NITRITE: NEGATIVE
PROTEIN: NEGATIVE mg/dL
Specific Gravity, Urine: 1.026 (ref 1.005–1.030)
pH: 5 (ref 5.0–8.0)

## 2018-01-06 LAB — CBC
HCT: 35.6 % — ABNORMAL LOW (ref 36.0–46.0)
Hemoglobin: 12.1 g/dL (ref 12.0–15.0)
MCH: 27.6 pg (ref 26.0–34.0)
MCHC: 34 g/dL (ref 30.0–36.0)
MCV: 81.1 fL (ref 78.0–100.0)
PLATELETS: 300 10*3/uL (ref 150–400)
RBC: 4.39 MIL/uL (ref 3.87–5.11)
RDW: 14.5 % (ref 11.5–15.5)
WBC: 5.2 10*3/uL (ref 4.0–10.5)

## 2018-01-06 LAB — COMPREHENSIVE METABOLIC PANEL
ALT: 11 U/L (ref 0–44)
AST: 15 U/L (ref 15–41)
Albumin: 4.4 g/dL (ref 3.5–5.0)
Alkaline Phosphatase: 47 U/L (ref 38–126)
Anion gap: 9 (ref 5–15)
BUN: 10 mg/dL (ref 6–20)
CALCIUM: 8.7 mg/dL — AB (ref 8.9–10.3)
CO2: 22 mmol/L (ref 22–32)
CREATININE: 0.5 mg/dL (ref 0.44–1.00)
Chloride: 103 mmol/L (ref 98–111)
GFR calc Af Amer: >60 mL/min (ref >60)
GFR calc non Af Amer: >60 mL/min (ref >60)
Glucose, Bld: 82 mg/dL (ref 70–99)
Potassium: 3.5 mmol/L (ref 3.5–5.1)
SODIUM: 134 mmol/L — AB (ref 135–145)
TOTAL PROTEIN: 8 g/dL (ref 6.5–8.1)
Total Bilirubin: 0.5 mg/dL (ref 0.3–1.2)

## 2018-01-06 MED ORDER — ONDANSETRON 8 MG PO TBDP: 8.0000 mg | ORAL_TABLET | Freq: Three times a day (TID) | ORAL | 1 refills | Status: DC | PRN

## 2018-01-06 MED ORDER — METOCLOPRAMIDE HCL 5 MG/ML IJ SOLN: 10.0000 mg | Freq: Once | INTRAMUSCULAR | Status: DC

## 2018-01-06 MED ORDER — DEXAMETHASONE SODIUM PHOSPHATE 10 MG/ML IJ SOLN
10.0000 mg | Freq: Once | INTRAMUSCULAR | Status: AC
  Administered 2018-01-06: 10 mg via INTRAMUSCULAR
  Filled 2018-01-06: qty 1

## 2018-01-06 MED ORDER — BUTALBITAL-APAP-CAFFEINE 50-325-40 MG PO TABS: 1.0000 | ORAL_TABLET | Freq: Four times a day (QID) | ORAL | 0 refills | Status: DC | PRN

## 2018-01-06 MED ORDER — DIPHENHYDRAMINE HCL 50 MG/ML IJ SOLN: 25.0000 mg | Freq: Once | INTRAMUSCULAR | Status: DC

## 2018-01-06 MED ORDER — PROMETHAZINE HCL 25 MG RE SUPP: 25.0000 mg | Freq: Four times a day (QID) | RECTAL | 0 refills | Status: DC | PRN

## 2018-01-06 MED ORDER — DIPHENHYDRAMINE HCL 50 MG/ML IJ SOLN
25.0000 mg | Freq: Once | INTRAMUSCULAR | Status: AC
  Administered 2018-01-06: 25 mg via INTRAMUSCULAR
  Filled 2018-01-06: qty 1

## 2018-01-06 MED ORDER — DEXAMETHASONE SODIUM PHOSPHATE 10 MG/ML IJ SOLN: 10.0000 mg | Freq: Once | INTRAMUSCULAR | Status: DC

## 2018-01-06 MED ORDER — CITALOPRAM HYDROBROMIDE 20 MG PO TABS: 20.0000 mg | ORAL_TABLET | Freq: Every day | ORAL | 1 refills | Status: DC

## 2018-01-06 MED ORDER — METOCLOPRAMIDE HCL 5 MG/ML IJ SOLN
10.0000 mg | Freq: Once | INTRAMUSCULAR | Status: AC
  Administered 2018-01-06: 10 mg via INTRAMUSCULAR
  Filled 2018-01-06: qty 2

## 2018-01-06 MED ORDER — LACTATED RINGERS IV BOLUS: 500.0000 mL | Freq: Once | INTRAVENOUS | Status: DC

## 2018-01-06 MED ORDER — BUTALBITAL-APAP-CAFFEINE 50-325-40 MG PO TABS: 2.0000 | ORAL_TABLET | Freq: Once | ORAL | Status: DC

## 2018-01-06 NOTE — Discharge Instructions (Signed)
Eating Plan for Hyperemesis Gravidarum °Hyperemesis gravidarum is a severe form of morning sickness. Because this condition causes severe nausea and vomiting, it can lead to dehydration, malnutrition, and weight loss. One way to lessen the symptoms of nausea and vomiting is to follow the eating plan for hyperemesis gravidarum. It is often used along with prescribed medicines to control your symptoms. °What can I do to relieve my symptoms? °Listen to your body. Everyone is different and has different preferences. Find what works best for you. Take any of the following actions that are helpful to you: °· Eat and drink slowly. °· Eat 5-6 small meals daily instead of 3 large meals. °· Eat crackers before you get out of bed in the morning. °· Try having a snack in the middle of the night. °· Starchy foods are usually tolerated well. Examples include cereal, toast, bread, potatoes, pasta, rice, and pretzels. °· Ginger may help with nausea. Add ¼ tsp ground ginger to hot tea or choose ginger tea. °· Try drinking 100% fruit juice or an electrolyte drink. An electrolyte drink contains sodium, potassium, and chloride. °· Continue to take your prenatal vitamins as told by your health care provider. If you are having trouble taking your prenatal vitamins, talk with your health care provider about different options. °· Include at least 1 serving of protein with your meals and snacks. Protein options include meats or poultry, beans, nuts, eggs, and yogurt. Try eating a protein-rich snack before bed. Examples of these snacks include cheese and crackers or half of a peanut butter or turkey sandwich. °· Consider eliminating foods that trigger your symptoms. These may include spicy foods, coffee, high-fat foods, very sweet foods, and acidic foods. °· Try meals that have more protein combined with bland, salty, lower-fat, and dry foods, such as nuts, seeds, pretzels, crackers, and cereal. °· Talk with your healthcare provider about  starting a supplement of vitamin B6. °· Have fluids that are cold, clear, and carbonated or sour. Examples include lemonade, ginger ale, lemon-lime soda, ice water, and sparkling water. °· Try lemon or mint tea. °· Try brushing your teeth or using a mouth rinse after meals. ° °What should I avoid to reduce my symptoms? °Avoiding some of the following things may help reduce your symptoms. °· Foods with strong smells. Try eating meals in well-ventilated areas that are free of odors. °· Drinking water or other beverages with meals. Try not to drink anything during the 30 minutes before and after your meals. °· Drinking more than 1 cup of fluid at a time. Sometimes using a straw helps. °· Fried or high-fat foods, such as butter and cream sauces. °· Spicy foods. °· Skipping meals as best as you can. Nausea can be more intense on an empty stomach. If you cannot tolerate food at that time, do not force it. Try sucking on ice chips or other frozen items, and make up for missed calories later. °· Lying down within 2 hours after eating. °· Environmental triggers. These may include smoky rooms, closed spaces, rooms with strong smells, warm or humid places, overly loud and noisy rooms, and rooms with motion or flickering lights. °· Quick and sudden changes in your movement. ° °This information is not intended to replace advice given to you by your health care provider. Make sure you discuss any questions you have with your health care provider. °Document Released: 02/13/2007 Document Revised: 12/16/2015 Document Reviewed: 11/17/2015 °Elsevier Interactive Patient Education © 2018 [employer] ° °

## 2018-01-06 NOTE — MAU Provider Note (Addendum)
Patient Wanda Nixon is a 27 y.o. [phone] At [redacted]w[redacted]d here with complaints of migraine, face pain and nausea. She states she is very overwhelmed with this pregnancy; she does not think she can cope.  She has "not been able to keep anything down" for three days.  History     CSN: [id number]  Arrival date and time: 01/06/18 1258   None     Chief Complaint  Patient presents with  . Headache  . Emesis   Headache   This is a new problem. The current episode started today. The problem occurs constantly. The pain is located in the frontal region. The pain radiates to the face. The pain quality is similar to prior headaches. The quality of the pain is described as aching. The pain is at a severity of 10/10. Associated symptoms include vomiting. Pertinent negatives include no blurred vision, loss of balance or visual change. The symptoms are aggravated by bright light. She has tried darkened room for the symptoms.    OB History    Gravida  4   Para  3   Term  3   Preterm  0   AB      Living  3     SAB      TAB      Ectopic      Multiple  0   Live Births  3           Past Medical History:  Diagnosis Date  . Anemia   . Asthma    Last used 08/30/14; weekly inhaler use, used inhaler 2 weeks ago  . Cholestasis of pregnancy in third trimester 07/25/2015  . Depression    celexa off for several months  . Eczema   . Headache(784.0)    migraines  . NST (non-stress test) nonreactive   . SVD (spontaneous vaginal delivery) 07/26/2015    Past Surgical History:  Procedure Laterality Date  . MOUTH SURGERY    . TYMPANOSTOMY TUBE PLACEMENT    . WISDOM TOOTH EXTRACTION      Family History  Problem Relation Age of Onset  . Diabetes Maternal Grandmother   . Hypertension Maternal Grandmother   . Stroke Maternal Grandmother   . Hypertension Mother     Social History   Tobacco Use  . Smoking status: Never Smoker  . Smokeless tobacco: Never Used  Substance Use Topics  .  Alcohol use: No  . Drug use: No    Allergies:  Allergies  Allergen Reactions  . Pulmicort [Budesonide] Palpitations  . Tomato Hives and Itching  . Mushroom Extract Complex Hives and Itching  . Promethazine Rash and Other (See Comments)    Reaction:  Dizziness     Medications Prior to Admission  Medication Sig Dispense Refill Last Dose  . acetaminophen (TYLENOL) 500 MG tablet Take 500 mg by mouth every 6 (six) hours as needed for mild pain or moderate pain.   09/11/2017 at Unknown time  . albuterol (PROVENTIL HFA;VENTOLIN HFA) 108 (90 Base) MCG/ACT inhaler Inhale 2 puffs into the lungs every 6 (six) hours as needed for wheezing or shortness of breath.   Past Month at Unknown time  . butalbital-acetaminophen-caffeine (FIORICET, ESGIC) 50-325-40 MG tablet Take 1-2 tablets by mouth every 6 (six) hours as needed for headache. 20 tablet 0   . cephALEXin (KEFLEX) 500 MG capsule Take 1 capsule (500 mg total) by mouth 4 (four) times daily. 20 capsule 0   . citalopram (CELEXA) 20 MG tablet  Take 20 mg by mouth daily.  1 09/12/2017 at Unknown time  . cyclobenzaprine (FLEXERIL) 10 MG tablet Take 1 tablet (10 mg total) by mouth 3 (three) times daily as needed for muscle spasms. 30 tablet 0   . metroNIDAZOLE (FLAGYL) 500 MG tablet Take 1 tablet (500 mg total) by mouth 2 (two) times daily. 14 tablet 0   . Multiple Vitamins-Minerals (MULTIVITAMIN GUMMIES ADULT PO) Take 2 each by mouth daily.   09/11/2017 at Unknown time  . ondansetron (ZOFRAN ODT) 8 MG disintegrating tablet Take 1 tablet (8 mg total) by mouth every 8 (eight) hours as needed for nausea or vomiting. (Patient not taking: Reported on 09/12/2017) 20 tablet 0 Not Taking at Unknown time  . prenatal vitamin w/FE, FA (NATACHEW) 29-1 MG CHEW chewable tablet Chew 1 tablet by mouth daily at 12 noon. (Patient not taking: Reported on 09/12/2017) 60 tablet 4 Not Taking at Unknown time  . terconazole (TERAZOL 3) 0.8 % vaginal cream Place 1 applicator vaginally  at bedtime. 20 g 0   . triamcinolone cream (KENALOG) 0.1 % Apply 1 application topically 2 (two) times daily as needed (rash/itching).    Past Week at Unknown time    Review of Systems  Constitutional: Negative.   HENT: Negative.   Eyes: Negative for blurred vision.  Respiratory: Negative.   Cardiovascular: Negative.   Gastrointestinal: Positive for vomiting.  Genitourinary: Negative.   Musculoskeletal: Negative.   Neurological: Positive for headaches. Negative for loss of balance.   Physical Exam   Blood pressure 126/81, pulse 87, temperature 98 F (36.7 C), temperature source Oral, resp. rate 16, weight 60.4 kg, last menstrual period 11/02/2017, SpO2 100 %, unknown if currently breastfeeding.  Physical Exam  Constitutional: She appears well-developed.  HENT:  Head: Normocephalic.  Eyes: Pupils are equal, round, and reactive to light.  Neck: Normal range of motion.  GI: Soft.  Neurological: She is alert.  Skin: Skin is warm and dry.    MAU Course  Procedures  MDM -headache cocktail (decadron, benadryl, reglan). HA is now a 4/10.  -patient has tolerated crackers.  -Feeling well enough to go home; Discussed case with Dr. Melba Coon, who agrees.  -CBC, CMP normal; UA normal.  -FHR 165 by [place]  Assessment and Plan   1. Acute non intractable tension-type headache    2. Patient stable for discharge with RX for phenergan suppositories and Zofran.    3. Will send message to HA clinic at [facility] for appt.   4. Patient will keep appt with Dr. Melba Coon in three days.   5. Return precautions reviewed; all questions answered.   Mervyn Skeeters [place] 01/06/2018, 2:57 PM

## 2018-01-06 NOTE — MAU Note (Signed)
+  migraine x3 days States the Fioricet she was given is not working  [employer] States is unable to keep anything to eat or drink down for the past 3 days  +toothache States was seen at the dentist but still having problems

## 2018-01-24 DIAGNOSIS — F32A Depression, unspecified: Secondary | ICD-10-CM | POA: Insufficient documentation

## 2018-01-24 DIAGNOSIS — L309 Dermatitis, unspecified: Secondary | ICD-10-CM | POA: Insufficient documentation

## 2018-01-24 DIAGNOSIS — K831 Obstruction of bile duct: Secondary | ICD-10-CM | POA: Insufficient documentation

## 2018-01-29 ENCOUNTER — Other Ambulatory Visit: Payer: Self-pay | Admitting: Student

## 2018-01-30 DIAGNOSIS — L309 Dermatitis, unspecified: Secondary | ICD-10-CM | POA: Diagnosis not present

## 2018-01-30 DIAGNOSIS — J45909 Unspecified asthma, uncomplicated: Secondary | ICD-10-CM | POA: Diagnosis not present

## 2018-01-30 DIAGNOSIS — F411 Generalized anxiety disorder: Secondary | ICD-10-CM | POA: Diagnosis not present

## 2018-01-30 DIAGNOSIS — L299 Pruritus, unspecified: Secondary | ICD-10-CM | POA: Diagnosis not present

## 2018-01-30 DIAGNOSIS — O26619 Liver and biliary tract disorders in pregnancy, unspecified trimester: Secondary | ICD-10-CM | POA: Diagnosis not present

## 2018-01-30 DIAGNOSIS — O210 Mild hyperemesis gravidarum: Secondary | ICD-10-CM | POA: Diagnosis not present

## 2018-01-30 DIAGNOSIS — R102 Pelvic and perineal pain: Secondary | ICD-10-CM | POA: Diagnosis not present

## 2018-01-30 DIAGNOSIS — N925 Other specified irregular menstruation: Secondary | ICD-10-CM | POA: Diagnosis not present

## 2018-01-31 ENCOUNTER — Encounter (HOSPITAL_COMMUNITY): Payer: Self-pay | Admitting: *Deleted

## 2018-01-31 ENCOUNTER — Inpatient Hospital Stay (HOSPITAL_COMMUNITY)
Admission: AD | Admit: 2018-01-31 | Discharge: 2018-01-31 | Disposition: A | Discharge status: Home / Self Care | Payer: Medicaid Other | Source: Ambulatory Visit | Attending: Obstetrics and Gynecology | Admitting: Obstetrics and Gynecology

## 2018-01-31 ENCOUNTER — Other Ambulatory Visit: Payer: Self-pay

## 2018-01-31 DIAGNOSIS — F411 Generalized anxiety disorder: Secondary | ICD-10-CM | POA: Insufficient documentation

## 2018-01-31 DIAGNOSIS — O26891 Other specified pregnancy related conditions, first trimester: Secondary | ICD-10-CM | POA: Diagnosis not present

## 2018-01-31 DIAGNOSIS — Z3A10 10 weeks gestation of pregnancy: Secondary | ICD-10-CM | POA: Insufficient documentation

## 2018-01-31 DIAGNOSIS — L299 Pruritus, unspecified: Secondary | ICD-10-CM

## 2018-01-31 DIAGNOSIS — O99341 Other mental disorders complicating pregnancy, first trimester: Secondary | ICD-10-CM | POA: Diagnosis not present

## 2018-01-31 DIAGNOSIS — F329 Major depressive disorder, single episode, unspecified: Secondary | ICD-10-CM | POA: Insufficient documentation

## 2018-01-31 DIAGNOSIS — O26899 Other specified pregnancy related conditions, unspecified trimester: Secondary | ICD-10-CM

## 2018-01-31 DIAGNOSIS — R109 Unspecified abdominal pain: Secondary | ICD-10-CM | POA: Diagnosis not present

## 2018-01-31 LAB — COMPREHENSIVE METABOLIC PANEL
ALBUMIN: 4.2 g/dL (ref 3.5–5.0)
ALT: 13 U/L (ref 0–44)
ANION GAP: 9 (ref 5–15)
AST: 17 U/L (ref 15–41)
Alkaline Phosphatase: 46 U/L (ref 38–126)
BILIRUBIN TOTAL: 0.8 mg/dL (ref 0.3–1.2)
BUN: 8 mg/dL (ref 6–20)
CO2: 22 mmol/L (ref 22–32)
Calcium: 8.7 mg/dL — ABNORMAL LOW (ref 8.9–10.3)
Chloride: 102 mmol/L (ref 98–111)
Creatinine, Ser: 0.49 mg/dL (ref 0.44–1.00)
GFR calc non Af Amer: >60 mL/min (ref >60)
GLUCOSE: 92 mg/dL (ref 70–99)
POTASSIUM: 3.5 mmol/L (ref 3.5–5.1)
SODIUM: 133 mmol/L — AB (ref 135–145)
TOTAL PROTEIN: 8.1 g/dL (ref 6.5–8.1)

## 2018-01-31 LAB — CBC WITH DIFFERENTIAL/PLATELET
BASOS PCT: 0 %
Basophils Absolute: 0 10*3/uL (ref 0.0–0.1)
EOS ABS: 0.2 10*3/uL (ref 0.0–0.7)
Eosinophils Relative: 3 %
HEMATOCRIT: 36.6 % (ref 36.0–46.0)
Hemoglobin: 12.5 g/dL (ref 12.0–15.0)
Lymphocytes Relative: 17 %
Lymphs Abs: 0.9 10*3/uL (ref 0.7–4.0)
MCH: 27.6 pg (ref 26.0–34.0)
MCHC: 34.2 g/dL (ref 30.0–36.0)
MCV: 80.8 fL (ref 78.0–100.0)
MONO ABS: 0.2 10*3/uL (ref 0.1–1.0)
Monocytes Relative: 4 %
Neutro Abs: 3.7 10*3/uL (ref 1.7–7.7)
Neutrophils Relative %: 76 %
Platelets: 298 10*3/uL (ref 150–400)
RBC: 4.53 MIL/uL (ref 3.87–5.11)
RDW: 14.9 % (ref 11.5–15.5)
WBC: 5 10*3/uL (ref 4.0–10.5)

## 2018-01-31 LAB — URINALYSIS, ROUTINE W REFLEX MICROSCOPIC
BILIRUBIN URINE: NEGATIVE
Glucose, UA: NEGATIVE mg/dL
Hgb urine dipstick: NEGATIVE
KETONES UR: NEGATIVE mg/dL
Nitrite: NEGATIVE
Protein, ur: NEGATIVE mg/dL
Specific Gravity, Urine: 1.027 (ref 1.005–1.030)
pH: 6 (ref 5.0–8.0)

## 2018-01-31 MED ORDER — IBUPROFEN 800 MG PO TABS
800.0000 mg | ORAL_TABLET | Freq: Once | ORAL | Status: AC
  Administered 2018-01-31: 800 mg via ORAL
  Filled 2018-01-31: qty 1

## 2018-01-31 MED ORDER — HYDROXYZINE PAMOATE 25 MG PO CAPS: 25.0000 mg | ORAL_CAPSULE | Freq: Three times a day (TID) | ORAL | 2 refills | Status: DC | PRN

## 2018-01-31 MED ORDER — DIPHENHYDRAMINE HCL 25 MG PO CAPS
50.0000 mg | ORAL_CAPSULE | Freq: Once | ORAL | Status: AC
  Administered 2018-01-31: 50 mg via ORAL
  Filled 2018-01-31: qty 2

## 2018-01-31 NOTE — Progress Notes (Signed)
CSW met with patient via bedside due to receiving consult for crisis counseling. Patient states she is only about [redacted] weeks pregnant and has been feeling sick/ having additional anxiety due to not having a planned pregnancy. Patient has 3 other children and is currently the primary financial support for her family. Patient partner is currently unemployed. 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 Patient states she has been feeling sick and is unable to take of work at this time- states apartment complex is not very understanding regarding calling out of work. Patient states she has been feeling more anxious and "emotional"- CSW informed patient feeling more emotional may be from hormones but to seek help if anxiety/ emotions became too much. CSW offered patient resources to [facility] of the [place] [facility] [place] for outpatient counseling if needed- patient declined resources. CSW will place information on AVS in the event patient changes her mind.   Please reconsult CSW for any other needs.   Kingsley Spittle, [place]  [phone]
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
## 2018-01-31 NOTE — MAU Note (Signed)
Assistance needed from car.  In too much pain to walk. Having severe pain and cramping in belly and back. Been going on for a couple days.  Got really bad last night, didn't get any sleep.  Thinks she has cholestasis again, only happens when she is pregnant.  Is broke out itching and is miserable.

## 2018-01-31 NOTE — Discharge Instructions (Signed)

## 2018-02-01 LAB — BILE ACIDS, TOTAL: Bile Acids Total: 4.3 umol/L (ref 0.0–10.0)

## 2018-02-09 ENCOUNTER — Institutional Professional Consult (permissible substitution): Payer: Medicaid Other | Admitting: Physician Assistant

## 2018-06-12 ENCOUNTER — Ambulatory Visit: Payer: Medicaid Other | Admitting: Family Medicine

## 2018-06-19 ENCOUNTER — Ambulatory Visit: Payer: Medicaid Other | Admitting: Family Medicine

## 2018-06-19 ENCOUNTER — Encounter: Payer: Self-pay | Admitting: Family Medicine

## 2018-06-19 ENCOUNTER — Other Ambulatory Visit: Payer: Self-pay

## 2018-06-19 VITALS — BP 122/82 | HR 84 | Temp 98.7°F | Wt 134.5 lb

## 2018-06-19 DIAGNOSIS — F411 Generalized anxiety disorder: Secondary | ICD-10-CM

## 2018-06-19 MED ORDER — TRIAMCINOLONE ACETONIDE 0.1 % EX CREA: 1.0000 "application " | TOPICAL_CREAM | Freq: Two times a day (BID) | CUTANEOUS | 1 refills | Status: DC | PRN

## 2018-06-19 MED ORDER — CITALOPRAM HYDROBROMIDE 20 MG PO TABS: 20.0000 mg | ORAL_TABLET | Freq: Every day | ORAL | 1 refills | Status: DC

## 2018-06-19 NOTE — Patient Instructions (Signed)
It was a pleasure to see you today! Thank you for choosing [facility] for your primary care. Wanda Nixon was seen for new patient.   Our plans for today were:  Please keep taking your citalopram.   Find a therapist that you like.   Best,  Dr. Lindell Noe
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
## 2018-06-21 DIAGNOSIS — F411 Generalized anxiety disorder: Secondary | ICD-10-CM | POA: Insufficient documentation

## 2018-06-21 NOTE — Assessment & Plan Note (Signed)
Patient has previously done well on citalopram.  She restarted this yesterday with an old refill.  We will continue this plan and titrate up as needed.  We discussed that counseling should be extraordinarily helpful for her, she will find a counselor and make an appointment before we see each other again in 1 month.

## 2018-06-27 ENCOUNTER — Ambulatory Visit: Payer: Medicaid Other | Admitting: Family Medicine

## 2018-07-17 ENCOUNTER — Ambulatory Visit: Payer: Medicaid Other | Admitting: Family Medicine

## 2018-07-31 ENCOUNTER — Other Ambulatory Visit: Payer: Self-pay

## 2018-07-31 ENCOUNTER — Telehealth: Payer: Self-pay | Admitting: Family Medicine

## 2018-07-31 ENCOUNTER — Inpatient Hospital Stay (HOSPITAL_COMMUNITY)
Admission: AD | Admit: 2018-07-31 | Discharge: 2018-07-31 | Disposition: A | Discharge status: Home / Self Care | Payer: Medicaid Other | Attending: Obstetrics & Gynecology | Admitting: Obstetrics & Gynecology

## 2018-07-31 ENCOUNTER — Encounter (HOSPITAL_COMMUNITY): Payer: Self-pay

## 2018-07-31 DIAGNOSIS — Z3A01 Less than 8 weeks gestation of pregnancy: Secondary | ICD-10-CM | POA: Insufficient documentation

## 2018-07-31 DIAGNOSIS — O26891 Other specified pregnancy related conditions, first trimester: Secondary | ICD-10-CM | POA: Diagnosis not present

## 2018-07-31 DIAGNOSIS — R51 Headache: Secondary | ICD-10-CM | POA: Diagnosis not present

## 2018-07-31 LAB — URINALYSIS, ROUTINE W REFLEX MICROSCOPIC
Bilirubin Urine: NEGATIVE
Glucose, UA: NEGATIVE mg/dL
Hgb urine dipstick: NEGATIVE
Ketones, ur: NEGATIVE mg/dL
LEUKOCYTE UA: NEGATIVE
NITRITE: NEGATIVE
PROTEIN: NEGATIVE mg/dL
SPECIFIC GRAVITY, URINE: 1.021 (ref 1.005–1.030)
pH: 9 — ABNORMAL HIGH (ref 5.0–8.0)

## 2018-07-31 LAB — POCT PREGNANCY, URINE: PREG TEST UR: POSITIVE — AB

## 2018-07-31 NOTE — MAU Note (Signed)
Pt has had a headache for 3 days and n/v. Called the office and was sent to see if she was dehydrated. Pain is 10/10.

## 2018-07-31 NOTE — Telephone Encounter (Signed)
Patient is calling to get prescribed something for fatigue, and nausea. Patient stated that she took pregnancy at home was was positive. Please call patient back.

## 2018-08-01 NOTE — Telephone Encounter (Signed)
Pt scheduled for a well visit Friday AM.

## 2018-08-01 NOTE — Telephone Encounter (Signed)
Please call patient. This is something we should see her for (I asked Eniola if this was ok, she says yes). We need to draw labs related to pregnancy, and discuss pregnancy care and options with her. Please schedule with me or anyone on the well care schedule.

## 2018-08-03 ENCOUNTER — Other Ambulatory Visit: Payer: Self-pay

## 2018-08-03 ENCOUNTER — Ambulatory Visit (INDEPENDENT_AMBULATORY_CARE_PROVIDER_SITE_OTHER): Payer: Medicaid Other | Admitting: Family Medicine

## 2018-08-03 VITALS — BP 102/62 | HR 85 | Temp 98.9°F | Wt 137.1 lb

## 2018-08-03 DIAGNOSIS — Z3481 Encounter for supervision of other normal pregnancy, first trimester: Secondary | ICD-10-CM | POA: Diagnosis not present

## 2018-08-03 DIAGNOSIS — O219 Vomiting of pregnancy, unspecified: Secondary | ICD-10-CM | POA: Diagnosis not present

## 2018-08-03 DIAGNOSIS — Z3A01 Less than 8 weeks gestation of pregnancy: Secondary | ICD-10-CM | POA: Diagnosis not present

## 2018-08-03 DIAGNOSIS — Z3490 Encounter for supervision of normal pregnancy, unspecified, unspecified trimester: Secondary | ICD-10-CM | POA: Insufficient documentation

## 2018-08-03 LAB — POCT URINALYSIS DIP (MANUAL ENTRY)
Bilirubin, UA: NEGATIVE
Blood, UA: NEGATIVE
Glucose, UA: NEGATIVE mg/dL
Ketones, POC UA: NEGATIVE mg/dL
Nitrite, UA: NEGATIVE
Protein Ur, POC: NEGATIVE mg/dL
Spec Grav, UA: 1.02 (ref 1.010–1.025)
Urobilinogen, UA: 0.2 E.U./dL
pH, UA: 7.5 (ref 5.0–8.0)

## 2018-08-03 LAB — POCT UA - MICROSCOPIC ONLY

## 2018-08-03 LAB — POCT URINE PREGNANCY: Preg Test, Ur: POSITIVE — AB

## 2018-08-03 MED ORDER — PRENATAL VITAMIN 27-0.8 MG PO TABS: 1.0000 | ORAL_TABLET | Freq: Every day | ORAL | 1 refills | Status: DC

## 2018-08-03 MED ORDER — DOXYLAMINE-PYRIDOXINE 10-10 MG PO TBEC: 2.0000 | DELAYED_RELEASE_TABLET | Freq: Every day | ORAL | 0 refills | Status: DC

## 2018-08-03 MED ORDER — TRIAMCINOLONE ACETONIDE 0.1 % EX CREA: 1.0000 "application " | TOPICAL_CREAM | Freq: Two times a day (BID) | CUTANEOUS | 1 refills | Status: DC | PRN

## 2018-08-03 NOTE — Assessment & Plan Note (Addendum)
UPreg positive.  Initial prenatal labs drawn this visit.  Prescription for prenatal vitamins sent to pharmacy.  Benefits and risk discussion was had with patient regarding citalopram use, currently category C in pregnancy.  Likely okay to continue during pregnancy if patient receives tremendous benefit from this, however given most of her anxiety is around crowds and social situations may be okay to trial discontinuation giving current recommendations for social isolation.  Will again discuss at initial OB visit, would need to obtain GAD at that time.  Prescribed Diclegis for nausea as well as discussed other symptomatic treatment.  No high risk criteria met, appointment made for initial OB visit in our clinic.

## 2018-08-03 NOTE — Progress Notes (Signed)
Subjective:   Patient ID: Wanda Nixon    DOB: [birth year], 28 y.o. female   MRN: [id number]  Wanda Nixon is a 28 y.o. female with a history of migraine, asthma, anxiety here for   Pregnancy Patient newly pregnant, [id number], with LMP 06/15/2018 and positive home pregnancy test.  States she has not been able to have a full meal in about 3 days due to persistent nausea.  She has been taking Zofran although this has not been helping.  She has vomited occasionally due to certain smells, notes barbecue sauce is particularly triggering.  Has not yet established for prenatal care for this pregnancy. Wants to establish in our clinic for prenatal care. Youngest child is 79-year-old.  Has been using condoms for birth control.  Patient reports 3 prior full-term vaginal deliveries without delivery complications although does report history of cholestasis during pregnancy with 2 of her prior pregnancies. Previously had therapeutic abortion in the fall. This pregnancy is unplanned although desired.  Reports having good support from her husband although fears opinions by family and friends regarding having 4 children under 81 years old.  She desires tubal ligation for contraception after the birth of this child.  She is a non-smoker with no current alcohol use.  She is not taking prenatal vitamins.  Medications and medical history reviewed with patient.  She takes citalopram for anxiety which was recently started in early March which she feels is helping some with her anxiety.  States most of her anxiety comes from being in social situations and being in crowds.  She has never had postpartum depression.  Review of Systems:  Per HPI.  [place], medications and smoking status reviewed.  Objective:   BP 102/62   Pulse 85   Temp 98.9 F (37.2 C) (Oral)   Wt 137 lb 2 oz (62.2 kg)   LMP 06/15/2018   SpO2 99%   BMI 27.70 kg/m  Vitals and nursing note reviewed.  General: well nourished, well developed, in no  acute distress with non-toxic appearance HEENT: normocephalic, atraumatic, moist mucous membranes CV: regular rate and rhythm without murmurs, rubs, or gallops, no lower extremity edema Lungs: clear to auscultation bilaterally with normal work of breathing Abdomen: soft, non-tender, non-distended, no masses or organomegaly palpable, normoactive bowel sounds Skin: warm, dry, no rashes or lesions Extremities: warm and well perfused, normal tone MSK: ROM grossly intact, strength intact, gait normal Neuro: Alert and oriented, speech normal  Assessment & Plan:   Supervision of normal pregnancy UPreg positive.  Initial prenatal labs drawn this visit.  Prescription for prenatal vitamins sent to pharmacy.  Benefits and risk discussion was had with patient regarding citalopram use, currently category C in pregnancy.  Likely okay to continue during pregnancy if patient receives tremendous benefit from this, however given most of her anxiety is around crowds and social situations may be okay to trial discontinuation giving current recommendations for social isolation.  Will again discuss at initial OB visit, would need to obtain GAD at that time.  Prescribed Diclegis for nausea as well as discussed other symptomatic treatment.  No high risk criteria met, appointment made for initial OB visit in our clinic.  Orders Placed This Encounter  Procedures  . Culture, OB Urine  . Obstetric Panel, Including HIV([facility])  . HGB FRAC. W/SOLUBILITY  . POCT urine pregnancy  . POCT urinalysis dipstick  . POCT UA - Microscopic Only   Meds ordered this encounter  Medications  . triamcinolone cream (KENALOG) 0.1 %  Sig: Apply 1 application topically 2 (two) times daily as needed (rash/itching).    Dispense:  30 g    Refill:  1  . Doxylamine-Pyridoxine 10-10 MG TBEC    Sig: Take 2 tablets by mouth at bedtime. May take additional tablet in the morning if nausea not well controlled.    Dispense:  60 tablet     Refill:  0  . Prenatal Vit-Fe Fumarate-FA (PRENATAL VITAMIN) 27-0.8 MG TABS    Sig: Take 1 tablet by mouth daily.    Dispense:  90 tablet    Refill:  Bairoa La [place], DO PGY-2, [facility] 08/03/2018 3:12 PM

## 2018-08-03 NOTE — Patient Instructions (Signed)
It was great to see you!  Our plans for today:  - We will obtain your initial OB labs. - Take 2 tablets of diclegis at night before bed to help with nausea. Be sure to eat small, frequent meals throughout the day and drink plenty of water or ginger ale. Ginger-containing foods have shown to help with nausea.  - We will schedule you a visit to be seen for your initial OB visit in about a week or so.   Take care and seek immediate care sooner if you develop any concerns.   Dr. [facility]

## 2018-08-06 LAB — CULTURE, OB URINE

## 2018-08-06 LAB — OBSTETRIC PANEL, INCLUDING HIV
Antibody Screen: NEGATIVE
Basophils Absolute: 0 10*3/uL (ref 0.0–0.2)
Basos: 0 %
EOS (ABSOLUTE): 0.1 10*3/uL (ref 0.0–0.4)
Eos: 2 %
HIV Screen 4th Generation wRfx: NONREACTIVE
Hematocrit: 37.4 % (ref 34.0–46.6)
Hemoglobin: 13.2 g/dL (ref 11.1–15.9)
Hepatitis B Surface Ag: NEGATIVE
Immature Grans (Abs): 0 10*3/uL (ref 0.0–0.1)
Immature Granulocytes: 0 %
Lymphocytes Absolute: 0.8 10*3/uL (ref 0.7–3.1)
Lymphs: 13 %
MCH: 28.5 pg (ref 26.6–33.0)
MCHC: 35.3 g/dL (ref 31.5–35.7)
MCV: 81 fL (ref 79–97)
Monocytes Absolute: 0.3 10*3/uL (ref 0.1–0.9)
Monocytes: 6 %
Neutrophils Absolute: 4.5 10*3/uL (ref 1.4–7.0)
Neutrophils: 79 %
Platelets: 342 10*3/uL (ref 150–450)
RBC: 4.63 x10E6/uL (ref 3.77–5.28)
RDW: 14.8 % (ref 11.7–15.4)
RPR Ser Ql: NONREACTIVE
Rh Factor: POSITIVE
Rubella Antibodies, IGG: 3.9 index (ref >0.99)
WBC: 5.7 10*3/uL (ref 3.4–10.8)

## 2018-08-06 LAB — HGB FRAC. W/SOLUBILITY
Hgb A2 Quant: 2 % (ref 1.8–3.2)
Hgb A: 98 % (ref 96.4–98.8)
Hgb C: 0 %
Hgb F Quant: 0 % (ref 0.0–2.0)
Hgb S: 0 %
Hgb Solubility: NEGATIVE
Hgb Variant: 0 %

## 2018-08-06 LAB — URINE CULTURE, OB REFLEX

## 2018-08-07 ENCOUNTER — Telehealth: Payer: Self-pay

## 2018-08-07 NOTE — Telephone Encounter (Signed)
Pt called nurse line wanting to reschedule her new OB apt from good Friday. Pt would like this to be on 4/15 anytime that morning. Please call her with confirmation.

## 2018-08-07 NOTE — Telephone Encounter (Signed)
Pt would also like a pregnancy confirmation letter faxed to her, so she can give to her insurance. This has been faxed.   Fax # [phone]

## 2018-08-20 ENCOUNTER — Ambulatory Visit (INDEPENDENT_AMBULATORY_CARE_PROVIDER_SITE_OTHER): Payer: Medicaid Other | Admitting: Family Medicine

## 2018-08-20 ENCOUNTER — Other Ambulatory Visit: Payer: Self-pay

## 2018-08-20 VITALS — BP 114/72 | HR 81 | Temp 98.8°F | Wt 140.8 lb

## 2018-08-20 DIAGNOSIS — R112 Nausea with vomiting, unspecified: Secondary | ICD-10-CM

## 2018-08-20 DIAGNOSIS — Z3491 Encounter for supervision of normal pregnancy, unspecified, first trimester: Secondary | ICD-10-CM | POA: Diagnosis not present

## 2018-08-20 MED ORDER — CLOTRIMAZOLE 1 % EX CREA: 1.0000 "application " | TOPICAL_CREAM | Freq: Two times a day (BID) | CUTANEOUS | 0 refills | Status: DC

## 2018-08-20 MED ORDER — FOLIC ACID 1 MG PO TABS: 1.0000 mg | ORAL_TABLET | Freq: Every day | ORAL | 0 refills | Status: DC

## 2018-08-20 MED ORDER — PROMETHAZINE HCL 12.5 MG PO TABS: 12.5000 mg | ORAL_TABLET | Freq: Three times a day (TID) | ORAL | 0 refills | Status: DC | PRN

## 2018-08-20 NOTE — Patient Instructions (Signed)
We will get a dating ultrasound to get a better understanding of how far along in your pregnancy you are. We reviewed the previous labs from your last visit. Please come back in 4-6 weeks. Let us know if you are still having trouble with nausea.

## 2018-08-20 NOTE — Progress Notes (Signed)
Wanda Nixon is a 28 y.o. yo G5P3003 at [redacted]w[redacted]d by last menstrual period who presents for her initial prenatal visit. Patient initially seen back on 4/3 to confirm pregnancy test. Had urine cx, obstetric panel, urine study, and hgb fraction panel performed at that time which was all negative for abnormality.  Pregnancy Is planned She reports fatigue, morning sickness and nausea. She  is not taking PNV. See flow sheet for details.  PMH, POBH, FH, meds, allergies and Social Hx reviewed.  Prenatal Exam: Gen: Well nourished, well developed.  No distress.  Vitals noted. HEENT: Normocephalic, atraumatic.  Neck supple without cervical lymphadenopathy, thyromegaly or thyroid nodules.  Fair dentition. CV: RRR no murmur, gallops or rubs Lungs: CTAB.  Normal respiratory effort without wheezes or rales. Abd: soft, NTND. +BS.  Uterus not appreciated above pelvis. GU: Normal external female genitalia without lesions.  Normal vaginal, well rugated without lesions. No vaginal discharge.  Bimanual exam: No adnexal mass or TTP. No CMT.  Uterus size 8cm. Unable to locate fetal heart tones during exam. Ext: No clubbing, cyanosis or edema. Psych: Normal grooming and dress.  Not depressed or anxious appearing.  Normal thought content and process without flight of ideas or looseness of associations.  Assessment & Plan: 1) 28 y.o. yo G5P3003 at [redacted]w[redacted]d via LMP doing well. Patient is unsure of the approximate date of conception and says that last period was very short and mild. She has started having symptoms of nausea at around this time, so wonders if she may have been pregnant during last menses. Will get dating ultrasound to provide better clarity towards dating. Current pregnancy issues include  Dating is not reliable. Prenatal labs reviewed all without abnormality. Genetic screening offered: Patient is not interested at this time. Early glucola is indicated based on AA ethnicity, diabetes in first degree  relative. PHQ-9 and Pregnancy Medical Home forms completed and reviewed.  Bleeding and pain precautions reviewed. Importance of prenatal vitamins reviewed.  Follow up in 4-6 weeks.  2) Gave prescription for phenergan. Patient has tried zofran and diclegis without any benefit. Stated that phenergan has been great for her nausea during other pregnancies.  Guadalupe Dawn MD PGY-2 Family Medicine Resident

## 2018-08-23 ENCOUNTER — Ambulatory Visit (HOSPITAL_COMMUNITY)
Admission: RE | Admit: 2018-08-23 | Discharge: 2018-08-23 | Disposition: A | Discharge status: Home / Self Care | Payer: Medicaid Other | Source: Ambulatory Visit | Attending: Family Medicine | Admitting: Family Medicine

## 2018-08-23 ENCOUNTER — Other Ambulatory Visit: Payer: Self-pay | Admitting: Family Medicine

## 2018-08-23 ENCOUNTER — Other Ambulatory Visit: Payer: Self-pay

## 2018-08-23 DIAGNOSIS — Z3491 Encounter for supervision of normal pregnancy, unspecified, first trimester: Secondary | ICD-10-CM | POA: Insufficient documentation

## 2018-08-24 ENCOUNTER — Other Ambulatory Visit: Payer: Self-pay

## 2018-08-24 ENCOUNTER — Encounter (HOSPITAL_COMMUNITY): Payer: Self-pay

## 2018-08-24 ENCOUNTER — Inpatient Hospital Stay (HOSPITAL_COMMUNITY)
Admission: AD | Admit: 2018-08-24 | Discharge: 2018-08-24 | Disposition: A | Discharge status: Home / Self Care | Payer: Medicaid Other | Attending: Obstetrics & Gynecology | Admitting: Obstetrics & Gynecology

## 2018-08-24 DIAGNOSIS — O99351 Diseases of the nervous system complicating pregnancy, first trimester: Secondary | ICD-10-CM | POA: Diagnosis not present

## 2018-08-24 DIAGNOSIS — Z833 Family history of diabetes mellitus: Secondary | ICD-10-CM | POA: Diagnosis not present

## 2018-08-24 DIAGNOSIS — R102 Pelvic and perineal pain: Secondary | ICD-10-CM | POA: Diagnosis not present

## 2018-08-24 DIAGNOSIS — Z79899 Other long term (current) drug therapy: Secondary | ICD-10-CM | POA: Insufficient documentation

## 2018-08-24 DIAGNOSIS — R109 Unspecified abdominal pain: Secondary | ICD-10-CM | POA: Diagnosis not present

## 2018-08-24 DIAGNOSIS — O9989 Other specified diseases and conditions complicating pregnancy, childbirth and the puerperium: Secondary | ICD-10-CM | POA: Diagnosis not present

## 2018-08-24 DIAGNOSIS — Z91018 Allergy to other foods: Secondary | ICD-10-CM | POA: Diagnosis not present

## 2018-08-24 DIAGNOSIS — F329 Major depressive disorder, single episode, unspecified: Secondary | ICD-10-CM | POA: Insufficient documentation

## 2018-08-24 DIAGNOSIS — Z888 Allergy status to other drugs, medicaments and biological substances status: Secondary | ICD-10-CM | POA: Insufficient documentation

## 2018-08-24 DIAGNOSIS — O99511 Diseases of the respiratory system complicating pregnancy, first trimester: Secondary | ICD-10-CM | POA: Insufficient documentation

## 2018-08-24 DIAGNOSIS — G43019 Migraine without aura, intractable, without status migrainosus: Secondary | ICD-10-CM | POA: Diagnosis not present

## 2018-08-24 DIAGNOSIS — D649 Anemia, unspecified: Secondary | ICD-10-CM | POA: Insufficient documentation

## 2018-08-24 DIAGNOSIS — O26891 Other specified pregnancy related conditions, first trimester: Secondary | ICD-10-CM | POA: Diagnosis not present

## 2018-08-24 DIAGNOSIS — O99341 Other mental disorders complicating pregnancy, first trimester: Secondary | ICD-10-CM | POA: Insufficient documentation

## 2018-08-24 DIAGNOSIS — Z3A10 10 weeks gestation of pregnancy: Secondary | ICD-10-CM | POA: Insufficient documentation

## 2018-08-24 DIAGNOSIS — O99011 Anemia complicating pregnancy, first trimester: Secondary | ICD-10-CM | POA: Diagnosis not present

## 2018-08-24 DIAGNOSIS — J45909 Unspecified asthma, uncomplicated: Secondary | ICD-10-CM | POA: Insufficient documentation

## 2018-08-24 LAB — URINALYSIS, ROUTINE W REFLEX MICROSCOPIC
Bilirubin Urine: NEGATIVE
Glucose, UA: NEGATIVE mg/dL
Hgb urine dipstick: NEGATIVE
Ketones, ur: NEGATIVE mg/dL
Nitrite: NEGATIVE
Protein, ur: NEGATIVE mg/dL
Specific Gravity, Urine: 1.028 (ref 1.005–1.030)
pH: 6 (ref 5.0–8.0)

## 2018-08-24 MED ORDER — DEXAMETHASONE SODIUM PHOSPHATE 10 MG/ML IJ SOLN
10.0000 mg | Freq: Once | INTRAMUSCULAR | Status: AC
  Administered 2018-08-24: 10 mg via INTRAVENOUS
  Filled 2018-08-24: qty 1

## 2018-08-24 MED ORDER — LACTATED RINGERS IV BOLUS
1000.0000 mL | Freq: Once | INTRAVENOUS | Status: AC
  Administered 2018-08-24: 1000 mL via INTRAVENOUS

## 2018-08-24 MED ORDER — BUTALBITAL-APAP-CAFFEINE 50-325-40 MG PO TABS: 1.0000 | ORAL_TABLET | Freq: Four times a day (QID) | ORAL | 0 refills | Status: DC | PRN

## 2018-08-24 MED ORDER — METOCLOPRAMIDE HCL 5 MG/ML IJ SOLN
10.0000 mg | Freq: Once | INTRAMUSCULAR | Status: DC
  Filled 2018-08-24: qty 2

## 2018-08-24 MED ORDER — DIPHENHYDRAMINE HCL 50 MG/ML IJ SOLN
25.0000 mg | Freq: Once | INTRAMUSCULAR | Status: AC
  Administered 2018-08-24: 25 mg via INTRAVENOUS
  Filled 2018-08-24: qty 1

## 2018-08-24 NOTE — MAU Provider Note (Addendum)
History     CSN: [id number]  Arrival date and time: 08/24/18 1805   First Provider Initiated Contact with Patient 08/24/18 1844      Chief Complaint  Patient presents with  . Headache  . Abdominal Pain   Wanda Nixon is a 28 y.o. G5P3003 at [redacted]w[redacted]d who presents today with a headache. She states that it started yesterday. She took tylenol and benadryl yesterday, and felt better. However it returned again today. She took tylenol again, but it didn't help this time. Patient is getting [facility] at family medicine. She had an [place] on 08/23/2018 that showed viable IUP. Next OB visit: not scheduled at this time.   Headache   This is a new problem. The current episode started yesterday. The problem occurs constantly. The problem has been unchanged. The pain is located in the retro-orbital region. The pain quality is similar to prior headaches. The quality of the pain is described as dull. The pain is at a severity of 7/10. Associated symptoms include nausea and vomiting. Pertinent negatives include no fever. The symptoms are aggravated by bright light. She has tried acetaminophen for the symptoms. The treatment provided no relief.  Pelvic Pain  The patient's primary symptoms include pelvic pain. The patient's pertinent negatives include no vaginal discharge. This is a new problem. The current episode started yesterday. The problem occurs constantly. The problem has been unchanged. Pain severity now: 6/10  The problem affects both sides. She is pregnant. Associated symptoms include dysuria, nausea and vomiting. Pertinent negatives include no chills, fever or frequency. The vaginal discharge was normal. There has been no bleeding. The symptoms are aggravated by activity. She has tried acetaminophen for the symptoms. The treatment provided no relief.    OB History    Gravida  5   Para  3   Term  3   Preterm  0   AB      Living  3     SAB      TAB      Ectopic      Multiple  0   Live  Births  3           Past Medical History:  Diagnosis Date  . Anemia   . Asthma    Last used 08/30/14; weekly inhaler use, used inhaler 2 weeks ago  . Cholestasis of pregnancy in third trimester 07/25/2015  . Depression    celexa off for several months  . Eczema   . Headache(784.0)    migraines  . NST (non-stress test) nonreactive   . SVD (spontaneous vaginal delivery) 07/26/2015    Past Surgical History:  Procedure Laterality Date  . MOUTH SURGERY    . TYMPANOSTOMY TUBE PLACEMENT    . WISDOM TOOTH EXTRACTION      Family History  Problem Relation Age of Onset  . Diabetes Maternal Grandmother   . Hypertension Maternal Grandmother   . Stroke Maternal Grandmother   . Hypertension Mother     Social History   Tobacco Use  . Smoking status: Never Smoker  . Smokeless tobacco: Never Used  Substance Use Topics  . Alcohol use: No  . Drug use: No    Allergies:  Allergies  Allergen Reactions  . Pulmicort [Budesonide] Palpitations  . Tomato Hives and Itching  . Mushroom Extract Complex Hives and Itching    Medications Prior to Admission  Medication Sig Dispense Refill Last Dose  . Prenatal Vit-Fe Fumarate-FA (PRENATAL VITAMIN) 27-0.8 MG TABS Take 1  tablet by mouth daily. 90 tablet 1 08/23/2018 at Unknown time  . promethazine (PHENERGAN) 12.5 MG tablet Take 1 tablet (12.5 mg total) by mouth every 8 (eight) hours as needed for nausea or vomiting. 20 tablet 0 Past Week at Unknown time  . albuterol (PROVENTIL HFA;VENTOLIN HFA) 108 (90 Base) MCG/ACT inhaler Inhale 2 puffs into the lungs every 6 (six) hours as needed for wheezing or shortness of breath.   Past Week at Unknown time  . citalopram (CELEXA) 20 MG tablet Take 1 tablet (20 mg total) by mouth daily. 90 tablet 1   . clotrimazole (LOTRIMIN) 1 % cream Apply 1 application topically 2 (two) times daily for 7 days. 14 g 0   . Doxylamine-Pyridoxine 10-10 MG TBEC Take 2 tablets by mouth at bedtime. May take additional tablet  in the morning if nausea not well controlled. 60 tablet 0   . folic acid (FOLVITE) 1 MG tablet Take 1 tablet (1 mg total) by mouth daily. 30 tablet 0   . triamcinolone cream (KENALOG) 0.1 % Apply 1 application topically 2 (two) times daily as needed (rash/itching). 30 g 1     Review of Systems  Constitutional: Negative for chills and fever.  Gastrointestinal: Positive for nausea and vomiting.  Genitourinary: Positive for dysuria and pelvic pain. Negative for frequency, vaginal bleeding and vaginal discharge.   Physical Exam   Blood pressure 112/76, pulse 94, temperature 99 F (37.2 C), temperature source Oral, resp. rate 16, weight 63.2 kg, last menstrual period 06/15/2018, SpO2 100 %, unknown if currently breastfeeding.  Physical Exam  Nursing note and vitals reviewed. Constitutional: She is oriented to person, place, and time. She appears well-developed and well-nourished. No distress.  HENT:  Head: Normocephalic.  Cardiovascular: Normal rate.  Respiratory: Effort normal.  GI: Soft. There is no abdominal tenderness. There is no rebound.  Neurological: She is alert and oriented to person, place, and time.  Skin: Skin is warm and dry.  Psychiatric: She has a normal mood and affect.   Results for orders placed or performed during the hospital encounter of 08/24/18 (from the past 24 hour(s))  Urinalysis, Routine w reflex microscopic     Status: Abnormal   Collection Time: 08/24/18  6:23 PM  Result Value Ref Range   Color, Urine YELLOW YELLOW   APPearance HAZY (A) CLEAR   Specific Gravity, Urine 1.028 1.005 - 1.030   pH 6.0 5.0 - 8.0   Glucose, UA NEGATIVE NEGATIVE mg/dL   Hgb urine dipstick NEGATIVE NEGATIVE   Bilirubin Urine NEGATIVE NEGATIVE   Ketones, ur NEGATIVE NEGATIVE mg/dL   Protein, ur NEGATIVE NEGATIVE mg/dL   Nitrite NEGATIVE NEGATIVE   Leukocytes,Ua LARGE (A) NEGATIVE   RBC / HPF 0-5 0 - 5 RBC/hpf   WBC, UA 0-5 0 - 5 WBC/hpf   Bacteria, UA FEW (A) NONE SEEN    Squamous Epithelial / LPF 6-10 0 - 5   Mucus PRESENT    Budding Yeast PRESENT    US Ob Comp Less 14 Wks  Result Date: 08/23/2018 CLINICAL DATA:  Dating. Estimated gestational age of [redacted] weeks, 6 days by LMP. EXAM: OBSTETRIC <14 WK ULTRASOUND TECHNIQUE: Transabdominal ultrasound was performed for evaluation of the gestation as well as the maternal uterus and adnexal regions. COMPARISON:  Pelvic ultrasound dated December 30, 2017. FINDINGS: Intrauterine gestational sac: Single Yolk sac:  Visualized. Embryo:  Visualized. Cardiac Activity: Visualized. Heart Rate: 165 bpm CRL:   3  mm   9 w 5 d  [place] EDC: 03/23/2019 Subchorionic hemorrhage:  None visualized. Maternal uterus/adnexae: Unremarkable.  Left corpus luteum. IMPRESSION: 1. Single, live intrauterine pregnancy with estimated gestational age of [redacted] weeks, 5 days. Electronically Signed   By: Titus Dubin M.D.   On: 08/23/2018 14:52    MAU Course  Procedures  MDM Patient given LR bolus, 10mg  decadron, 10mg  reglan, 25mg  benadryl IV 7:58 PM Care turned over Darrol Poke, CNM Marcille Buffy DNP, CNM  08/24/18  7:58 PM   Reassessment after HA cocktail, patient reports HA is relieved with medication. Discussed with patient hx of migraines prior to pregnancy and safe medication she can take for migraines. Rx for Fioricet sent to pharmacy of choice. Pt stable at time of discharge. Follow up as scheduled for prenatal care at family medicine.   Assessment and Plan   1. Intractable migraine without aura and without status migrainosus   2. [redacted] weeks gestation of pregnancy    Discharge home Make appointment for next prenatal visit  Return to MAU as needed for reasons discussed  Rx for Fioricet sent  Safe medications during pregnancy discussed   Follow-up Information    [facility] Follow up.   Why:  Follow up as scheduled for prenatal appointments  Contact information: [place] [phone]         Allergies as of 08/24/2018      Reactions   Pulmicort [budesonide] Palpitations   Tomato Hives, Itching   Mushroom Extract Complex Hives, Itching   Reglan [metoclopramide] Anxiety   Pt reports makes her anxiety worse.       Medication List    STOP taking these medications   clotrimazole 1 % cream Commonly known as:  LOTRIMIN     TAKE these medications   albuterol 108 (90 Base) MCG/ACT inhaler Commonly known as:  VENTOLIN HFA Inhale 2 puffs into the lungs every 6 (six) hours as needed for wheezing or shortness of breath.   butalbital-acetaminophen-caffeine 50-325-40 MG tablet Commonly known as:  FIORICET Take 1-2 tablets by mouth every 6 (six) hours as needed for headache.   citalopram 20 MG tablet Commonly known as:  CELEXA Take 1 tablet (20 mg total) by mouth daily.   Doxylamine-Pyridoxine 10-10 MG Tbec Take 2 tablets by mouth at bedtime. May take additional tablet in the morning if nausea not well controlled.   folic acid 1 MG tablet Commonly known as:  FOLVITE Take 1 tablet (1 mg total) by mouth daily.   Prenatal Vitamin 27-0.8 MG Tabs Take 1 tablet by mouth daily.   promethazine 12.5 MG tablet Commonly known as:  PHENERGAN Take 1 tablet (12.5 mg total) by mouth every 8 (eight) hours as needed for nausea or vomiting.   triamcinolone cream 0.1 % Commonly known as:  KENALOG Apply 1 application topically 2 (two) times daily as needed (rash/itching).      Lajean Manes, CNM 08/24/18, 11:23 PM

## 2018-08-24 NOTE — MAU Note (Addendum)
Pt having a migraine rates 9/10, has tried OTC meds but not working. Also having lower abdominal cramps rating 7/10, hurts when she walks. Has been very nauseous and vomiting. Also is having a lot of spitting. Recently trying phenergan but didn't take today because she wasn't sure she could keep it down.

## 2018-08-24 NOTE — Discharge Instructions (Signed)

## 2018-08-24 NOTE — Progress Notes (Signed)
Wende Bushy CNM in earlier to discuss d/c plan. Written and verbal d/c instructions given and understanding voiced.

## 2018-09-03 ENCOUNTER — Telehealth: Payer: Self-pay | Admitting: Family Medicine

## 2018-09-03 NOTE — Telephone Encounter (Signed)
Pt called about a few concerns about her pregnancy, and about a refill. Please give pt a call back.

## 2018-09-06 NOTE — Telephone Encounter (Signed)
Appt made for pt to talk to Dr Lindell Noe Friday. Ottis Stain, CMA

## 2018-09-06 NOTE — Telephone Encounter (Signed)
This would be a good virtual visit. Please call and offer her this.

## 2018-09-07 ENCOUNTER — Encounter: Payer: Self-pay | Admitting: Family Medicine

## 2018-09-07 ENCOUNTER — Telehealth (INDEPENDENT_AMBULATORY_CARE_PROVIDER_SITE_OTHER): Payer: Medicaid Other | Admitting: Family Medicine

## 2018-09-07 ENCOUNTER — Ambulatory Visit (INDEPENDENT_AMBULATORY_CARE_PROVIDER_SITE_OTHER): Payer: Medicaid Other | Admitting: Family Medicine

## 2018-09-07 ENCOUNTER — Other Ambulatory Visit: Payer: Self-pay

## 2018-09-07 VITALS — BP 124/88 | HR 95 | Wt 140.4 lb

## 2018-09-07 DIAGNOSIS — Z332 Encounter for elective termination of pregnancy: Secondary | ICD-10-CM | POA: Diagnosis not present

## 2018-09-07 DIAGNOSIS — M79604 Pain in right leg: Secondary | ICD-10-CM

## 2018-09-07 DIAGNOSIS — R2243 Localized swelling, mass and lump, lower limb, bilateral: Secondary | ICD-10-CM | POA: Diagnosis not present

## 2018-09-07 DIAGNOSIS — M79605 Pain in left leg: Secondary | ICD-10-CM | POA: Diagnosis not present

## 2018-09-07 DIAGNOSIS — Z3009 Encounter for other general counseling and advice on contraception: Secondary | ICD-10-CM

## 2018-09-07 DIAGNOSIS — Z01818 Encounter for other preprocedural examination: Secondary | ICD-10-CM

## 2018-09-07 LAB — POCT URINE PREGNANCY: Preg Test, Ur: POSITIVE — AB

## 2018-09-07 LAB — POCT HEMOGLOBIN: Hemoglobin: 13.2 g/dL (ref 11–14.6)

## 2018-09-07 MED ORDER — NORETHINDRONE ACET-ETHINYL EST 1-20 MG-MCG PO TABS: 1.0000 | ORAL_TABLET | Freq: Every day | ORAL | 11 refills | Status: DC

## 2018-09-07 NOTE — Progress Notes (Deleted)
Lateral calves both legs, worse on R, feet are swelling at night. R is much worse, L leg is 3/10, R is 6/10. On the table for the procedure, she felt her ears ringing and dry mouth. Pain in leg started this Sunday 5/3. Has tried ibuprofen and tylenol. Those didn't really help. They gave her one antibiotic, a pink pill. Hasn't been taking the fioricet, has been taking celexa daily. No other OTCs or vitamins. Has tried icy hot, which helped some.

## 2018-09-07 NOTE — Patient Instructions (Signed)
Treat as a muscle strain - apply ice or heat, which ever feels better, and use tylenol as needed. It is very unlikely you have a blood clot. Please call me if it gets worse.   Fill your birth control pills once you have had a negative pregnancy test at home. Test weekly. Please call me when it is negative.

## 2018-09-07 NOTE — Progress Notes (Signed)
   CC: leg pains  HPI  Lateral calves both legs, worse on R, feet are swelling at night. R is much worse, L leg is 3/10, R is 6/10. On the table for the TAB procedure, she felt her ears ringing and dry mouth. Pain in leg started this Sunday 5/3. Has tried ibuprofen and tylenol. Those didn't really help. They gave her one antibiotic, a pink pill, she doesn't recall the name. No hx of antibiotic allergies. Hasn't been taking the fioricet, has been taking celexa daily. No other OTCs or vitamins. Has tried icy hot, which helped some.   Recent TAB- see phone note from this am.  She feels like overall she is handling this emotionally quite well.  Her UPT is positive in the office.  ROS: Denies CP, SOB, abdominal pain, dysuria, changes in BMs.   CC, SH/smoking status, and VS noted  Objective: LMP 06/15/2018  Gen: NAD, alert, cooperative, and pleasant. HEENT: NCAT, EOMI, PERRL CV: RRR, no murmur Resp: CTAB, no wheezes, non-labored Abd: SNTND, BS present, no guarding or organomegaly Ext: No edema, warm, no cords, negative homans signs. No skin changes. No masses or swelling. Pain is in mid shin immediately lateral to tibia.  Neuro: Alert and oriented, Speech clear, No gross deficits  Assessment and plan:  Leg pain: unclear etiology, very low suspicision for DVT as bilateral. Suspect overuse/MSK etiology. Treat with supportive shoes and relative rest.  Can use topical heat or ice and Tylenol.  Call if no improvement.  Less likely anemia as hemoglobin was normal.  No overlying rashes.  Unlikely to be related to her recent procedure.  Recent TAB: UPT positive in the office, which is expected.  Patient should do a home pregnancy test next week and if negative she may begin her OCPs which was prescribed today. No sex until 4weeks after procedure. Call if home UPT is not negative. Let's recheck in 1 month to assess mood.   Orders Placed This Encounter  Procedures  . POCT urine pregnancy  . POCT  hemoglobin    No orders of the defined types were placed in this encounter.  Ralene Ok, MD, PGY3 09/07/2018 1:40 PM

## 2018-09-07 NOTE — Progress Notes (Signed)
[place] Telemedicine Visit  Patient consented to have virtual visit. Method of visit: Telephone  Encounter participants: Patient: Wanda Nixon - located at home Provider: Ralene Ok - located at [facility] Others (if applicable): none  Chief Complaint: recent TAB   HPI:  She decided not to keep the pregnancy. She was having lots of sickness and is also anxious and felt this was not a good time for another baby.  Procedure was 4/28 at [facility]. She has been having sharp pains and cramping in her legs. Her feet hurt on plantar surfaces bilaterally. Her feet have been swelling at night. This started on Sunday. Legs hurt constantly. Both legs, but R hurts more than L. No rashes. She has been having bad headaches as well. No bleeding since the first day it was done. Legs don't look swollen her right now. No fevers.   Birth control - she has tried depo and IUD in the past. She didn't like the IUD placed PP. She felt she gained weight on depo. Wants to get her tubes tied. She would like to try OCPs as she already has an alarm for her celexa. She is using condoms all the time. Last sexual encounter was before the procedure.   ROS: per HPI  Pertinent PMHx: recent pregnancy.   Exam:  Respiratory: easily speaks in long sentences  Assessment/Plan:  Leg pain: tough to eval over the phone, she is concerned about DVT. I think this is very unlikely with bilateral pain, but will have her come this afternoon for exam.   Recent TAB -patient had spotting the day after and has abstain from sex.  No abdominal pain.  Has not taken a home pregnancy test.  Will get a UPT this afternoon when she comes in and if this is negative we can begin birth control.  If positive, we will repeat next week at home.  Need for birth control -start OCPs per patient choice at the time of first negative pregnancy test.  Abstain until then.  Refer to [facility] for discussion of tubal  ligation.  Time spent during visit with patient: 15 minutes

## 2018-10-03 ENCOUNTER — Other Ambulatory Visit: Payer: Self-pay | Admitting: *Deleted

## 2018-10-03 MED ORDER — ALBUTEROL SULFATE HFA 108 (90 BASE) MCG/ACT IN AERS: 2.0000 | INHALATION_SPRAY | Freq: Four times a day (QID) | RESPIRATORY_TRACT | 3 refills | Status: DC | PRN

## 2018-10-26 ENCOUNTER — Ambulatory Visit: Payer: Medicaid Other | Admitting: Family Medicine

## 2018-10-26 ENCOUNTER — Encounter: Payer: Self-pay | Admitting: Family Medicine

## 2018-10-26 ENCOUNTER — Other Ambulatory Visit: Payer: Self-pay

## 2018-10-26 DIAGNOSIS — F411 Generalized anxiety disorder: Secondary | ICD-10-CM | POA: Diagnosis not present

## 2018-10-26 DIAGNOSIS — J45909 Unspecified asthma, uncomplicated: Secondary | ICD-10-CM

## 2018-10-26 MED ORDER — NORETHINDRONE ACET-ETHINYL EST 1-20 MG-MCG PO TABS: 1.0000 | ORAL_TABLET | Freq: Every day | ORAL | 11 refills | Status: DC

## 2018-10-26 MED ORDER — LORATADINE 10 MG PO TABS: 10.0000 mg | ORAL_TABLET | Freq: Every day | ORAL | 11 refills | Status: DC

## 2018-10-26 NOTE — Progress Notes (Signed)
   CC: breathing, constipation  HPI  Asthma - feels like this is maybe flaring up if she is more hot outside, more SOB when outside. Using her breathing treatment. Used albuterol not yesterday, last week BID. Albuterol makes her feel more anxious as well.   Constipation - using miralax as dosed 1 scoop. Last used it 2 days ago. Has some back and leg pain when constipated. Last BM was Monday. Has been dealing with constipation her whole life.   Feels her anxiety is not controlled lately. Moves her body to help. Feels flushed and jittery in social situations.   ROS: Denies CP, SOB, abdominal pain, dysuria, changes in BMs.   CC, SH/smoking status, and VS noted  Objective: BP 118/64   Pulse (!) 114   Wt 140 lb 12.8 oz (63.9 kg)   LMP 10/03/2018 (Approximate)   SpO2 98%   Breastfeeding No   BMI 28.44 kg/m   Gen: NAD, alert, cooperative, and pleasant. HEENT: NCAT, EOMI, PERRL CV: RRR, no murmur, HR 80s on my exam Resp: CTAB, no wheezes, non-labored Ext: No edema, warm Neuro: Alert and oriented, Speech clear, No gross deficits  Assessment and plan:  Asthma Reviewed 2015 PFTs, not conclusive for asthma. Given continued wheezing, and concern for overlap with anxiety, i'd like to repeat PFTs. If she does have asthma, she needs to be changed from PRN albuterol to PRN symbicort. appt scheduled with Dr. Valentina Lucks for PFTs. Can also add antihistamine while we are waiting to help with allergic component.   Generalized anxiety disorder She feels not well controlled. Trouble finding a therapist thru medicaid, although she thinks she will get BCBS in the next month or so. Can increase to 30mg  celexa by taking 1.5 pills, recheck next month.   Birth control: didn't pick up her OCPs after her TAB. Resent now.    No orders of the defined types were placed in this encounter.   Meds ordered this encounter  Medications  . norethindrone-ethinyl estradiol (LOESTRIN) 1-20 MG-MCG tablet    Sig: Take 1  tablet by mouth daily.    Dispense:  1 Package    Refill:  11  . loratadine (CLARITIN) 10 MG tablet    Sig: Take 1 tablet (10 mg total) by mouth daily.    Dispense:  30 tablet    Refill:  11    Ralene Ok, MD, PGY3 10/30/2018 8:34 AM

## 2018-10-26 NOTE — Patient Instructions (Signed)
For your asthma, let's retest your lung function. In the meantime, add claritin daily and see if that helps.   For your constipation, add miralax until you are having soft bowel movements daily. You can take as much as you need.

## 2018-10-29 NOTE — Assessment & Plan Note (Addendum)
Reviewed 2015 PFTs, not conclusive for asthma. Given continued wheezing, and concern for overlap with anxiety, i'd like to repeat PFTs. If she does have asthma, she needs to be changed from PRN albuterol to PRN symbicort. appt scheduled with Dr. Valentina Lucks for PFTs. Can also add antihistamine while we are waiting to help with allergic component.

## 2018-10-30 NOTE — Assessment & Plan Note (Signed)
She feels not well controlled. Trouble finding a therapist thru medicaid, although she thinks she will get BCBS in the next month or so. Can increase to 30mg  celexa by taking 1.5 pills, recheck next month.

## 2019-01-07 ENCOUNTER — Ambulatory Visit (HOSPITAL_COMMUNITY)
Admission: EM | Admit: 2019-01-07 | Discharge: 2019-01-07 | Disposition: A | Discharge status: Home / Self Care | Payer: Medicaid Other | Attending: Family Medicine | Admitting: Family Medicine

## 2019-01-07 ENCOUNTER — Encounter (HOSPITAL_COMMUNITY): Payer: Self-pay

## 2019-01-07 ENCOUNTER — Other Ambulatory Visit: Payer: Self-pay

## 2019-01-07 ENCOUNTER — Ambulatory Visit (INDEPENDENT_AMBULATORY_CARE_PROVIDER_SITE_OTHER): Payer: Medicaid Other

## 2019-01-07 DIAGNOSIS — S6991XA Unspecified injury of right wrist, hand and finger(s), initial encounter: Secondary | ICD-10-CM | POA: Diagnosis not present

## 2019-01-07 DIAGNOSIS — M79641 Pain in right hand: Secondary | ICD-10-CM

## 2019-01-07 NOTE — Discharge Instructions (Addendum)
Your hand is not broken.  Take ibuprofen for pain.  Expect improvement over several days

## 2019-01-07 NOTE — ED Notes (Signed)
Notified dr Meda Coffee patient is going to have to leave to pick up her children

## 2019-01-07 NOTE — ED Triage Notes (Signed)
Pt present right hand injury, pt was having a conversation with some friends and accidental hitting the table to hard.

## 2019-01-07 NOTE — ED Provider Notes (Signed)
[facility]    CSN: [id number] Arrival date & time: 01/07/19  1535      History   Chief Complaint Chief Complaint  Patient presents with  . Hand Injury    Right     HPI Wanda Nixon is a 28 y.o. female.   HPI  Patient states that she was in a heated conversation.  She was pounding her right hand up and down on the couch, hitting the lateral aspect of her hand (ulnar).  She missed the couch and hit a table forcibly and now has severe pain in her hand, fourth and fifth fingers.  She thinks she may have a fracture.  She is here requesting x-rays  Past Medical History:  Diagnosis Date  . Anemia   . Asthma    Last used 08/30/14; weekly inhaler use, used inhaler 2 weeks ago  . Cholestasis of pregnancy in third trimester 07/25/2015  . Depression    celexa off for several months  . Eczema   . Headache(784.0)    migraines  . NST (non-stress test) nonreactive   . SVD (spontaneous vaginal delivery) 07/26/2015    Patient Active Problem List   Diagnosis Date Noted  . Generalized anxiety disorder 06/21/2018  . Asthma 02/26/2016  . Anxiety state 02/12/2016  . Migraine, unspecified, without mention of intractable migraine without mention of status migrainosus 10/25/2013    Past Surgical History:  Procedure Laterality Date  . MOUTH SURGERY    . TYMPANOSTOMY TUBE PLACEMENT    . WISDOM TOOTH EXTRACTION      OB History    Gravida  5   Para  3   Term  3   Preterm  0   AB      Living  3     SAB      TAB      Ectopic      Multiple  0   Live Births  3            Home Medications    Prior to Admission medications   Medication Sig Start Date End Date Taking? Authorizing Provider  albuterol (VENTOLIN HFA) 108 (90 Base) MCG/ACT inhaler Inhale 2 puffs into the lungs every 6 (six) hours as needed for wheezing or shortness of breath. 10/03/18   Glenis Smoker, MD  butalbital-acetaminophen-caffeine [facility]) [phone] MG tablet Take 1-2 tablets  by mouth every 6 (six) hours as needed for headache. 08/24/18 08/24/19  Lajean Manes, CNM  citalopram (CELEXA) 20 MG tablet Take 1 tablet (20 mg total) by mouth daily. 06/19/18   Glenis Smoker, MD  Doxylamine-Pyridoxine 10-10 MG TBEC Take 2 tablets by mouth at bedtime. May take additional tablet in the morning if nausea not well controlled. 08/03/18   Rory Percy, DO  folic acid (FOLVITE) 1 MG tablet Take 1 tablet (1 mg total) by mouth daily. 08/20/18   Guadalupe Dawn, MD  loratadine (CLARITIN) 10 MG tablet Take 1 tablet (10 mg total) by mouth daily. 10/26/18   Glenis Smoker, MD  norethindrone-ethinyl estradiol (LOESTRIN) 1-20 MG-MCG tablet Take 1 tablet by mouth daily. 10/26/18   Glenis Smoker, MD  Prenatal Vit-Fe Fumarate-FA (PRENATAL VITAMIN) 27-0.8 MG TABS Take 1 tablet by mouth daily. 08/03/18   Rory Percy, DO  triamcinolone cream (KENALOG) 0.1 % Apply 1 application topically 2 (two) times daily as needed (rash/itching). 08/03/18   Rory Percy, DO    Family History Family History  Problem Relation Age of Onset  .  Diabetes Maternal Grandmother   . Hypertension Maternal Grandmother   . Stroke Maternal Grandmother   . Hypertension Mother     Social History Social History   Tobacco Use  . Smoking status: Never Smoker  . Smokeless tobacco: Never Used  Substance Use Topics  . Alcohol use: No  . Drug use: No     Allergies   Pulmicort [budesonide], Tomato, Mushroom extract complex, and Reglan [metoclopramide]   Review of Systems Review of Systems  Constitutional: Negative for chills and fever.  HENT: Negative for ear pain and sore throat.   Eyes: Negative for pain and visual disturbance.  Respiratory: Negative for cough and shortness of breath.   Cardiovascular: Negative for chest pain and palpitations.  Gastrointestinal: Negative for abdominal pain and vomiting.  Genitourinary: Negative for dysuria and hematuria.  Musculoskeletal: Positive for  arthralgias. Negative for back pain.  Skin: Negative for color change and rash.  Neurological: Negative for seizures and syncope.  All other systems reviewed and are negative.    Physical Exam Triage Vital Signs ED Triage Vitals  Enc Vitals Group     BP 01/07/19 1633 134/81     Pulse Rate 01/07/19 1633 84     Resp 01/07/19 1633 16     Temp 01/07/19 1633 98.4 F (36.9 C)     Temp Source 01/07/19 1633 Oral     SpO2 01/07/19 1633 100 %     Weight --      Height --      Head Circumference --      Peak Flow --      Pain Score 01/07/19 1636 8     Pain Loc --      Pain Edu? --      Excl. in [place]? --    No data found.  Updated Vital Signs BP 134/81 (BP Location: Left Arm)   Pulse 84   Temp 98.4 F (36.9 C) (Oral)   Resp 16   LMP 12/05/2018 (Exact Date)   SpO2 100%      Physical Exam Constitutional:      General: She is not in acute distress.    Appearance: She is well-developed.  HENT:     Head: Normocephalic and atraumatic.  Eyes:     Conjunctiva/sclera: Conjunctivae normal.     Pupils: Pupils are equal, round, and reactive to light.  Neck:     Musculoskeletal: Normal range of motion.  Cardiovascular:     Rate and Rhythm: Normal rate.  Pulmonary:     Effort: Pulmonary effort is normal. No respiratory distress.  Abdominal:     General: There is no distension.     Palpations: Abdomen is soft.  Musculoskeletal: Normal range of motion.       Hands:  Skin:    General: Skin is warm and dry.  Neurological:     Mental Status: She is alert.      UC Treatments / Results  Labs (all labs ordered are listed, but only abnormal results are displayed) Labs Reviewed - No data to display  EKG   Radiology Dg Hand Complete Right  Result Date: 01/07/2019 CLINICAL DATA:  Per pt: was talking and moving right hand, hit the right hand on the wood table. Pain is the right hand, medially/ulna side, 5th MCP, 5th metacarpal. No prior injury to the right hand. Patient is not a  diabetic EXAM: RIGHT HAND - COMPLETE 3+ VIEW COMPARISON:  None. FINDINGS: There is no evidence of fracture or dislocation.  There is no evidence of arthropathy or other focal bone abnormality. Soft tissues are unremarkable. IMPRESSION: No acute osseous abnormality in the right hand. Electronically Signed   By: Audie Pinto M.D.   On: 01/07/2019 17:50    Procedures Procedures (including critical care time)  Medications Ordered in UC Medications - No data to display  Initial Impression / Assessment and Plan / UC Course  I have reviewed the triage vital signs and the nursing notes.  Pertinent labs & imaging results that were available during my care of the patient were reviewed by me and considered in my medical decision making (see chart for details).     Pain in lateral hand.  X-rays negative.  Discussed expected recovery Final Clinical Impressions(s) / UC Diagnoses   Final diagnoses:  Right hand pain     Discharge Instructions     Your hand is not broken.  Take ibuprofen for pain.  Expect improvement over several days    ED Prescriptions    None     Controlled Substance Prescriptions [place] Controlled Substance Registry consulted? Not Applicable   Raylene Everts, MD 01/07/19 2113

## 2019-01-10 ENCOUNTER — Other Ambulatory Visit: Payer: Self-pay

## 2019-01-10 ENCOUNTER — Telehealth (INDEPENDENT_AMBULATORY_CARE_PROVIDER_SITE_OTHER): Payer: Medicaid Other | Admitting: Family Medicine

## 2019-01-10 ENCOUNTER — Encounter: Payer: Self-pay | Admitting: Licensed Clinical Social Worker

## 2019-01-10 DIAGNOSIS — F411 Generalized anxiety disorder: Secondary | ICD-10-CM | POA: Diagnosis not present

## 2019-01-10 MED ORDER — CITALOPRAM HYDROBROMIDE 20 MG PO TABS: 40.0000 mg | ORAL_TABLET | Freq: Every day | ORAL | 1 refills | Status: DC

## 2019-01-10 NOTE — Progress Notes (Signed)
[facility] Telemedicine Visit  Patient consented to have virtual visit. Method of visit: Telephone  Encounter participants: Patient: Wanda Nixon - located at home Provider: Matilde Haymaker - located at [facility] Others (if applicable): None  Chief Complaint: Worsened anxiety  HPI:  Anxiety: Wanda Nixon reports that, since her last appointment with Dr. Lindell Noe, she has been having worsening anxiety.  At her previous appointment, her Celexa was increased from 20 to 30 mg daily.  She reports that she noticed some improvement with the dose increase initially although, at this point, she appears to be struggling again with increased anxiety.  Her major stressors include work and family life.  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  Following a difficult day, she leaves work to go home in order to care for her 3 children.  Patient enjoys caring for children, this can be very stressful.  She reports that sometimes she feels chest tightness and difficulty breathing from the stress/anxiety.  During his episodes, she locks herself in her bathroom and focuses on her breathing until the episode subsides.  She reports that she often ends up simply crying through these episodes.  She occasionally has vision changes and she is black spots during these episodes.  She has not previously been diagnosed with panic attacks or taken anxiolytics for panic attacks.  In addition to her Celexa, she has been attempting to treat her anxiety at home with relaxation techniques looked up online including things like taking warm baths and drinking tea.  ROS: per HPI  Pertinent PMHx:  History of generalized anxiety disorder  Exam:  Respiratory: Breathing comfortably on room air.  No difficulty completing long sentences without effort.  Assessment/Plan:  Generalized anxiety disorder She appears to  subjectively experiencing worsening anxiety.  She is requesting anxiolytic medication for these episodes of anxiety attacks.  She is open to therapy and online resources. -Increase Celexa to 40 mg daily -Mailed Medicaid appropriate therapy resources -Wanda Nixon made aware of her desire for online resources for stress reduction and anxiety -No anxiolytics given during this encounter, will defer to PCP, Atarax may be helpful in the future -Follow-up in 4 to 6 weeks    Time spent during visit with patient: 15 minutes

## 2019-01-10 NOTE — Social Work (Signed)
   Care Coordination Note Social Work   01/10/2019 Name: [name] MRN: [id number] DOB: July 21, 1990  [name] is a 28 y.o. year old female who sees Meccariello, Bernita Raisin, DO for primary care. LCSW was consulted by Dr. Pilar Plate for assistance with patient education material on anxiety. Dr. Pilar Plate provided patient with counseling resources.  Interventions:Provided patient with information about Generalize anxiety, Relaxation Techniques, tips to cope in uncertain times, tips to worry less and tips on posistive thinking via EMMI patient education system.  Plan: SW will follow up with patient by phone over the next 2 weeks to assess for barriers of connecting to resources provided by Dr. Pilar Plate and to see if she has question about the patient education information.   Dr. Pilar Plate has been informed of this outreach and plan.  Casimer Lanius, LCSW Clinical Social Worker [place] / [place]   [phone] 9:58 AM

## 2019-01-11 ENCOUNTER — Telehealth: Payer: Self-pay | Admitting: Licensed Clinical Social Worker

## 2019-01-11 NOTE — Telephone Encounter (Signed)
Care Coordination Phone outreach Note Social Work   01/11/2019 Name: Wanda Nixon MRN: [id number] DOB: 29-Mar-1991  [name] is a 28 y.o. year old female who sees Meccariello, Bernita Raisin, DO for primary care. LCSW was consulted to provide patient education and coordiantion. Patient education from [facility] was returned. Called patient to double check e-mail address. Also discussed community counseling options with patient to include meeting with Dr. Hartford Poli at our office. Interventions: updated e-mail address and resent patient education information for anxiety. Provided contact number for Dr. Hartford Poli. Plan:  1. Client will review patient education on anxiety 2. Client will call Dr. Hartford Poli to schedule an appointment  3.   No further F/U needed from LCSW at this time  Casimer Lanius, [place] / [place]   [phone] 11:41 AM

## 2019-01-13 NOTE — Assessment & Plan Note (Signed)
She appears to subjectively experiencing worsening anxiety.  She is requesting anxiolytic medication for these episodes of anxiety attacks.  She is open to therapy and online resources. -Increase Celexa to 40 mg daily -Mailed Medicaid appropriate therapy resources -Casimer Lanius made aware of her desire for online resources for stress reduction and anxiety -No anxiolytics given during this encounter, will defer to PCP, Atarax may be helpful in the future -Follow-up in 4 to 6 weeks

## 2019-02-05 ENCOUNTER — Encounter: Payer: Self-pay | Admitting: Family Medicine

## 2019-02-05 ENCOUNTER — Other Ambulatory Visit: Payer: Self-pay | Admitting: Family Medicine

## 2019-02-05 ENCOUNTER — Telehealth: Payer: Self-pay | Admitting: Family Medicine

## 2019-02-05 DIAGNOSIS — L209 Atopic dermatitis, unspecified: Secondary | ICD-10-CM

## 2019-02-05 MED ORDER — TRIAMCINOLONE ACETONIDE 0.1 % EX CREA: 1.0000 "application " | TOPICAL_CREAM | Freq: Two times a day (BID) | CUTANEOUS | 1 refills | Status: DC | PRN

## 2019-02-05 NOTE — Telephone Encounter (Signed)
Pt informed of below.Wanda Nixon, CMA ? ?

## 2019-02-05 NOTE — Progress Notes (Signed)
Kenalog cream refill requested and sent.

## 2019-02-05 NOTE — Telephone Encounter (Signed)
Patient needs a refill on her excema medication sent to [facility] [place]. today asap, please as her skin is very bad right now.

## 2019-02-05 NOTE — Telephone Encounter (Signed)
Rx sent 

## 2019-02-08 ENCOUNTER — Other Ambulatory Visit: Payer: Self-pay

## 2019-02-08 ENCOUNTER — Telehealth (INDEPENDENT_AMBULATORY_CARE_PROVIDER_SITE_OTHER): Payer: Medicaid Other | Admitting: Family Medicine

## 2019-02-08 DIAGNOSIS — Z23 Encounter for immunization: Secondary | ICD-10-CM

## 2019-02-08 NOTE — Progress Notes (Signed)
Attempted to connect with patient on video and she did not respond, attempted to call patient and she did not pick up.  Left voicemail telling her that restarted a message other and that she should call back to reschedule when she has the chance.  Dr. Criss Rosales

## 2019-03-08 ENCOUNTER — Ambulatory Visit (INDEPENDENT_AMBULATORY_CARE_PROVIDER_SITE_OTHER): Payer: Medicaid Other | Admitting: Family Medicine

## 2019-03-08 ENCOUNTER — Encounter: Payer: Self-pay | Admitting: Family Medicine

## 2019-03-08 ENCOUNTER — Other Ambulatory Visit: Payer: Self-pay

## 2019-03-08 VITALS — BP 122/80 | HR 87 | Wt 159.2 lb

## 2019-03-08 DIAGNOSIS — R5383 Other fatigue: Secondary | ICD-10-CM

## 2019-03-08 DIAGNOSIS — L309 Dermatitis, unspecified: Secondary | ICD-10-CM | POA: Diagnosis not present

## 2019-03-08 DIAGNOSIS — B379 Candidiasis, unspecified: Secondary | ICD-10-CM | POA: Diagnosis not present

## 2019-03-08 DIAGNOSIS — F411 Generalized anxiety disorder: Secondary | ICD-10-CM

## 2019-03-08 MED ORDER — HYDROXYZINE HCL 10 MG PO TABS: 10.0000 mg | ORAL_TABLET | Freq: Three times a day (TID) | ORAL | 0 refills | Status: DC | PRN

## 2019-03-08 MED ORDER — FLUCONAZOLE 150 MG PO TABS: 150.0000 mg | ORAL_TABLET | Freq: Once | ORAL | 0 refills | Status: AC

## 2019-03-08 NOTE — Patient Instructions (Signed)
It was great to meet you today! Thank you for letting me participate in your care!  Today, we discussed your fatigue and tiredness. I am getting several labs today to rule out various causes that could contribute to you feeling this way.   For your anxiety I have added hydroxyzine to help with panic attacks. Be careful not to drive or operate any heavy machinery after taking it as it will make you sleepy. I have also given you information to get connected with a therapist/counselor. Please let us know how to help you further.  Be well, Harolyn Rutherford, DO PGY-3, [facility]

## 2019-03-08 NOTE — Progress Notes (Signed)
Subjective: Chief Complaint  Patient presents with  . Fatigue    HPI: Wanda Nixon is a 28 y.o. presenting to clinic today to discuss the following:  Fatigue Patient reports "just not feeling like myself" and "I'm just not me" for the past several weeks. She has a PMH of eczema, anxiety, and is under stress from a new job. No family history of thyroid disease, no personal history of thyroid disease. She denies any temperature intolerance, diarrhea, constipation, hair loss, weight changes, sweating, palpitations, or SOB. She does have generalized body aches and just feels "tired all the time". She does not endorse any infectious symptoms and has not been feeling like she has been sick lately. Just tired.  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 She recently had Celexa increased and thinks it helped some but still does not feel like it is under control. Almost everyday at work she reports feeling like she is going to "have a panic attack". She does endorse reduced anxiety when not having to work. She is not sleeping well due to anxiety.  Eczema Patient requesting referral to allergy specialist for skin testing as she is having recurrent episodes of eczema.  Yeast Infection Patient reports itching with whitish, thick discharge for the past several days. She has had multiple yeast infections in the past and states this feels exactly like it does before. States she gets them "all the time" but does not know why as she could describe and enumerate good hygiene practices.  ROS noted in HPI.    Social History   Tobacco Use  Smoking Status Never Smoker  Smokeless Tobacco Never Used    Objective: BP 122/80   Pulse 87   Wt 159 lb 3.2 oz (72.2 kg)   LMP 06/15/2018   SpO2 99%   BMI 32.15 kg/m  Vitals and nursing notes reviewed  Physical Exam Gen: Alert and Oriented x  3, NAD HEENT: Normocephalic, atraumatic, PERRLA, EOMI Neck: no thyroidmegaly, no LAD CV: RRR, no murmurs, normal S1, S2 split Resp: CTAB, no wheezing, rales, or rhonchi, comfortable work of breathing Abd: non-distended, non-tender, soft, +bs in all four quadrants Ext: no clubbing, cyanosis, or edema Skin: warm, dry, intact, no rashes  Results for orders placed or performed in visit on 03/08/19 (from the past 72 hour(s))  CBC with Differential     Status: None   Collection Time: 03/08/19  3:16 PM  Result Value Ref Range   WBC 5.4 3.4 - 10.8 x10E3/uL   RBC 4.54 3.77 - 5.28 x10E6/uL   Hemoglobin 12.5 11.1 - 15.9 g/dL   Hematocrit 36.1 34.0 - 46.6 %   MCV 80 79 - 97 fL   MCH 27.5 26.6 - 33.0 pg   MCHC 34.6 31.5 - 35.7 g/dL   RDW 14.4 11.7 - 15.4 %   Platelets 347 150 - 450 x10E3/uL   Neutrophils 73 Not Estab. %   Lymphs 17 Not Estab. %   Monocytes 7 Not Estab. %   Eos 3 Not Estab. %   Basos 0 Not Estab. %   Neutrophils Absolute 4.0 1.4 - 7.0 x10E3/uL   Lymphocytes Absolute 0.9 0.7 - 3.1 x10E3/uL   Monocytes Absolute 0.4 0.1 - 0.9 x10E3/uL   EOS (ABSOLUTE) 0.2 0.0 - 0.4 x10E3/uL   Basophils Absolute 0.0 0.0 - 0.2 x10E3/uL   Immature Granulocytes 0  Not Estab. %   Immature Grans (Abs) 0.0 0.0 - 0.1 x10E3/uL  Iron, TIBC and Ferritin Panel     Status: None   Collection Time: 03/08/19  3:16 PM  Result Value Ref Range   Total Iron Binding Capacity 351 250 - 450 ug/dL   UIBC 300 131 - 425 ug/dL   Iron 51 27 - 159 ug/dL   Iron Saturation 15 15 - 55 %   Ferritin 17 15 - 150 ng/mL  Vitamin B12     Status: None   Collection Time: 03/08/19  3:16 PM  Result Value Ref Range   Vitamin B-12 273 232 - 1,245 pg/mL  Folate     Status: None   Collection Time: 03/08/19  3:16 PM  Result Value Ref Range   Folate 9.5 >3.0 ng/mL    Comment: A serum folate concentration of less than 3.1 ng/mL is considered to represent clinical deficiency.   TSH + free T4     Status: None   Collection Time:  03/08/19  3:16 PM  Result Value Ref Range   TSH 1.690 0.450 - 4.500 uIU/mL   Free T4 0.96 0.82 - 1.77 ng/dL    Assessment/Plan:  Anxiety state Anxiety in my estimation is poorly controlled but directly due to her job and stress. - Recommended patient seek behavioral therapy to help reduce response to stress from work - Hydroxyzine prn for anxiety attacks; counseled not to drive or operate heavy machinery after taking - Offered to increase Celexa today but patient declines; she will call in one week to let me know how she is doing and if not better we will increase Celexa to 60mg  daily  Fatigue Ruling out organic causes but I do think her fatigue is more due to stress and anxiety that is poorly controlled - TSH/T4, CBC, Iron panel, B12, and Folate all WNL - Results called in to patient and letter sent  Yeast infection - Diflucan 150mg  once  Eczema - Referral to allergy specialist for testing to see if any connection to her eczema at patient's request     PATIENT EDUCATION PROVIDED: See AVS    Diagnosis and plan along with any newly prescribed medication(s) were discussed in detail with this patient today. The patient verbalized understanding and agreed with the plan. Patient advised if symptoms worsen return to clinic or ER.     Orders Placed This Encounter  Procedures  . CBC with Differential  . Iron, TIBC and Ferritin Panel  . Vitamin B12  . Folate  . TSH + free T4  . Ambulatory referral to Allergy    Referral Priority:   Routine    Referral Type:   Allergy Testing    Referral Reason:   Specialty Services Required    Requested Specialty:   Allergy    Number of Visits Requested:   1    Meds ordered this encounter  Medications  . hydrOXYzine (ATARAX/VISTARIL) 10 MG tablet    Sig: Take 1 tablet (10 mg total) by mouth 3 (three) times daily as needed.    Dispense:  30 tablet    Refill:  0  . fluconazole (DIFLUCAN) 150 MG tablet    Sig: Take 1 tablet (150 mg total)  by mouth once for 1 dose.    Dispense:  1 tablet    Refill:  0     Harolyn Rutherford, DO 03/08/2019, 2:29 PM PGY-3 [place]

## 2019-03-09 LAB — CBC WITH DIFFERENTIAL/PLATELET
Basophils Absolute: 0 10*3/uL (ref 0.0–0.2)
Basos: 0 %
EOS (ABSOLUTE): 0.2 10*3/uL (ref 0.0–0.4)
Eos: 3 %
Hematocrit: 36.1 % (ref 34.0–46.6)
Hemoglobin: 12.5 g/dL (ref 11.1–15.9)
Immature Grans (Abs): 0 10*3/uL (ref 0.0–0.1)
Immature Granulocytes: 0 %
Lymphocytes Absolute: 0.9 10*3/uL (ref 0.7–3.1)
Lymphs: 17 %
MCH: 27.5 pg (ref 26.6–33.0)
MCHC: 34.6 g/dL (ref 31.5–35.7)
MCV: 80 fL (ref 79–97)
Monocytes Absolute: 0.4 10*3/uL (ref 0.1–0.9)
Monocytes: 7 %
Neutrophils Absolute: 4 10*3/uL (ref 1.4–7.0)
Neutrophils: 73 %
Platelets: 347 10*3/uL (ref 150–450)
RBC: 4.54 x10E6/uL (ref 3.77–5.28)
RDW: 14.4 % (ref 11.7–15.4)
WBC: 5.4 10*3/uL (ref 3.4–10.8)

## 2019-03-09 LAB — IRON,TIBC AND FERRITIN PANEL
Ferritin: 17 ng/mL (ref 15–150)
Iron Saturation: 15 % (ref 15–55)
Iron: 51 ug/dL (ref 27–159)
Total Iron Binding Capacity: 351 ug/dL (ref 250–450)
UIBC: 300 ug/dL (ref 131–425)

## 2019-03-09 LAB — FOLATE: Folate: 9.5 ng/mL (ref >3.0)

## 2019-03-09 LAB — VITAMIN B12: Vitamin B-12: 273 pg/mL (ref 232–1245)

## 2019-03-09 LAB — TSH+FREE T4
Free T4: 0.96 ng/dL (ref 0.82–1.77)
TSH: 1.69 u[IU]/mL (ref 0.450–4.500)

## 2019-03-11 ENCOUNTER — Encounter: Payer: Self-pay | Admitting: Family Medicine

## 2019-03-11 DIAGNOSIS — B379 Candidiasis, unspecified: Secondary | ICD-10-CM | POA: Insufficient documentation

## 2019-03-11 DIAGNOSIS — L309 Dermatitis, unspecified: Secondary | ICD-10-CM | POA: Insufficient documentation

## 2019-03-11 NOTE — Assessment & Plan Note (Signed)
Ruling out organic causes but I do think her fatigue is more due to stress and anxiety that is poorly controlled - TSH/T4, CBC, Iron panel, B12, and Folate all WNL - Results called in to patient and letter sent

## 2019-03-11 NOTE — Assessment & Plan Note (Signed)
-   Diflucan 150mg  once

## 2019-03-11 NOTE — Progress Notes (Signed)
Red team to call patient and let her know all labs were stone cold normal. Sending letter with lab results for patient to have.

## 2019-03-11 NOTE — Assessment & Plan Note (Addendum)
-   Referral to allergy specialist for testing to see if any connection to her eczema at patient's request

## 2019-03-11 NOTE — Assessment & Plan Note (Signed)
Anxiety in my estimation is poorly controlled but directly due to her job and stress. - Recommended patient seek behavioral therapy to help reduce response to stress from work - Hydroxyzine prn for anxiety attacks; counseled not to drive or operate heavy machinery after taking - Offered to increase Celexa today but patient declines; she will call in one week to let me know how she is doing and if not better we will increase Celexa to 60mg  daily

## 2019-03-14 ENCOUNTER — Ambulatory Visit: Payer: Self-pay | Admitting: Allergy & Immunology

## 2019-03-18 ENCOUNTER — Telehealth: Payer: Self-pay | Admitting: *Deleted

## 2019-03-18 NOTE — Telephone Encounter (Signed)
Pt calls because she feels like the increase in celexa (40mg ) is causing her to feel tired, groggy, foggy headed and off balance the past week.   She increased celexa about one month ago.  After speaking with pt, she is not convinced it is the celexa since it has been one month.  She would like to speak with Dr. Sandi Carne to discuss.  To PCP. Christen Bame, CMA

## 2019-03-19 NOTE — Telephone Encounter (Signed)
Patient called to check on the status of her question regarding her Celexa medication.  Please call her as soon as you can with the answer for this, thank you.

## 2019-03-20 ENCOUNTER — Encounter: Payer: Self-pay | Admitting: Family Medicine

## 2019-03-20 NOTE — Telephone Encounter (Signed)
Spoke with patient this a.m. about medications.  She notes that she has been taking 30 mg of Celexa starting this morning.  She had been on 20 mg of Celexa since having her child in 2019 and recently increased in the last month to 40 mg.  She states that she has been feeling tired, foggy, "not myself."  Also endorses dry mouth.  States that she has been having a lot of stress with a new job and is trying to work through this.  States that she was supposed to be given resources for therapy, but does not think that she received these.  Discussed with patient continuing Celexa 30 mg for now and sent my chart message with therapist to take Medicaid.  Encourage patient to be taking medication and therapy for best benefit.  Sounds like she is having adjustment disorder, but also not tolerating increased dose of Celexa.  She is scheduled for an appointment on 11/20 with me in access to care, therefore we will follow-up on the symptoms at that time.  She denies SI.

## 2019-03-22 ENCOUNTER — Other Ambulatory Visit: Payer: Self-pay

## 2019-03-22 ENCOUNTER — Ambulatory Visit (INDEPENDENT_AMBULATORY_CARE_PROVIDER_SITE_OTHER): Payer: Medicaid Other | Admitting: Family Medicine

## 2019-03-22 VITALS — BP 118/72 | HR 90 | Wt 159.0 lb

## 2019-03-22 DIAGNOSIS — Z3009 Encounter for other general counseling and advice on contraception: Secondary | ICD-10-CM | POA: Diagnosis not present

## 2019-03-22 DIAGNOSIS — R5383 Other fatigue: Secondary | ICD-10-CM | POA: Diagnosis not present

## 2019-03-22 NOTE — Patient Instructions (Addendum)
Thank you for coming to see me today. It was a pleasure. Today we talked about:   Your mood: We will check another lab to make sure you aren't dehydrated.  Continue with the celexa at 30mg .  Call one of the therapists below.  Try to incorporate at least 30 minutes of exercise a day into your routine.  This can help with your mood, as well as your weight.  Please follow-up with me in 1 month or sooner as needed.  Make an appointment for a Pap smear as well.   If you have any questions or concerns, please do not hesitate to call the office at [phone].  Best,   Wanda Constable, DO   Therapy and Counseling Resources Most providers on this list will take Medicaid. Patients with commercial insurance or Medicare should contact their insurance company to get a list of in network providers.  [place] [address], [place], [place] [address]       [phone]     [facility] [address] [place], [place]    Jinny Blossom [facility] [address], [place], [place]  [facility]:  [place]. [place]  [facility]:  [place] [address] Loni [place], [place]  [facility] (under & uninsured) [address], [place] [phone]    [email or web address]    [facility] [place]     Phone:  [phone]     [place]  [place] [address]      [place], [place] ext [place]: [place], [place], [place]   [place] ([place] therapist) [address] [place] [address]   [place] [address]    [phone]    [facility]   [place],  [place], [place]   [place]  [place]  [place]  [facility]  [address] [place], [place]        [phone]  Open Access/Walk In Clinic under & uninsured [place],  [address] [place] [phone]):  Mon - Fri from 8 AM - 3 PM  [facility] [place],  ([place])   [place], [place]: [phone]) 8:30 - 12; 1 - 2:30  [facility] the [place],  [place], [place]    ([phone]):8:30 - 12; 2 - 3PM  [facility] [employer],  [address]  [place]; [phone]):   Mon - Fri 8 AM - 5 PM  [facility] [place]  MWF 12:30 to 3:00 or call to schedule an appointment  [phone]  Specific Provider options Psychology Today  [email or web address] 1. click on find a therapist  2. enter your zip code 3. left side and select or tailor a therapist for your specific need.   [facility] Provider Directory [email or web address]  (Medicaid)   Follow all drop down to find a provider  [place] or [email or web address] [address], [place], [place] Recovery support and educational   In home counseling [place] Telephone: [phone]  office in [place] [email or web address]  Does not take reg. Medicaid or Medicare private insurance [facility], [place] health Choice, [facility], [place], [place], [place], [place] Health Choice  24- Hour Availability:  . [place] or [phone]  . Family Service of the [employer] [phone]  [facility] Crisis [facility]  [phone]   . [place]  [phone] (after hours)  . Therapeutic Alternative/Mobile Crisis   [phone]  . [place] National Suicide Hotline  815-537-2425 ([place])  . Call 911 or go to emergency room  . [employer]   [phone]);  [place] and [employer]   . [facility] ACCESS  [phone]); [place], [place], [place], [place], [place], [place], [place]   Contraception Choices Contraception, also called birth control, refers to methods or devices that prevent pregnancy. Hormonal methods Contraceptive implant  A contraceptive implant is a thin, plastic tube that contains a hormone. It is inserted into the upper part of the arm. It can remain in place for up to 3 years. Progestin-only injections Progestin-only injections are injections of progestin, a synthetic form of the hormone progesterone. They are given every 3 months by a health care provider. Birth control pills  Birth control pills are pills that contain hormones that prevent pregnancy. They must be taken once a day, preferably at the same time each day. Birth control patch  The birth control patch contains hormones that prevent pregnancy. It is placed on the skin and must be changed once a week for three weeks and removed on the fourth week. A prescription is needed to use this method of contraception. Vaginal ring  A vaginal ring contains hormones that prevent pregnancy. It is placed in the vagina for three weeks and removed on the fourth week. After that, the process is repeated with a new ring. A prescription is needed to use this method of contraception. Emergency contraceptive Emergency contraceptives prevent pregnancy after unprotected sex. They come in pill form and can be taken up to 5 days after sex. They work best the sooner they are taken after having sex. Most emergency contraceptives are available without a prescription. This method should not be used as your only form of birth control. Barrier methods Female condom  A female condom is a thin sheath that is worn over the penis during sex. Condoms keep sperm from going inside a woman's body. They can be used with a spermicide to increase their effectiveness. They should be disposed  after a single use. Female condom  A female condom is a soft, loose-fitting sheath that is put into the vagina before sex. The condom keeps sperm from going inside a woman's body. They should be disposed after a single use. Diaphragm  A diaphragm is a soft, dome-shaped barrier. It is inserted into the vagina before sex, along with a spermicide. The diaphragm blocks sperm from entering the uterus, and the spermicide kills sperm. A diaphragm should be left in the vagina for 6-8 hours after sex and removed within 24 hours. A diaphragm is prescribed and fitted by a health care provider. A diaphragm should be replaced every 1-2 years, after giving birth, after gaining more than 15 lb (6.8 kg), and after pelvic surgery. Cervical cap  A cervical cap is a round, soft latex or plastic cup that fits over the cervix. It is inserted into the vagina before sex, along with spermicide. It blocks sperm from entering the uterus. The cap should be left in place for 6-8 hours after sex and removed within 48 hours. A cervical cap  must be prescribed and fitted by a health care provider. It should be replaced every 2 years. Sponge  A sponge is a soft, circular piece of polyurethane foam with spermicide on it. The sponge helps block sperm from entering the uterus, and the spermicide kills sperm. To use it, you make it wet and then insert it into the vagina. It should be inserted before sex, left in for at least 6 hours after sex, and removed and thrown away within 30 hours. Spermicides Spermicides are chemicals that kill or block sperm from entering the cervix and uterus. They can come as a cream, jelly, suppository, foam, or tablet. A spermicide should be inserted into the vagina with an applicator at least [id number] minutes before sex to allow time for it to work. The process must be repeated every time you have sex. Spermicides do not require a prescription. Intrauterine contraception Intrauterine device (IUD) An IUD is a  T-shaped device that is put in a woman's uterus. There are two types:  Hormone IUD.This type contains progestin, a synthetic form of the hormone progesterone. This type can stay in place for 3-5 years.  Copper IUD.This type is wrapped in copper wire. It can stay in place for 10 years.  Permanent methods of contraception Female tubal ligation In this method, a woman's fallopian tubes are sealed, tied, or blocked during surgery to prevent eggs from traveling to the uterus. Hysteroscopic sterilization In this method, a small, flexible insert is placed into each fallopian tube. The inserts cause scar tissue to form in the fallopian tubes and block them, so sperm cannot reach an egg. The procedure takes about 3 months to be effective. Another form of birth control must be used during those 3 months. Female sterilization This is a procedure to tie off the tubes that carry sperm (vasectomy). After the procedure, the man can still ejaculate fluid (semen). Natural planning methods Natural family planning In this method, a couple does not have sex on days when the woman could become pregnant. Calendar method This means keeping track of the length of each menstrual cycle, identifying the days when pregnancy can happen, and not having sex on those days. Ovulation method In this method, a couple avoids sex during ovulation. Symptothermal method This method involves not having sex during ovulation. The woman typically checks for ovulation by watching changes in her temperature and in the consistency of cervical mucus. Post-ovulation method In this method, a couple waits to have sex until after ovulation. Summary  Contraception, also called birth control, means methods or devices that prevent pregnancy.  Hormonal methods of contraception include implants, injections, pills, patches, vaginal rings, and emergency contraceptives.  Barrier methods of contraception can include female condoms, female condoms,  diaphragms, cervical caps, sponges, and spermicides.  There are two types of IUDs (intrauterine devices). An IUD can be put in a woman's uterus to prevent pregnancy for 3-5 years.  Permanent sterilization can be done through a procedure for males, females, or both.  Natural family planning methods involve not having sex on days when the woman could become pregnant. This information is not intended to replace advice given to you by your health care provider. Make sure you discuss any questions you have with your health care provider. Document Released: 04/18/2005 Document Revised: 04/20/2017 Document Reviewed: 05/21/2016 Elsevier Patient Education  2020 [employer].

## 2019-03-22 NOTE — Progress Notes (Signed)
Subjective: Chief Complaint  Patient presents with  . Follow-up    meds     HPI: Wanda Nixon is a 28 y.o. presenting to clinic today to discuss the following:  1 Anxiety and Possible Medication Side Effects Patient was seen on 11/6 by Dr. Garlan Fillers.  She was increased to 60 mg of Celexa as she was having increased anxiety, but since increasing it, she was having problems with feeling foggy, tired, dry mouth.  She decreased to 30 mg of Celexa about 4 days ago, and thinks that this is improving some of her symptoms.  She does note however that she has been chronically "foggy" off and on since having her last child in 2019.  She states "I have a lot of anxiety that something is wrong with me medically and that I just do not know."  Also notes that she feels as though her anxiety has been worsening since having children.  States that her weight has been increasing as well.  Notes that she is not currently on any form of birth control, because she did not like the way that it made her feel.  Only uses condoms for birth control.  States that she does not have a family history of anxiety.  Does note a stress at work, and is currently working to improve this.  States that she was having problems prior to this however.  She denies SI.  Patient also notes that prior to being pregnant and while being pregnant, she would have times when she would "feel foggy" and she would come to the doctor and be told that she was dehydrated, have IV fluids, and have improvement.  She is wondering if she is dehydrated now.    GAD 7 : Generalized Anxiety Score 03/22/2019 06/19/2018  Nervous, Anxious, on Edge 3 3  Control/stop worrying 2 3  Worry too much - different things 2 3  Trouble relaxing 2 3  Restless 2 3  Easily annoyed or irritable 2 2  Afraid - awful might happen 2 2  Total GAD 7 Score 15 19  Anxiety Difficulty Very difficult Somewhat difficult     Office Visit from 03/22/2019 in [place]  PHQ-9 Total Score  14     2 contraception Patient notes that she is not currently on any form of birth control aside from condoms, as she could not remember to take her medications and she did not like how it made her feel and she was on oral contraceptives.  She does not like the idea of having an IUD because it caused her pain previously and she does not like the idea of having a Nexplanon in her arm.  ROS noted in HPI. Chief complaint noted.  Other Pertinent PMH: Anxiety Past Medical, Surgical, Social, and Family History Reviewed & Updated per EMR.      Social History   Tobacco Use  Smoking Status Never Smoker  Smokeless Tobacco Never Used   Smoking status noted.    Objective: BP 118/72   Pulse 90   Wt 159 lb (72.1 kg)   LMP 06/15/2018   SpO2 99%   BMI 32.11 kg/m  Vitals and nursing notes reviewed  Physical Exam:  General: 28 y.o. female in NAD, smiling during encounter Lungs: Breathing comfortably on room air Skin: warm and dry Extremities: Ambulates without difficulty   No results found for this or any previous visit (from the past 72 hour(s)).  Assessment/Plan:  Fatigue Discussed with patient that her fatigue and fogginess could be secondary to her anxiety.  Note that Dr. Garlan Fillers provided a very extensive work-up for the patient at the last visit, including thyroid, without any findings.  Patient did not have a BMP and she is concerned that she is dehydrated, therefore will perform this today, although she does not appear to be obviously dehydrated on my exam and vital signs are stable.  She is not currently on any form of birth control, therefore I do not think this is affecting her mood or weight gain.  Provided patient with a list of Medicaid therapists, she stated that she cannot find it on her my chart.  Advised patient we will continue with Celexa 30 mg once daily.  If no improvement in the next month, can consider switching to another  medication.  Also encourage patient to exercise at least 30 minutes a day, as this will help improve her weight as well as mood and fatigue.  Encounter for counseling regarding contraception Discussed with patient options including patch, NuvaRing, Depo shot, IUD, Nexplanon, OCPs.  More information given to patient regarding contraception.  Patient to follow-up regarding her choice.     PATIENT EDUCATION PROVIDED: See AVS    Diagnosis and plan along with any newly prescribed medication(s) were discussed in detail with this patient today. The patient verbalized understanding and agreed with the plan. Patient advised if symptoms worsen return to clinic or ER.   Health Maintainance: Advised patient that she is due for a Pap, advised to come back for this.   Orders Placed This Encounter  Procedures  . Basic Metabolic Panel    No orders of the defined types were placed in this encounter.    [place] Constable, DO 03/22/2019, 6:04 PM PGY-2 [place]

## 2019-03-22 NOTE — Assessment & Plan Note (Addendum)
Discussed with patient options including patch, NuvaRing, Depo shot, IUD, Nexplanon, OCPs.  More information given to patient regarding contraception.  Patient to follow-up regarding her choice.

## 2019-03-22 NOTE — Assessment & Plan Note (Addendum)
Discussed with patient that her fatigue and fogginess could be secondary to her anxiety.  Note that Dr. Garlan Fillers provided a very extensive work-up for the patient at the last visit, including thyroid, without any findings.  Patient did not have a BMP and she is concerned that she is dehydrated, therefore will perform this today, although she does not appear to be obviously dehydrated on my exam and vital signs are stable.  She is not currently on any form of birth control, therefore I do not think this is affecting her mood or weight gain.  Provided patient with a list of Medicaid therapists, she stated that she cannot find it on her my chart.  Advised patient we will continue with Celexa 30 mg once daily.  If no improvement in the next month, can consider switching to another medication.  Also encourage patient to exercise at least 30 minutes a day, as this will help improve her weight as well as mood and fatigue.

## 2019-03-23 LAB — BASIC METABOLIC PANEL
BUN/Creatinine Ratio: 16 (ref 9–23)
BUN: 10 mg/dL (ref 6–20)
CO2: 23 mmol/L (ref 20–29)
Calcium: 9.2 mg/dL (ref 8.7–10.2)
Chloride: 104 mmol/L (ref 96–106)
Creatinine, Ser: 0.64 mg/dL (ref 0.57–1.00)
GFR calc Af Amer: 141 mL/min/{1.73_m2} (ref >59)
GFR calc non Af Amer: 123 mL/min/{1.73_m2} (ref >59)
Glucose: 115 mg/dL — ABNORMAL HIGH (ref 65–99)
Potassium: 4.4 mmol/L (ref 3.5–5.2)
Sodium: 141 mmol/L (ref 134–144)

## 2019-03-25 ENCOUNTER — Telehealth: Payer: Self-pay | Admitting: *Deleted

## 2019-03-25 NOTE — Telephone Encounter (Signed)
-----   Message from Wanda Dunker, DO sent at 03/25/2019  8:48 AM EST ----- Please inform patient that bloodwork was normal.

## 2019-03-25 NOTE — Telephone Encounter (Signed)
Pt informed of below.April Zimmerman Rumple, CMA ? ?

## 2019-03-26 ENCOUNTER — Telehealth: Payer: Self-pay | Admitting: Family Medicine

## 2019-03-26 NOTE — Telephone Encounter (Signed)
Patient seen on 11/6 by [facility] for yeast infection. In office notes it says Diflucan was supposed to be sent in, but it never was. Patient would like to know if she can get this sent to her pharmacy.

## 2019-04-04 ENCOUNTER — Ambulatory Visit: Payer: Self-pay | Admitting: Allergy & Immunology

## 2019-04-09 ENCOUNTER — Ambulatory Visit: Payer: Medicaid Other | Admitting: Family Medicine

## 2019-04-10 ENCOUNTER — Telehealth: Payer: Self-pay | Admitting: Family Medicine

## 2019-04-10 NOTE — Telephone Encounter (Signed)
Patient called to see if she could get a refill on her triamcinolone for her eczemas, and if she could get a larger supply. Please give pt a call back.

## 2019-04-11 ENCOUNTER — Other Ambulatory Visit: Payer: Self-pay | Admitting: Family Medicine

## 2019-04-11 DIAGNOSIS — L209 Atopic dermatitis, unspecified: Secondary | ICD-10-CM

## 2019-04-11 MED ORDER — TRIAMCINOLONE ACETONIDE 0.1 % EX CREA: 1.0000 "application " | TOPICAL_CREAM | Freq: Two times a day (BID) | CUTANEOUS | 3 refills | Status: DC | PRN

## 2019-04-11 NOTE — Progress Notes (Signed)
Refill sent for kenalog at patient request.

## 2019-04-11 NOTE — Telephone Encounter (Signed)
Rx sent for patient.

## 2019-04-15 NOTE — Telephone Encounter (Signed)
LVm to call office back to inform pt of below.[place]ana Deblasi Zimmerman Rumple, CMA

## 2019-05-07 ENCOUNTER — Telehealth (INDEPENDENT_AMBULATORY_CARE_PROVIDER_SITE_OTHER): Payer: Medicaid Other | Admitting: Family Medicine

## 2019-05-07 ENCOUNTER — Other Ambulatory Visit: Payer: Self-pay

## 2019-05-07 DIAGNOSIS — B373 Candidiasis of vulva and vagina: Secondary | ICD-10-CM

## 2019-05-07 DIAGNOSIS — B3731 Acute candidiasis of vulva and vagina: Secondary | ICD-10-CM

## 2019-05-07 MED ORDER — FLUCONAZOLE 150 MG PO TABS: 150.0000 mg | ORAL_TABLET | Freq: Once | ORAL | 0 refills | Status: DC

## 2019-05-07 MED ORDER — FLUCONAZOLE 150 MG PO TABS: ORAL_TABLET | ORAL | 0 refills | Status: DC

## 2019-05-09 ENCOUNTER — Ambulatory Visit: Payer: Medicaid Other | Admitting: Family Medicine

## 2019-05-10 ENCOUNTER — Telehealth (INDEPENDENT_AMBULATORY_CARE_PROVIDER_SITE_OTHER): Payer: Medicaid Other | Admitting: Family Medicine

## 2019-05-10 ENCOUNTER — Other Ambulatory Visit: Payer: Self-pay

## 2019-05-10 DIAGNOSIS — R5383 Other fatigue: Secondary | ICD-10-CM | POA: Diagnosis not present

## 2019-05-10 DIAGNOSIS — F411 Generalized anxiety disorder: Secondary | ICD-10-CM

## 2019-05-10 NOTE — Assessment & Plan Note (Signed)
No laboratory cause has been found for this.  Discussed with patient that my suspicion is that it is secondary to her uncontrolled anxiety.  It does not seem that she is having difficulty sleeping at night.  If she does endorse this in the future, can consider hydroxyzine.  See plan per above.

## 2019-05-10 NOTE — Progress Notes (Signed)
[facility] Tele[facility]icine Visit  Patient consented to have virtual visit. Method of visit: Video was attempted, but technology challenges prevented patient from using video, so visit was conducted via telephone.  Encounter participants: Patient: Wanda Nixon - located at work Provider: Cleophas Dunker - located at home Others (if applicable): none  Chief Complaint: Anxiety and Fatigue f/u  HPI: Wanda Nixon is a 29 y.o. female who presents to discuss the following:  Fatigue: Previously had lab work that was WNL.  Notes she is taking multivitamin and continues to feel fatigued.  Has worked out a few times, but not regularly.  Feels like when she gets home from work she has to force herself to be present with her kids.  Wants to sleep a lot.    Anxiety: Has been taking Celexa 30mg  due to adverse effects when increased to 60mg .  GAD-7 today 13, PHQ-9 score of 9.  Denies SI.  Notes that she has tried Zoloft and Lexapro in the past and didn't like the way they made her feel.  Overall thinks she is doing better, but still having anxiety during the day and says it makes her life very difficult.  Contributes more anxiety 2/2 COVID given financial stresses.  Work changed and benefits haven't kicked in yet so has not been able to get a therapist yet.  Doesn't endorse panic attacks or difficulty sleeping.  ROS: per HPI  Pertinent PMHx: Anxiety  Exam:  Respiratory: speaking in complete sentences, breathing comfortably on RA over the phone and small amount of time on video before connection failed  Assessment/Plan:  Generalized anxiety disorder Currently on Celexa 30 mg daily.  Has previously tried Zoloft and Lexapro and had side effects with those.  Also had side effects with increased dose of Celexa.  GAD-7 mildly improved from 15-13.  PHQ-9 also mildly improved today.  She is still having significant symptoms however that are affecting her daily life.  I  suspect that her anxiety not being well controlled is likely contributing to her fatigue.  Discussed with patient options including change in treatment, possibly changing to SNRI, but she opts to continue with her current regimen.  Encouraged her to obtain a therapist when she is able with her benefits.  Also spoke with patient extensively about the benefits of an appropriate exercise regimen and finding 20 to 30 minutes every day to exercise, as this can improve fatigue.  She voiced understanding of this.  We will follow-up in 2 to 3 months, or sooner if patient decides that it is needed or if she would like to have a change in therapy.  Could also consider addition of BuSpar during the day for patient's anxiety.  Fatigue No laboratory cause has been found for this.  Discussed with patient that my suspicion is that it is secondary to her uncontrolled anxiety.  It does not seem that she is having difficulty sleeping at night.  If she does endorse this in the future, can consider hydroxyzine.  See plan per above.    Time spent during visit with patient: 15 minutes

## 2019-05-10 NOTE — Assessment & Plan Note (Signed)
Currently on Celexa 30 mg daily.  Has previously tried Zoloft and Lexapro and had side effects with those.  Also had side effects with increased dose of Celexa.  GAD-7 mildly improved from 15-13.  PHQ-9 also mildly improved today.  She is still having significant symptoms however that are affecting her daily life.  I suspect that her anxiety not being well controlled is likely contributing to her fatigue.  Discussed with patient options including change in treatment, possibly changing to SNRI, but she opts to continue with her current regimen.  Encouraged her to obtain a therapist when she is able with her benefits.  Also spoke with patient extensively about the benefits of an appropriate exercise regimen and finding 20 to 30 minutes every day to exercise, as this can improve fatigue.  She voiced understanding of this.  We will follow-up in 2 to 3 months, or sooner if patient decides that it is needed or if she would like to have a change in therapy.  Could also consider addition of BuSpar during the day for patient's anxiety.

## 2019-05-10 NOTE — Progress Notes (Signed)
[place] Telemedicine Visit  Patient consented to have virtual visit. Method of visit: Video was attempted, but technology challenges prevented patient from using video, so visit was conducted via telephone.  Encounter participants: Patient: Wanda Nixon - located at home Provider: Guadalupe Dawn - [facility] at [facility]  Chief Complaint: Vaginal itching  HPI: 29 year old presents with a telemedicine visit for yeast infection.  She was seen on 12/10 and was diagnosed with a yeast infection given her pruritus, redness.  States that the physician was going to send in [place] but it never arrived at her pharmacy.  She is going to get this refilled.  She takes oral contraceptive pills.  ROS: per HPI  Pertinent PMHx: none  Exam:  General: Very pleasant, no distress Respiratory: No wheezing audible over phone, no respiratory distress Psych: No tangential thought process, very pleasant  Assessment/Plan:  Yeast vaginitis Confirmed pharmacy with patient and send Diflucan 150 mg x 1, with a refill for a second dose to be 3 days later if still having symptoms.    Time spent during visit with patient: 7 minutes

## 2019-05-13 DIAGNOSIS — B3731 Acute candidiasis of vulva and vagina: Secondary | ICD-10-CM | POA: Insufficient documentation

## 2019-05-13 DIAGNOSIS — B373 Candidiasis of vulva and vagina: Secondary | ICD-10-CM | POA: Insufficient documentation

## 2019-05-13 NOTE — Assessment & Plan Note (Signed)
Confirmed pharmacy with patient and send Diflucan 150 mg x 1, with a refill for a second dose to be 3 days later if still having symptoms.

## 2019-05-16 ENCOUNTER — Ambulatory Visit: Payer: Medicaid Other | Admitting: Family Medicine

## 2019-06-20 ENCOUNTER — Ambulatory Visit: Payer: Medicaid Other | Admitting: Allergy & Immunology

## 2019-06-27 ENCOUNTER — Ambulatory Visit: Payer: Medicaid Other | Admitting: Allergy

## 2019-06-27 ENCOUNTER — Other Ambulatory Visit: Payer: Self-pay

## 2019-06-27 ENCOUNTER — Telehealth: Payer: Medicaid Other

## 2019-06-27 NOTE — Progress Notes (Deleted)
New Patient Note  RE: Wanda Nixon MRN: [id number] DOB: 06/25/1990 Date of Office Visit: 06/27/2019  Referring provider: Leeanne Rio, MD Primary care provider: Cleophas Dunker, DO  Chief Complaint: No chief complaint on file.  History of Present Illness: I had the pleasure of seeing Wanda Nixon for initial evaluation at the [facility] [place] [facility] [place] on 06/27/2019. She is a 29 y.o. female, who is referred here by Cleophas Dunker, DO for the evaluation of eczema.  Rash: Rash started about *** ago. Mainly occurs on her ***. Describes them as ***. Individual rashes lasts about ***. No ecchymosis upon resolution. Associated symptoms include: ***. Suspected triggers are ***. Denies any *** fevers, chills, changes in medications, foods, personal care products or recent infections. She has tried the following therapies: *** with *** benefit. Systemic steroids ***. Currently on ***.  Previous work up includes: ***. Previous history of rash/hives: ***. Patient is up to date with the following cancer screening tests: ***.  Assessment and Plan: Wanda Nixon is a 29 y.o. female with: No problem-specific Assessment & Plan notes found for this encounter.  No follow-ups on file.  No orders of the defined types were placed in this encounter.  Lab Orders  No laboratory test(s) ordered today    Other allergy screening: Asthma: {Blank single:19197::"yes","no"} Rhino conjunctivitis: {Blank single:19197::"yes","no"} Food allergy: {Blank single:19197::"yes","no"} Medication allergy: {Blank single:19197::"yes","no"} Hymenoptera allergy: {Blank single:19197::"yes","no"} Urticaria: {Blank single:19197::"yes","no"} Eczema:{Blank single:19197::"yes","no"} History of recurrent infections suggestive of immunodeficency: {Blank single:19197::"yes","no"}  Diagnostics: Spirometry:  Tracings reviewed. Her effort: {Blank single:19197::"Good reproducible efforts.","It was hard to  get consistent efforts and there is a question as to whether this reflects a maximal maneuver.","Poor effort, data can not be interpreted."} FVC: ***L FEV1: ***L, ***% predicted FEV1/FVC ratio: ***% Interpretation: {Blank single:19197::"Spirometry consistent with mild obstructive disease","Spirometry consistent with moderate obstructive disease","Spirometry consistent with severe obstructive disease","Spirometry consistent with possible restrictive disease","Spirometry consistent with mixed obstructive and restrictive disease","Spirometry uninterpretable due to technique","Spirometry consistent with normal pattern","No overt abnormalities noted given today's efforts"}.  Please see scanned spirometry results for details.  Skin Testing: {Blank single:19197::"Select foods","Environmental allergy panel","Environmental allergy panel and select foods","Food allergy panel","None","Deferred due to recent antihistamines use"}. Positive test to: ***. Negative test to: ***.  Results discussed with patient/family.   Past Medical History: Patient Active Problem List   Diagnosis Date Noted  . Yeast vaginitis 05/13/2019  . Eczema 03/11/2019  . Fatigue 03/08/2019  . Generalized anxiety disorder 06/21/2018  . Asthma 02/26/2016  . Migraine, unspecified, without mention of intractable migraine without mention of status migrainosus 10/25/2013   Past Medical History:  Diagnosis Date  . Anemia   . Asthma    Last used 08/30/14; weekly inhaler use, used inhaler 2 weeks ago  . Cholestasis of pregnancy in third trimester 07/25/2015  . Depression    celexa off for several months  . Eczema   . Headache(784.0)    migraines  . NST (non-stress test) nonreactive   . SVD (spontaneous vaginal delivery) 07/26/2015   Past Surgical History: Past Surgical History:  Procedure Laterality Date  . MOUTH SURGERY    . TYMPANOSTOMY TUBE PLACEMENT    . WISDOM TOOTH EXTRACTION     Medication List:  Current Outpatient  Medications  Medication Sig Dispense Refill  . albuterol (VENTOLIN HFA) 108 (90 Base) MCG/ACT inhaler Inhale 2 puffs into the lungs every 6 (six) hours as needed for wheezing or shortness of breath. 1 Inhaler 3  . butalbital-acetaminophen-caffeine (FIORICET) 50-325-40 MG  tablet Take 1-2 tablets by mouth every 6 (six) hours as needed for headache. 20 tablet 0  . citalopram (CELEXA) 20 MG tablet Take 2 tablets (40 mg total) by mouth daily. 90 tablet 1  . Doxylamine-Pyridoxine 10-10 MG TBEC Take 2 tablets by mouth at bedtime. May take additional tablet in the morning if nausea not well controlled. 60 tablet 0  . fluconazole (DIFLUCAN) 150 MG tablet Take 1 tablet (150mg ) once. If still having symptoms, please take the 2nd dose in three days. 2 tablet 0  . folic acid (FOLVITE) 1 MG tablet Take 1 tablet (1 mg total) by mouth daily. 30 tablet 0  . hydrOXYzine (ATARAX/VISTARIL) 10 MG tablet Take 1 tablet (10 mg total) by mouth 3 (three) times daily as needed. 30 tablet 0  . loratadine (CLARITIN) 10 MG tablet Take 1 tablet (10 mg total) by mouth daily. 30 tablet 11  . norethindrone-ethinyl estradiol (LOESTRIN) 1-20 MG-MCG tablet Take 1 tablet by mouth daily. 1 Package 11  . Prenatal Vit-Fe Fumarate-FA (PRENATAL VITAMIN) 27-0.8 MG TABS Take 1 tablet by mouth daily. 90 tablet 1  . triamcinolone cream (KENALOG) 0.1 % Apply 1 application topically 2 (two) times daily as needed (rash/itching). 80 g 3   No current facility-administered medications for this visit.   Allergies: Allergies  Allergen Reactions  . Pulmicort [Budesonide] Palpitations  . Tomato Hives and Itching  . Mushroom Extract Complex Hives and Itching  . Reglan [Metoclopramide] Anxiety    Pt reports makes her anxiety worse.    Social History: Social History   Socioeconomic History  . Marital status: Married    Spouse name: Sedalia Muta  . Number of children: 2  . Years of education: college  . Highest education level: Not on file   Occupational History  . Occupation: Brewing technologist  Tobacco Use  . Smoking status: Never Smoker  . Smokeless tobacco: Never Used  Substance and Sexual Activity  . Alcohol use: No  . Drug use: No  . Sexual activity: Yes    Birth control/protection: None  Other Topics Concern  . Not on file  Social History Narrative   Patient lives at home with her husband and their 2 children.    Education two years of college.   Right handed.   Caffeine - None    Social Determinants of Health   Financial Resource Strain:   . Difficulty of Paying Living Expenses: Not on file  Food Insecurity:   . Worried About Charity fundraiser in the Last Year: Not on file  . Ran Out of Food in the Last Year: Not on file  Transportation Needs:   . Lack of Transportation (Medical): Not on file  . Lack of Transportation (Non-Medical): Not on file  Physical Activity:   . Days of Exercise per Week: Not on file  . Minutes of Exercise per Session: Not on file  Stress:   . Feeling of Stress : Not on file  Social Connections:   . Frequency of Communication with Friends and Family: Not on file  . Frequency of Social Gatherings with Friends and Family: Not on file  . Attends Religious Services: Not on file  . Active Member of Clubs or Organizations: Not on file  . Attends Archivist Meetings: Not on file  . Marital Status: Not on file   Lives in a ***. Smoking: *** Occupation: ***  Environmental History: Water Damage/mildew in the house: {Blank single:19197::"yes","no"} Carpet in the family room: {Blank single:19197::"yes","no"}  Carpet in the bedroom: {Blank single:19197::"yes","no"} Heating: {Blank single:19197::"electric","gas"} Cooling: {Blank single:19197::"central","window"} Pet: {Blank single:19197::"yes ***","no"}  Family History: Family History  Problem Relation Age of Onset  . Diabetes Maternal Grandmother   . Hypertension Maternal Grandmother   . Stroke Maternal Grandmother    . Hypertension Mother    Problem                               Relation Asthma                                   *** Eczema                                *** Food allergy                          *** Allergic rhino conjunctivitis     ***  Review of Systems  Constitutional: Negative for appetite change, chills, fever and unexpected weight change.  HENT: Negative for congestion and rhinorrhea.   Eyes: Negative for itching.  Respiratory: Negative for cough, chest tightness, shortness of breath and wheezing.   Cardiovascular: Negative for chest pain.  Gastrointestinal: Negative for abdominal pain.  Genitourinary: Negative for difficulty urinating.  Skin: Negative for rash.  Allergic/Immunologic: Negative for environmental allergies and food allergies.  Neurological: Negative for headaches.   Objective: There were no vitals taken for this visit. There is no height or weight on file to calculate BMI. Physical Exam  Constitutional: She is oriented to person, place, and time. She appears well-developed and well-nourished.  HENT:  Head: Normocephalic and atraumatic.  Right Ear: External ear normal.  Left Ear: External ear normal.  Nose: Nose normal.  Mouth/Throat: Oropharynx is clear and moist.  Eyes: Conjunctivae and EOM are normal.  Cardiovascular: Normal rate, regular rhythm and normal heart sounds. Exam reveals no gallop and no friction rub.  No murmur heard. Pulmonary/Chest: Effort normal and breath sounds normal. She has no wheezes. She has no rales.  Abdominal: Soft.  Musculoskeletal:     Cervical back: Neck supple.  Neurological: She is alert and oriented to person, place, and time.  Skin: Skin is warm. No rash noted.  Psychiatric: She has a normal mood and affect. Her behavior is normal.  Nursing note and vitals reviewed.  The plan was reviewed with the patient/family, and all questions/concerned were addressed.  It was my pleasure to see Wanda Nixon today and participate  in her care. Please feel free to contact me with any questions or concerns.  Sincerely,  Rexene Alberts, DO Allergy & Immunology  [facility] office: [phone] [facility] office: [place] office: [phone]

## 2019-06-30 IMAGING — US US ART/VEN ABD/PELV/SCROTUM DOPPLER LTD
1 series · 13 of 25 positions shown · non-contrast
Comparison: None.

CLINICAL DATA: Low back pain radiating to pelvis

EXAM:
TRANSABDOMINAL AND TRANSVAGINAL ULTRASOUND OF PELVIS
DOPPLER ULTRASOUND OF OVARIES
TECHNIQUE: Both transabdominal and transvaginal ultrasound examinations of the
pelvis were performed. Transabdominal technique was performed for
global imaging of the pelvis including uterus, ovaries, adnexal
regions, and pelvic cul-de-sac.
It was necessary to proceed with endovaginal exam following the
transabdominal exam to visualize the uterus, endometrium, ovaries
and adnexa. Color and duplex Doppler ultrasound was utilized to
evaluate blood flow to the ovaries.

[Series 1: us art/ven abd/pelv/scrotum doppler ltd · 0.20mm/px · 13 of 57 slices shown]
[im 1/57]
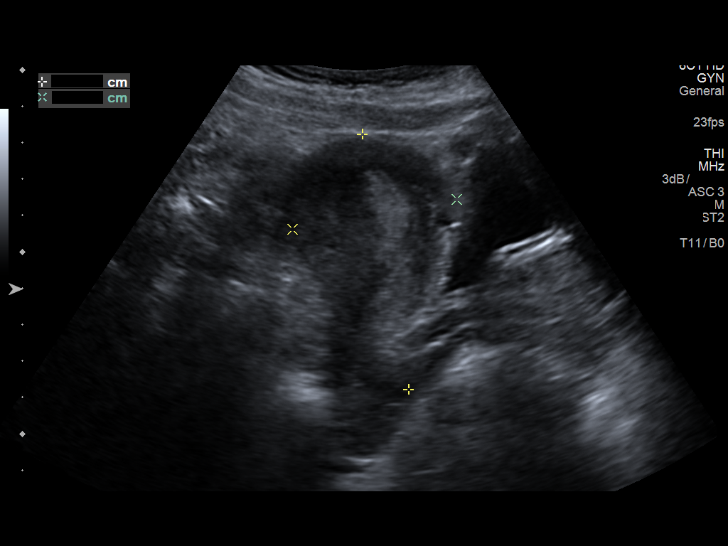
[im 5/57]
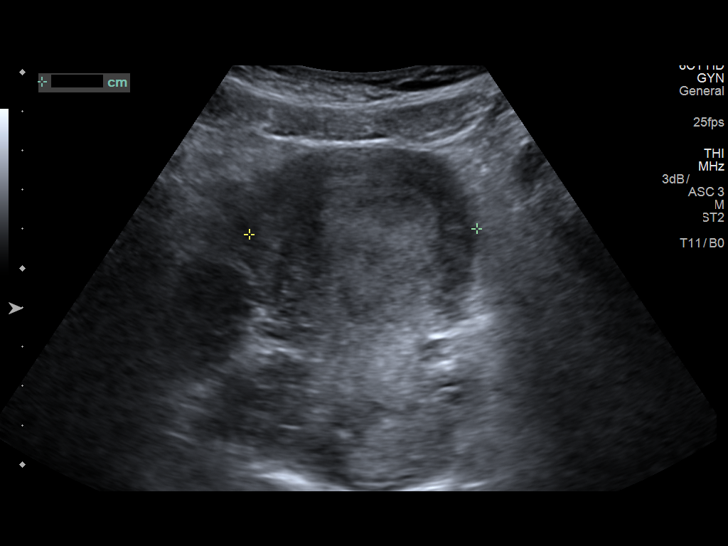
[im 10/57]
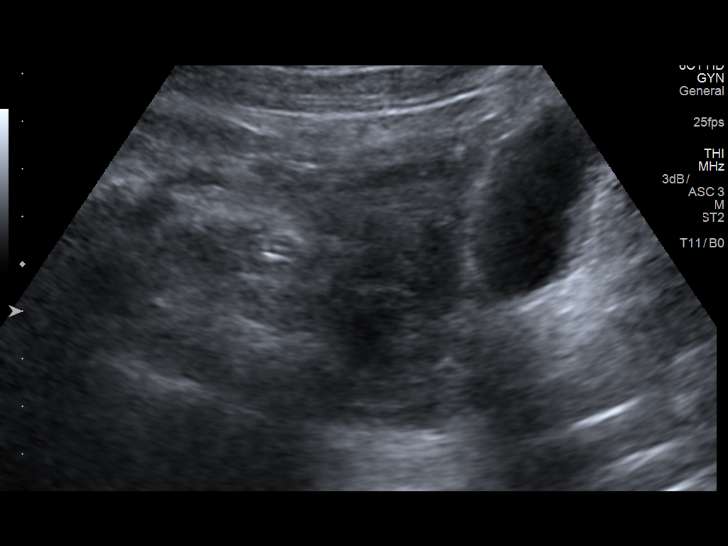
[im 15/57]
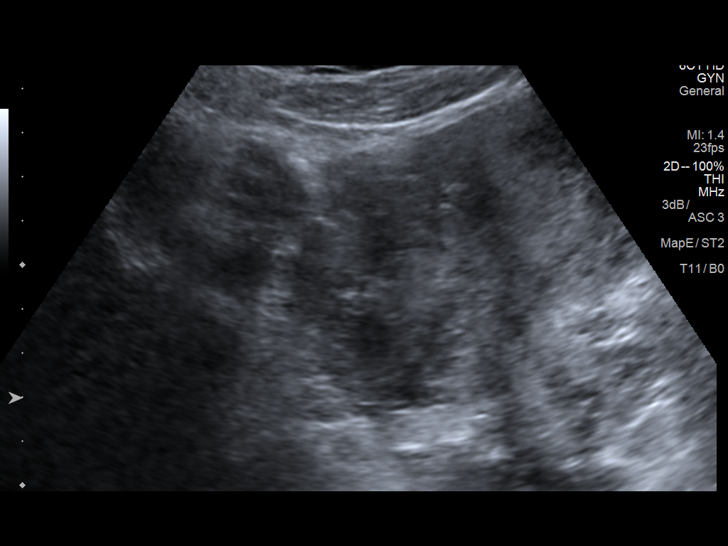
[im 19/57]
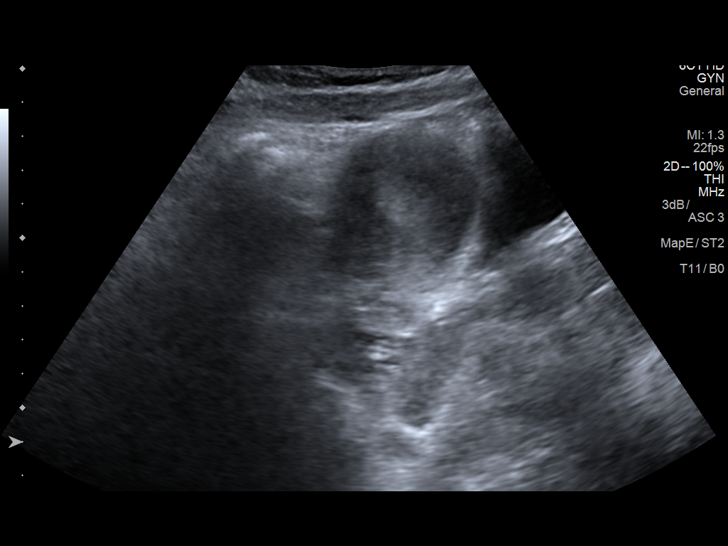
[im 24/57]
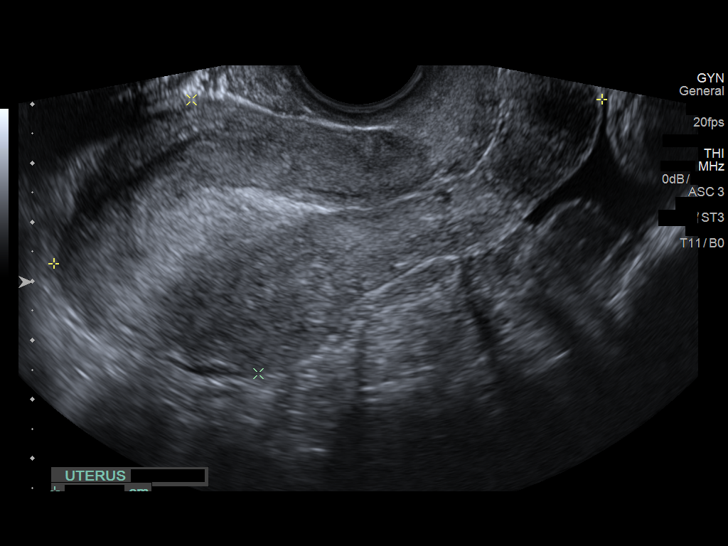
[im 29/57]
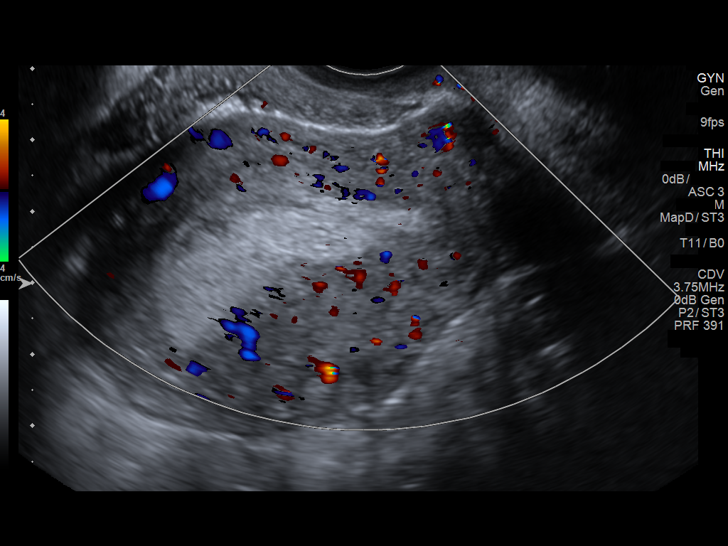
[im 33/57]
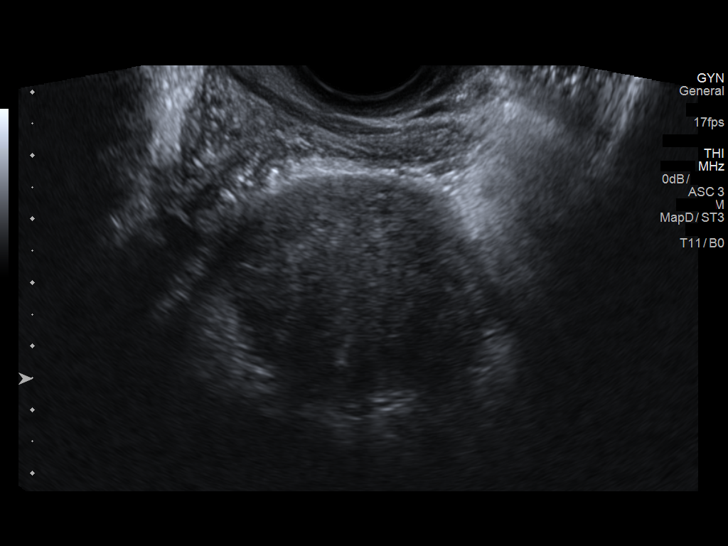
[im 38/57]
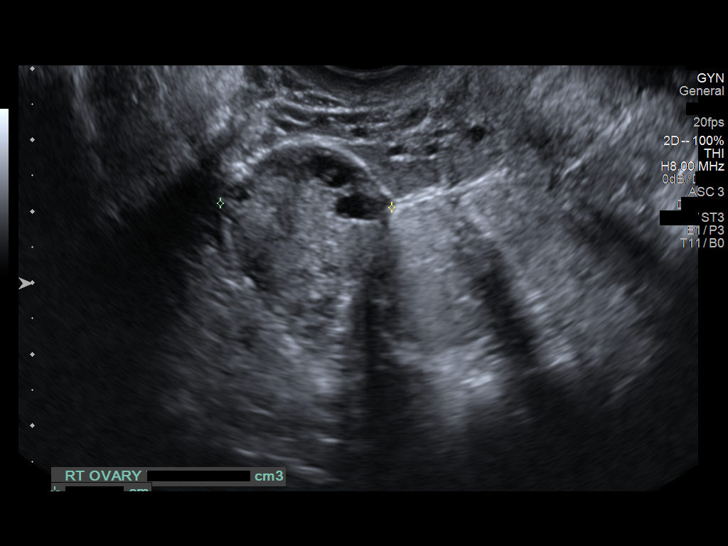
[im 43/57]
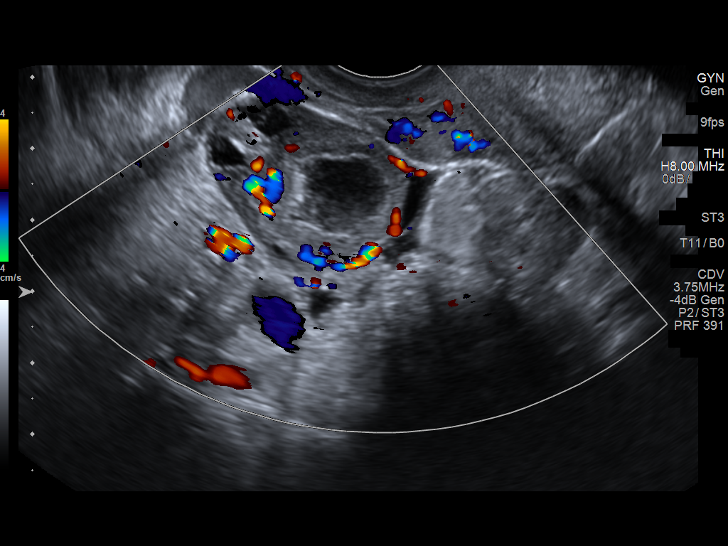
[im 47/57]
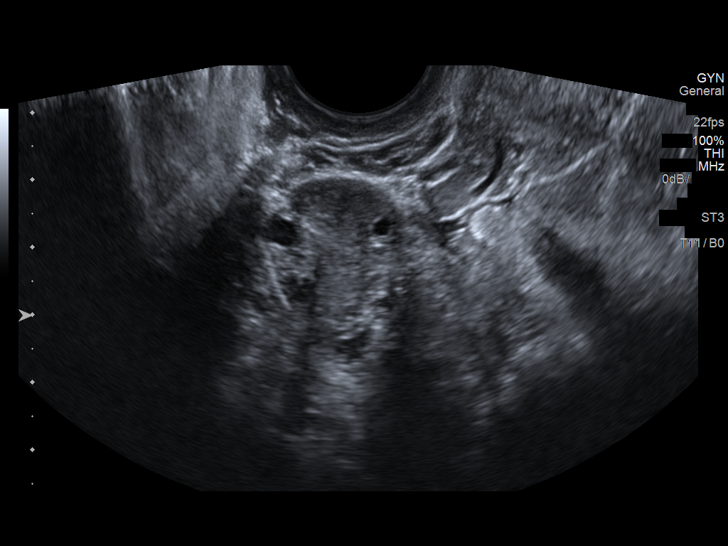
[im 52/57]
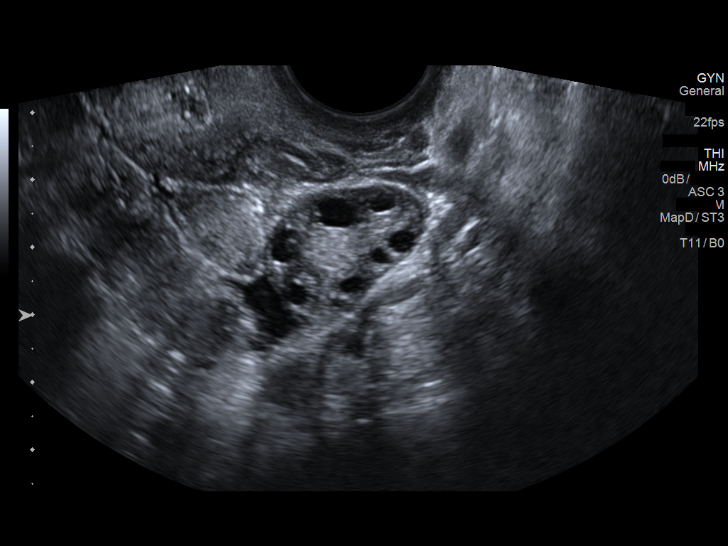
[im 57/57]
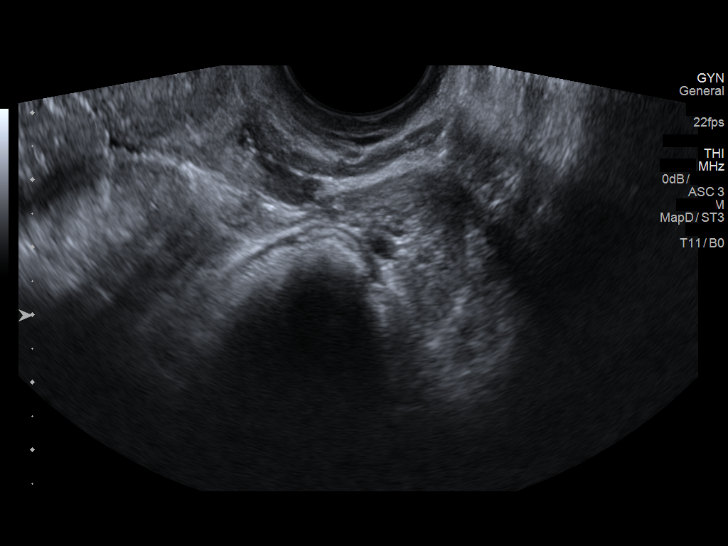

[13 of 25 positions shown; findings below may reference images not displayed]

FINDINGS: Uterus

Measurements: 9.7 x 4.8 x 6.0 cm. No fibroids or other mass
visualized.

Endometrium

Thickness: 16 mm in thickness.  No focal abnormality visualized.

Right ovary

Measurements: 4.3 x 1.9 x 2.4 cm. Multiple small follicles.
Collapsing follicle or cyst measures up to 1.6 cm..

Left ovary

Measurements: 3.4 x 1.9 x 2.3 cm. Multiple small follicles. Normal
appearance. No adnexal mass.

Pulsed Doppler evaluation of both ovaries demonstrates normal
low-resistance arterial and venous waveforms.

Other findings

Moderate free fluid in the pelvis.
IMPRESSION: Moderate free fluid in the pelvis. Small collapsing cyst or follicle
in the right ovary, 1.6 cm.

No evidence of ovarian torsion.

## 2019-07-04 ENCOUNTER — Ambulatory Visit: Payer: Medicaid Other | Admitting: Allergy

## 2019-07-04 NOTE — Progress Notes (Deleted)
New Patient Note  RE: Wanda Nixon MRN: [id number] DOB: Sep 20, 1990 Date of Office Visit: 07/04/2019  Referring provider: Leeanne Rio, MD Primary care provider: Cleophas Dunker, DO  Chief Complaint: No chief complaint on file.  History of Present Illness: I had the pleasure of seeing Wanda Nixon for initial evaluation at the [facility] [place] [facility] [place] on 07/04/2019. She is a 29 y.o. female, who is referred here by Cleophas Dunker, DO for the evaluation of eczema.  Rash started about *** ago. Mainly occurs on her ***. Describes them as ***. Individual rashes lasts about ***. No ecchymosis upon resolution. Associated symptoms include: ***. Suspected triggers are ***. Denies any *** fevers, chills, changes in medications, foods, personal care products or recent infections. She has tried the following therapies: *** with *** benefit. Systemic steroids ***. Currently on ***.  Previous work up includes: ***. Previous history of rash/hives: ***. Patient is up to date with the following cancer screening tests: ***.  Eczema Patient requesting referral to allergy specialist for skin testing as she is having recurrent episodes of eczema.  Assessment and Plan: Wanda Nixon is a 29 y.o. female with: No problem-specific Assessment & Plan notes found for this encounter.  No follow-ups on file.  No orders of the defined types were placed in this encounter.  Lab Orders  No laboratory test(s) ordered today    Other allergy screening: Asthma: {Blank single:19197::"yes","no"} Rhino conjunctivitis: {Blank single:19197::"yes","no"} Food allergy: {Blank single:19197::"yes","no"} Medication allergy: {Blank single:19197::"yes","no"} Hymenoptera allergy: {Blank single:19197::"yes","no"} Urticaria: {Blank single:19197::"yes","no"} Eczema:{Blank single:19197::"yes","no"} History of recurrent infections suggestive of immunodeficency: {Blank  single:19197::"yes","no"}  Diagnostics: Spirometry:  Tracings reviewed. Her effort: {Blank single:19197::"Good reproducible efforts.","It was hard to get consistent efforts and there is a question as to whether this reflects a maximal maneuver.","Poor effort, data can not be interpreted."} FVC: ***L FEV1: ***L, ***% predicted FEV1/FVC ratio: ***% Interpretation: {Blank single:19197::"Spirometry consistent with mild obstructive disease","Spirometry consistent with moderate obstructive disease","Spirometry consistent with severe obstructive disease","Spirometry consistent with possible restrictive disease","Spirometry consistent with mixed obstructive and restrictive disease","Spirometry uninterpretable due to technique","Spirometry consistent with normal pattern","No overt abnormalities noted given today's efforts"}.  Please see scanned spirometry results for details.  Skin Testing: {Blank single:19197::"Select foods","Environmental allergy panel","Environmental allergy panel and select foods","Food allergy panel","None","Deferred due to recent antihistamines use"}. Positive test to: ***. Negative test to: ***.  Results discussed with patient/family.   Past Medical History: Patient Active Problem List   Diagnosis Date Noted  . Yeast vaginitis 05/13/2019  . Eczema 03/11/2019  . Fatigue 03/08/2019  . Generalized anxiety disorder 06/21/2018  . Asthma 02/26/2016  . Migraine, unspecified, without mention of intractable migraine without mention of status migrainosus 10/25/2013   Past Medical History:  Diagnosis Date  . Anemia   . Asthma    Last used 08/30/14; weekly inhaler use, used inhaler 2 weeks ago  . Cholestasis of pregnancy in third trimester 07/25/2015  . Depression    celexa off for several months  . Eczema   . Headache(784.0)    migraines  . NST (non-stress test) nonreactive   . SVD (spontaneous vaginal delivery) 07/26/2015   Past Surgical History: Past Surgical History:   Procedure Laterality Date  . MOUTH SURGERY    . TYMPANOSTOMY TUBE PLACEMENT    . WISDOM TOOTH EXTRACTION     Medication List:  Current Outpatient Medications  Medication Sig Dispense Refill  . albuterol (VENTOLIN HFA) 108 (90 Base) MCG/ACT inhaler Inhale 2 puffs into the lungs every 6 (six)  hours as needed for wheezing or shortness of breath. 1 Inhaler 3  . butalbital-acetaminophen-caffeine (FIORICET) 50-325-40 MG tablet Take 1-2 tablets by mouth every 6 (six) hours as needed for headache. 20 tablet 0  . citalopram (CELEXA) 20 MG tablet Take 2 tablets (40 mg total) by mouth daily. 90 tablet 1  . Doxylamine-Pyridoxine 10-10 MG TBEC Take 2 tablets by mouth at bedtime. May take additional tablet in the morning if nausea not well controlled. 60 tablet 0  . fluconazole (DIFLUCAN) 150 MG tablet Take 1 tablet (150mg ) once. If still having symptoms, please take the 2nd dose in three days. 2 tablet 0  . folic acid (FOLVITE) 1 MG tablet Take 1 tablet (1 mg total) by mouth daily. 30 tablet 0  . hydrOXYzine (ATARAX/VISTARIL) 10 MG tablet Take 1 tablet (10 mg total) by mouth 3 (three) times daily as needed. 30 tablet 0  . loratadine (CLARITIN) 10 MG tablet Take 1 tablet (10 mg total) by mouth daily. 30 tablet 11  . norethindrone-ethinyl estradiol (LOESTRIN) 1-20 MG-MCG tablet Take 1 tablet by mouth daily. 1 Package 11  . Prenatal Vit-Fe Fumarate-FA (PRENATAL VITAMIN) 27-0.8 MG TABS Take 1 tablet by mouth daily. 90 tablet 1  . triamcinolone cream (KENALOG) 0.1 % Apply 1 application topically 2 (two) times daily as needed (rash/itching). 80 g 3   No current facility-administered medications for this visit.   Allergies: Allergies  Allergen Reactions  . Pulmicort [Budesonide] Palpitations  . Tomato Hives and Itching  . Mushroom Extract Complex Hives and Itching  . Reglan [Metoclopramide] Anxiety    Pt reports makes her anxiety worse.    Social History: Social History   Socioeconomic History  .  Marital status: Married    Spouse name: Sedalia Muta  . Number of children: 2  . Years of education: college  . Highest education level: Not on file  Occupational History  . Occupation: Brewing technologist  Tobacco Use  . Smoking status: Never Smoker  . Smokeless tobacco: Never Used  Substance and Sexual Activity  . Alcohol use: No  . Drug use: No  . Sexual activity: Yes    Birth control/protection: None  Other Topics Concern  . Not on file  Social History Narrative   Patient lives at home with her husband and their 2 children.    Education two years of college.   Right handed.   Caffeine - None    Social Determinants of Health   Financial Resource Strain:   . Difficulty of Paying Living Expenses: Not on file  Food Insecurity:   . Worried About Charity fundraiser in the Last Year: Not on file  . Ran Out of Food in the Last Year: Not on file  Transportation Needs:   . Lack of Transportation (Medical): Not on file  . Lack of Transportation (Non-Medical): Not on file  Physical Activity:   . Days of Exercise per Week: Not on file  . Minutes of Exercise per Session: Not on file  Stress:   . Feeling of Stress : Not on file  Social Connections:   . Frequency of Communication with Friends and Family: Not on file  . Frequency of Social Gatherings with Friends and Family: Not on file  . Attends Religious Services: Not on file  . Active Member of Clubs or Organizations: Not on file  . Attends Archivist Meetings: Not on file  . Marital Status: Not on file   Lives in a ***. Smoking: *** Occupation: ***  Environmental History: Water Damage/mildew in the house: Estate agent in the family room: {Blank single:19197::"yes","no"} Carpet in the bedroom: {Blank single:19197::"yes","no"} Heating: {Blank single:19197::"electric","gas"} Cooling: {Blank single:19197::"central","window"} Pet: {Blank single:19197::"yes ***","no"}  Family  History: Family History  Problem Relation Age of Onset  . Diabetes Maternal Grandmother   . Hypertension Maternal Grandmother   . Stroke Maternal Grandmother   . Hypertension Mother    Problem                               Relation Asthma                                   *** Eczema                                *** Food allergy                          *** Allergic rhino conjunctivitis     ***  Review of Systems  Constitutional: Negative for appetite change, chills, fever and unexpected weight change.  HENT: Negative for congestion and rhinorrhea.   Eyes: Negative for itching.  Respiratory: Negative for cough, chest tightness, shortness of breath and wheezing.   Cardiovascular: Negative for chest pain.  Gastrointestinal: Negative for abdominal pain.  Genitourinary: Negative for difficulty urinating.  Skin: Negative for rash.  Allergic/Immunologic: Negative for environmental allergies and food allergies.  Neurological: Negative for headaches.   Objective: There were no vitals taken for this visit. There is no height or weight on file to calculate BMI. Physical Exam  Constitutional: She is oriented to person, place, and time. She appears well-developed and well-nourished.  HENT:  Head: Normocephalic and atraumatic.  Right Ear: External ear normal.  Left Ear: External ear normal.  Nose: Nose normal.  Mouth/Throat: Oropharynx is clear and moist.  Eyes: Conjunctivae and EOM are normal.  Cardiovascular: Normal rate, regular rhythm and normal heart sounds. Exam reveals no gallop and no friction rub.  No murmur heard. Pulmonary/Chest: Effort normal and breath sounds normal. She has no wheezes. She has no rales.  Abdominal: Soft.  Musculoskeletal:     Cervical back: Neck supple.  Neurological: She is alert and oriented to person, place, and time.  Skin: Skin is warm. No rash noted.  Psychiatric: She has a normal mood and affect. Her behavior is normal.  Nursing note and vitals  reviewed.  The plan was reviewed with the patient/family, and all questions/concerned were addressed.  It was my pleasure to see Wanda Nixon today and participate in her care. Please feel free to contact me with any questions or concerns.  Sincerely,  Rexene Alberts, DO Allergy & Immunology  [facility] office: [phone] [facility] office: [place] office: [phone]

## 2019-07-12 ENCOUNTER — Other Ambulatory Visit: Payer: Self-pay | Admitting: *Deleted

## 2019-07-13 MED ORDER — CITALOPRAM HYDROBROMIDE 20 MG PO TABS: 40.0000 mg | ORAL_TABLET | Freq: Every day | ORAL | 3 refills | Status: DC

## 2019-07-15 ENCOUNTER — Telehealth: Payer: Self-pay

## 2019-07-15 ENCOUNTER — Other Ambulatory Visit: Payer: Self-pay | Admitting: Family Medicine

## 2019-07-15 DIAGNOSIS — F411 Generalized anxiety disorder: Secondary | ICD-10-CM

## 2019-07-15 MED ORDER — CITALOPRAM HYDROBROMIDE 20 MG PO TABS: 30.0000 mg | ORAL_TABLET | Freq: Every day | ORAL | 2 refills | Status: DC

## 2019-07-15 NOTE — Telephone Encounter (Signed)
Patient calls nurse line with issues regarding Celexa. Patient states that she is supposed to be taking 30mg  Celexa, daily. However, rx is written for patient to receive 40mg , 2 20 mg tablets daily. Per pharmacy, insurance was not covering 20 mg tablets, so rx was filled for 40 mg tablets. Pharmacist states that medication will need to be written for 30mg  (1.5 20mg  tablets, daily).   To PCP  Please advise  Talbot Grumbling, RN

## 2019-07-15 NOTE — Telephone Encounter (Signed)
Order for celexa 30mg  sent to [facility] on [address]

## 2019-07-15 NOTE — Telephone Encounter (Signed)
Pt is stating she is out of meds and has been since Friday. Shes saying it is URGENT because if she doesn't take it she gets headaches. Please contact pt. Thanks

## 2019-07-30 ENCOUNTER — Telehealth (INDEPENDENT_AMBULATORY_CARE_PROVIDER_SITE_OTHER): Payer: Medicaid Other | Admitting: Family Medicine

## 2019-07-30 ENCOUNTER — Other Ambulatory Visit: Payer: Self-pay

## 2019-07-30 DIAGNOSIS — R1013 Epigastric pain: Secondary | ICD-10-CM | POA: Diagnosis not present

## 2019-07-30 DIAGNOSIS — K5900 Constipation, unspecified: Secondary | ICD-10-CM | POA: Diagnosis not present

## 2019-07-30 MED ORDER — OMEPRAZOLE 20 MG PO CPDR: 20.0000 mg | DELAYED_RELEASE_CAPSULE | Freq: Every day | ORAL | 3 refills | Status: DC

## 2019-07-30 NOTE — Progress Notes (Signed)
[facility] Tele[facility] Visit  Patient consented to have virtual visit. Method of visit: Video  Encounter participants: Patient: Wanda Nixon - located at home Provider: Danna Hefty - located at [facility] Others (if applicable): None  Chief Complaint: Prior stomach bug  HPI: Patient notes she had a stomach bug x 1 week ago. Her nausea and vomiting have resolved but now she has residual stomach discomfort. She describes the pain as sharp. Sometimes she gets occasional nausea. This discomfort occurs intermittently - first thing in the morning, right after meals, and when she lays flat. She also endorses an occasional sour taste in her mouth. She has treated the discomfort with imodium, Pepto bismul and tums - the antacids helped a little.   She also suffers from chronic constipation with minimal improvement with OTC Miralax. She notes her last bowel movement on 3/27. She notes that she always feels like she is backed up. She tired the miralax for about 1 week but did not see much improvement.   ROS: per HPI  Pertinent PMHx: none  Exam:  Gen; well-appearing young female, sitting comfortably in her bedroom, her children playing in the background Respiratory: Breathing comfortably on room air, speaking in full sentences  Assessment/Plan:  Dyspepsia Patient is 1 week status post viral gastroenteritis now with residual epigastric discomfort that appears most consistent with dyspepsia/acid reflux.  She has had some improvement with over-the-counter Pepto-Bismol and Tums.  Given limitations of telemedicine, unable to rule out other upper GI etiologies like cholelithiasis/biliary colic.  We will do a trial of PPI for 4 to 6 weeks.  If no improvement patient was advised to return for an in person visit for further evaluation including abdominal exam and labs (CMP).  -Omeprazole 20 mg QD x4 to 6 weeks -Tums or other antacid for breakthrough pain -RTC in 4 to 6 weeks for  follow-up, sooner if no improvement or worsening pain -Return precautions discussed.  Patient understood and agreed to plan.  Constipation Patient also endorses chronic constipation with bowel movements every 3 to 4 days.  She has done a 1-week trial of MiraLAX with minimal improvement.  Recommended patient take 1-2 capfuls of MiraLAX twice daily until she has soft stools, then daily or as needed.  She was amendable to retrying MiraLAX as instructed.  Patient to return to care if no improvement in symptoms.  Total time: 25 minutes. This includes time spent with the patient during the visit as well as time spent before and after the visit reviewing the chart, documenting the encounter, making phone calls, reviewing studies, etc.  Wanda Nixon, [place], PGY2 07/30/19

## 2019-07-31 DIAGNOSIS — K5900 Constipation, unspecified: Secondary | ICD-10-CM | POA: Insufficient documentation

## 2019-07-31 DIAGNOSIS — R1013 Epigastric pain: Secondary | ICD-10-CM | POA: Insufficient documentation

## 2019-07-31 NOTE — Assessment & Plan Note (Signed)
Patient is 1 week status post viral gastroenteritis now with residual epigastric discomfort that appears most consistent with dyspepsia/acid reflux.  She has had some improvement with over-the-counter Pepto-Bismol and Tums.  Given limitations of telemedicine, unable to rule out other upper GI etiologies like cholelithiasis/biliary colic.  We will do a trial of PPI for 4 to 6 weeks.  If no improvement patient was advised to return for an in person visit for further evaluation including abdominal exam and labs (CMP).  -Omeprazole 20 mg QD x4 to 6 weeks -Tums or other antacid for breakthrough pain -RTC in 4 to 6 weeks for follow-up, sooner if no improvement or worsening pain -Return precautions discussed.  Patient understood and agreed to plan.

## 2019-07-31 NOTE — Assessment & Plan Note (Signed)
Patient also endorses chronic constipation with bowel movements every 3 to 4 days.  She has done a 1-week trial of MiraLAX with minimal improvement.  Recommended patient take 1-2 capfuls of MiraLAX twice daily until she has soft stools, then daily or as needed.  She was amendable to retrying MiraLAX as instructed.  Patient to return to care if no improvement in symptoms.

## 2019-08-10 NOTE — Progress Notes (Signed)
Error

## 2019-08-12 ENCOUNTER — Ambulatory Visit (INDEPENDENT_AMBULATORY_CARE_PROVIDER_SITE_OTHER): Payer: Medicaid Other | Admitting: Family Medicine

## 2019-08-12 ENCOUNTER — Other Ambulatory Visit: Payer: Self-pay

## 2019-08-12 VITALS — BP 110/70 | HR 85 | Wt 162.1 lb

## 2019-08-12 DIAGNOSIS — Z3491 Encounter for supervision of normal pregnancy, unspecified, first trimester: Secondary | ICD-10-CM

## 2019-08-12 DIAGNOSIS — Z3200 Encounter for pregnancy test, result unknown: Secondary | ICD-10-CM | POA: Diagnosis not present

## 2019-08-12 DIAGNOSIS — Z3490 Encounter for supervision of normal pregnancy, unspecified, unspecified trimester: Secondary | ICD-10-CM | POA: Insufficient documentation

## 2019-08-12 LAB — POCT URINE PREGNANCY: Preg Test, Ur: POSITIVE — AB

## 2019-08-12 MED ORDER — DOXYLAMINE-PYRIDOXINE 10-10 MG PO TBEC: 2.0000 | DELAYED_RELEASE_TABLET | Freq: Every day | ORAL | 0 refills | Status: DC

## 2019-08-12 NOTE — Patient Instructions (Addendum)
Sorry you are not able to see Dr. Sandi Carne today.  Congratulations on your pregnancy.  Today, we will get some basic pregnancy labs.  You can follow-up with Dr. Sandi Carne in 4 weeks.  If you have any abnormal labs, I will call you with those results.  Below, please find the dosing for Diclegis which she can take for nausea.  Diclegis: Two tablets at bedtime on day 1 and 2; if symptoms persist, take 1 tablet in morning and 2 tablets at bedtime on day 3; if symptoms persist, may increase to 1 tablet in morning, 1 tablet mid-afternoon, and 2 tablets at bedtime on day 4 (maximum: doxylamine 40 mg/pyridoxine 40 mg (4 tablets) per day).

## 2019-08-12 NOTE — Progress Notes (Signed)
    SUBJECTIVE:   CHIEF COMPLAINT / HPI:   Positive pregnancy test Wanda Nixon reports that she is excited about this unintended pregnancy.  This will be her fourth child.  She is currently in a comfortable relationship with her husband.  She is planning on keeping the pregnancy.  Her LMP was 06/03/2019.  She was tracking her periods and had been having regular periods.  She did note that she had some odd bleeding in the middle of the month in February which makes her unsure of the exact date of her last period.  She does not smoke or drink currently.  She takes folic acid and citalopram daily.  PERTINENT  PMH / PSH: General anxiety disorder  OBJECTIVE:   BP 110/70   Pulse 85   Wt 162 lb 2 oz (73.5 kg)   LMP 06/03/2019   SpO2 99%   BMI 32.75 kg/m    General: Alert and cooperative and appears to be in no acute distress HEENT: Neck non-tender without lymphadenopathy, masses or thyromegaly Cardio: Normal S1 and S2, no S3 or S4. Rhythm is regular. No murmurs or rubs.   Pulm: Clear to auscultation bilaterally, no crackles, wheezing, or diminished breath sounds. Normal respiratory effort Abdomen: Bowel sounds normal. Abdomen soft and non-tender.  Extremities: No peripheral edema. Warm/ well perfused.  Strong radial pulse. Neuro: Cranial nerves grossly intact   ASSESSMENT/PLAN:   Supervision of low-risk pregnancy Positive pregnancy test in clinic today.  Unintended pregnancy, she is planning to keep the pregnancy.  She is excited about the pregnancy.  She is taking a daily prenatal vitamin.  She is not smoking or drinking.  Medications reviewed.  Celexa is technically in the questionable category medications to take during pregnancy although there is little evidence that it carries teratogenic properties per up-to-date on ACOG.  She has been stable on this medication since the birth of her last child in 2017.  We will plan to continue Celexa for now.  She is not interested in genetic  screening at this time. -Follow-up OB urine culture -Follow-up OB panel -Dating ultrasound ordered due to unsure LMP -Follow-up for initial OB visit in 4 weeks -Diclegis prescribed for morning sickness     Wanda Haymaker, MD [place]

## 2019-08-12 NOTE — Assessment & Plan Note (Addendum)
Positive pregnancy test in clinic today.  Unintended pregnancy, she is planning to keep the pregnancy.  She is excited about the pregnancy.  She is taking a daily prenatal vitamin.  She is not smoking or drinking.  Medications reviewed.  Celexa is technically in the questionable category medications to take during pregnancy although there is little evidence that it carries teratogenic properties per up-to-date on ACOG.  She has been stable on this medication since the birth of her last child in 2017.  We will plan to continue Celexa for now.  She is not interested in genetic screening at this time. -Follow-up OB urine culture -Follow-up OB panel -Dating ultrasound ordered due to unsure LMP -Follow-up for initial OB visit in 4 weeks -Diclegis prescribed for morning sickness

## 2019-08-13 LAB — OBSTETRIC PANEL, INCLUDING HIV
Antibody Screen: NEGATIVE
Basophils Absolute: 0 10*3/uL (ref 0.0–0.2)
Basos: 0 %
EOS (ABSOLUTE): 0.2 10*3/uL (ref 0.0–0.4)
Eos: 3 %
HIV Screen 4th Generation wRfx: NONREACTIVE
Hematocrit: 35.8 % (ref 34.0–46.6)
Hemoglobin: 12.3 g/dL (ref 11.1–15.9)
Hepatitis B Surface Ag: NEGATIVE
Immature Grans (Abs): 0 10*3/uL (ref 0.0–0.1)
Immature Granulocytes: 0 %
Lymphocytes Absolute: 1.2 10*3/uL (ref 0.7–3.1)
Lymphs: 19 %
MCH: 27.5 pg (ref 26.6–33.0)
MCHC: 34.4 g/dL (ref 31.5–35.7)
MCV: 80 fL (ref 79–97)
Monocytes Absolute: 0.4 10*3/uL (ref 0.1–0.9)
Monocytes: 6 %
Neutrophils Absolute: 4.5 10*3/uL (ref 1.4–7.0)
Neutrophils: 72 %
Platelets: 366 10*3/uL (ref 150–450)
RBC: 4.47 x10E6/uL (ref 3.77–5.28)
RDW: 14.7 % (ref 11.7–15.4)
RPR Ser Ql: NONREACTIVE
Rh Factor: POSITIVE
Rubella Antibodies, IGG: 4.32 index (ref >0.99)
WBC: 6.2 10*3/uL (ref 3.4–10.8)

## 2019-08-14 ENCOUNTER — Telehealth: Payer: Self-pay

## 2019-08-14 ENCOUNTER — Other Ambulatory Visit: Payer: Self-pay | Admitting: Family Medicine

## 2019-08-14 LAB — CULTURE, OB URINE

## 2019-08-14 LAB — URINE CULTURE, OB REFLEX

## 2019-08-14 MED ORDER — DOXYLAMINE-PYRIDOXINE 10-10 MG PO TBEC: 1.0000 | DELAYED_RELEASE_TABLET | Freq: Every day | ORAL | 1 refills | Status: DC

## 2019-08-14 NOTE — Telephone Encounter (Signed)
Medicaid will not cover Doxylamine-Pyridoxine. Please see below for alternatives.

## 2019-08-14 NOTE — Telephone Encounter (Signed)
Diclegis sent in.

## 2019-08-16 ENCOUNTER — Telehealth: Payer: Self-pay | Admitting: *Deleted

## 2019-08-16 NOTE — Telephone Encounter (Signed)
Pt was unable to find other letters in chart.  Resent. Christen Bame, CMA

## 2019-08-19 ENCOUNTER — Ambulatory Visit (HOSPITAL_COMMUNITY)
Admission: RE | Admit: 2019-08-19 | Discharge: 2019-08-19 | Disposition: A | Discharge status: Home / Self Care | Payer: Medicaid Other | Source: Ambulatory Visit | Attending: Family Medicine | Admitting: Family Medicine

## 2019-08-19 ENCOUNTER — Encounter: Payer: Self-pay | Admitting: Family Medicine

## 2019-08-19 DIAGNOSIS — Z3200 Encounter for pregnancy test, result unknown: Secondary | ICD-10-CM | POA: Diagnosis not present

## 2019-08-19 DIAGNOSIS — Z3689 Encounter for other specified antenatal screening: Secondary | ICD-10-CM | POA: Diagnosis not present

## 2019-08-19 DIAGNOSIS — Z3A01 Less than 8 weeks gestation of pregnancy: Secondary | ICD-10-CM | POA: Diagnosis not present

## 2019-08-20 ENCOUNTER — Encounter: Payer: Self-pay | Admitting: Student in an Organized Health Care Education/Training Program

## 2019-08-20 ENCOUNTER — Ambulatory Visit (INDEPENDENT_AMBULATORY_CARE_PROVIDER_SITE_OTHER): Payer: Medicaid Other | Admitting: Student in an Organized Health Care Education/Training Program

## 2019-08-20 ENCOUNTER — Other Ambulatory Visit: Payer: Self-pay

## 2019-08-20 VITALS — BP 110/62 | HR 85 | Wt 160.0 lb

## 2019-08-20 DIAGNOSIS — B3731 Acute candidiasis of vulva and vagina: Secondary | ICD-10-CM

## 2019-08-20 DIAGNOSIS — B373 Candidiasis of vulva and vagina: Secondary | ICD-10-CM | POA: Diagnosis not present

## 2019-08-20 DIAGNOSIS — Z3490 Encounter for supervision of normal pregnancy, unspecified, unspecified trimester: Secondary | ICD-10-CM | POA: Diagnosis not present

## 2019-08-20 DIAGNOSIS — E860 Dehydration: Secondary | ICD-10-CM

## 2019-08-20 NOTE — Assessment & Plan Note (Signed)
As she is not currently uncomfortable we deferred swabbing her until Thursday when she will receive a pelvic exam at her initial prenatal visit.

## 2019-08-20 NOTE — Progress Notes (Addendum)
    SUBJECTIVE:   CHIEF COMPLAINT / HPI:   Nausea: Has had extreme nausea since this past Saturday. It is now interfering with her work. She has not been able to eat a full meal since last Friday. She is finding that sometimes it is hard to keep the medication down. When she is able to keep the doxylamine-pyridoxine down it helps. She has found that benadryl works and she was able to eat with that. She endorses a feeling of something "sitting in her throat" before she gets nauseous. She had similar nausea during her first pregnancy and benadryl helped during that time. She feels occasionally lightheaded from lack of eating and drinking. Additionally, her urine has been dark.  Possible Yeast infection: has noticed clumpy vaginal discharge for 3 days. Does not endorse itching, burning, or irritation. She states that gets yeast infections with every pregnancy.  She is additionally stressed about work - they have not been receptive to her frequent appointments and nausea.  PERTINENT  PMH / PSH: Dyspepsia, yeast vaginitis  OBJECTIVE:   BP 110/62   Pulse 85   Wt 160 lb (72.6 kg)   LMP 06/03/2019   BMI 32.32 kg/m   Physical Exam  Constitutional: She is well-developed, well-nourished, and in no distress. No distress.  Cardiovascular: Normal rate, regular rhythm and normal heart sounds.  Pulmonary/Chest: Effort normal and breath sounds normal.  Skin: She is not diaphoretic.   ASSESSMENT/PLAN:   Yeast vaginitis  As she is not currently uncomfortable we deferred swabbing her until Thursday when she will receive a pelvic exam at her initial prenatal visit.   Nausea:  This could be hyperemesis gravitum. Down 2 pounds from last visit. - Checking her OB panel, CBC, BMP, and urine to assess hydration status. Counseled on good eating habits and snaking during pregnancy. Will keep her on Doxylamine-pyridoxine as this helps when she is able to keep it down. Additionally talked about gingers lozenges.  She is down two pounds from her last week, but overall her weight is up. We will recheck her weight on Thursday at initial prenatal.  FMLA form: Discussed the FMLA form that she should get for work to cover her for her prenatal visits    Butts    I have seen and examined this patient.     I have discussed the findings and exam with the MS and agree with the above note I helped develop the management plan that is described in the MSs note, and I agree with the content.   Doristine Mango, DO PGY-2 Family Medicine Resident

## 2019-08-21 ENCOUNTER — Telehealth: Payer: Self-pay | Admitting: *Deleted

## 2019-08-21 NOTE — Telephone Encounter (Signed)
Per patient her HR is asking that she give them a 30 day advance notice for her appointments. The appointment that was scheduled for this Friday 08/23/19 at 8:30am had to be changed to Friday 09/20/19 at 8:50am.  Patient also was needing a letter with our letter head sent to my chart so she can send it to her HR. **Wanda Nixon has taken care of the letter**.! Thanks

## 2019-08-22 LAB — OBSTETRIC PANEL, INCLUDING HIV
Antibody Screen: NEGATIVE
Basophils Absolute: 0 x10E3/uL (ref 0.0–0.2)
Basos: 0 %
EOS (ABSOLUTE): 0.1 x10E3/uL (ref 0.0–0.4)
Eos: 2 %
HIV Screen 4th Generation wRfx: NONREACTIVE
Hematocrit: 37.3 % (ref 34.0–46.6)
Hemoglobin: 12.8 g/dL (ref 11.1–15.9)
Hepatitis B Surface Ag: NEGATIVE
Immature Grans (Abs): 0 x10E3/uL (ref 0.0–0.1)
Immature Granulocytes: 0 %
Lymphocytes Absolute: 1.2 x10E3/uL (ref 0.7–3.1)
Lymphs: 18 %
MCH: 27.8 pg (ref 26.6–33.0)
MCHC: 34.3 g/dL (ref 31.5–35.7)
MCV: 81 fL (ref 79–97)
Monocytes Absolute: 0.4 x10E3/uL (ref 0.1–0.9)
Monocytes: 7 %
Neutrophils Absolute: 4.7 x10E3/uL (ref 1.4–7.0)
Neutrophils: 73 %
Platelets: 354 x10E3/uL (ref 150–450)
RBC: 4.6 x10E6/uL (ref 3.77–5.28)
RDW: 14.8 % (ref 11.7–15.4)
RPR Ser Ql: NONREACTIVE
Rh Factor: POSITIVE
Rubella Antibodies, IGG: 4.22 {index}
WBC: 6.5 x10E3/uL (ref 3.4–10.8)

## 2019-08-22 LAB — COMPREHENSIVE METABOLIC PANEL
ALT: 13 IU/L (ref 0–32)
AST: 15 IU/L (ref 0–40)
Albumin/Globulin Ratio: 1.7 (ref 1.2–2.2)
Albumin: 4.7 g/dL (ref 3.9–5.0)
Alkaline Phosphatase: 61 IU/L (ref 39–117)
BUN/Creatinine Ratio: 11 (ref 9–23)
BUN: 8 mg/dL (ref 6–20)
Bilirubin Total: 0.4 mg/dL (ref 0.0–1.2)
CO2: 22 mmol/L (ref 20–29)
Calcium: 9.5 mg/dL (ref 8.7–10.2)
Chloride: 105 mmol/L (ref 96–106)
Creatinine, Ser: 0.73 mg/dL (ref 0.57–1.00)
GFR calc Af Amer: 131 mL/min/{1.73_m2} (ref >59)
GFR calc non Af Amer: 113 mL/min/{1.73_m2} (ref >59)
Globulin, Total: 2.8 g/dL (ref 1.5–4.5)
Glucose: 78 mg/dL (ref 65–99)
Potassium: 4.2 mmol/L (ref 3.5–5.2)
Sodium: 142 mmol/L (ref 134–144)
Total Protein: 7.5 g/dL (ref 6.0–8.5)

## 2019-08-22 LAB — HGB FRACTIONATION CASCADE
Hgb A2: 2.2 % (ref 1.8–3.2)
Hgb A: 97.8 % (ref 96.4–98.8)
Hgb F: 0 % (ref 0.0–2.0)
Hgb S: 0 %

## 2019-08-22 LAB — CULTURE, OB URINE

## 2019-08-22 LAB — URINE CULTURE, OB REFLEX

## 2019-08-23 ENCOUNTER — Encounter: Payer: Medicaid Other | Admitting: Family Medicine

## 2019-08-28 ENCOUNTER — Inpatient Hospital Stay (HOSPITAL_COMMUNITY)
Admission: AD | Admit: 2019-08-28 | Discharge: 2019-08-28 | Disposition: A | Discharge status: Home / Self Care | Payer: BLUE CROSS/BLUE SHIELD | Attending: Obstetrics & Gynecology | Admitting: Obstetrics & Gynecology

## 2019-08-28 ENCOUNTER — Inpatient Hospital Stay (HOSPITAL_COMMUNITY): Payer: BLUE CROSS/BLUE SHIELD

## 2019-08-28 ENCOUNTER — Encounter (HOSPITAL_COMMUNITY): Payer: Self-pay | Admitting: Obstetrics & Gynecology

## 2019-08-28 ENCOUNTER — Telehealth: Payer: Self-pay

## 2019-08-28 ENCOUNTER — Other Ambulatory Visit: Payer: Self-pay

## 2019-08-28 DIAGNOSIS — O219 Vomiting of pregnancy, unspecified: Secondary | ICD-10-CM

## 2019-08-28 DIAGNOSIS — O209 Hemorrhage in early pregnancy, unspecified: Secondary | ICD-10-CM | POA: Diagnosis not present

## 2019-08-28 DIAGNOSIS — Z3A01 Less than 8 weeks gestation of pregnancy: Secondary | ICD-10-CM | POA: Insufficient documentation

## 2019-08-28 DIAGNOSIS — B373 Candidiasis of vulva and vagina: Secondary | ICD-10-CM | POA: Diagnosis not present

## 2019-08-28 DIAGNOSIS — O021 Missed abortion: Secondary | ICD-10-CM | POA: Insufficient documentation

## 2019-08-28 DIAGNOSIS — O080 Genital tract and pelvic infection following ectopic and molar pregnancy: Secondary | ICD-10-CM | POA: Insufficient documentation

## 2019-08-28 DIAGNOSIS — B379 Candidiasis, unspecified: Secondary | ICD-10-CM

## 2019-08-28 DIAGNOSIS — O210 Mild hyperemesis gravidarum: Secondary | ICD-10-CM | POA: Diagnosis present

## 2019-08-28 LAB — CBC WITH DIFFERENTIAL/PLATELET
Abs Immature Granulocytes: 0.02 10*3/uL (ref 0.00–0.07)
Basophils Absolute: 0 10*3/uL (ref 0.0–0.1)
Basophils Relative: 0 %
Eosinophils Absolute: 0.2 10*3/uL (ref 0.0–0.5)
Eosinophils Relative: 2 %
HCT: 38.4 % (ref 36.0–46.0)
Hemoglobin: 12.8 g/dL (ref 12.0–15.0)
Immature Granulocytes: 0 %
Lymphocytes Relative: 15 %
Lymphs Abs: 1.1 10*3/uL (ref 0.7–4.0)
MCH: 27.3 pg (ref 26.0–34.0)
MCHC: 33.3 g/dL (ref 30.0–36.0)
MCV: 81.9 fL (ref 80.0–100.0)
Monocytes Absolute: 0.4 10*3/uL (ref 0.1–1.0)
Monocytes Relative: 5 %
Neutro Abs: 5.6 10*3/uL (ref 1.7–7.7)
Neutrophils Relative %: 78 %
Platelets: 383 10*3/uL (ref 150–400)
RBC: 4.69 MIL/uL (ref 3.87–5.11)
RDW: 14.1 % (ref 11.5–15.5)
WBC: 7.3 10*3/uL (ref 4.0–10.5)
nRBC: 0 % (ref 0.0–0.2)

## 2019-08-28 LAB — URINALYSIS, ROUTINE W REFLEX MICROSCOPIC
Bilirubin Urine: NEGATIVE
Glucose, UA: NEGATIVE mg/dL
Ketones, ur: NEGATIVE mg/dL
Nitrite: NEGATIVE
Protein, ur: NEGATIVE mg/dL
Specific Gravity, Urine: 1.026 (ref 1.005–1.030)
pH: 5 (ref 5.0–8.0)

## 2019-08-28 LAB — HCG, QUANTITATIVE, PREGNANCY: hCG, Beta Chain, Quant, S: 70050 m[IU]/mL — ABNORMAL HIGH (ref <5)

## 2019-08-28 LAB — HIV ANTIBODY (ROUTINE TESTING W REFLEX): HIV Screen 4th Generation wRfx: NONREACTIVE

## 2019-08-28 LAB — WET PREP, GENITAL
Clue Cells Wet Prep HPF POC: NONE SEEN
Sperm: NONE SEEN
Trich, Wet Prep: NONE SEEN

## 2019-08-28 MED ORDER — OXYCODONE HCL 5 MG PO TABS: 5.0000 mg | ORAL_TABLET | Freq: Four times a day (QID) | ORAL | 0 refills | Status: DC | PRN

## 2019-08-28 MED ORDER — MISOPROSTOL 200 MCG PO TABS
ORAL_TABLET | ORAL | 1 refills | Status: DC
Start: 2019-08-28 — End: 2019-09-16

## 2019-08-28 MED ORDER — IBUPROFEN 800 MG PO TABS
800.0000 mg | ORAL_TABLET | Freq: Three times a day (TID) | ORAL | 0 refills | Status: DC | PRN
Start: 2019-08-28 — End: 2020-07-08

## 2019-08-28 MED ORDER — PROMETHAZINE HCL 25 MG/ML IJ SOLN
25.0000 mg | Freq: Once | INTRAVENOUS | Status: AC
  Administered 2019-08-28: 25 mg via INTRAVENOUS
  Filled 2019-08-28: qty 1

## 2019-08-28 MED ORDER — PROMETHAZINE HCL 12.5 MG PO TABS
12.5000 mg | ORAL_TABLET | Freq: Four times a day (QID) | ORAL | 0 refills | Status: DC | PRN
Start: 2019-08-28 — End: 2019-09-20

## 2019-08-28 MED ORDER — FLUCONAZOLE 150 MG PO TABS
150.0000 mg | ORAL_TABLET | Freq: Every day | ORAL | 0 refills | Status: DC
Start: 2019-08-28 — End: 2019-09-20

## 2019-08-28 NOTE — Discharge Instructions (Signed)
Miscarriage A miscarriage is the loss of an unborn baby (fetus) before the 20th week of pregnancy. Follow these instructions at home: Medicines   Take over-the-counter and prescription medicines only as told by your doctor.  If you were prescribed antibiotic medicine, take it as told by your doctor. Do not stop taking the antibiotic even if you start to feel better.  Do not take NSAIDs unless your doctor says that this is safe for you. NSAIDs include aspirin and ibuprofen. These medicines can cause bleeding. Activity  Rest as directed. Ask your doctor what activities are safe for you.  Have someone help you at home during this time. General instructions  Write down how many pads you use each day and how soaked they are.  Watch the amount of tissue or clumps of blood (blood clots) that you pass from your vagina. Save any large amounts of tissue for your doctor.  Do not use tampons, douche, or have sex until your doctor approves.  To help you and your partner with the process of grieving, talk with your doctor or seek counseling.  When you are ready, meet with your doctor to talk about steps you should take for your health. Also, talk with your doctor about steps to take to have a healthy pregnancy in the future.  Keep all follow-up visits as told by your doctor. This is important. Contact a doctor if:  You have a fever or chills.  You have vaginal discharge that smells bad.  You have more bleeding. Get help right away if:  You have very bad cramps or pain in your back or belly.  You pass clumps of blood that are walnut-sized or larger from your vagina.  You pass tissue that is walnut-sized or larger from your vagina.  You soak more than 1 regular pad in an hour.  You get light-headed or weak.  You faint (pass out).  You have feelings of sadness that do not go away, or you have thoughts of hurting yourself. Summary  A miscarriage is the loss of an unborn baby before  the 20th week of pregnancy.  Follow your doctor's instructions for home care. Keep all follow-up appointments.  To help you and your partner with the process of grieving, talk with your doctor or seek counseling. This information is not intended to replace advice given to you by your health care provider. Make sure you discuss any questions you have with your health care provider. Document Revised: 08/10/2018 Document Reviewed: 05/24/2016 Elsevier Patient Education  2020 [employer]  

## 2019-08-28 NOTE — Telephone Encounter (Signed)
Patient calls nurse line regarding abdominal cramping during early pregnancy. Patient reports having light spotting, dark red in color and period-like cramping. Patient rates cramping pain at 8/10. Patient states that she will be eight weeks on Saturday, May 1st.   Patient received [place] on 4/19 that confirmed intrauterine pregnancy.   Spoke with Dr. Owens Shark regarding patient, and advised that patient be evaluated in MAU.   Patient informed and will go to MAU for evaluation.   Talbot Grumbling, RN

## 2019-08-28 NOTE — MAU Note (Signed)
Presents with c/o N&V, can't keep anything down.  Reports has vomited 10 or more times in past 24 hours.  Also c/o abdominal cramping & spotting.

## 2019-08-28 NOTE — Progress Notes (Signed)
Tomi Bamberger, PA @ bedside, attempting to view FHR via bedside U/S.  Unable to view FHR, pt will go for formal U/S.

## 2019-08-28 NOTE — MAU Provider Note (Signed)
History     CSN: [id number]  Arrival date and time: 08/28/19 1302   First Provider Initiated Contact with Patient 08/28/19 1357      Chief Complaint  Patient presents with  . Spotting  . Nausea  . Emesis  . Cramping   HPI Wanda Nixon is a 29 y.o. [phone] at [redacted]w[redacted]d who presents to MAU today with complaint of N/V, cramping and spotting. The patient states N/V has been going on for a few weeks but much worse the last 3 days. She hasn't been able to keep anything down. She was given Diclegis at [facility] and is unable to keep it down. She states cramping and spotting noted this afternoon. Bleeding is light. She has not taken anything for pain. She had [place] on 08/19/19 that showed SIUP with normal FHR of 134 bpm and no evidence of [place].   OB History    Gravida  6   Para  3   Term  3   Preterm  0   AB      Living  3     SAB      TAB      Ectopic      Multiple  0   Live Births  3           Past Medical History:  Diagnosis Date  . Anemia   . Asthma    Last used 08/30/14; weekly inhaler use, used inhaler 2 weeks ago  . Cholestasis of pregnancy in third trimester 07/25/2015  . Depression    celexa off for several months  . Eczema   . Headache(784.0)    migraines  . NST (non-stress test) nonreactive   . SVD (spontaneous vaginal delivery) 07/26/2015    Past Surgical History:  Procedure Laterality Date  . MOUTH SURGERY    . TYMPANOSTOMY TUBE PLACEMENT    . WISDOM TOOTH EXTRACTION      Family History  Problem Relation Age of Onset  . Diabetes Maternal Grandmother   . Hypertension Maternal Grandmother   . Stroke Maternal Grandmother   . Hypertension Mother   . Hyperlipidemia Mother   . Hyperlipidemia Father     Social History   Tobacco Use  . Smoking status: Never Smoker  . Smokeless tobacco: Never Used  Substance Use Topics  . Alcohol use: Not Currently    Comment: Occasional  . Drug use: No    Allergies:  Allergies  Allergen Reactions  .  Pulmicort [Budesonide] Palpitations  . Tomato Hives and Itching  . Mushroom Extract Complex Hives and Itching  . Reglan [Metoclopramide] Anxiety    Pt reports makes her anxiety worse.     Medications Prior to Admission  Medication Sig Dispense Refill Last Dose  . albuterol (VENTOLIN HFA) 108 (90 Base) MCG/ACT inhaler Inhale 2 puffs into the lungs every 6 (six) hours as needed for wheezing or shortness of breath. 1 Inhaler 3 Past Month at Unknown time  . citalopram (CELEXA) 20 MG tablet Take 1.5 tablets (30 mg total) by mouth daily. 135 tablet 2 08/27/2019 at 1900  . Doxylamine-Pyridoxine (DICLEGIS) 10-10 MG TBEC Take 1 tablet by mouth daily. Please see AVS from clinic for dosing instructions. 60 tablet 1 [id number]  . folic acid (FOLVITE) 1 MG tablet Take 1 tablet (1 mg total) by mouth daily. 30 tablet 0   . hydrOXYzine (ATARAX/VISTARIL) 10 MG tablet Take 1 tablet (10 mg total) by mouth 3 (three) times daily as needed. (Patient not taking:  Reported on 08/20/2019) 30 tablet 0   . loratadine (CLARITIN) 10 MG tablet Take 1 tablet (10 mg total) by mouth daily. 30 tablet 11   . Prenatal Vit-Fe Fumarate-FA (PRENATAL VITAMIN) 27-0.8 MG TABS Take 1 tablet by mouth daily. 90 tablet 1 08/24/2019  . triamcinolone cream (KENALOG) 0.1 % Apply 1 application topically 2 (two) times daily as needed (rash/itching). 80 g 3     Review of Systems  Constitutional: Negative for fever.  Gastrointestinal: Positive for abdominal pain, constipation, nausea and vomiting. Negative for diarrhea.  Genitourinary: Positive for vaginal bleeding. Negative for dysuria, frequency, urgency and vaginal discharge.   Physical Exam   Blood pressure 113/79, pulse 92, temperature 98.4 F (36.9 C), temperature source Oral, resp. rate 20, height 4\' 11"  (1.499 m), weight 72.1 kg, last menstrual period 06/03/2019, SpO2 100 %.  Physical Exam  Nursing note and vitals reviewed. Constitutional: She is oriented to person, place, and time.  She appears well-developed and well-nourished. No distress.  HENT:  Head: Normocephalic and atraumatic.  Cardiovascular: Normal rate.  Respiratory: Effort normal.  GI: Soft. She exhibits no distension and no mass. There is abdominal tenderness (mild diffuse). There is no rebound and no guarding.  Genitourinary: Uterus is enlarged. Uterus is not tender. Cervix exhibits no motion tenderness, no discharge and no friability. Right adnexum displays no mass and no tenderness. Left adnexum displays no mass and no tenderness.    Vaginal discharge (small, thick, white discharge noted) present.     No vaginal bleeding.  No bleeding in the vagina.  Neurological: She is alert and oriented to person, place, and time.  Skin: Skin is warm and dry. No erythema.  Psychiatric: She has a normal mood and affect.  Cervix: closed, thick, firm   Results for orders placed or performed during the hospital encounter of 08/28/19 (from the past 24 hour(s))  CBC with Differential/Platelet     Status: None   Collection Time: 08/28/19  1:38 PM  Result Value Ref Range   WBC 7.3 4.0 - 10.5 K/uL   RBC 4.69 3.87 - 5.11 MIL/uL   Hemoglobin 12.8 12.0 - 15.0 g/dL   HCT 38.4 36.0 - 46.0 %   MCV 81.9 80.0 - 100.0 fL   MCH 27.3 26.0 - 34.0 pg   MCHC 33.3 30.0 - 36.0 g/dL   RDW 14.1 11.5 - 15.5 %   Platelets 383 150 - 400 K/uL   nRBC 0.0 0.0 - 0.2 %   Neutrophils Relative % 78 %   Neutro Abs 5.6 1.7 - 7.7 K/uL   Lymphocytes Relative 15 %   Lymphs Abs 1.1 0.7 - 4.0 K/uL   Monocytes Relative 5 %   Monocytes Absolute 0.4 0.1 - 1.0 K/uL   Eosinophils Relative 2 %   Eosinophils Absolute 0.2 0.0 - 0.5 K/uL   Basophils Relative 0 %   Basophils Absolute 0.0 0.0 - 0.1 K/uL   Immature Granulocytes 0 %   Abs Immature Granulocytes 0.02 0.00 - 0.07 K/uL  hCG, quantitative, pregnancy     Status: Abnormal   Collection Time: 08/28/19  1:38 PM  Result Value Ref Range   hCG, Beta Chain, Quant, S 70,050 (H) <5 mIU/mL  HIV Antibody  (routine testing w rflx)     Status: None   Collection Time: 08/28/19  1:38 PM  Result Value Ref Range   HIV Screen 4th Generation wRfx Non Reactive Non Reactive  Wet prep, genital     Status: Abnormal  Collection Time: 08/28/19  2:12 PM   Specimen: Cervix  Result Value Ref Range   Yeast Wet Prep HPF POC PRESENT (A) NONE SEEN   Trich, Wet Prep NONE SEEN NONE SEEN   Clue Cells Wet Prep HPF POC NONE SEEN NONE SEEN   WBC, Wet Prep HPF POC MANY (A) NONE SEEN   Sperm NONE SEEN    US OB Transvaginal  Result Date: 08/28/2019 CLINICAL DATA:  Vaginal bleeding in first trimester of pregnancy, spotting, cramping, with a quantitative beta HCG = 70,050 ; LMP 06/03/2019 EXAM: TRANSVAGINAL OB ULTRASOUND TECHNIQUE: Transvaginal ultrasound was performed for complete evaluation of the gestation as well as the maternal uterus, adnexal regions, and pelvic cul-de-sac. COMPARISON:  08/19/2019 FINDINGS: Intrauterine gestational sac: Present, single Yolk sac:  Present Embryo:  Present Cardiac Activity: Absent, though was seen previously Heart Rate: N/A bpm CRL:   12.5 mm   7 w 3 d                  [place] EDC: 04/12/2020 Subchorionic hemorrhage:  None visualized. Maternal uterus/adnexae: Anteverted uterus, otherwise unremarkable. No free pelvic fluid or adnexal masses. LEFT ovary normal size and morphology, 2.8 x 3.1 x 1.7 cm. RIGHT ovary measures 2.9 x 4.5 x 2.5 cm and contains a small corpus luteal cyst. IMPRESSION: Single intrauterine gestation identified. No fetal cardiac activity is identified. Findings meet definitive criteria for failed pregnancy. This follows SRU consensus guidelines: Diagnostic Criteria for Nonviable Pregnancy Early in the First Trimester. Alison Stalling J Med [phone]. Electronically Signed   By: Lavonia Dana M.D.   On: 08/28/2019 15:49   US OB LESS THAN 14 WEEKS WITH OB TRANSVAGINAL  Result Date: 08/19/2019 CLINICAL DATA:  Evaluate early pregnancy. EXAM: OBSTETRIC <14 WK [place] AND TRANSVAGINAL OB US  TECHNIQUE: Both transabdominal and transvaginal ultrasound examinations were performed for complete evaluation of the gestation as well as the maternal uterus, adnexal regions, and pelvic cul-de-sac. Transvaginal technique was performed to assess early pregnancy. COMPARISON:  None. FINDINGS: Intrauterine gestational sac: Single Yolk sac:  Visualized. Embryo:  Visualized. Cardiac Activity: Visualized. Heart Rate: 134 bpm CRL:  6.0 mm   6 w   2 d                  [place] EDC: 04/11/2020 Subchorionic hemorrhage:  None visualized. Maternal uterus/adnexae: Right ovary: Normal Left ovary: Normal Other :None Free fluid:  Trace IMPRESSION: 1. Single living intrauterine gestation with an estimated gestational age of [redacted] weeks and 2 days. 2. Please note: The gestational age by crown rump length is discordant with the gestational age by last menstrual. Electronically Signed   By: Kerby Moors M.D.   On: 08/19/2019 11:43    MAU Course  Procedures Pt informed that the ultrasound is considered a limited OB ultrasound and is not intended to be a complete ultrasound exam.  Patient also informed that the ultrasound is not being completed with the intent of assessing for fetal or placental anomalies or any pelvic abnormalities.  Explained that the purpose of today's ultrasound is to assess for  viability.  Patient acknowledges the purpose of the exam and the limitations of the study.    MDM Bedside US attempted to confirm viability, unable to to visualize with abdominal US at bedside US ordered IV LR with 25 mg Phenergan infusion given. Patient feels nausea has improved and no emesis while in MAU US showed no FHR noted today that was seen previously. This is consistent with  Missed AB.  Options for management discussed with patient including expectant management vs Cytotec vs D&E. Patient opts for Cytotec at this time.   Early Intrauterine Pregnancy Failure  _x__  Documented intrauterine pregnancy failure less than or equal  to [redacted] weeks gestation  _x__  No serious current illness  _x__  Baseline Hgb greater than or equal to 10g/dl  _x__  Patient has easily accessible transportation to the hospital  _x__  Clear preference  _x__  Practitioner/physician deems patient reliable  _x__  Counseling by practitioner or physician  _x__  Patient education by RN  _N/A__  Consent form signed  _N/A__  Rho-Gam given by RN if indicated  _x__ Medication dispensed   _x__   Cytotec 800 mcg  _x_   Intravaginally by patient at home         __   Intravaginally by RN in MAU        __   Rectally by patient at home        __   Rectally by RN in MAU  _x__  Ibuprofen 600 mg 1 tablet by mouth every 6 hours as needed #30  __x_  Roxicodone 5 mg by mouth every 6 hours as needed  _x__  Phenergan 12.5 mg by mouth every 4 hours as needed for nausea      Assessment and Plan  A: Missed AB  Nausea and vomiting in pregnancy before [redacted] weeks gestation  Yeast infection   P:  Discharge home Rx as noted above and Diflucan sent to patient's pharmacy  Bleeding precautions discussed Patient advised to follow-up with [facility] in 1 week for hCG and 2 weeks with a provider Patient may return to MAU as needed or if her condition were to change or worsen  Kerry Hough, PA-C 08/28/2019, 4:52 PM

## 2019-08-29 LAB — RPR: RPR Ser Ql: NONREACTIVE

## 2019-08-29 LAB — GC/CHLAMYDIA PROBE AMP (~~LOC~~) NOT AT ARMC
Chlamydia: NEGATIVE
Comment: NEGATIVE
Comment: NORMAL
Neisseria Gonorrhea: NEGATIVE

## 2019-09-10 ENCOUNTER — Telehealth: Payer: Self-pay | Admitting: Family Medicine

## 2019-09-10 NOTE — Telephone Encounter (Signed)
Attempted to contact patient to get her scheduled for lab work and SAB f/u. No answer, left voicemail for patient to give the office a call back to be scheduled.

## 2019-09-11 ENCOUNTER — Other Ambulatory Visit: Payer: Self-pay | Admitting: Family Medicine

## 2019-09-11 ENCOUNTER — Ambulatory Visit: Payer: Medicaid Other | Admitting: Family Medicine

## 2019-09-11 MED ORDER — CETIRIZINE HCL 10 MG PO TABS
10.0000 mg | ORAL_TABLET | Freq: Every day | ORAL | 11 refills | Status: DC
Start: 2019-09-11 — End: 2021-05-07

## 2019-09-11 MED ORDER — FLUTICASONE PROPIONATE 50 MCG/ACT NA SUSP
1.0000 | Freq: Every day | NASAL | 12 refills | Status: DC
Start: 2019-09-11 — End: 2021-05-07

## 2019-09-11 NOTE — Progress Notes (Signed)
Patient reports that she went home and inserted the pills and the baby was out by 6 AM.  She states that afterward she did have some clotting and big chunks of blood for couple of days.  She does report that her bleeding has since resolved.  She does report that she has been trying to keep busy with work but often has times where she feels down.  She is not having any thoughts of hurting herself or others.  She is trying to distract herself with work.  Patient does state that she has had some issues with feeling lightheaded or dizzy.  She missed this appointment due to work and would like to reschedule for 09/16/2019.  She also is requesting a refill of her allergy medications.  Martinique Adalida Garver, DO PGY-3, [facility]

## 2019-09-16 ENCOUNTER — Ambulatory Visit (INDEPENDENT_AMBULATORY_CARE_PROVIDER_SITE_OTHER): Payer: BLUE CROSS/BLUE SHIELD | Admitting: Family Medicine

## 2019-09-16 ENCOUNTER — Other Ambulatory Visit: Payer: Self-pay

## 2019-09-16 ENCOUNTER — Encounter: Payer: Self-pay | Admitting: Family Medicine

## 2019-09-16 VITALS — BP 130/62 | HR 76 | Wt 159.0 lb

## 2019-09-16 DIAGNOSIS — J309 Allergic rhinitis, unspecified: Secondary | ICD-10-CM | POA: Diagnosis not present

## 2019-09-16 DIAGNOSIS — T7840XD Allergy, unspecified, subsequent encounter: Secondary | ICD-10-CM

## 2019-09-16 DIAGNOSIS — O039 Complete or unspecified spontaneous abortion without complication: Secondary | ICD-10-CM

## 2019-09-16 DIAGNOSIS — Z308 Encounter for other contraceptive management: Secondary | ICD-10-CM

## 2019-09-16 DIAGNOSIS — Z30015 Encounter for initial prescription of vaginal ring hormonal contraceptive: Secondary | ICD-10-CM

## 2019-09-16 DIAGNOSIS — J45909 Unspecified asthma, uncomplicated: Secondary | ICD-10-CM

## 2019-09-16 DIAGNOSIS — F418 Other specified anxiety disorders: Secondary | ICD-10-CM

## 2019-09-16 DIAGNOSIS — L309 Dermatitis, unspecified: Secondary | ICD-10-CM | POA: Diagnosis not present

## 2019-09-16 LAB — POCT HEMOGLOBIN: Hemoglobin: 12.8 g/dL (ref 11–14.6)

## 2019-09-16 MED ORDER — PROPRANOLOL HCL 10 MG PO TABS: ORAL_TABLET | ORAL | 0 refills | Status: DC

## 2019-09-16 MED ORDER — FLOVENT HFA 110 MCG/ACT IN AERO
2.0000 | INHALATION_SPRAY | Freq: Two times a day (BID) | RESPIRATORY_TRACT | 3 refills | Status: DC
Start: 2019-09-16 — End: 2021-05-07

## 2019-09-16 MED ORDER — CLONAZEPAM 0.5 MG PO TABS
0.5000 mg | ORAL_TABLET | Freq: Two times a day (BID) | ORAL | 0 refills | Status: DC | PRN
Start: 2019-09-16 — End: 2019-10-29

## 2019-09-16 MED ORDER — ALBUTEROL SULFATE HFA 108 (90 BASE) MCG/ACT IN AERS: 2.0000 | INHALATION_SPRAY | Freq: Four times a day (QID) | RESPIRATORY_TRACT | 2 refills | Status: DC | PRN

## 2019-09-16 MED ORDER — ETONOGESTREL-ETHINYL ESTRADIOL 0.12-0.015 MG/24HR VA RING
VAGINAL_RING | VAGINAL | 12 refills | Status: DC
Start: 2019-09-16 — End: 2020-07-17

## 2019-09-16 MED ORDER — TRIAMCINOLONE ACETONIDE 0.1 % EX OINT
1.0000 | TOPICAL_OINTMENT | Freq: Two times a day (BID) | CUTANEOUS | 0 refills | Status: DC
Start: 2019-09-16 — End: 2019-09-19

## 2019-09-16 MED ORDER — NORGESTIMATE-ETH ESTRADIOL 0.25-35 MG-MCG PO TABS: 1.0000 | ORAL_TABLET | Freq: Every day | ORAL | 4 refills | Status: DC

## 2019-09-16 NOTE — Progress Notes (Signed)
SUBJECTIVE:   CHIEF COMPLAINT / HPI:   Follow-up after recent spontaneous abortion. Patient reports that she has a good support system at home and has been doing better. She states she is trying to keep busy with work so that she is distracted. She does state that she has been having long conversations with her husband and given that she has been pregnant six times and already has three children she is considering having a tubal ligation. She has previously had the IUD and had to have it removed due to abdominal pain. She states she previously had  Depo which caused weight gain. She is also open to starting either the NuvaRing or pill to avoid pregnancy given that she knows right now that would be a long-term decision and she had a recent trauma. Patient reports that she has stopped bleeding and is no longer having abdominal discomfort. She does report that she often thinks of feeling the abortion happen and causes her to become upset. She denies any SI/HI.  Asthma: Patient reports that she has recently been using her albuterol inhaler more as well as her nebulizers due to pollen. She states that since we spoke over the phone she has been given taking Zyrtec and using Flonase however she is still requiring her albuterol more. She states that previously she has been on on a controller medication. She has not used one in quite some time however. She states she is using her albuterol 3-4 times per day. She denies any shortness of breath.  Eczema: Patient reports that her eczema has been flaring up during this season. She is requesting refill of triamcinolone cream. 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 She states that she often has issues with being on a plane including panic attacks. Patient states she has not tried anything for this in the past. She states she also does  have some concern that she will be too anxious to speak appropriately especially given her recent trauma. She reports that when she gets on a plane she feels of this the walls are closing in and there is a sense of impending doom and her heart races and she cannot seem to control her breathing well.  PERTINENT  PMH / PSH: Asthma, eczema, generalized anxiety disorder  OBJECTIVE:  BP 130/62   Pulse 76   Wt 159 lb (72.1 kg)   LMP 06/03/2019   SpO2 99%   BMI 32.11 kg/m   General: NAD, pleasant Neck: Supple Cardiovascular: RRR, no m/r/g, no LE edema Respiratory: CTA BL, normal work of breathing Psych: AOx3, appropriate affect  ASSESSMENT/PLAN:   Situational anxiety Patient provided with 4 tablets of Klonopin in order to help her with her upcoming airplane trips. Patient also provided with note to be able to sit next to her husband.   For patient situational anxiety surrounding her public speaking. Patient to trial propranolol 10 mg to take 30 minutes prior to speaking events. Patient with no known history of cardiac issues.  Asthma Refilled patient's albuterol. Given her increase in symptoms we will also start her on Flovent twice daily that may only need to be continued during seasonal exacerbations. Patient currently with clear lungs on exam.   Allergies Patient provided with Flonase and Zyrtec prescriptions.  Eczema Patient counseled on need to use creams regularly. Also refilled triamcinolone cream.  Contraception management Patient doing well after recent spontaneous abortion and has support system. She will reach out if she needs anything further. Did discuss options for birth control during this time. Patient would like to trial NuvaRing and if she has any issues with this she would like to have an OCP as a backup. Patient to further discuss tubal ligation with husband that we will not make the decision during this acute phase after spontaneous abortion.   Martinique Shirley, DO  PGY-3, [facility]

## 2019-09-16 NOTE — Patient Instructions (Addendum)
Thank you for coming to see me today. It was a pleasure! Today we talked about:   For your anxiety, I have provided you with Klonopin that you can take prior to getting on each plane ride.  I have also given you propranolol that you can take 30 minutes before any events for you speak or have anxiety.  For your asthma, we will start you on a daily medication in order to reduce amount of times you are using albuterol.  Please take the Flovent twice daily and be sure to rinse your mouth out with water after using this.  I have also refilled your albuterol to use as needed.  For contraception, I have sent in a NuvaRing for you to try.  If you do not like the way the NuvaRing works I have sent in an OCP called [facility]inte[facility] to your pharmacy.  I have also refilled your eczema cream.  Please follow-up as needed.  If you have any questions or concerns, please do not hesitate to call the office at [phone].  Take Care,   Martinique Arel Tippen, DO

## 2019-09-17 ENCOUNTER — Encounter: Payer: Self-pay | Admitting: Family Medicine

## 2019-09-19 ENCOUNTER — Telehealth: Payer: Self-pay | Admitting: Family Medicine

## 2019-09-19 ENCOUNTER — Encounter: Payer: Medicaid Other | Admitting: Family Medicine

## 2019-09-19 MED ORDER — TRIAMCINOLONE ACETONIDE 0.1 % EX CREA
1.0000 | TOPICAL_CREAM | Freq: Two times a day (BID) | CUTANEOUS | 0 refills | Status: DC
Start: 2019-09-19 — End: 2020-06-22

## 2019-09-19 NOTE — Telephone Encounter (Signed)
Patient is wanting Dr. Enid Derry to write a letter and send it to my chart before tomorrow at 2 before she gets on the plane. She wants the letter to state that she needs to sit beside her husband because she needs someone for support because of her anxiety. She said they have her sitting alone and she doesn't do flying. Thanks

## 2019-09-19 NOTE — Telephone Encounter (Signed)
Will forward to MD. Lindsay Soulliere,CMA  

## 2019-09-20 ENCOUNTER — Encounter: Payer: Self-pay | Admitting: Family Medicine

## 2019-09-20 ENCOUNTER — Encounter: Payer: Medicaid Other | Admitting: Family Medicine

## 2019-09-20 DIAGNOSIS — T7840XA Allergy, unspecified, initial encounter: Secondary | ICD-10-CM | POA: Insufficient documentation

## 2019-09-20 DIAGNOSIS — Z309 Encounter for contraceptive management, unspecified: Secondary | ICD-10-CM | POA: Insufficient documentation

## 2019-09-20 NOTE — Assessment & Plan Note (Signed)
Patient doing well after recent spontaneous abortion and has support system. She will reach out if she needs anything further. Did discuss options for birth control during this time. Patient would like to trial NuvaRing and if she has any issues with this she would like to have an OCP as a backup. Patient to further discuss tubal ligation with husband that we will not make the decision during this acute phase after spontaneous abortion.

## 2019-09-20 NOTE — Assessment & Plan Note (Signed)
Patient provided with 4 tablets of Klonopin in order to help her with her upcoming airplane trips. Patient also provided with note to be able to sit next to her husband.   For patient situational anxiety surrounding her public speaking. Patient to trial propranolol 10 mg to take 30 minutes prior to speaking events. Patient with no known history of cardiac issues.

## 2019-09-20 NOTE — Telephone Encounter (Signed)
Letter sent via My Chart.

## 2019-09-20 NOTE — Assessment & Plan Note (Signed)
Patient provided with Flonase and Zyrtec prescriptions.

## 2019-09-20 NOTE — Assessment & Plan Note (Signed)
Patient counseled on need to use creams regularly. Also refilled triamcinolone cream.

## 2019-09-20 NOTE — Assessment & Plan Note (Signed)
Refilled patient's albuterol. Given her increase in symptoms we will also start her on Flovent twice daily that may only need to be continued during seasonal exacerbations. Patient currently with clear lungs on exam.

## 2019-10-02 ENCOUNTER — Encounter: Payer: Self-pay | Admitting: Family Medicine

## 2019-10-08 ENCOUNTER — Other Ambulatory Visit: Payer: Self-pay | Admitting: Family Medicine

## 2019-10-08 NOTE — Telephone Encounter (Signed)
Needs appointment

## 2019-10-24 ENCOUNTER — Other Ambulatory Visit: Payer: Self-pay

## 2019-10-24 MED ORDER — PROPRANOLOL HCL 10 MG PO TABS: ORAL_TABLET | ORAL | 0 refills | Status: DC

## 2019-10-29 ENCOUNTER — Encounter: Payer: Self-pay | Admitting: Family Medicine

## 2019-10-29 ENCOUNTER — Other Ambulatory Visit: Payer: Self-pay

## 2019-10-29 ENCOUNTER — Ambulatory Visit (INDEPENDENT_AMBULATORY_CARE_PROVIDER_SITE_OTHER): Payer: Medicaid Other | Admitting: Family Medicine

## 2019-10-29 DIAGNOSIS — F411 Generalized anxiety disorder: Secondary | ICD-10-CM | POA: Diagnosis not present

## 2019-10-29 MED ORDER — CLONAZEPAM 0.5 MG PO TABS: 0.5000 mg | ORAL_TABLET | Freq: Two times a day (BID) | ORAL | 0 refills | Status: DC | PRN

## 2019-10-29 MED ORDER — CITALOPRAM HYDROBROMIDE 40 MG PO TABS: 40.0000 mg | ORAL_TABLET | Freq: Every day | ORAL | 2 refills | Status: DC

## 2019-10-29 NOTE — Assessment & Plan Note (Signed)
After extensive discussion with patient she feels safe at home but would like medication in order to help her appetite.  Provided with resources in order for her to establish care with a therapist in the interim.  Patient provided with Klonopin twenty tabs at 0.5 mg as a bridge to increasing her Celexa to 40 mg.  Patient understanding that this is not safe long-term medication and she would only like it for short period of time while dealing with the loss of her child.  She voiced understanding of plan and will reach out to therapy and will follow up with PCP in 2 to 4 weeks.

## 2019-10-29 NOTE — Progress Notes (Signed)
   SUBJECTIVE:   CHIEF COMPLAINT / HPI:   Recent miscarriage  anxiety: Patient reports that since her recent miscarriage she has noticed that at work and in life she has had moments where she has pretty severe anxiety.  She is coming into the doctor today because she recently had a coworker who voiced her concerns with anxiety and it caused the patient to have an almost anxiety attack.  She states that her previous dosing of Celexa helped her until this acute stressor occurred.  She states that she is now constantly worried that she might lose one of her children and also has issues going to the bathroom in her house where she experienced her miscarriage.  She states that she has a friend who is in therapy and is trying to hook her up with a specialist however she is having trouble finding someone who will cover her Medicaid.  She is interested in establishing therapy but would also like medications in order to help her in her current state to manage her anxiety.  Patient has never had any trouble with addiction in the past.  She has no current plans of hurting herself or others.  She states that she feels safe at home safe to herself and safe to her children.  She denies any SI/HI.  PERTINENT  PMH / PSH: Anxiety  OBJECTIVE:  BP 120/80   Pulse (!) 104   Ht 4\' 11"  (1.499 m)   Wt 157 lb (71.2 kg)   SpO2 98%   BMI 31.71 kg/m   General: NAD, pleasant Neck: Supple Respiratory: normal work of breathing Psych: AOx3, appropriate affect  ASSESSMENT/PLAN:   Generalized anxiety disorder After extensive discussion with patient she feels safe at home but would like medication in order to help her appetite.  Provided with resources in order for her to establish care with a therapist in the interim.  Patient provided with Klonopin twenty tabs at 0.5 mg as a bridge to increasing her Celexa to 40 mg.  Patient understanding that this is not safe long-term medication and she would only like it for short  period of time while dealing with the loss of her child.  She voiced understanding of plan and will reach out to therapy and will follow up with PCP in 2 to 4 weeks.    Wanda Colisha Redler, DO PGY-3, [facility]

## 2019-10-29 NOTE — Patient Instructions (Addendum)
Thank you for coming to see me today. It was a pleasure! Today we talked about:   I have sent in some clonazepam for you to use as needed while we increase your celexa. Please start taking 40 mg once daily. Please be sure to follow up with Dr. Sandi Carne in 2-4 weeks to see how you are doing ont he medication or sooner as you are able.   Please follow-up with our clinic in or sooner as needed.  If you have any questions or concerns, please do not hesitate to call the office at [phone].  Take Care,   [place] Claudy Abdallah, DO   Therapy and Counseling Resources Most providers on this list will take Medicaid. Patients with commercial insurance or Medicare should contact their insurance company to get a list of in network providers.  [employer] [facility]  [address], [place], [place] [address]      [place] [address]  [place], [place] [address] [phone]  [place] [address], [place]  [place], [place] [address]       [phone]     Jinny Bl[facility] [address], [place], [place]  [facility]:  [place]. [place]  [facility]:  [place] [address] Loni Muse, [place]  [facility] (under & uninsured) [address] [place] [phone]    [email or web address]     Phone:  [phone]     [place] [facility]  [place] [address]      [place], [place] ext [place]: [place], [place], [place]   [place] ([place] therapist) [address] [place] [address]   [place] [address]    [phone]    [facility]   [place],  [place], [place]    [place]  [place]  [facility]  [address] [place], [place]        [phone]  Open Access/Walk In Clinic under & uninsured [place], To schedule an appointment call [phone] [address] [place] [phone]):  Fay Records from 8 AM - 3 PM Moving June 1 to [employer] at [facility] [address] [place],  ([place])   [place], [place]: [phone]) 8:30 - 12; 1 - 2:30  [facility] of the [place],  [place], [place]    ([phone]):8:30 - 12; 2 - 3PM  [facility] [place],  [address]  [place]; [phone]):   Mon - Fri 8 AM - 5 PM  [facility] [place]  MWF 12:30 to 3:00 or call to schedule an appointment  [phone]  Specific Provider options Psychology Today  [email or web address] 1. click on find a therapist  2. enter your zip code 3. left side and select or tailor a therapist for your specific need.   [facility] Provider Directory [email or web address]  (Medicaid)   Follow all drop down to find a provider  [place] [phone] or [email or web address]  [address], [place], [place] [facility] support and educational   In home counseling [place] Telephone: [phone]  office in [place] [email or web address]   Does not take reg. Medicaid or Medicare private insurance [facility], [place] health Choice, [facility], [place], [place], [place], [place] Health Choice  24- Hour Availability:  . [place] or [phone]  . Family Service of the [employer] [phone]  [facility] Crisis Service  [phone]   . [place]  [phone] (after hours)  . Therapeutic  Alternative/Mobile Crisis   4195910933  . [place] National Suicide Hotline  684-337-7639 ([place])  . Call 911 or go to emergency room  . [employer]  [phone]);  [place] and [employer]   . [facility] ACCESS  [phone]); [place], [place], [place], [place], [place], [place], [place]

## 2019-11-05 ENCOUNTER — Encounter: Payer: Self-pay | Admitting: Family Medicine

## 2019-11-06 NOTE — Telephone Encounter (Signed)
Patient returns call to nurse line to check on status of receiving medication for yeast infection.   To PCP  Talbot Grumbling, RN

## 2019-11-24 ENCOUNTER — Encounter: Payer: Self-pay | Admitting: Family Medicine

## 2019-12-10 ENCOUNTER — Ambulatory Visit: Payer: Medicaid Other

## 2019-12-26 ENCOUNTER — Encounter: Payer: Self-pay | Admitting: Student in an Organized Health Care Education/Training Program

## 2019-12-26 ENCOUNTER — Other Ambulatory Visit: Payer: Self-pay

## 2019-12-26 ENCOUNTER — Ambulatory Visit (INDEPENDENT_AMBULATORY_CARE_PROVIDER_SITE_OTHER): Payer: Medicaid Other | Admitting: Student in an Organized Health Care Education/Training Program

## 2019-12-26 VITALS — BP 114/72 | HR 104 | Wt 153.8 lb

## 2019-12-26 DIAGNOSIS — N761 Subacute and chronic vaginitis: Secondary | ICD-10-CM | POA: Diagnosis not present

## 2019-12-26 DIAGNOSIS — Z3041 Encounter for surveillance of contraceptive pills: Secondary | ICD-10-CM

## 2019-12-26 DIAGNOSIS — Z302 Encounter for sterilization: Secondary | ICD-10-CM

## 2019-12-26 DIAGNOSIS — R6889 Other general symptoms and signs: Secondary | ICD-10-CM | POA: Diagnosis not present

## 2019-12-26 DIAGNOSIS — N898 Other specified noninflammatory disorders of vagina: Secondary | ICD-10-CM

## 2019-12-26 DIAGNOSIS — R232 Flushing: Secondary | ICD-10-CM

## 2019-12-26 LAB — POCT WET PREP (WET MOUNT)
Clue Cells Wet Prep Whiff POC: NEGATIVE
Trichomonas Wet Prep HPF POC: ABSENT

## 2019-12-26 MED ORDER — NORGESTIMATE-ETH ESTRADIOL 0.25-35 MG-MCG PO TABS: 1.0000 | ORAL_TABLET | Freq: Every day | ORAL | 3 refills | Status: DC

## 2019-12-26 NOTE — Progress Notes (Deleted)
    SUBJECTIVE:   CHIEF COMPLAINT / HPI:   ***  PERTINENT  PMH / PSH: ***  OBJECTIVE:   BP 114/72   Pulse (!) 104   Wt 153 lb 12.8 oz (69.8 kg)   LMP 12/25/2019   SpO2 98%   BMI 31.06 kg/m   ***  ASSESSMENT/PLAN:   No problem-specific Assessment & Plan notes found for this encounter.     [place]

## 2019-12-26 NOTE — Progress Notes (Signed)
° ° °  SUBJECTIVE:   CHIEF COMPLAINT / HPI:   Menstrual cycle irregular since onset.  Patient wishes to have regular cycles and also prevent pregnancy.  She had a recent miscarriage and was started on NuvaRing but discontinued that form of birth control after less than a month due to causing vaginal irritation. She has also tried depo in the past but had a bad reaction.  She had a IUD but felt that it was uncomfortable. Wants to try oral pills.  Takes celexa daily. On current period. Started yesterday.  Took plan b 1 week ago.  She is married and not desiring any further pregnancies and would ideally like to have permanent sterilization.  Vaginal irritation-chronic, frequent yeast infections.  Denies use of fragrant soaps or topicals in her pelvic area.  Hot flashes- hot at night. Has unintentional weight loss. TSH checked 03/2019 and was normal.  Would like to check her hormone levels today.  OBJECTIVE:   BP 114/72    Pulse (!) 104    Wt 153 lb 12.8 oz (69.8 kg)    LMP 12/25/2019    SpO2 98%    BMI 31.06 kg/m   General: NAD, pleasant, able to participate in exam Extremities: no edema or cyanosis. WWP. Pelvic: negative for external abnormalities, vaginal canal has bleeding without excessive discharge Skin: warm and dry, no rashes noted Neuro: alert and oriented x4, no focal deficits Psych: Normal affect and mood  ASSESSMENT/PLAN:   Contraception management Discussed several options for birth control including permanent sterilization. Suggested that they also consider vasectomy for her husband.  Today, patient is interested in starting an oral birth control pill.  Discussed that she can start the day after her period but that she should continue using a backup method for a couple of weeks. - referral to OB/gyn to discuss sterilization - sprintec sent to pharmacy  Vaginitis Wet prep positive for yeast - diflucan sent to pharmacy - counseled on preventative measures for recurrent yeast  infections - return if not improving with treatment  Sensation of feeling hot Checked TSH, CBC Both are normal Possible environmental or physiological sensation Return if not improved in the winter months     Richarda Osmond, [place]

## 2019-12-26 NOTE — Patient Instructions (Addendum)
It was a pleasure to see you today!  To summarize our discussion for this visit:  We are testing for causes of vaginal irritation by a swab as well as a cause of your overheating symptoms with blood work. I will call you with the results of these test.   For birth control, I will prescribe an oral pill as we discussed and send a referral to OB/GYN to discuss BTL (tubes tied).You can start the day your menstrual cycle ends Please use back up method for 1-2 weeks.   Call the clinic at [phone] if your symptoms worsen or you have any concerns.   Thank you for allowing me to take part in your care,  Dr. Doristine Mango

## 2019-12-27 ENCOUNTER — Ambulatory Visit: Payer: Medicaid Other

## 2019-12-27 LAB — CBC
Hematocrit: 38.4 % (ref 34.0–46.6)
Hemoglobin: 13.5 g/dL (ref 11.1–15.9)
MCH: 28.2 pg (ref 26.6–33.0)
MCHC: 35.2 g/dL (ref 31.5–35.7)
MCV: 80 fL (ref 79–97)
Platelets: 366 10*3/uL (ref 150–450)
RBC: 4.78 x10E6/uL (ref 3.77–5.28)
RDW: 14.4 % (ref 11.7–15.4)
WBC: 5.6 10*3/uL (ref 3.4–10.8)

## 2019-12-27 LAB — TSH: TSH: 1.53 u[IU]/mL (ref 0.450–4.500)

## 2019-12-29 ENCOUNTER — Other Ambulatory Visit: Payer: Self-pay

## 2019-12-30 DIAGNOSIS — R6889 Other general symptoms and signs: Secondary | ICD-10-CM | POA: Insufficient documentation

## 2019-12-30 DIAGNOSIS — N760 Acute vaginitis: Secondary | ICD-10-CM | POA: Insufficient documentation

## 2019-12-30 MED ORDER — FLUCONAZOLE 150 MG PO TABS: 150.0000 mg | ORAL_TABLET | Freq: Once | ORAL | 1 refills | Status: AC

## 2019-12-30 NOTE — Assessment & Plan Note (Signed)
Discussed several options for birth control including permanent sterilization. Suggested that they also consider vasectomy for her husband.  Today, patient is interested in starting an oral birth control pill.  Discussed that she can start the day after her period but that she should continue using a backup method for a couple of weeks. - referral to OB/gyn to discuss sterilization - sprintec sent to pharmacy

## 2019-12-30 NOTE — Assessment & Plan Note (Addendum)
Checked TSH, CBC Both are normal Possible environmental or physiological sensation Return if not improved in the winter months

## 2019-12-30 NOTE — Assessment & Plan Note (Signed)
Wet prep positive for yeast - diflucan sent to pharmacy - counseled on preventative measures for recurrent yeast infections - return if not improving with treatment

## 2020-01-28 DIAGNOSIS — M545 Low back pain: Secondary | ICD-10-CM | POA: Diagnosis not present

## 2020-01-28 DIAGNOSIS — R5383 Other fatigue: Secondary | ICD-10-CM | POA: Diagnosis not present

## 2020-01-28 DIAGNOSIS — N3000 Acute cystitis without hematuria: Secondary | ICD-10-CM | POA: Diagnosis not present

## 2020-01-28 DIAGNOSIS — G8929 Other chronic pain: Secondary | ICD-10-CM | POA: Diagnosis not present

## 2020-01-28 DIAGNOSIS — R109 Unspecified abdominal pain: Secondary | ICD-10-CM | POA: Diagnosis not present

## 2020-01-29 NOTE — Progress Notes (Deleted)
    SUBJECTIVE:   CHIEF COMPLAINT / HPI: "feeling off with medication"   ***  PERTINENT  PMH / PSH: ***  OBJECTIVE:   There were no vitals taken for this visit.  ***  ASSESSMENT/PLAN:   No problem-specific Assessment & Plan notes found for this encounter.     Wanda Nixon, [place]

## 2020-01-30 ENCOUNTER — Ambulatory Visit: Payer: Medicaid Other

## 2020-02-24 ENCOUNTER — Encounter: Payer: Medicaid Other | Admitting: Obstetrics and Gynecology

## 2020-04-15 ENCOUNTER — Encounter: Payer: Self-pay | Admitting: Family Medicine

## 2020-04-16 ENCOUNTER — Other Ambulatory Visit: Payer: Self-pay | Admitting: *Deleted

## 2020-04-16 ENCOUNTER — Ambulatory Visit: Payer: Medicaid Other | Admitting: Family Medicine

## 2020-04-16 ENCOUNTER — Other Ambulatory Visit: Payer: Self-pay

## 2020-04-16 VITALS — BP 124/92 | HR 92 | Ht 59.0 in | Wt 151.4 lb

## 2020-04-16 DIAGNOSIS — Z309 Encounter for contraceptive management, unspecified: Secondary | ICD-10-CM

## 2020-04-16 DIAGNOSIS — F411 Generalized anxiety disorder: Secondary | ICD-10-CM

## 2020-04-16 LAB — POCT URINE PREGNANCY: Preg Test, Ur: NEGATIVE

## 2020-04-16 MED ORDER — CITALOPRAM HYDROBROMIDE 40 MG PO TABS: 40.0000 mg | ORAL_TABLET | Freq: Every day | ORAL | 2 refills | Status: DC

## 2020-04-16 MED ORDER — MEDROXYPROGESTERONE ACETATE 150 MG/ML IM SUSP
150.0000 mg | Freq: Once | INTRAMUSCULAR | Status: AC
  Administered 2020-04-16: 150 mg via INTRAMUSCULAR

## 2020-04-16 NOTE — Patient Instructions (Signed)
It was great to see you! Thank you for allowing me to participate in your care!  Our plans for today:  -Today we gave you a Depo shot.  We are scheduling you for our women's health clinic on January 6 to have the copper IUD placed. -If you have any questions or concerns before then do not hesitate to reach out.  I hope you have happy holidays!  Take care and seek immediate care sooner if you develop any concerns.   [place]

## 2020-04-16 NOTE — Assessment & Plan Note (Addendum)
Discussion of options including mirena, Skyla, Nexplanon, depo, OCP and Paraguard. She has difficulty getting off from work and we do not have a [place] in [place] currently.  She decided on coverage with depo provera for today, with planned return Jan 6 for placement of Paraguard IUD.  30 minute discussion time.

## 2020-04-16 NOTE — Progress Notes (Signed)
Urine pregnancy obtained and negative. Depo given in [place] today.  Site unremarkable & patient tolerated injection.    Patient scheduled for colpo clinic on 1/6 for copper IUD placement per Dr. Nori Riis.   Talbot Grumbling, RN

## 2020-04-16 NOTE — Progress Notes (Signed)
    CHIEF COMPLAINT / HPI: Patient desires contraception. Considering IUD but wants one with low hormone dose. From telephone call yesterday:   " A few months back we discussed my birth control options and I am waiting to go with the IUD. Which ever has the least hormones that won't cause my anxiety to act up. However I want the most effective one that will help me avoid pregnancy for awhile. I was proscribed the pills and it hasn't been the best option as I haven't even started them."     PERTINENT  PMH / PSH: I have reviewed the patient's medications, allergies, past medical and surgical history, smoking status and updated in the EMR as appropriate. Per chart note on 09/16/2019: "She has previously had the IUD and had to have it removed due to abdominal pain. She states she previously had  Depo which caused weight gain."   OBJECTIVE:  BP (!) 124/92   Pulse 92   Ht 4\' 11"  (1.499 m)   Wt 151 lb 6.4 oz (68.7 kg)   LMP 04/12/2020   SpO2 99%   BMI 30.58 kg/m    ASSESSMENT / PLAN:   Contraception management Discussion of options including mirena, Skyla, Nexplanon, depo, OCP and Paraguard. She has difficulty getting off from work and we do not have a [place] in [place] currently.  She decided on coverage with depo provera for today, with planned return Jan 6 for placement of Paraguard IUD.  30 minute discussion time.   Dorcas Mcmurray MD

## 2020-05-03 DIAGNOSIS — Z91018 Allergy to other foods: Secondary | ICD-10-CM | POA: Diagnosis not present

## 2020-05-03 DIAGNOSIS — T24111A Burn of first degree of right thigh, initial encounter: Secondary | ICD-10-CM | POA: Diagnosis not present

## 2020-05-03 DIAGNOSIS — Z888 Allergy status to other drugs, medicaments and biological substances status: Secondary | ICD-10-CM | POA: Diagnosis not present

## 2020-05-03 DIAGNOSIS — T310 Burns involving less than 10% of body surface: Secondary | ICD-10-CM | POA: Diagnosis not present

## 2020-05-05 ENCOUNTER — Other Ambulatory Visit: Payer: Self-pay

## 2020-05-05 MED ORDER — ALBUTEROL SULFATE HFA 108 (90 BASE) MCG/ACT IN AERS: 2.0000 | INHALATION_SPRAY | Freq: Four times a day (QID) | RESPIRATORY_TRACT | 2 refills | Status: DC | PRN

## 2020-05-07 ENCOUNTER — Ambulatory Visit: Payer: Medicaid Other

## 2020-05-12 NOTE — Progress Notes (Deleted)
    SUBJECTIVE:   CHIEF COMPLAINT / HPI:   Burn on Leg ***  Pap smear*** COVID***  PERTINENT  PMH / PSH: Migraine, asthma, eczema, generalized anxiety disorder  OBJECTIVE:   There were no vitals taken for this visit.   Physical Exam: *** General: 30 y.o. female in NAD Cardio: RRR no m/r/g Lungs: CTAB, no wheezing, no rhonchi, no crackles, no IWOB on *** Abdomen: Soft, non-tender to palpation, non-distended, positive bowel sounds Skin: warm and dry Extremities: No edema   ASSESSMENT/PLAN:   No problem-specific Assessment & Plan notes found for this encounter.     Cleophas Dunker, [place]

## 2020-05-13 ENCOUNTER — Ambulatory Visit: Payer: Medicaid Other | Admitting: Family Medicine

## 2020-05-14 ENCOUNTER — Ambulatory Visit: Payer: Medicaid Other

## 2020-06-11 ENCOUNTER — Ambulatory Visit: Payer: Medicaid Other | Admitting: Family Medicine

## 2020-06-11 ENCOUNTER — Encounter: Payer: Self-pay | Admitting: Family Medicine

## 2020-06-11 ENCOUNTER — Other Ambulatory Visit: Payer: Self-pay

## 2020-06-11 VITALS — BP 118/80 | HR 85 | Wt 150.0 lb

## 2020-06-11 DIAGNOSIS — Z975 Presence of (intrauterine) contraceptive device: Secondary | ICD-10-CM | POA: Diagnosis not present

## 2020-06-11 LAB — POCT URINE PREGNANCY: Preg Test, Ur: NEGATIVE

## 2020-06-11 MED ORDER — CLONAZEPAM 0.5 MG PO TABS: 0.5000 mg | ORAL_TABLET | Freq: Two times a day (BID) | ORAL | 0 refills | Status: DC | PRN

## 2020-06-11 NOTE — Patient Instructions (Signed)

## 2020-06-11 NOTE — Progress Notes (Signed)
Patient desires Paraguard IUD placement. Has had progesterone IUD previously and di not like the way ot made her feel. All questions answered. Pregnancy test negative.  IUD INSERTION: Patient given informed consent, signed copy in the chart..  Negative pregnancy confirmed.  Appropriate time out taken.   Sterile instruments and technique was used. Cervix brought into view with use of speculum and then cleansed three times with  betadine swabs.  A tenaculum was placed into the anterior lip of the cervix and a uterine sound was used to measure uterine size.   A Paraguard IUD was placed into the endometrial cavity, deployed and secured. The applicator was removed. The strings were trimmed to 2 centimeters.   There were no complications and the patient tolerated the procedure well.   She was given handouts for post procedure instructions and information about the IUD including a card with the time of recommended removal. She was reminded that the IUD does not protect against sexually transmitted diseases.

## 2020-06-12 ENCOUNTER — Other Ambulatory Visit: Payer: Self-pay | Admitting: Family Medicine

## 2020-06-12 DIAGNOSIS — G8918 Other acute postprocedural pain: Secondary | ICD-10-CM

## 2020-06-12 MED ORDER — NAPROXEN 500 MG PO TABS: 500.0000 mg | ORAL_TABLET | Freq: Two times a day (BID) | ORAL | 0 refills | Status: DC

## 2020-06-12 NOTE — Progress Notes (Signed)
Pain after IUD insertion

## 2020-06-22 ENCOUNTER — Other Ambulatory Visit: Payer: Self-pay | Admitting: *Deleted

## 2020-06-23 MED ORDER — TRIAMCINOLONE ACETONIDE 0.1 % EX CREA: 1.0000 "application " | TOPICAL_CREAM | Freq: Two times a day (BID) | CUTANEOUS | 0 refills | Status: DC

## 2020-07-07 ENCOUNTER — Other Ambulatory Visit: Payer: Self-pay | Admitting: Family Medicine

## 2020-07-14 ENCOUNTER — Encounter: Payer: Self-pay | Admitting: Family Medicine

## 2020-07-14 NOTE — Telephone Encounter (Signed)
Called patient to gather more information. Patient reports bleeding since IUD insertion. Reports that bleeding has been random in amount and has some amount of bleeding every day. Some days are heavy like menstrual period, others are light spotting.   Reports intermittent abdominal cramping. Reports that she has also had intermittent lightheadedness since having the IUD inserted. Offered to schedule same day appointment for today. Patient is unable to come in today due to work schedule. Scheduled patient in ATC clinic on Friday afternoon for follow up.   Provided with strict ED precautions in the meantime. Patient verbalizes understanding.   Talbot Grumbling, RN

## 2020-07-16 NOTE — Progress Notes (Signed)
    SUBJECTIVE:   CHIEF COMPLAINT / HPI: bleeding after IUD insertion   Vaginal Bleeding  Patient reports that she experienced vaginal bleeding after IUD insertion for about a month now. She reports she has been having bleeding since Dec. IUD was inserted on 2/10. She is saturating 1-3 pads per day since this occurred. She reports having no more than 1 day of bleeding cessation. Color ranges from bright red to dark brown, sometimes it is pink. Often has clots and dark red. She reports feeling fatigued. She would like to have it removed and start OCPs. She is also having cramping.   PERTINENT  PMH / PSH:  AUB   OBJECTIVE:   BP 124/72   Pulse 91   Wt 152 lb 9.6 oz (69.2 kg)   SpO2 92%   BMI 30.82 kg/m   General: female appearing stated age in no acute distress HEENT: MMM Cardio: RRR  Pulm: normal resp effort  Genitalia:  Normal introitus for age, no external lesions, bleeding pooled in vagina vault, mucosa pink and moist, no vaginal or cervical lesions, no vaginal atrophy, no friaility      ASSESSMENT/PLAN:   Vaginal bleeding Patient requesting removal of paraguard IUD.  IUD removed using ringed forceps. Prescribed Loestrin Fe  CBC due to prolonged bleeding   PROCEDURE NOTE: IUD removal Patient given informed consent for IUD removal. She is aware this will stop the birth control method provided by the IUD immediately. Patient placed in the lithotomy position and the cervix brought into view using speculum. The IUD strings were identified coming from the cervical os. These strings were grasped with ring forceps, and the IUD withdrawn gently from the uterus. There were no complications. Patient has had prolonged bleeding prior to this procedure, no increased bleeding was noted before at completion of procedure. Patient tolerated the procedure well.      Eulis Foster, MD [place]

## 2020-07-17 ENCOUNTER — Ambulatory Visit: Payer: Medicaid Other | Admitting: Family Medicine

## 2020-07-17 ENCOUNTER — Other Ambulatory Visit: Payer: Self-pay

## 2020-07-17 VITALS — BP 124/72 | HR 91 | Wt 152.6 lb

## 2020-07-17 DIAGNOSIS — N939 Abnormal uterine and vaginal bleeding, unspecified: Secondary | ICD-10-CM | POA: Diagnosis not present

## 2020-07-17 MED ORDER — NORETHIN ACE-ETH ESTRAD-FE 1-20 MG-MCG PO TABS: 1.0000 | ORAL_TABLET | Freq: Every day | ORAL | 11 refills | Status: DC

## 2020-07-17 NOTE — Patient Instructions (Addendum)
Today we will check your blood counts to make sure you are not anemic.  I have prescribed Loestrin with iron tablets on the days that you will have your menses.  Please take pills per package instructions.  Recommend following up with [place] in 1 month.   Norethindrone Acetate; Ethinyl Estradiol; Ferrous Fumarate Capsules or Tablets What is this medicine? NORETHINDRONE; ETHINYL ESTRADIOL; FERROUS FUMARATE (nor eth IN drone; ETH in il es tra DYE ole; FER [place] FUE ma rate) is an oral contraceptive. The products combine two types of female hormones, an estrogen and a progestin. These products prevent ovulation and pregnancy. This medicine may be used for other purposes; ask your health care provider or pharmacist if you have questions. COMMON BRAND NAME(S): [place]a [address] 1/20, [address], [place] [address], [address] Fe, [place] Fe, [place], Gildess 24 Fe, Gildess Fe 1.5/30, Gildess Fe 1/20, Hailey 24 Fe, Hailey Fe 1.5/30, Junel Fe 1.5/30, Junel Fe 1/20, Junel Fe 24, [place] Fe, Lo Loestrin Fe, Loestrin 24 Fe, Loestrin FE 1.5/30, Loestrin FE 1/20, Lomedia 24 Fe, [place], Microgestin 24 Fe, Microgestin Fe 1.5/30, Microgestin Fe 1/20, Tarina 24 Fe, Tarina Fe 1/20, Taysofy, Taytulla, Tilia Fe, Tri-Legest Fe What should I tell my health care provider before I take this medicine? They need to know if you have any of these conditions:  abnormal vaginal bleeding  blood vessel disease or blood clots  breast, cervical, endometrial, ovarian, liver, or uterine cancer  diabetes  gallbladder disease  having surgery  heart disease or recent heart attack  high blood pressure  high cholesterol or triglycerides  history of irregular heartbeat or heart valve problems  kidney disease  liver disease  migraine headaches  protein C deficiency  protein S deficiency  recently had a baby, miscarriage, or abortion  stroke  systemic lupus erythematosus (SLE)  tobacco smoker  an unusual or allergic  reaction to estrogens, progestins, other medicines, foods, dyes, or preservatives  pregnant or trying to get pregnant  breast-feeding How should I use this medicine? Take this medicine by mouth. To reduce nausea, this medicine may be taken with food. Follow the directions on the prescription label. Take this medicine at the same time each day and in the order directed on the package. Do not take your medicine more often than directed. A patient package insert for the product will be given with each prescription and refill. Read this sheet carefully each time. The sheet may change frequently. Contact your pediatrician regarding the use of this medicine in children. Special care may be needed. This medicine has been used in female children who have started having menstrual periods. Overdosage: If you think you have taken too much of this medicine contact a poison control center or emergency room at once. NOTE: This medicine is only for you. Do not share this medicine with others. What if I miss a dose? If you miss a dose, refer to the patient information sheet you received with your medication for direction. If you miss more than one pill, this medication may not be as effective, and you may need to use another form of birth control. What may interact with this medicine? Do not take this medicine with the following medication:  dasabuvir; ombitasvir; paritaprevir; ritonavir  ombitasvir; paritaprevir; ritonavir This medicine may also interact with the following medications:  acetaminophen  antibiotics or medicines for infections, especially rifampin, rifabutin, rifapentine, and griseofulvin, and possibly penicillins or tetracyclines  aprepitant  ascorbic acid (vitamin C)  atorvastatin  barbiturate  medicines, such as phenobarbital  bosentan  carbamazepine  caffeine  clofibrate  cyclosporine  dantrolene  doxercalciferol  felbamate  grapefruit  juice  hydrocortisone  medicines for anxiety or sleeping problems, such as diazepam or temazepam  medicines for diabetes, including pioglitazone  mineral oil  modafinil  mycophenolate  nefazodone  oxcarbazepine  phenytoin  prednisolone  ritonavir or other medicines for HIV infection or AIDS  rosuvastatin  selegiline  soy isoflavones supplements  St. John's wort  tamoxifen or raloxifene  theophylline  thyroid hormones  topiramate  warfarin This list may not describe all possible interactions. Give your health care provider a list of all the medicines, herbs, non-prescription drugs, or dietary supplements you use. Also tell them if you smoke, drink alcohol, or use illegal drugs. Some items may interact with your medicine. What should I watch for while using this medicine? Visit your doctor or health care professional for regular checks on your progress. You will need a regular breast and pelvic exam and Pap smear while on this medicine. Use an additional method of contraception during the first cycle that you take these tablets. If you have any reason to think you are pregnant, stop taking this medicine right away and contact your doctor or health care professional. If you are taking this medicine for hormone related problems, it may take several cycles of use to see improvement in your condition. Smoking increases the risk of getting a blood clot or having a stroke while you are taking birth control pills, especially if you are more than 29 years old. You are strongly advised not to smoke. This medicine can make your body retain fluid, making your fingers, hands, or ankles swell. Your blood pressure can go up. Contact your doctor or health care professional if you feel you are retaining fluid. This medicine can make you more sensitive to the sun. Keep out of the sun. If you cannot avoid being in the sun, wear protective clothing and use sunscreen. Do not use sun lamps  or tanning beds/booths. If you wear contact lenses and notice visual changes, or if the lenses begin to feel uncomfortable, consult your eye care specialist. In some women, tenderness, swelling, or minor bleeding of the gums may occur. Notify your dentist if this happens. Brushing and flossing your teeth regularly may help limit this. See your dentist regularly and inform your dentist of the medicines you are taking. If you are going to have elective surgery, you may need to stop taking this medicine before the surgery. Consult your health care professional for advice. This medicine does not protect you against HIV infection (AIDS) or any other sexually transmitted diseases. What side effects may I notice from receiving this medicine? Side effects that you should report to your doctor or health care professional as soon as possible:  allergic reactions such as skin rash or itching, hives, swelling of the lips, mouth, tongue, or throat  breast tissue changes or discharge  dark patches of skin on your forehead, cheeks, upper lip, and chin  depression  high blood pressure  migraines or severe, sudden headaches  signs and symptoms of a blood clot such as breathing problems; changes in vision; chest pain; severe, sudden headache; pain, swelling, warmth in the leg; trouble speaking; sudden numbness or weakness of the face, arm or leg  stomach pain  symptoms of vaginal infection like itching, irritation or unusual discharge  yellowing of the eyes or skin Side effects that usually do not require medical attention (  report these to your doctor or health care professional if they continue or are bothersome):  acne  breast pain, tenderness  irregular vaginal bleeding or spotting, particularly during the first month of use  mild headache  nausea  weight gain (slight) This list may not describe all possible side effects. Call your doctor for medical advice about side effects. You may report  side effects to FDA at 1-800-FDA-1088. Where should I keep my medicine? Keep out of the reach of children. Store at room temperature between 15 and 30 degrees C (59 and 86 degrees F). Throw away any unused medicine after the expiration date. NOTE: This sheet is a summary. It may not cover all possible information. If you have questions about this medicine, talk to your doctor, pharmacist, or health care provider.  2021 Elsevier/Gold Standard (2020-03-09 12:27:45)

## 2020-07-18 LAB — CBC WITH DIFFERENTIAL
Basophils Absolute: 0 10*3/uL (ref 0.0–0.2)
Basos: 1 %
EOS (ABSOLUTE): 0.1 10*3/uL (ref 0.0–0.4)
Eos: 1 %
Hematocrit: 38.4 % (ref 34.0–46.6)
Hemoglobin: 13 g/dL (ref 11.1–15.9)
Immature Grans (Abs): 0 10*3/uL (ref 0.0–0.1)
Immature Granulocytes: 0 %
Lymphocytes Absolute: 1.2 10*3/uL (ref 0.7–3.1)
Lymphs: 16 %
MCH: 27.6 pg (ref 26.6–33.0)
MCHC: 33.9 g/dL (ref 31.5–35.7)
MCV: 82 fL (ref 79–97)
Monocytes Absolute: 0.5 10*3/uL (ref 0.1–0.9)
Monocytes: 7 %
Neutrophils Absolute: 5.9 10*3/uL (ref 1.4–7.0)
Neutrophils: 75 %
RBC: 4.71 x10E6/uL (ref 3.77–5.28)
RDW: 13.6 % (ref 11.7–15.4)
WBC: 7.8 10*3/uL (ref 3.4–10.8)

## 2020-07-19 DIAGNOSIS — N939 Abnormal uterine and vaginal bleeding, unspecified: Secondary | ICD-10-CM | POA: Insufficient documentation

## 2020-07-19 NOTE — Assessment & Plan Note (Signed)
Patient requesting removal of paraguard IUD.  IUD removed using ringed forceps. Prescribed Loestrin Fe  CBC due to prolonged bleeding   PROCEDURE NOTE: IUD removal Patient given informed consent for IUD removal. She is aware this will stop the birth control method provided by the IUD immediately. Patient placed in the lithotomy position and the cervix brought into view using speculum. The IUD strings were identified coming from the cervical os. These strings were grasped with ring forceps, and the IUD withdrawn gently from the uterus. There were no complications. Patient has had prolonged bleeding prior to this procedure, no increased bleeding was noted before at completion of procedure. Patient tolerated the procedure well.

## 2020-07-20 ENCOUNTER — Encounter: Payer: Self-pay | Admitting: Family Medicine

## 2020-08-16 ENCOUNTER — Encounter: Payer: Self-pay | Admitting: Family Medicine

## 2020-08-17 ENCOUNTER — Other Ambulatory Visit: Payer: Self-pay | Admitting: Family Medicine

## 2020-08-17 DIAGNOSIS — F411 Generalized anxiety disorder: Secondary | ICD-10-CM

## 2020-08-17 MED ORDER — CITALOPRAM HYDROBROMIDE 40 MG PO TABS: 40.0000 mg | ORAL_TABLET | Freq: Every day | ORAL | 1 refills | Status: DC

## 2020-08-18 ENCOUNTER — Telehealth: Payer: Self-pay | Admitting: *Deleted

## 2020-08-18 NOTE — Telephone Encounter (Signed)
Opened in error.Wanda Nixon, CMA  

## 2020-08-19 NOTE — Progress Notes (Signed)
Subjective:   Patient ID: Wanda Nixon    DOB: 09-17-1990, 30 y.o. female   MRN: [id number]  Wanda Nixon is a 30 y.o. female with a history of migraine, asthma, eczema, GAD, situational anxiety here for anxiety  GAD  Situational Anxiety: Patient has long history of GAD currently on Celexa 40mg  QD. She notes increase in her anxiety since starting her new job. She notes worsening anxiety with panic attacks since starting her new job. She constantly feels anxious. She notes waking up feeling anxious. 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 She feels like her anxiety is affecting her mood and making her more depressed and hopeless. She feels like her anxiety is completely uncontrolled at this ttime. She is traveling to [place] and often has good symptom control with Klonopin. GAD-7: 20  PHQ-9 score: 17 with negative to #9  Has been given Klonopin in past for situational anxiety for air transportation. She has also been treated with Propranolol 10mg  to be taken 30 minutes prior to public speaking events. She has not tried this yet but plans to try it soon. She has been given Hydroxyzine 10mg  TID PRN for increased anxiety. She notes it makes her super sleepy which makes her unfunctional. She has established care with a therapist which she starts next week.    Review of Systems:  Per HPI.   Objective:   BP 123/75   Pulse 86   Ht 4\' 11"  (1.499 m)   Wt 152 lb 12.8 oz (69.3 kg)   SpO2 99%   BMI 30.86 kg/m  Vitals and nursing note reviewed.  General: pleasant young female, sitting comfortably in exam chair, well nourished, well developed, in no acute distress with non-toxic appearance Resp: breathing comfortably on room air, speaking in full sentences Extremities:  normal tone TMA:UQJF normal Neuro: Alert and oriented, speech normal  Depression screen [facility] 2/9 08/21/2020 07/17/2020 06/11/2020  Decreased Interest 2 1 0  Down, Depressed, Hopeless  3 0 0  PHQ - 2 Score 5 1 0  Altered sleeping 2 1 -  Tired, decreased energy 1 2 -  Change in appetite 2 0 -  Feeling bad or failure about yourself  3 0 -  Trouble concentrating 0 1 -  Moving slowly or fidgety/restless 0 0 -  Suicidal thoughts 0 0 -  PHQ-9 Score 13 5 -  Difficult doing work/chores Very difficult Somewhat difficult -  Some recent data might be hidden   GAD 7 : Generalized Anxiety Score 08/21/2020 03/22/2019 06/19/2018  Nervous, Anxious, on Edge 3 3 3   Control/stop worrying 2 2 3   Worry too much - different things 3 2 3   Trouble relaxing 3 2 3   Restless 2 2 3   Easily annoyed or irritable 2 2 2   Afraid - awful might happen 2 2 2   Total GAD 7 Score 17 15 19   Anxiety Difficulty Very difficult Very difficult Somewhat difficult   Assessment & Plan:   Generalized anxiety disorder Acute on chronic. Worsening in setting of new job. GAD-7 elevated to 17. Severity of anxiety is no affecting her mood. PHQ-9 score of 13. No SI/HI. Starting therapy next week. History of intolerance to Hydroxyzine. - Continue Celexa 40mg  QD - Start Buspar 10mg  TID - Continue therapy as scheduled - Propranolol PRN for situational public speaking - Klonopin 0.5mg  PRN for situations of increased anxiety. #15 given. PDMP reviewed and no red flags. - Follow up with  PCP in 6-8 weeks for continued monitoring, sooner if worsening  Situational anxiety Plan as above.  No orders of the defined types were placed in this encounter.  Meds ordered this encounter  Medications  . busPIRone (BUSPAR) 10 MG tablet    Sig: Take 1 tablet (10 mg total) by mouth 3 (three) times daily.    Dispense:  90 tablet    Refill:  1  . clonazePAM (KLONOPIN) 0.5 MG tablet    Sig: Take 1 tablet (0.5 mg total) by mouth 2 (two) times daily as needed for anxiety.    Dispense:  15 tablet    Refill:  0      Mina Marble, DO PGY-3, [facility] 08/21/2020 10:56 AM

## 2020-08-21 ENCOUNTER — Other Ambulatory Visit: Payer: Self-pay

## 2020-08-21 ENCOUNTER — Ambulatory Visit (INDEPENDENT_AMBULATORY_CARE_PROVIDER_SITE_OTHER): Payer: Medicaid Other | Admitting: Family Medicine

## 2020-08-21 VITALS — BP 123/75 | HR 86 | Ht 59.0 in | Wt 152.8 lb

## 2020-08-21 DIAGNOSIS — F411 Generalized anxiety disorder: Secondary | ICD-10-CM | POA: Diagnosis present

## 2020-08-21 DIAGNOSIS — F418 Other specified anxiety disorders: Secondary | ICD-10-CM | POA: Diagnosis not present

## 2020-08-21 MED ORDER — BUSPIRONE HCL 10 MG PO TABS: 10.0000 mg | ORAL_TABLET | Freq: Three times a day (TID) | ORAL | 1 refills | Status: DC

## 2020-08-21 MED ORDER — CLONAZEPAM 0.5 MG PO TABS: 0.5000 mg | ORAL_TABLET | Freq: Two times a day (BID) | ORAL | 0 refills | Status: DC | PRN

## 2020-08-21 NOTE — Assessment & Plan Note (Addendum)
Acute on chronic. Worsening in setting of new job. GAD-7 elevated to 17. Severity of anxiety is no affecting her mood. PHQ-9 score of 13. No SI/HI. Starting therapy next week. History of intolerance to Hydroxyzine. - Continue Celexa 40mg  QD - Start Buspar 10mg  TID - Continue therapy as scheduled - Propranolol PRN for situational public speaking - Klonopin 0.5mg  PRN for situations of increased anxiety. #15 given. PDMP reviewed and no red flags. - Follow up with PCP in 6-8 weeks for continued monitoring, sooner if worsening

## 2020-08-21 NOTE — Assessment & Plan Note (Signed)
Plan as above.  

## 2020-08-21 NOTE — Patient Instructions (Signed)
Start Buspirone 10mg  three times a day Continue your Celexa 40mg  daily Use the Clonazepam 0.5mg  as needed for situational anxiety and transportation

## 2020-09-03 ENCOUNTER — Encounter: Payer: Self-pay | Admitting: Family Medicine

## 2020-09-08 IMAGING — US US OB TRANSVAGINAL
1 series · 15 of 28 positions shown · non-contrast
Comparison: 08/19/2019

CLINICAL DATA: Vaginal bleeding in first trimester of pregnancy,
spotting, cramping, with a quantitative beta HCG = 70,050 ; LMP
06/03/2019

EXAM:
TRANSVAGINAL OB ULTRASOUND
TECHNIQUE: Transvaginal ultrasound was performed for complete evaluation of the
gestation as well as the maternal uterus, adnexal regions, and
pelvic cul-de-sac.

[Series 1: us ob transvaginal · 37 acquisitions, 15 frames shown]
[im 1/37]
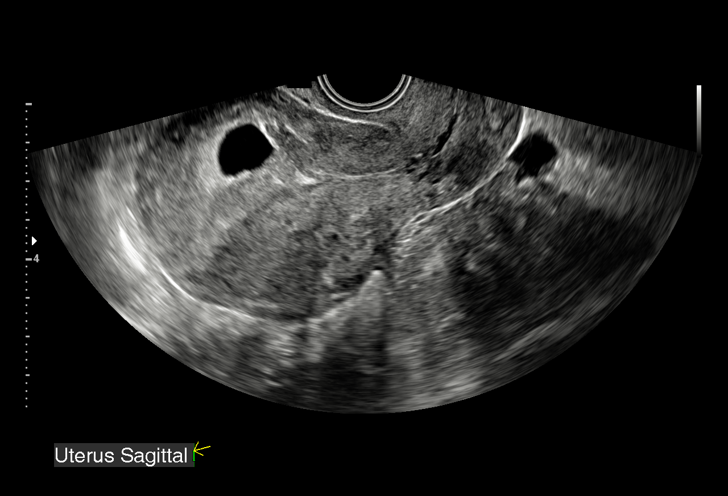
[im 3/37]
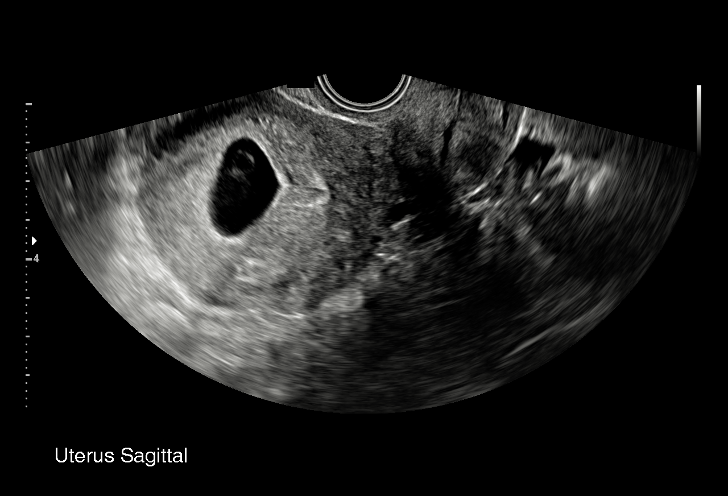
[im 6/37]
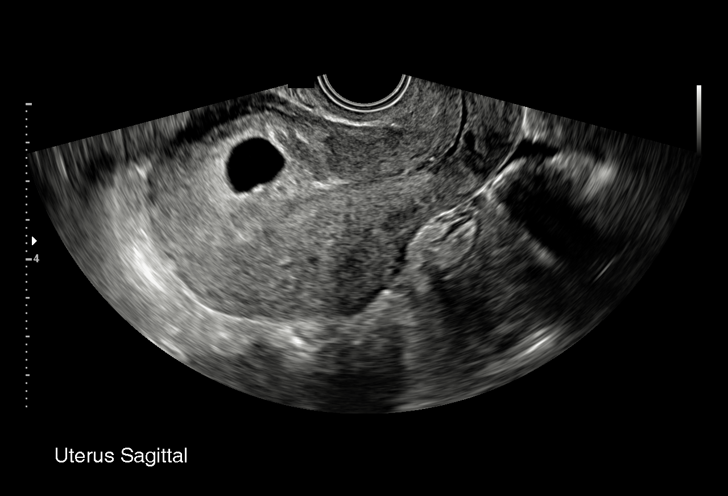
[im 9/37]
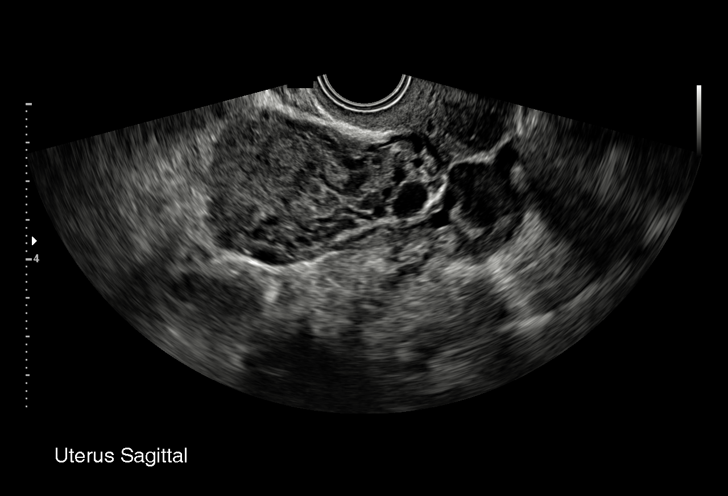
[im 11/37]
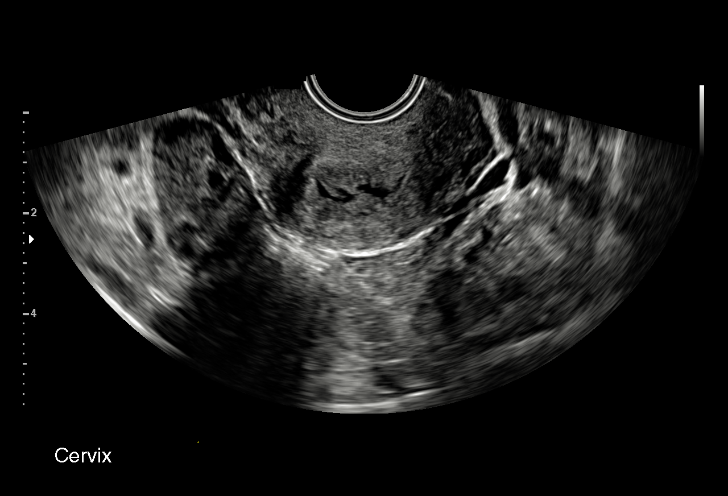
[im 14/37]
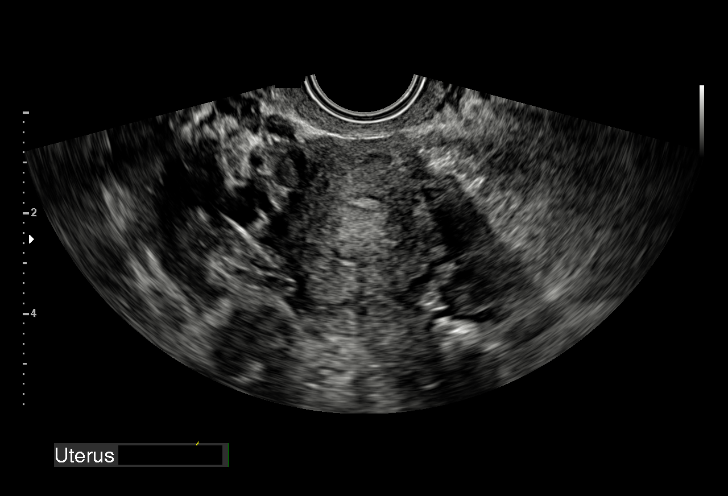
[im 17/37]
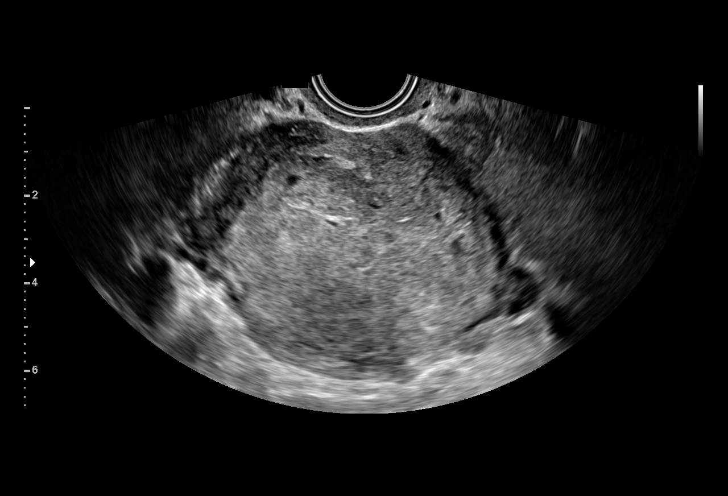
[im 19/37]
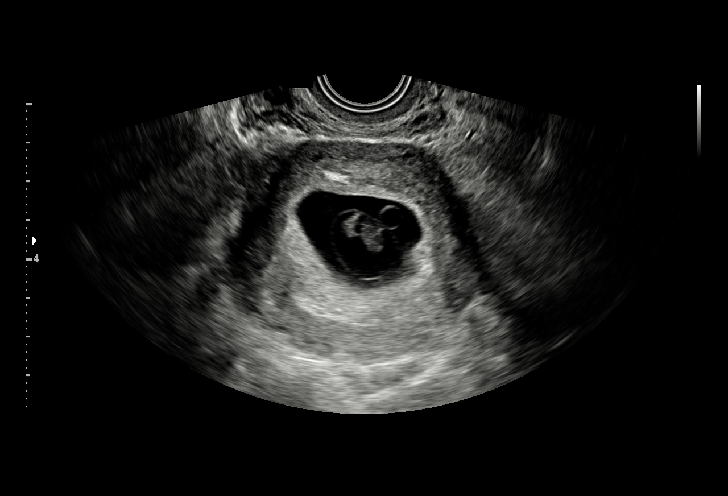
[im 21/37]
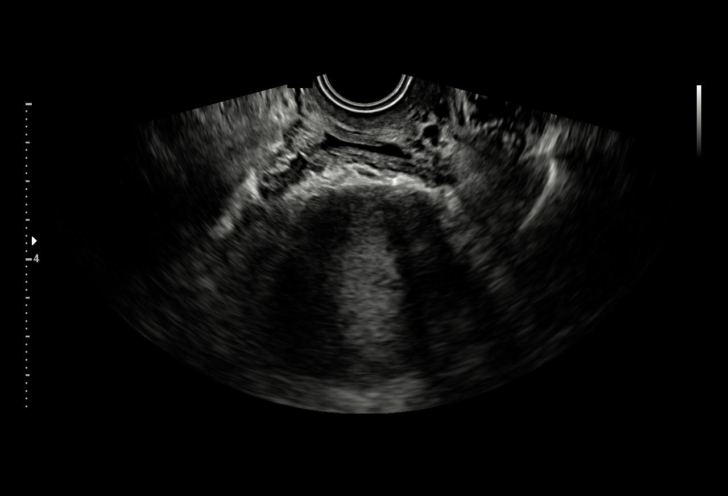
[im 23/37]
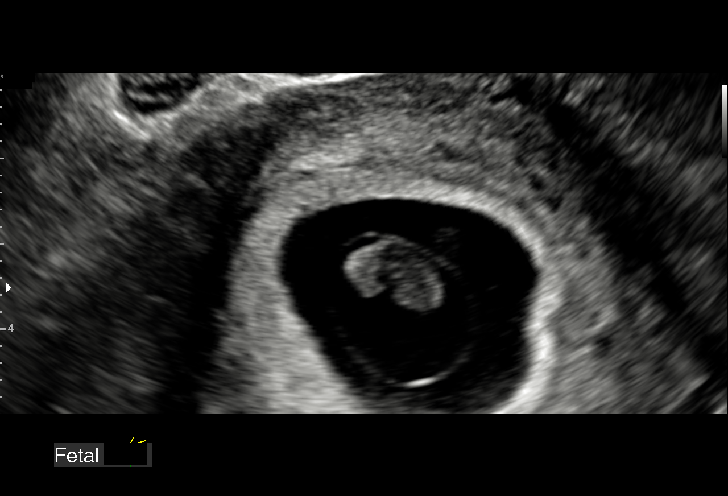
[im 26/37]
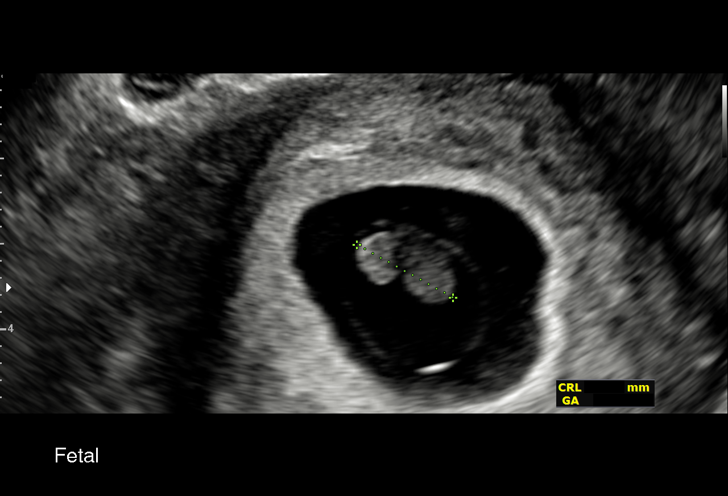
[im 29/37]
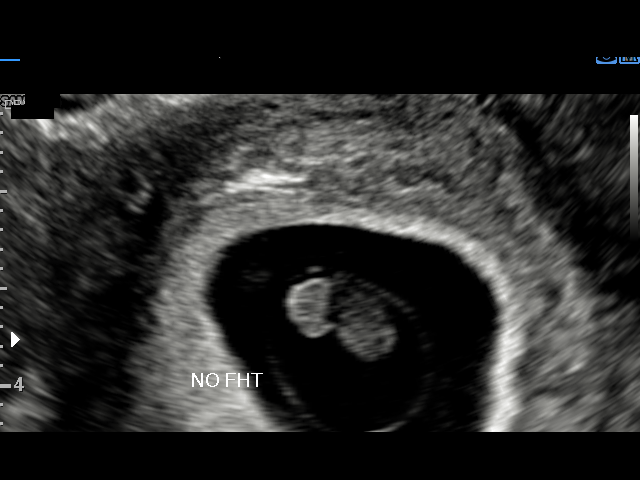
[im 31/37]
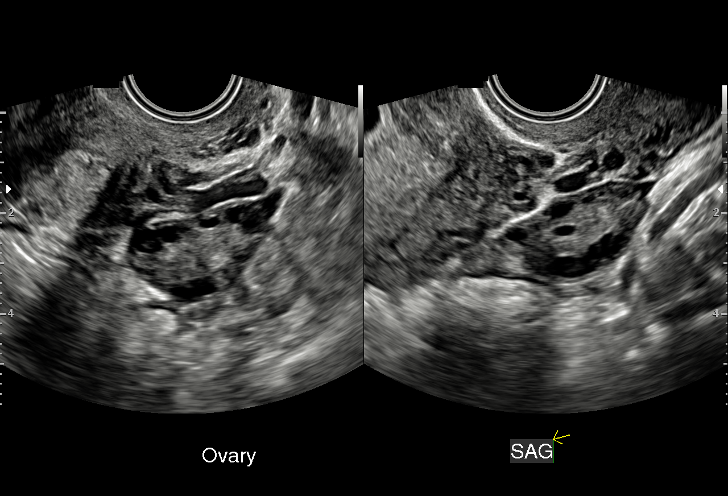
[im 34/37]
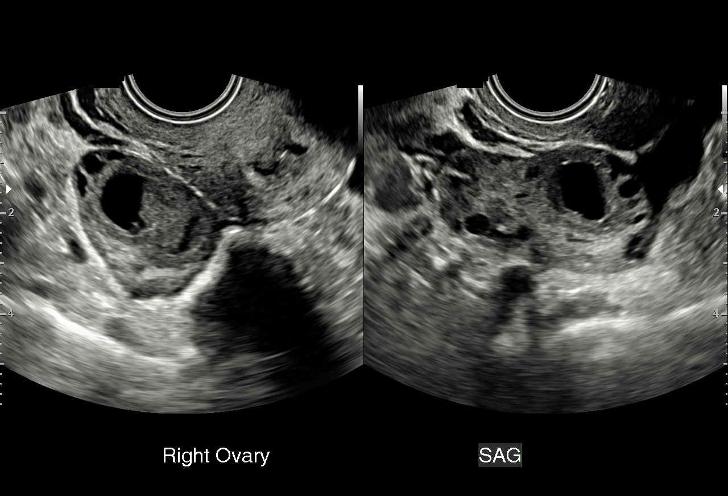
[im 37/37]
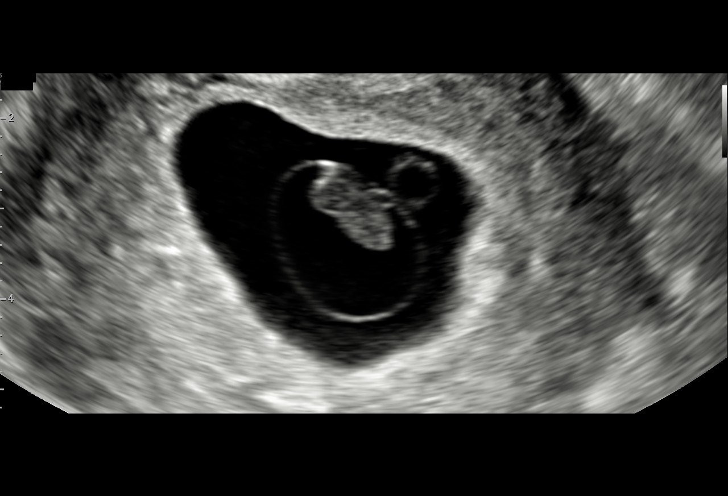

[15 of 28 positions shown; findings below may reference images not displayed]

FINDINGS: Intrauterine gestational sac: Present, single

Yolk sac:  Present

Embryo:  Present

Cardiac Activity: Absent, though was seen previously

Heart Rate: N/A bpm

CRL:   12.5 mm   7 w 3 d                  US EDC: 04/12/2020

Subchorionic hemorrhage:  None visualized.

Maternal uterus/adnexae:

Anteverted uterus, otherwise unremarkable.

No free pelvic fluid or adnexal masses.

LEFT ovary normal size and morphology, 2.8 x 3.1 x 1.7 cm.

RIGHT ovary measures 2.9 x 4.5 x 2.5 cm and contains a small corpus
luteal cyst.
IMPRESSION: Single intrauterine gestation identified.

No fetal cardiac activity is identified.

Findings meet definitive criteria for failed pregnancy.

This follows SRU consensus guidelines: Diagnostic Criteria for
Nonviable Pregnancy Early in the First Trimester. N Engl J Med

## 2020-09-12 ENCOUNTER — Other Ambulatory Visit: Payer: Self-pay | Admitting: Family Medicine

## 2020-09-12 DIAGNOSIS — F411 Generalized anxiety disorder: Secondary | ICD-10-CM

## 2020-10-03 DIAGNOSIS — L309 Dermatitis, unspecified: Secondary | ICD-10-CM | POA: Diagnosis not present

## 2020-10-03 DIAGNOSIS — Z3202 Encounter for pregnancy test, result negative: Secondary | ICD-10-CM | POA: Diagnosis not present

## 2020-10-09 ENCOUNTER — Other Ambulatory Visit: Payer: Self-pay | Admitting: Family Medicine

## 2020-10-09 DIAGNOSIS — F418 Other specified anxiety disorders: Secondary | ICD-10-CM

## 2020-10-26 ENCOUNTER — Other Ambulatory Visit: Payer: Self-pay

## 2020-10-26 ENCOUNTER — Telehealth: Payer: Self-pay

## 2020-10-26 ENCOUNTER — Ambulatory Visit (INDEPENDENT_AMBULATORY_CARE_PROVIDER_SITE_OTHER): Payer: Medicaid Other | Admitting: Family Medicine

## 2020-10-26 VITALS — BP 100/80 | HR 87 | Ht 59.0 in | Wt 154.0 lb

## 2020-10-26 DIAGNOSIS — N939 Abnormal uterine and vaginal bleeding, unspecified: Secondary | ICD-10-CM

## 2020-10-26 DIAGNOSIS — R102 Pelvic and perineal pain: Secondary | ICD-10-CM | POA: Diagnosis not present

## 2020-10-26 LAB — POCT HEMOGLOBIN: Hemoglobin: 12.4 g/dL (ref 11–14.6)

## 2020-10-26 LAB — POCT URINE PREGNANCY: Preg Test, Ur: NEGATIVE

## 2020-10-26 MED ORDER — MELOXICAM 15 MG PO TABS: 15.0000 mg | ORAL_TABLET | Freq: Every day | ORAL | 0 refills | Status: DC

## 2020-10-26 NOTE — Telephone Encounter (Signed)
Informed patient of ultrasound appointment.  11/03/2020 [facility] 0900 w/ arrival at 0830  Full bladder (drink 32 oz water and do not void)  Patient verbalize understanding.  Ozella Almond, [place]

## 2020-10-26 NOTE — Assessment & Plan Note (Signed)
Increased frequency of bleeding since the Depo received in December.  Now s/p ParaGard.  Has not started OCPs.  Discussed with patient that given she does not have a contraindication to hormonal therapy, would recommend this to stop her bleeding.  Pregnancy test negative today.  Offered Megace, however patient would like to start with lower doses.  We will restart her on Loestrin FE.  She will try this for 1 week, but will come back in 1 week if she desires to be started on Megace.  Ultimately, she will likely need to be on hormonal contraception long-term.  Obtaining ultrasound per below.  Scheduled for follow-up on 7/5 with access to care in case she needs Megace and on 7/15 with her new PCP to discuss further.

## 2020-10-26 NOTE — Progress Notes (Signed)
    SUBJECTIVE:   CHIEF COMPLAINT / HPI:   Vaginal Bleeding and Abdominal Pain IUD removed 3/18 and placed on Loestrin Fe She only started about 2 pills but didn't continue because she was worried about her bleeding Was placed in Feb, but had pain and bleeding every day so wanted removed Bleeding and pain has stayed the same The last few days it has gotten worse Hasn't had a normal period She will have random bleeding that lasts 4-5 days, stops 2-3 days Has clotting Uses tampons and pads, goes through 4 or 5 tampons during a day The longest she has gone without bleeding is 4 days Started Depo in December and this has occurred since that time Has dizziness occasionally but improves with water No chest pain or shortness of breath, no palpitations Has been taking tylenol and ibuprofen for pain Takes 800mg  of ibuprofen, does not hurt her stomach Tries to alternate with tylenol, only takes about once a day No history of blood clots Pain is more painful than cramping and feels it suprapubically No fevers, dysuria, flank pain When pain is severe has vomited   PERTINENT  PMH / PSH: Hx migraine without aura, asthma, eczema, GAD  OBJECTIVE:   BP 100/80   Pulse 87   Ht 4\' 11"  (1.499 m)   Wt 154 lb (69.9 kg)   SpO2 99%   BMI 31.10 kg/m    Physical Exam:  General: 30 y.o. female in NAD Cardio: RRR no m/r/g Lungs: CTAB, no wheezing, no rhonchi, no crackles, no IWOB on RA Abdomen: Soft, suprapubic TTP, non-distended, positive bowel sounds Skin: warm and dry Extremities: No edema   Results for orders placed or performed in visit on 10/26/20 (from the past 24 hour(s))  POCT urine pregnancy     Status: None   Collection Time: 10/26/20 11:25 AM  Result Value Ref Range   Preg Test, Ur Negative Negative  Hemoglobin     Status: None   Collection Time: 10/26/20 11:29 AM  Result Value Ref Range   Hemoglobin 12.4 11 - 14.6 g/dL      ASSESSMENT/PLAN:   Vaginal  bleeding Increased frequency of bleeding since the Depo received in December.  Now s/p ParaGard.  Has not started OCPs.  Discussed with patient that given she does not have a contraindication to hormonal therapy, would recommend this to stop her bleeding.  Pregnancy test negative today.  Offered Megace, however patient would like to start with lower doses.  We will restart her on Loestrin FE.  She will try this for 1 week, but will come back in 1 week if she desires to be started on Megace.  Ultimately, she will likely need to be on hormonal contraception long-term.  Obtaining ultrasound per below.  Scheduled for follow-up on 7/5 with access to care in case she needs Megace and on 7/15 with her new PCP to discuss further.  Pelvic pain Denies concern for STDs.  Pregnancy test negative.  Given chronicity, will go ahead and order ultrasound to rule out ovarian or uterine pathology.     Cleophas Dunker, [place]

## 2020-10-26 NOTE — Assessment & Plan Note (Signed)
Denies concern for STDs.  Pregnancy test negative.  Given chronicity, will go ahead and order ultrasound to rule out ovarian or uterine pathology.

## 2020-10-26 NOTE — Patient Instructions (Signed)
Thank you for coming to see me today. It was a pleasure. Today we talked about:   Your hemoglobin level is normal.  Pick up your pills and start them immediately.    We will get you scheduled for an ultrasound.    You can take mobic daily with daily.  Don't take ibuprofen with this.  Please follow-up with new PCP in 1 week.  If you have any questions or concerns, please do not hesitate to call the office at [phone].  Best,   [place] Constable, DO

## 2020-11-03 ENCOUNTER — Ambulatory Visit: Payer: Medicaid Other

## 2020-11-03 ENCOUNTER — Ambulatory Visit (HOSPITAL_COMMUNITY): Payer: Medicaid Other

## 2020-11-06 DIAGNOSIS — Z20822 Contact with and (suspected) exposure to covid-19: Secondary | ICD-10-CM | POA: Diagnosis not present

## 2020-11-12 NOTE — Progress Notes (Deleted)
    SUBJECTIVE:   CHIEF COMPLAINT / HPI:   Vaginal Bleeding Follow-up IUD (paragard) removed March 2022 Put on Loestrin Fe but only took 2 pills Also tried depo in the past (Dec 2022) Dr Sandi Carne recommended restarting Loestrin Fe, could consider Megace Ultrasound ordered due to pelvic pain ***  PERTINENT  PMH / PSH: ***  OBJECTIVE:   There were no vitals taken for this visit.  ***  ASSESSMENT/PLAN:   No problem-specific Assessment & Plan notes found for this encounter.     Alcus Dad, MD [place]

## 2020-11-13 ENCOUNTER — Ambulatory Visit: Payer: Medicaid Other | Admitting: Family Medicine

## 2020-11-26 ENCOUNTER — Other Ambulatory Visit: Payer: Self-pay

## 2020-11-26 ENCOUNTER — Emergency Department (HOSPITAL_BASED_OUTPATIENT_CLINIC_OR_DEPARTMENT_OTHER)
Admission: EM | Admit: 2020-11-26 | Discharge: 2020-11-26 | Disposition: A | Discharge status: Home / Self Care | Payer: Medicaid Other | Attending: Emergency Medicine | Admitting: Emergency Medicine

## 2020-11-26 ENCOUNTER — Encounter (HOSPITAL_BASED_OUTPATIENT_CLINIC_OR_DEPARTMENT_OTHER): Payer: Self-pay | Admitting: Emergency Medicine

## 2020-11-26 ENCOUNTER — Emergency Department (HOSPITAL_BASED_OUTPATIENT_CLINIC_OR_DEPARTMENT_OTHER): Payer: Medicaid Other

## 2020-11-26 DIAGNOSIS — S0990XA Unspecified injury of head, initial encounter: Secondary | ICD-10-CM | POA: Diagnosis not present

## 2020-11-26 DIAGNOSIS — R42 Dizziness and giddiness: Secondary | ICD-10-CM | POA: Diagnosis not present

## 2020-11-26 DIAGNOSIS — M542 Cervicalgia: Secondary | ICD-10-CM | POA: Diagnosis not present

## 2020-11-26 DIAGNOSIS — Z5321 Procedure and treatment not carried out due to patient leaving prior to being seen by health care provider: Secondary | ICD-10-CM | POA: Insufficient documentation

## 2020-11-26 DIAGNOSIS — Y92410 Unspecified street and highway as the place of occurrence of the external cause: Secondary | ICD-10-CM | POA: Insufficient documentation

## 2020-11-26 DIAGNOSIS — H538 Other visual disturbances: Secondary | ICD-10-CM | POA: Diagnosis not present

## 2020-11-26 DIAGNOSIS — R519 Headache, unspecified: Secondary | ICD-10-CM | POA: Diagnosis not present
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
## 2020-11-27 DIAGNOSIS — M25511 Pain in right shoulder: Secondary | ICD-10-CM | POA: Diagnosis not present

## 2020-11-27 DIAGNOSIS — S161XXA Strain of muscle, fascia and tendon at neck level, initial encounter: Secondary | ICD-10-CM | POA: Diagnosis not present

## 2020-11-27 DIAGNOSIS — M542 Cervicalgia: Secondary | ICD-10-CM | POA: Diagnosis not present

## 2020-11-27 DIAGNOSIS — S46911A Strain of unspecified muscle, fascia and tendon at shoulder and upper arm level, right arm, initial encounter: Secondary | ICD-10-CM | POA: Diagnosis not present

## 2021-02-28 ENCOUNTER — Other Ambulatory Visit: Payer: Self-pay | Admitting: Family Medicine

## 2021-03-08 ENCOUNTER — Ambulatory Visit: Payer: Medicaid Other | Admitting: Family Medicine

## 2021-03-12 ENCOUNTER — Ambulatory Visit: Payer: Medicaid Other

## 2021-03-16 ENCOUNTER — Ambulatory Visit: Payer: Medicaid Other

## 2021-03-19 ENCOUNTER — Ambulatory Visit: Payer: Medicaid Other

## 2021-03-31 ENCOUNTER — Other Ambulatory Visit: Payer: Self-pay

## 2021-03-31 ENCOUNTER — Ambulatory Visit: Payer: Medicaid Other | Admitting: Family Medicine

## 2021-03-31 DIAGNOSIS — F411 Generalized anxiety disorder: Secondary | ICD-10-CM | POA: Diagnosis not present

## 2021-03-31 DIAGNOSIS — F418 Other specified anxiety disorders: Secondary | ICD-10-CM

## 2021-03-31 MED ORDER — CLONAZEPAM 0.5 MG PO TABS: 0.5000 mg | ORAL_TABLET | Freq: Two times a day (BID) | ORAL | 0 refills | Status: DC | PRN

## 2021-03-31 NOTE — Progress Notes (Signed)
    SUBJECTIVE:   Patient presents for check up. She has a history of anxiety, medications include clonazepam as needed and citalopram daily. She has not been able to get a refill of her klonipin for the last 3 months since she was not able to be seen with her busy work and family schedule. She has been on this regimen for 3 years when she was experiencing postpartum anxiety which has significantly helped her anxiety, now she does better in social situations and enjoys being around people. It is been extra difficult lately as there have been 2 recent deaths in the family, has been going to funerals over the past month so she feels as if her anxiety is significantly heightened with the unfortunate combination of personal stressors and not having her medication. She sees her therapist at least once weekly. Also endorsing decreased energy level over the past month, denies another other symptoms including significant weight changes. Gets about 5 hours of sleep, works full-time in [employer] and is a mother. Support system includes husband, mother and spiritual advisor. When asked if she would be open to other medications, she shares that she was prescribed buspar but never took it since it makes her nervous to start taking a new medication as she often gets worried how her body will react.   OBJECTIVE:   BP 114/88   Pulse 83   Ht 4\' 11"  (1.499 m)   Wt 142 lb 9.6 oz (64.7 kg)   SpO2 100%   BMI 28.80 kg/m   General: Patient well-appearing, in no acute distress. HEENT: non-thyroid thyroid, no cervical LAD CV: RRR, no murmurs or gallops auscultated  Resp: CTAB Ext: radial pulses strong and equal bilaterally  Psych: mood appropriate, denies SI and HI  ASSESSMENT/PLAN:   Generalized anxiety disorder -GAD-7 score of 13 and PHQ-9 score of 7 with negative question 9 reviewed and discussed -med rec reviewed and changed appropriately  -continue current medication regimen, PDMP reviewed and  clonazepam refill provided, will continue to encourage buspar or alternative to benzo -coping techniques discussed -sleep hygiene discussed -follow up in 1 month, consider TSH and CBC at next visit if decreased energy level worsens      Drake Wuertz Larae Grooms, [place]

## 2021-03-31 NOTE — Assessment & Plan Note (Addendum)
-  GAD-7 score of 13 and PHQ-9 score of 7 with negative question 9 reviewed and discussed -med rec reviewed and changed appropriately  -continue current medication regimen, PDMP reviewed and clonazepam refill provided, will continue to encourage buspar or alternative to benzo -coping techniques discussed -sleep hygiene discussed -follow up in 1 month, consider TSH and CBC at next visit if decreased energy level worsens

## 2021-03-31 NOTE — Patient Instructions (Signed)
It was great seeing you today!  Today we discussed your mood, I am sorry for all that has occurred in the family recently. I will refill your clonazepam. Please utilize some of the techniques we discussed today so that you can get more sleep. I believe limited sleep and mood have affected your overall energy level. Once we try these things I think that will help. If not, please follow up in 1 month so we can do some blood work to check your thyroid and blood count.   Please follow up at your next scheduled appointment in 1 month, if anything arises between now and then, please don't hesitate to contact our office.   Thank you for allowing [place] to be a part of your medical care!  Thank you, Dr. Larae Grooms

## 2021-04-19 ENCOUNTER — Emergency Department (HOSPITAL_BASED_OUTPATIENT_CLINIC_OR_DEPARTMENT_OTHER)
Admission: EM | Admit: 2021-04-19 | Discharge: 2021-04-19 | Disposition: A | Discharge status: Home / Self Care | Payer: Medicaid Other | Attending: Emergency Medicine | Admitting: Emergency Medicine

## 2021-04-19 ENCOUNTER — Emergency Department (HOSPITAL_BASED_OUTPATIENT_CLINIC_OR_DEPARTMENT_OTHER): Payer: Medicaid Other

## 2021-04-19 ENCOUNTER — Encounter (HOSPITAL_BASED_OUTPATIENT_CLINIC_OR_DEPARTMENT_OTHER): Payer: Self-pay

## 2021-04-19 ENCOUNTER — Other Ambulatory Visit: Payer: Self-pay

## 2021-04-19 DIAGNOSIS — D259 Leiomyoma of uterus, unspecified: Secondary | ICD-10-CM | POA: Insufficient documentation

## 2021-04-19 DIAGNOSIS — D219 Benign neoplasm of connective and other soft tissue, unspecified: Secondary | ICD-10-CM

## 2021-04-19 DIAGNOSIS — R1032 Left lower quadrant pain: Secondary | ICD-10-CM

## 2021-04-19 DIAGNOSIS — Z7951 Long term (current) use of inhaled steroids: Secondary | ICD-10-CM | POA: Diagnosis not present

## 2021-04-19 DIAGNOSIS — R102 Pelvic and perineal pain: Secondary | ICD-10-CM | POA: Diagnosis not present

## 2021-04-19 DIAGNOSIS — J45909 Unspecified asthma, uncomplicated: Secondary | ICD-10-CM | POA: Diagnosis not present

## 2021-04-19 DIAGNOSIS — R1031 Right lower quadrant pain: Secondary | ICD-10-CM | POA: Diagnosis not present

## 2021-04-19 LAB — COMPREHENSIVE METABOLIC PANEL
ALT: 9 U/L (ref 0–44)
AST: 14 U/L — ABNORMAL LOW (ref 15–41)
Albumin: 4.8 g/dL (ref 3.5–5.0)
Alkaline Phosphatase: 41 U/L (ref 38–126)
Anion gap: 8 (ref 5–15)
BUN: 10 mg/dL (ref 6–20)
CO2: 26 mmol/L (ref 22–32)
Calcium: 9.2 mg/dL (ref 8.9–10.3)
Chloride: 105 mmol/L (ref 98–111)
Creatinine, Ser: 0.64 mg/dL (ref 0.44–1.00)
GFR, Estimated: >60 mL/min (ref >60)
Glucose, Bld: 84 mg/dL (ref 70–99)
Potassium: 3.3 mmol/L — ABNORMAL LOW (ref 3.5–5.1)
Sodium: 139 mmol/L (ref 135–145)
Total Bilirubin: 0.6 mg/dL (ref 0.3–1.2)
Total Protein: 7.7 g/dL (ref 6.5–8.1)

## 2021-04-19 LAB — URINALYSIS, ROUTINE W REFLEX MICROSCOPIC
Bilirubin Urine: NEGATIVE
Glucose, UA: NEGATIVE mg/dL
Ketones, ur: NEGATIVE mg/dL
Nitrite: NEGATIVE
Protein, ur: 30 mg/dL — AB
RBC / HPF: >50 RBC/hpf — ABNORMAL HIGH (ref 0–5)
Specific Gravity, Urine: 1.032 — ABNORMAL HIGH (ref 1.005–1.030)
pH: 6 (ref 5.0–8.0)

## 2021-04-19 LAB — CBC WITH DIFFERENTIAL/PLATELET
Abs Immature Granulocytes: 0.01 10*3/uL (ref 0.00–0.07)
Basophils Absolute: 0 10*3/uL (ref 0.0–0.1)
Basophils Relative: 1 %
Eosinophils Absolute: 0.2 10*3/uL (ref 0.0–0.5)
Eosinophils Relative: 3 %
HCT: 37.2 % (ref 36.0–46.0)
Hemoglobin: 12.6 g/dL (ref 12.0–15.0)
Immature Granulocytes: 0 %
Lymphocytes Relative: 23 %
Lymphs Abs: 1.3 10*3/uL (ref 0.7–4.0)
MCH: 27.9 pg (ref 26.0–34.0)
MCHC: 33.9 g/dL (ref 30.0–36.0)
MCV: 82.3 fL (ref 80.0–100.0)
Monocytes Absolute: 0.4 10*3/uL (ref 0.1–1.0)
Monocytes Relative: 7 %
Neutro Abs: 3.6 10*3/uL (ref 1.7–7.7)
Neutrophils Relative %: 66 %
Platelets: 343 10*3/uL (ref 150–400)
RBC: 4.52 MIL/uL (ref 3.87–5.11)
RDW: 13.9 % (ref 11.5–15.5)
WBC: 5.4 10*3/uL (ref 4.0–10.5)
nRBC: 0 % (ref 0.0–0.2)

## 2021-04-19 LAB — WET PREP, GENITAL
Clue Cells Wet Prep HPF POC: NONE SEEN
Sperm: NONE SEEN
Trich, Wet Prep: NONE SEEN
WBC, Wet Prep HPF POC: <10 (ref <10)
Yeast Wet Prep HPF POC: NONE SEEN

## 2021-04-19 LAB — LIPASE, BLOOD: Lipase: <10 U/L — ABNORMAL LOW (ref 11–51)

## 2021-04-19 LAB — PREGNANCY, URINE: Preg Test, Ur: NEGATIVE

## 2021-04-19 MED ORDER — KETOROLAC TROMETHAMINE 15 MG/ML IJ SOLN
15.0000 mg | Freq: Once | INTRAMUSCULAR | Status: AC
  Administered 2021-04-19: 18:00:00 15 mg via INTRAVENOUS
  Filled 2021-04-19: qty 1

## 2021-04-19 MED ORDER — NAPROXEN 500 MG PO TABS: 500.0000 mg | ORAL_TABLET | Freq: Two times a day (BID) | ORAL | 0 refills | Status: AC

## 2021-04-19 NOTE — ED Triage Notes (Signed)
Patient here POV from Home with Pelvic Pain and Lower Back Pain.  Patient states Pain began approximately 2 weeks PTA and Pain has worsened since. No Fevers. Painful to urinate.  NAD Noted during Triage. A&Ox4. GCS 15. Ambulatory. Moderate Nausea.

## 2021-04-19 NOTE — Discharge Instructions (Signed)
You may alternate taking Tylenol and Naproxen as needed for pain control. You may take Naproxen twice daily as directed on your discharge paperwork and you may take  [phone] mg of Tylenol every 6 hours. Do not exceed 4000 mg of Tylenol daily as this can lead to liver damage. Also, make sure to take Naproxen with meals as it can cause an upset stomach. Do not take other NSAIDs while taking Naproxen such as (Aleve, Ibuprofen, Aspirin, Celebrex, etc) and do not take more than the prescribed dose as this can lead to ulcers and bleeding in your GI tract. You may use warm and cold compresses to help with your symptoms.   Please follow up with your primary doctor or the [facility] within the next 7-10 days for re-evaluation and further treatment of your symptoms.   Please return to the ER sooner if you have any new or worsening symptoms.

## 2021-04-19 NOTE — ED Provider Notes (Signed)
[facility] EMERGENCY DEPT Provider Note   CSN: [id number] Arrival date & time: 04/19/21  1504     History Chief Complaint  Patient presents with   Pelvic Pain    Wanda Nixon is a 30 y.o. female.  HPI   Pt is a 30 y/o female with a h/o anemia, asthma, depression, eczema, headache, who presents to the ED today for eval of left pelvic pain and lower back pain that started about 2 weeks ago. She reports hx of an ovarian cyst on the let side. States pain is worse with urination, certain movements and certain positions. She reports associated nausea, vomiting (from pain), urinary frequency, urgency, vaginal discharge. Denies current diarrhea, constipation. Pt denies any numbness/tingling/weakness to the BLE. Denies saddle anesthesia. Denies loss of control of bowels or bladder. No fevers. Denies a h/o IVDU. Denies a h/o CA.  Pt is currently on her menstrual cycle. Pt states she is currently sexually active with one partner (she is married.)  Past Medical History:  Diagnosis Date   Anemia    Asthma    Last used 08/30/14; weekly inhaler use, used inhaler 2 weeks ago   Cholestasis of pregnancy in third trimester 07/25/2015   Depression    celexa off for several months   Eczema    Headache(784.0)    migraines   NST (non-stress test) nonreactive    SVD (spontaneous vaginal delivery) 07/26/2015    Patient Active Problem List   Diagnosis Date Noted   Vaginal bleeding 07/19/2020   Sensation of feeling hot 12/30/2019   Allergies 09/20/2019   Contraception management 09/20/2019   Generalized anxiety disorder 06/21/2018   Eczema 01/24/2018   Pelvic pain 09/12/2017   Asthma 02/26/2016   Situational anxiety 02/12/2016   Migraine 10/25/2013    Past Surgical History:  Procedure Laterality Date   MOUTH SURGERY     TYMPANOSTOMY TUBE PLACEMENT     WISDOM TOOTH EXTRACTION       OB History     Gravida  6   Para  3   Term  3   Preterm  0   AB  1   Living   3      SAB  1   IAB      Ectopic      Multiple  0   Live Births  3           Family History  Problem Relation Age of Onset   Diabetes Maternal Grandmother    Hypertension Maternal Grandmother    Stroke Maternal Grandmother    Hypertension Mother    Hyperlipidemia Mother    Hyperlipidemia Father     Social History   Tobacco Use   Smoking status: Never   Smokeless tobacco: Never  Vaping Use   Vaping Use: Never used  Substance Use Topics   Alcohol use: Not Currently    Comment: Occasional   Drug use: No    Home Medications Prior to Admission medications   Medication Sig Start Date End Date Taking? Authorizing Provider  naproxen (NAPROSYN) 500 MG tablet Take 1 tablet (500 mg total) by mouth 2 (two) times daily for 7 days. 04/19/21 04/26/21 Yes Steffen Hase S, PA-C  busPIRone (BUSPAR) 10 MG tablet TAKE 1 TABLET BY MOUTH THREE TIMES A DAY 09/15/20   Meccariello, Bernita Raisin, DO  cetirizine (ZYRTEC) 10 MG tablet Take 1 tablet (10 mg total) by mouth daily. 09/11/19 09/10/20  Shirley, [place], DO  citalopram (CELEXA) 40 MG tablet  TAKE 1 TABLET BY MOUTH EVERY DAY 09/15/20   Meccariello, Bernita Raisin, DO  clonazePAM (KLONOPIN) 0.5 MG tablet Take 1 tablet (0.5 mg total) by mouth 2 (two) times daily as needed for anxiety. 03/31/21   Ganta, Anupa, DO  fluticasone (FLONASE) 50 MCG/ACT nasal spray Place 1 spray into both nostrils daily. 1 spray in each nostril every day Patient not taking: Reported on 03/31/2021 09/11/19   Shirley, [place], DO  fluticasone (FLOVENT HFA) 110 MCG/ACT inhaler Inhale 2 puffs into the lungs 2 (two) times daily. Patient not taking: Reported on 03/31/2021 09/16/19   Shirley, [place], DO  meloxicam (MOBIC) 15 MG tablet Take 1 tablet (15 mg total) by mouth daily. Patient not taking: Reported on 03/31/2021 10/26/20   Meccariello, Bernita Raisin, DO  norethindrone-ethinyl estradiol (LOESTRIN FE 1/20) 1-20 MG-MCG tablet Take 1 tablet by mouth daily. Patient not taking:  Reported on 03/31/2021 07/17/20   Simmons-Robinson, Riki Sheer, MD  Prenatal Vit-Fe Fumarate-FA (PRENATAL VITAMIN) 27-0.8 MG TABS Take 1 tablet by mouth daily. Patient not taking: Reported on 03/31/2021 08/03/18   Myles Gip, DO  PROAIR HFA 108 (90 Base) MCG/ACT inhaler TAKE 2 PUFFS BY MOUTH EVERY 6 HOURS AS NEEDED FOR WHEEZE OR SHORTNESS OF BREATH Patient not taking: Reported on 03/31/2021 03/02/21   Alcus Dad, MD  propranolol (INDERAL) 10 MG tablet Take as needed 30 minutes before speaking events. Patient not taking: Reported on 03/31/2021 10/24/19   Meccariello, Bernita Raisin, DO  triamcinolone (KENALOG) 0.1 % Apply 1 application topically 2 (two) times daily. Patient not taking: Reported on 03/31/2021 06/23/20   Meccariello, Bernita Raisin, DO    Allergies    Tomato, Mushroom extract complex, and Reglan [metoclopramide]  Review of Systems   Review of Systems  Constitutional:  Negative for chills and fever.  HENT:  Negative for ear pain and sore throat.   Eyes:  Negative for pain and visual disturbance.  Respiratory:  Negative for cough and shortness of breath.   Cardiovascular:  Negative for chest pain.  Gastrointestinal:  Positive for abdominal pain, nausea and vomiting. Negative for constipation and diarrhea.  Genitourinary:  Positive for frequency, pelvic pain and urgency. Negative for dysuria and hematuria.  Musculoskeletal:  Positive for back pain.  Skin:  Negative for rash.  Neurological:  Negative for syncope, weakness and numbness.  All other systems reviewed and are negative.  Physical Exam Updated Vital Signs BP 112/87    Pulse 71    Temp 97.8 F (36.6 C)    Resp 17    Ht 4\' 11"  (1.499 m)    Wt 64.7 kg    LMP 04/18/2021    SpO2 100%    BMI 28.81 kg/m   Physical Exam Vitals and nursing note reviewed.  Constitutional:      General: She is not in acute distress.    Appearance: She is well-developed.  HENT:     Head: Normocephalic and atraumatic.  Eyes:      Conjunctiva/sclera: Conjunctivae normal.  Cardiovascular:     Rate and Rhythm: Normal rate and regular rhythm.     Heart sounds: No murmur heard. Pulmonary:     Effort: Pulmonary effort is normal. No respiratory distress.     Breath sounds: Normal breath sounds.  Abdominal:     General: Bowel sounds are normal.     Palpations: Abdomen is soft.     Tenderness: There is no abdominal tenderness. There is no guarding or rebound.  Genitourinary:    Comments: Exam performed  by Rodney Booze,  exam chaperoned Date: 04/19/2021 Pelvic exam: normal external genitalia without evidence of trauma. VULVA: normal appearing vulva with no masses, tenderness or lesion. VAGINA: normal appearing vagina with normal color and discharge, no lesions. CERVIX: normal appearing cervix without lesions, cervical motion tenderness absent, cervical os closed with out purulent discharge; vaginal discharge - clear, Wet prep and DNA probe for chlamydia and GC obtained.   ADNEXA: normal adnexa in size, and no masses, left adnexal ttp UTERUS: uterus is normal size, shape, consistency and is ttp.   Musculoskeletal:        General: No swelling.     Cervical back: Neck supple.  Skin:    General: Skin is warm and dry.     Capillary Refill: Capillary refill takes less than 2 seconds.  Neurological:     Mental Status: She is alert.  Psychiatric:        Mood and Affect: Mood normal.    ED Results / Procedures / Treatments   Labs (all labs ordered are listed, but only abnormal results are displayed) Labs Reviewed  URINALYSIS, ROUTINE W REFLEX MICROSCOPIC - Abnormal; Notable for the following components:      Result Value   APPearance HAZY (*)    Specific Gravity, Urine 1.032 (*)    Hgb urine dipstick LARGE (*)    Protein, ur 30 (*)    Leukocytes,Ua TRACE (*)    RBC / HPF >50 (*)    All other components within normal limits  COMPREHENSIVE METABOLIC PANEL - Abnormal; Notable for the following components:    Potassium 3.3 (*)    AST 14 (*)    All other components within normal limits  LIPASE, BLOOD - Abnormal; Notable for the following components:   Lipase <10 (*)    All other components within normal limits  WET PREP, GENITAL  PREGNANCY, URINE  CBC WITH DIFFERENTIAL/PLATELET  GC/CHLAMYDIA PROBE AMP ([place]) NOT AT [facility]    EKG None  Radiology US PELVIC COMPLETE W TRANSVAGINAL AND TORSION R/O  Result Date: 04/19/2021 CLINICAL DATA:  30 year old female with 2-3 weeks of left lower quadrant pain. LMP 04/18/2021. EXAM: TRANSABDOMINAL AND TRANSVAGINAL ULTRASOUND OF PELVIS DOPPLER ULTRASOUND OF OVARIES TECHNIQUE: Both transabdominal and transvaginal ultrasound examinations of the pelvis were performed. Transabdominal technique was performed for global imaging of the pelvis including uterus, ovaries, adnexal regions, and pelvic cul-de-sac. It was necessary to proceed with endovaginal exam following the transabdominal exam to visualize the endometrium and adnexa. Color and duplex Doppler ultrasound was utilized to evaluate blood flow to the ovaries. COMPARISON:  08/20/2019 obstetric scan.  09/12/2017 pelvic sonogram. FINDINGS: Uterus Measurements: 8.7 x 4.5 x 4.9 cm = volume: 100 mL. Anteverted uterus is normal in size and configuration. Small intramural 1.1 x 1.2 x 1.3 cm upper right uterine body fibroid. Endometrium Thickness: 7 mm. No endometrial cavity fluid or focal endometrial mass. Right ovary Measurements: 3.3 x 2.3 x 1.9 cm = volume: 7.5 mL. Normal appearance/no adnexal mass. Left ovary Measurements: 3.4 x 1.8 x 3.0 cm = volume: 9.7 mL. Normal appearance/no adnexal mass. Pulsed Doppler evaluation of both ovaries demonstrates normal low-resistance arterial and venous waveforms. Other findings No abnormal free fluid. IMPRESSION: 1. Normal ovaries.  No evidence of adnexal torsion. 2. Small intramural upper right uterine body fibroid. Electronically Signed   By: Ilona Sorrel M.D.   On: 04/19/2021  19:56    Procedures Procedures   Medications Ordered in ED Medications  ketorolac (TORADOL) 15 MG/ML  injection 15 mg (15 mg Intravenous Given 04/19/21 1740)    ED Course  I have reviewed the triage vital signs and the nursing notes.  Pertinent labs & imaging results that were available during my care of the patient were reviewed by me and considered in my medical decision making (see chart for details).    MDM Rules/Calculators/A&P                          30 year old female presenting for evaluation of pelvic pain and lower back pain.  This is been ongoing for several weeks.  She is currently on her menstrual cycle.  CBC is without leukocytosis or anemia.  CMP without electrolyte abnormalities.  Normal kidney and liver function.  Lipase negative, prior test negative, UA with mild leukocytes but no nitrites or bacteria to suggest urinary tract infections I have lower suspicion for this.  She did have some hematuria but is on her menstrual cycle.  Her pelvic exam had some mild uterine tenderness and mild left adnexal tenderness.  Pelvic ultrasound was completed and showed no evidence of torsion but did show evidence of uterine fibroids.  I suspect that this is the etiology of patient's symptoms today.  She received Toradol and had significant improvement of her symptoms on reassessment.  Will prescribe course of NSAIDs for home advised she follow-up with OB/GYN.  Advised her on specific return precautions.  She voices understanding the plan and reasons to return.  All questions answered.  Patient stable for discharge.    Final Clinical Impression(s) / ED Diagnoses Final diagnoses:  Pelvic pain  Fibroid    Rx / DC Orders ED Discharge Orders          Ordered    naproxen (NAPROSYN) 500 MG tablet  2 times daily        04/19/21 2009             [place], Willia Craze, [place] 04/19/21 2010    Wynona Dove A, DO 04/21/21 0024

## 2021-04-20 LAB — GC/CHLAMYDIA PROBE AMP (~~LOC~~) NOT AT ARMC
Chlamydia: NEGATIVE
Comment: NEGATIVE
Comment: NORMAL
Neisseria Gonorrhea: NEGATIVE

## 2021-04-29 ENCOUNTER — Telehealth: Payer: Self-pay

## 2021-04-29 NOTE — Telephone Encounter (Signed)
Patient LVM on nurse line reporting continued abdominal pain. Patient reports she was told to take naproxen, however "naproxen is not touching it." Patient has an apt scheduled for 1/6 with PCP. However, patient is requesting something stronger in the meantime.   Attempted to call patient back to get more information, however I had to LVM.   Will forward to PCP in the meantime.

## 2021-05-01 NOTE — Telephone Encounter (Signed)
°  Would not be appropriate to prescribe additional pain medication without evaluating patient first. If she has severe pain that is not responding to Naproxen and cannot wait until January 6th, patient should consider evaluation at Urgent Care or ED.  Otherwise, will discuss additional options at our visit on January 6th.  Alcus Dad, MD

## 2021-05-04 ENCOUNTER — Telehealth: Payer: Self-pay

## 2021-05-04 DIAGNOSIS — F418 Other specified anxiety disorders: Secondary | ICD-10-CM

## 2021-05-04 DIAGNOSIS — F411 Generalized anxiety disorder: Secondary | ICD-10-CM

## 2021-05-04 MED ORDER — CLONAZEPAM 0.5 MG PO TABS: 0.5000 mg | ORAL_TABLET | Freq: Two times a day (BID) | ORAL | 0 refills | Status: DC | PRN

## 2021-05-04 NOTE — Telephone Encounter (Signed)
°  Reviewed patient chart thoroughly. Confirmed that she has been on clonazepam in the past (0.5mg  twice daily prn) for significant anxiety and family deaths.  Also reviewed [place] PDMP, which is appropriate. Has had limited supply (5-10 days worth) of clonazepam on various occasions in the past, but does not take this chronically. No red flags.  Will send 4 tablets of Clonazepam to patient's pharmacy to use for the next 2 days. Can discuss further at her appointment on January 6th.  Alcus Dad, MD PGY-2 [place]

## 2021-05-04 NOTE — Telephone Encounter (Signed)
Patient calls nurse line reporting a death in her family. Patient reports she found out yesterday that her aunt passed away. Patient reports she was given clonazepam in November for close deaths as well. Patient reports she has not been sleeping and has been having a hard time coping the last 24 hours. Patient reports she does have an apt on 1/6 with PCP in the morning. However, patient reports she will "go crazy" by then. Patient denies harm to herself or others. Will forward to PCP for advisement.

## 2021-05-06 ENCOUNTER — Ambulatory Visit: Payer: Medicaid Other | Admitting: Family Medicine

## 2021-05-07 ENCOUNTER — Encounter: Payer: Self-pay | Admitting: Family Medicine

## 2021-05-07 ENCOUNTER — Other Ambulatory Visit: Payer: Self-pay

## 2021-05-07 ENCOUNTER — Ambulatory Visit (INDEPENDENT_AMBULATORY_CARE_PROVIDER_SITE_OTHER): Payer: Medicaid Other | Admitting: Family Medicine

## 2021-05-07 VITALS — BP 122/83 | HR 91 | Ht 59.0 in | Wt 138.0 lb

## 2021-05-07 DIAGNOSIS — Z3040 Encounter for surveillance of contraceptives, unspecified: Secondary | ICD-10-CM | POA: Diagnosis not present

## 2021-05-07 DIAGNOSIS — Z113 Encounter for screening for infections with a predominantly sexual mode of transmission: Secondary | ICD-10-CM | POA: Diagnosis not present

## 2021-05-07 DIAGNOSIS — R4589 Other symptoms and signs involving emotional state: Secondary | ICD-10-CM

## 2021-05-07 DIAGNOSIS — Z124 Encounter for screening for malignant neoplasm of cervix: Secondary | ICD-10-CM | POA: Diagnosis not present

## 2021-05-07 DIAGNOSIS — F411 Generalized anxiety disorder: Secondary | ICD-10-CM | POA: Diagnosis not present

## 2021-05-07 DIAGNOSIS — F418 Other specified anxiety disorders: Secondary | ICD-10-CM | POA: Diagnosis not present

## 2021-05-07 DIAGNOSIS — Z0000 Encounter for general adult medical examination without abnormal findings: Secondary | ICD-10-CM | POA: Diagnosis not present

## 2021-05-07 DIAGNOSIS — D251 Intramural leiomyoma of uterus: Secondary | ICD-10-CM

## 2021-05-07 DIAGNOSIS — Z302 Encounter for sterilization: Secondary | ICD-10-CM | POA: Diagnosis not present

## 2021-05-07 LAB — HM PAP SMEAR: HM Pap smear: NORMAL

## 2021-05-07 MED ORDER — CLONAZEPAM 0.5 MG PO TABS: 0.5000 mg | ORAL_TABLET | Freq: Two times a day (BID) | ORAL | 0 refills | Status: DC | PRN

## 2021-05-07 NOTE — Progress Notes (Signed)
° ° °  SUBJECTIVE:   CHIEF COMPLAINT / HPI:   Anxiety  -Patient with generalized anxiety since 2016.  -Has been on Citalopram 40mg  daily for the past 3 years. Has done fairly well but still with significant anxiety symptoms.  -Reports constant racing of thoughts, feeling anxious, trouble relaxing and sleeping, often avoids doing/trying things due to anxiety.  -She notes several life stressors that have contributed to her anxiety, including multiple miscarriages, husband diagnosed with cancer (fortunately is doing better now) and multiple deaths in her family recently. -Recently started seeing a therapist. Is going once weekly. Finds this helpful so far. -Has been prescribed Clonazepam on few rare occasions in the past due to extreme circumstances (mainly deaths of family members). Uses this very sparingly. -Has been prescribed Buspar in the past but has never tried it (afraid to try it) -Also prescribed propranolol prn for speaking engagements but has never tried it (anxious about trying new meds) -Lately endorses some depressed mood as well-- feeling "mopey", tearful, over past 3 weeks. Denies SI.  Fibroid Patient seen in the ED 3 weeks ago for pelvic pain. Had ultrasound showing small intramural fibroid at that time. Patient reports pain has been ongoing and not well controlled with OTC meds. She has an appointment later today with OBGYN to discuss next steps.    OBJECTIVE:   BP 122/83    Pulse 91    Ht 4\' 11"  (1.499 m)    Wt 138 lb (62.6 kg)    LMP 04/18/2021    SpO2 100%    BMI 27.87 kg/m   General: NAD, pleasant, able to participate in exam Respiratory: No respiratory distress Skin: warm and dry, no rashes noted Psych: Normal affect and mood Neuro: grossly intact   ASSESSMENT/PLAN:   Generalized anxiety disorder Established problem. Worse recently given deaths in the family. GAD-7 significantly elevated to 19 today with PHQ-9 score of 17 (negative to question #9). -Continue  Citalopram 40mg  daily -Patient agreeable to start Buspar 10mg . She will trial just prn at first but advised she can increase up to TID -Continue weekly counseling/therapy -Refill sent for 8 tablets of clonazepam 0.5mg , to be used prn only in extreme circumstances. PDMP reviewed and appropriate. -Crisis resources provided in AVS -Follow up in 1 month   Intramural uterine fibroid Patient will have OBGYN fax records to our office after her visit today    Alcus Dad, MD [place]

## 2021-05-07 NOTE — Patient Instructions (Addendum)
It was great to meet you!  For your anxiety: -Continue your Citalopram (celexa) 40mg  daily -Continue seeing your therapist weekly -Start taking Buspar. Take 1 tablet at a time. You can use this up to 3x daily (every 8 hours). -Avoid using the Klonopin except in very rare, crisis-type situations -Follow up in 1 month to check in  For your fibroid: -I'm glad you're seeing the OBGYN today. Please have them fax the records to our office at [phone].   Take care and seek immediate care sooner if you develop any concerns.  Dr. [facility]    If you are feeling suicidal or depression symptoms worsen please immediately go to:   If you are thinking about harming yourself or having thoughts of suicide, or if you know someone who is, seek help right away. If you are in crisis, make sure you are not left alone.  If someone else is in crisis, make sure he/she/they is not left alone  Call 988 OR [phone]-TALK  24 Hour Availability for [place]  [address] [place], [place] [phone] Crisis [phone]

## 2021-05-08 DIAGNOSIS — D251 Intramural leiomyoma of uterus: Secondary | ICD-10-CM | POA: Insufficient documentation

## 2021-05-08 NOTE — Assessment & Plan Note (Signed)
Patient will have OBGYN fax records to our office after her visit today

## 2021-05-08 NOTE — Assessment & Plan Note (Addendum)
Established problem. Worse recently given deaths in the family. GAD-7 significantly elevated to 19 today with PHQ-9 score of 17 (negative to question #9). -Continue Citalopram 40mg  daily -Patient agreeable to start Buspar 10mg . She will trial just prn at first but advised she can increase up to TID -Continue weekly counseling/therapy -Refill sent for 8 tablets of clonazepam 0.5mg , to be used prn only in extreme circumstances. PDMP reviewed and appropriate. -Crisis resources provided in AVS -Follow up in 1 month

## 2021-06-07 ENCOUNTER — Ambulatory Visit: Payer: Medicaid Other | Admitting: Family Medicine

## 2021-06-07 NOTE — Progress Notes (Deleted)
    SUBJECTIVE:   CHIEF COMPLAINT / HPI:   ***  PERTINENT  PMH / PSH: ***  OBJECTIVE:   There were no vitals taken for this visit.   General: Alert, no acute distress Cardio: Normal S1 and S2, RRR, no r/m/g Pulm: CTAB, normal work of breathing Abdomen: Bowel sounds normal. Abdomen soft and non-tender.  Extremities: No peripheral edema.  Neuro: Cranial nerves grossly intact   ASSESSMENT/PLAN:   No problem-specific Assessment & Plan notes found for this encounter.     Kentrel Clevenger, MD [facility]   

## 2021-06-07 NOTE — Patient Instructions (Incomplete)
Thank you for coming to see me today. It was a pleasure.   Continue Citalopram Continue Buspar 10 mg three times a day  Use Clonazepam sparingly, you will need to talk to your PCP for refills of this medication.  Please follow-up with PCP in 2 weeks  If you have any questions or concerns, please do not hesitate to call the office at [phone].  Best,   Carollee Leitz, MD    Psychiatry Resource List (Adults and Children) Most of these providers will take Medicaid. please consult your insurance for a complete and updated list of available providers. When calling to make an appointment have your insurance information available to confirm you are covered.  [place]:   [place]: [address]     [phone] [place]: [place],        [phone] [place]: [address] [place],    [place]: [address] [place],                   [place]  (Psychiatry & counseling ; adults & children ; will take Medicaid) [place], [place], [place]        [phone]   [place]  (Psychiatry only; Adults only, will take Medicaid)  [place], [place], [place] [address]       [phone]   [place] (Psychiatry & counseling ; adults & children ; will take Medicaid [place] [address]  [place] [address]   [phone]    ([place] therapist)  [facility]  Psychiatry & counseling; Adults and children;  [address]    [place], [place] [address]    [phone]  [facility] (Psychiatry & counseling; adults & children; Medicare no Medicaid)  [place], [place]  [address]      [phone]    [facility] (up to age 57)  Psychiatry & counseling ,will take Medicaid, must do counseling to receive psychiatry services  [address] [place] [address]         ([place] (Psychiatry & counseling; adults & children; will take Medicaid) [address],  [place], [place]  [phone]  [facility]---  Walk-in Mon-Fri, 8:30-5:00 (will take Medicaid)  [address], [place], [place]  [phone]    [facility] --- Walk-In Mon-Friday 8am-3pm ( will take Medicaid, Psychiatry, Adults & children,  [address] [place], [place]   [phone]   [facility]--, Walk-in M-F 8am-12pm and 1pm -3pm   (Counseling, Psychiatry, will take Medicaid, adults & children)  [address], [place], [place]  [phone]

## 2021-06-14 DIAGNOSIS — Z20822 Contact with and (suspected) exposure to covid-19: Secondary | ICD-10-CM | POA: Diagnosis not present

## 2021-06-14 DIAGNOSIS — R0981 Nasal congestion: Secondary | ICD-10-CM | POA: Diagnosis not present

## 2021-06-14 DIAGNOSIS — R059 Cough, unspecified: Secondary | ICD-10-CM | POA: Diagnosis not present

## 2021-06-14 DIAGNOSIS — R0982 Postnasal drip: Secondary | ICD-10-CM | POA: Diagnosis not present

## 2021-06-14 DIAGNOSIS — B349 Viral infection, unspecified: Secondary | ICD-10-CM | POA: Diagnosis not present

## 2021-07-04 DIAGNOSIS — G8929 Other chronic pain: Secondary | ICD-10-CM | POA: Insufficient documentation

## 2021-07-06 ENCOUNTER — Other Ambulatory Visit: Payer: Self-pay | Admitting: Obstetrics and Gynecology

## 2021-07-08 ENCOUNTER — Telehealth: Payer: Self-pay

## 2021-07-08 NOTE — Telephone Encounter (Signed)
Patient calls nurse line requesting refill on Triamcinolone cream. Requesting refill [facility] on [address].  ? ?This is not on patient's current medication list.  ? ?Please advise.  ? ?Talbot Grumbling, RN ? ?

## 2021-07-10 DIAGNOSIS — K047 Periapical abscess without sinus: Secondary | ICD-10-CM | POA: Diagnosis not present

## 2021-07-12 NOTE — Telephone Encounter (Signed)
Tried to contact pt to inform her of below and could not LVM due to it being full.  If she calls back please inform her of below and I will also try to send a [employer] Dohmen Zimmerman Rumple, CMA ? ? ? ?Ganta, Anupa, DO  [facility] 4 days ago  ? ?This med is not on patient's med list. Patient needs to be seen by any provider to ensure that we are treating the appropriate condition and prescribing the right medication. Thanks   ? ?

## 2021-07-13 ENCOUNTER — Ambulatory Visit: Payer: Medicaid Other | Admitting: Student

## 2021-07-13 ENCOUNTER — Telehealth: Payer: Self-pay

## 2021-07-13 ENCOUNTER — Encounter: Payer: Self-pay | Admitting: Student

## 2021-07-13 ENCOUNTER — Other Ambulatory Visit: Payer: Self-pay

## 2021-07-13 VITALS — BP 131/96 | HR 88 | Wt 184.0 lb

## 2021-07-13 DIAGNOSIS — K047 Periapical abscess without sinus: Secondary | ICD-10-CM | POA: Diagnosis not present

## 2021-07-13 NOTE — Telephone Encounter (Signed)
Patient calls nurse line regarding concerns with dental abscess. Patient reports being treated for abscess on 07/10/21. Patient reports that abscessed area is improving and she is no longer having pain in her mouth. However, she is now experiencing body aches and extreme fatigue.  ? ?Patient is requesting to schedule follow up appointment for further evaluation.  ? ?Scheduled patient for this afternoon with Dr. Adah Salvage at 2:25. ? ?Talbot Grumbling, RN ? ?

## 2021-07-13 NOTE — Patient Instructions (Signed)
It was wonderful to meet you today. Thank you for allowing me to be a part of your care. Below is a short summary of what we discussed at your visit today: ? ?Your symptoms are consistent with ongoing infection. ? ?I recommend you continue the Augmentin and finished a 10-day course.  Afterwards you can start on the stool and if no improvement. ? ?It is important that you follow-up with your dentist to manage the root cause of the dental abscess. ? ?As needed if no improvement in a week. ? ? ?If you have any questions or concerns, please do not hesitate to contact us via phone or MyChart message.  ? ?Alen Bleacher, MD ?[facility]  ?

## 2021-07-13 NOTE — Progress Notes (Signed)
? ? ?  SUBJECTIVE:  ? ?CHIEF COMPLAINT / HPI: Muscle ache and fatigue  ? ?Patient reports that she has been extremely exhausted and having intermittent muscle ache since the weekend.  She says she was recently started on Augmentin for dental abscess.  Prior to that patient report having fever, facial swelling, dental pain, muscle ache and lightheadedness.  Was seen at urgent care who prescribed Augmentin for 10 days. Patient said he has been afebrile and dental pain has since improved since starting Augmentin however she has had worsening fatigue, muscle ache and loose stool.  She denies any known sick contacts.  She has since seen a dentist who recommend switching to amoxicillin with plans to see an orthodontist for root canal.  ? ?PERTINENT  PMH / PSH: Fibroid, GAD,  Migraines ? ?OBJECTIVE:  ? ?BP (!) 131/96   Pulse 88   Wt 83.5 kg   SpO2 100%   BMI 37.16 kg/m?   ? ?General: Alert, well appearing, NAD ?Mouth: Dental implant, Edema of the anterior maxilla ?CV: RRR, no murmurs, normal S1/S2 ?Pulm: CTAB, good WOB on RA, no crackles or wheezing ?Abd: Soft, no distension, no tenderness ?Skin: dry, warm ?Ext: No BLE edema, +2 Pedal and radial pulse. ? ? ?ASSESSMENT/PLAN:  ?Dental abscess ?Patient presented with complaint of fatigue and muscle ache.  On exam she is well appearing with reassuring cardiac and pulmonary exam, however found to have anterior inferior maxillary edema consistent with her recent diagnosis of dental abscess.  She is currently being treated with Augmentin and on day 4 of treatment.  Although she complains of worsening fatigue and muscle ache patient has shown improvement on current treatment.  Denies any dental pain and has remained afebrile.  Have fatigue and muscle ache likely in the setting of ongoing infectious process with a dental abscess. ?-Given improvement on current treatment regimen, recommended patient finishes Augmentin course. ?-Advised patient to follow-up with orthodontist  appointment for root canal and other dental management ?-Follow-up if no improvement in a week for reassessments  ? ? ? ? ? ?Alen Bleacher, MD ?[place]  ? ? ?

## 2021-07-14 ENCOUNTER — Other Ambulatory Visit: Payer: Self-pay

## 2021-07-14 DIAGNOSIS — F411 Generalized anxiety disorder: Secondary | ICD-10-CM

## 2021-07-14 MED ORDER — CITALOPRAM HYDROBROMIDE 40 MG PO TABS: 40.0000 mg | ORAL_TABLET | Freq: Every day | ORAL | 1 refills | Status: DC

## 2021-07-20 ENCOUNTER — Telehealth: Payer: Medicaid Other

## 2021-07-21 ENCOUNTER — Encounter: Payer: Self-pay | Admitting: Family Medicine

## 2021-07-21 ENCOUNTER — Ambulatory Visit: Payer: Medicaid Other | Admitting: Family Medicine

## 2021-07-21 ENCOUNTER — Other Ambulatory Visit: Payer: Self-pay

## 2021-07-21 DIAGNOSIS — F411 Generalized anxiety disorder: Secondary | ICD-10-CM

## 2021-07-21 DIAGNOSIS — R197 Diarrhea, unspecified: Secondary | ICD-10-CM

## 2021-07-21 DIAGNOSIS — K047 Periapical abscess without sinus: Secondary | ICD-10-CM | POA: Insufficient documentation

## 2021-07-21 DIAGNOSIS — L309 Dermatitis, unspecified: Secondary | ICD-10-CM

## 2021-07-21 MED ORDER — PENICILLIN V POTASSIUM 500 MG PO TABS: 500.0000 mg | ORAL_TABLET | Freq: Four times a day (QID) | ORAL | 0 refills | Status: DC

## 2021-07-21 MED ORDER — TRIAMCINOLONE ACETONIDE 0.1 % EX CREA: 1.0000 "application " | TOPICAL_CREAM | Freq: Two times a day (BID) | CUTANEOUS | 2 refills | Status: DC

## 2021-07-21 NOTE — Patient Instructions (Signed)
Stop to augmentin now. ?Start the plain old penicillin only if the abscess starts coming back before your get the root canal. ?I gave you the same eczema cream that has worked in the past. ?Please get an over-the-counter hemorrhoid ointment or cooling gel and use liberally until your bottom heals up.   ?I hope that your anxiety comes under better control as we treat these medical problems.  We are always happy to see you again if it is not improving.   ?

## 2021-07-22 ENCOUNTER — Encounter: Payer: Self-pay | Admitting: Family Medicine

## 2021-07-22 DIAGNOSIS — R197 Diarrhea, unspecified: Secondary | ICD-10-CM | POA: Insufficient documentation

## 2021-07-22 NOTE — Assessment & Plan Note (Signed)
Not surprising that it has flared during this stressful time.  Refill triamcinolone. ?

## 2021-07-22 NOTE — Assessment & Plan Note (Signed)
OK to stop augmentin now.  Added to chart as an intollerance.  Given RX for narrow spectrum Pen V to use if dental pain/swelling flares again prior to her having the root canal.   ?

## 2021-07-22 NOTE — Assessment & Plan Note (Addendum)
Flared.  Presumed due to series of health problems. No change in meds ?

## 2021-07-22 NOTE — Assessment & Plan Note (Signed)
From augmentin and should resolve as we stop today.  Caused anal irritation.  Recommended OTC hemorrhoidal ointment or gel. ?

## 2021-07-22 NOTE — Progress Notes (Signed)
? ? ?  SUBJECTIVE:  ? ?CHIEF COMPLAINT / HPI:  ? ?Stomach upset and more. ?Patient seen recently at Urgent care and prescribed augmentin for dental abscess.  Augmentin has caused considerable diarrhea even when she cut back to one pill per day. ?Dental abscess.  Has seen dentist.  Needs a root canal.  She needs to save up money - so that may not happen quickly ?Anxiety - longstanding, on multiple meds.  Worse with the illness and financial concerns.   ?Eczema - longstanding, but recently flared (from stress?).  Out of triamcinolone which has worked well before. ?Anal irritation, from diarrhea. ? ? ? ?OBJECTIVE:  ? ?BP 98/72   Pulse 83   Wt 138 lb 12.8 oz (63 kg)   BMI 28.03 kg/m?   ?Rash typical of eczema. ?Abd benign. ? ?ASSESSMENT/PLAN:  ? ?Eczema ?Not surprising that it has flared during this stressful time.  Refill triamcinolone. ? ?Dental abscess ?OK to stop augmentin now.  Added to chart as an intollerance.  Given RX for narrow spectrum Pen V to use if dental pain/swelling flares again prior to her having the root canal.   ? ?Generalized anxiety disorder ?Flared.  Presumed due to series of health problems. No change in meds ? ?Diarrhea ?From augmentin and should resolve as we stop today.  Caused anal irritation.  Recommended OTC hemorrhoidal ointment or gel. ?  ? ? ?Zenia Resides, MD ?[place]  ?

## 2021-08-03 NOTE — Progress Notes (Deleted)
? ? ?  SUBJECTIVE:  ? ?CHIEF COMPLAINT / HPI:  ? ?Dental abscess, was on augmentin having diarrhea ? ?PERTINENT  PMH / PSH: *** ? ?OBJECTIVE:  ? ?There were no vitals taken for this visit.  ?*** ? ?ASSESSMENT/PLAN:  ? ?No problem-specific Assessment & Plan notes found for this encounter. ?  ? ? ?Lenoria Chime, MD ?[place]  ? ?

## 2021-08-04 ENCOUNTER — Ambulatory Visit: Payer: Medicaid Other | Admitting: Family Medicine

## 2021-09-05 ENCOUNTER — Encounter (HOSPITAL_BASED_OUTPATIENT_CLINIC_OR_DEPARTMENT_OTHER): Payer: Self-pay

## 2021-09-05 ENCOUNTER — Other Ambulatory Visit: Payer: Self-pay

## 2021-09-05 ENCOUNTER — Emergency Department (HOSPITAL_BASED_OUTPATIENT_CLINIC_OR_DEPARTMENT_OTHER)
Admission: EM | Admit: 2021-09-05 | Discharge: 2021-09-05 | Disposition: A | Discharge status: Home / Self Care | Payer: Medicaid Other | Attending: Emergency Medicine | Admitting: Emergency Medicine

## 2021-09-05 DIAGNOSIS — J029 Acute pharyngitis, unspecified: Secondary | ICD-10-CM | POA: Diagnosis not present

## 2021-09-05 DIAGNOSIS — M542 Cervicalgia: Secondary | ICD-10-CM | POA: Insufficient documentation

## 2021-09-05 DIAGNOSIS — R509 Fever, unspecified: Secondary | ICD-10-CM | POA: Insufficient documentation

## 2021-09-05 LAB — GROUP A STREP BY PCR: Group A Strep by PCR: NOT DETECTED

## 2021-09-05 MED ORDER — CEPHALEXIN 500 MG PO CAPS: 500.0000 mg | ORAL_CAPSULE | Freq: Two times a day (BID) | ORAL | 0 refills | Status: AC

## 2021-09-05 NOTE — Discharge Instructions (Signed)
Call your primary care doctor or specialist as discussed in the next 2-3 days.   Return immediately back to the ER if:  Your symptoms worsen within the next 12-24 hours. You develop new symptoms such as new fevers, persistent vomiting, new pain, shortness of breath, or new weakness or numbness, or if you have any other concerns.  

## 2021-09-05 NOTE — ED Provider Notes (Signed)
?[place] [facility] EMERGENCY DEPT ?Provider Note ? ? ?CSN: [id number] ?Arrival date & time: 09/05/21  2013 ? ?  ? ?History ? ?Chief Complaint  ?Patient presents with  ? Sore Throat  ? ? ?Wanda Nixon is a 31 y.o. female. ? ?Patient presents to ER chief complaint of sore throat, pain on right side of the neck.  Symptoms ongoing for 4 days without improvement.  Denies any cough or vomiting or diarrhea.  Had fevers at home subjective. ? ? ?  ? ?Home Medications ?Prior to Admission medications   ?Medication Sig Start Date End Date Taking? Authorizing Provider  ?cephALEXin (KEFLEX) 500 MG capsule Take 1 capsule (500 mg total) by mouth 2 (two) times daily for 10 days. 09/05/21 09/15/21 Yes Keyontay Stolz, Greggory Brandy, MD  ?busPIRone (BUSPAR) 10 MG tablet TAKE 1 TABLET BY MOUTH THREE TIMES A DAY ?Patient not taking: Reported on 05/07/2021 09/15/20   Meccariello, Bernita Raisin, DO  ?citalopram (CELEXA) 40 MG tablet Take 1 tablet (40 mg total) by mouth daily. 07/14/21   Donney Dice, DO  ?clonazePAM (KLONOPIN) 0.5 MG tablet Take 1 tablet (0.5 mg total) by mouth 2 (two) times daily as needed for anxiety. 05/07/21   Alcus Dad, MD  ?penicillin v potassium (VEETID) 500 MG tablet Take 1 tablet (500 mg total) by mouth 4 (four) times daily. 07/21/21   Zenia Resides, MD  ?propranolol (INDERAL) 10 MG tablet Take as needed 30 minutes before speaking events. ?Patient not taking: Reported on 03/31/2021 10/24/19   Meccariello, Bernita Raisin, DO  ?triamcinolone cream (KENALOG) 0.1 % Apply 1 application. topically 2 (two) times daily. 07/21/21   Zenia Resides, MD  ?   ? ?Allergies    ?Tomato, Augmentin [amoxicillin-pot clavulanate], Mushroom extract complex, and Reglan [metoclopramide]   ? ?Review of Systems   ?Review of Systems  ?Constitutional:  Positive for fever.  ?HENT:  Negative for ear pain.   ?Eyes:  Negative for pain.  ?Respiratory:  Negative for cough.   ?Cardiovascular:  Negative for chest pain.  ?Gastrointestinal:  Negative for abdominal  pain.  ?Genitourinary:  Negative for flank pain.  ?Musculoskeletal:  Negative for back pain.  ?Skin:  Negative for rash.  ?Neurological:  Negative for headaches.  ? ?Physical Exam ?Updated Vital Signs ?BP (!) 117/96 (BP Location: Right Arm)   Pulse 76   Temp (!) 97.4 ?F (36.3 ?C)   Resp 16   Ht '4\' 11"'$  (1.499 m)   Wt 62.1 kg   LMP 09/03/2021   SpO2 100%   BMI 27.67 kg/m?  ?Physical Exam ?Constitutional:   ?   General: She is not in acute distress. ?   Appearance: Normal appearance.  ?HENT:  ?   Head: Normocephalic.  ?   Nose: Nose normal.  ?   Mouth/Throat:  ?   Mouth: Mucous membranes are moist.  ?   Tonsils: No tonsillar exudate or tonsillar abscesses.  ?   Comments: Positive posterior pharynx erythema.  No exudate noted no cobblestoning noted.  Uvula is midline no distortion noted.  Mild right-sided cervical of adenopathy noted. ?Eyes:  ?   Extraocular Movements: Extraocular movements intact.  ?Cardiovascular:  ?   Rate and Rhythm: Normal rate.  ?Pulmonary:  ?   Effort: Pulmonary effort is normal.  ?Musculoskeletal:     ?   General: Normal range of motion.  ?   Cervical back: Normal range of motion.  ?Neurological:  ?   General: No focal deficit present.  ?  Mental Status: She is alert. Mental status is at baseline.  ? ? ?ED Results / Procedures / Treatments   ?Labs ?(all labs ordered are listed, but only abnormal results are displayed) ?Labs Reviewed  ?GROUP A STREP BY PCR  ? ? ?EKG ?None ? ?Radiology ?No results found. ? ?Procedures ?Procedures  ? ? ?Medications Ordered in ED ?Medications - No data to display ? ?ED Course/ Medical Decision Making/ A&P ?  ?                        ?Medical Decision Making ?Risk ?Prescription drug management. ? ? ?Patient treated empirically for possible strep throat.  Advised outpatient follow with her doctor within the week.  Advised immediate return for worsening symptoms or any additional concerns. ? ? ? ? ? ? ? ?Final Clinical Impression(s) / ED Diagnoses ?Final  diagnoses:  ?Pharyngitis, unspecified etiology  ? ? ?Rx / DC Orders ?ED Discharge Orders   ? ?      Ordered  ?  cephALEXin (KEFLEX) 500 MG capsule  2 times daily       ? 09/05/21 2240  ? ?  ?  ? ?  ? ? ?  ?Luna Fuse, MD ?09/05/21 2242 ? ?

## 2021-09-05 NOTE — ED Triage Notes (Signed)
Pt c/o swollen nodule to the R side of the neck. Pt reports that it is painful to swallow and felt like her throat was closing. Pt's airway intact, no stridor present. Pt able to swallow secretions without difficulty. Pt reports associated fatigue and malaise.  ?

## 2021-09-07 ENCOUNTER — Other Ambulatory Visit: Payer: Self-pay

## 2021-09-07 ENCOUNTER — Encounter (HOSPITAL_BASED_OUTPATIENT_CLINIC_OR_DEPARTMENT_OTHER): Payer: Self-pay

## 2021-09-07 ENCOUNTER — Emergency Department (HOSPITAL_BASED_OUTPATIENT_CLINIC_OR_DEPARTMENT_OTHER)
Admission: EM | Admit: 2021-09-07 | Discharge: 2021-09-08 | Disposition: A | Discharge status: Home / Self Care | Payer: Medicaid Other | Attending: Emergency Medicine | Admitting: Emergency Medicine

## 2021-09-07 DIAGNOSIS — Z20822 Contact with and (suspected) exposure to covid-19: Secondary | ICD-10-CM | POA: Diagnosis not present

## 2021-09-07 DIAGNOSIS — J029 Acute pharyngitis, unspecified: Secondary | ICD-10-CM | POA: Insufficient documentation

## 2021-09-07 DIAGNOSIS — B9789 Other viral agents as the cause of diseases classified elsewhere: Secondary | ICD-10-CM | POA: Diagnosis not present

## 2021-09-07 DIAGNOSIS — J028 Acute pharyngitis due to other specified organisms: Secondary | ICD-10-CM | POA: Diagnosis not present

## 2021-09-07 DIAGNOSIS — R5383 Other fatigue: Secondary | ICD-10-CM | POA: Diagnosis present

## 2021-09-07 DIAGNOSIS — R9431 Abnormal electrocardiogram [ECG] [EKG]: Secondary | ICD-10-CM | POA: Diagnosis not present

## 2021-09-07 LAB — URINALYSIS, ROUTINE W REFLEX MICROSCOPIC
Bilirubin Urine: NEGATIVE
Glucose, UA: NEGATIVE mg/dL
Hgb urine dipstick: NEGATIVE
Ketones, ur: NEGATIVE mg/dL
Leukocytes,Ua: NEGATIVE
Nitrite: NEGATIVE
Protein, ur: NEGATIVE mg/dL
Specific Gravity, Urine: 1.023 (ref 1.005–1.030)
pH: 5.5 (ref 5.0–8.0)

## 2021-09-07 LAB — BASIC METABOLIC PANEL
Anion gap: 9 (ref 5–15)
BUN: 11 mg/dL (ref 6–20)
CO2: 26 mmol/L (ref 22–32)
Calcium: 9.1 mg/dL (ref 8.9–10.3)
Chloride: 105 mmol/L (ref 98–111)
Creatinine, Ser: 0.79 mg/dL (ref 0.44–1.00)
GFR, Estimated: >60 mL/min (ref >60)
Glucose, Bld: 80 mg/dL (ref 70–99)
Potassium: 3.9 mmol/L (ref 3.5–5.1)
Sodium: 140 mmol/L (ref 135–145)

## 2021-09-07 LAB — CBC
HCT: 36.2 % (ref 36.0–46.0)
Hemoglobin: 12.2 g/dL (ref 12.0–15.0)
MCH: 27.8 pg (ref 26.0–34.0)
MCHC: 33.7 g/dL (ref 30.0–36.0)
MCV: 82.5 fL (ref 80.0–100.0)
Platelets: 358 10*3/uL (ref 150–400)
RBC: 4.39 MIL/uL (ref 3.87–5.11)
RDW: 12.8 % (ref 11.5–15.5)
WBC: 5.8 10*3/uL (ref 4.0–10.5)
nRBC: 0 % (ref 0.0–0.2)

## 2021-09-07 LAB — RESP PANEL BY RT-PCR (FLU A&B, COVID) ARPGX2
Influenza A by PCR: NEGATIVE
Influenza B by PCR: NEGATIVE
SARS Coronavirus 2 by RT PCR: NEGATIVE

## 2021-09-07 MED ORDER — DEXAMETHASONE SODIUM PHOSPHATE 10 MG/ML IJ SOLN
10.0000 mg | Freq: Once | INTRAMUSCULAR | Status: AC
  Administered 2021-09-08: 10 mg via INTRAVENOUS
  Filled 2021-09-07: qty 1

## 2021-09-07 MED ORDER — KETOROLAC TROMETHAMINE 30 MG/ML IJ SOLN
30.0000 mg | Freq: Once | INTRAMUSCULAR | Status: AC
  Administered 2021-09-08: 30 mg via INTRAVENOUS
  Filled 2021-09-07: qty 1

## 2021-09-07 MED ORDER — LACTATED RINGERS IV BOLUS
1000.0000 mL | Freq: Once | INTRAVENOUS | Status: AC
  Administered 2021-09-08: 1000 mL via INTRAVENOUS

## 2021-09-07 MED ORDER — ONDANSETRON HCL 4 MG/2ML IJ SOLN
4.0000 mg | Freq: Once | INTRAMUSCULAR | Status: AC
  Administered 2021-09-08: 4 mg via INTRAVENOUS
  Filled 2021-09-07: qty 2

## 2021-09-07 NOTE — ED Provider Notes (Signed)
?[place] EMERGENCY DEPT ?Provider Note ? ? ?CSN: [id number] ?Arrival date & time: 09/07/21  2043 ? ?  ? ?History ? ?Chief Complaint  ?Patient presents with  ? Fatigue  ? ? ?Wanda Nixon is a 31 y.o. female. ? ?The history is provided by the patient.  ?She has history of asthma, depression and was seen in the emergency department 2 days ago for sore throat with negative strep PCR test and given a prescription for cephalexin.  She had been sick for the 4 days prior to that.  She continues to have a sore throat and is also complaining of general fatigue and body aches.  There has been some nausea and she did vomit once.  She denies constipation or diarrhea.  She denies any cough.  She denies fever, chills, sweats.  She is concerned because symptoms have been persisting in spite of the antibiotic.  Of note, she has been taking ibuprofen which has been giving her relief from myalgias. ?  ?Home Medications ?Prior to Admission medications   ?Medication Sig Start Date End Date Taking? Authorizing Provider  ?busPIRone (BUSPAR) 10 MG tablet TAKE 1 TABLET BY MOUTH THREE TIMES A DAY ?Patient not taking: Reported on 05/07/2021 09/15/20   Meccariello, Bernita Raisin, DO  ?cephALEXin (KEFLEX) 500 MG capsule Take 1 capsule (500 mg total) by mouth 2 (two) times daily for 10 days. 09/05/21 09/15/21  Luna Fuse, MD  ?citalopram (CELEXA) 40 MG tablet Take 1 tablet (40 mg total) by mouth daily. 07/14/21   Donney Dice, DO  ?clonazePAM (KLONOPIN) 0.5 MG tablet Take 1 tablet (0.5 mg total) by mouth 2 (two) times daily as needed for anxiety. 05/07/21   Alcus Dad, MD  ?penicillin v potassium (VEETID) 500 MG tablet Take 1 tablet (500 mg total) by mouth 4 (four) times daily. 07/21/21   Zenia Resides, MD  ?propranolol (INDERAL) 10 MG tablet Take as needed 30 minutes before speaking events. ?Patient not taking: Reported on 03/31/2021 10/24/19   Meccariello, Bernita Raisin, DO  ?triamcinolone cream (KENALOG) 0.1 % Apply 1 application.  topically 2 (two) times daily. 07/21/21   Zenia Resides, MD  ?   ? ?Allergies    ?Tomato, Augmentin [amoxicillin-pot clavulanate], Mushroom extract complex, and Reglan [metoclopramide]   ? ?Review of Systems   ?Review of Systems  ?All other systems reviewed and are negative. ? ?Physical Exam ?Updated Vital Signs ?BP (!) 131/95 (BP Location: Right Arm) Comment: Simultaneous filing. User may not have seen previous data.  Pulse 69 Comment: Simultaneous filing. User may not have seen previous data.  Temp 98.1 ?F (36.7 ?C)   Resp 16 Comment: Simultaneous filing. User may not have seen previous data.  Ht '4\' 11"'$  (1.499 m)   Wt 62.1 kg   LMP 09/03/2021   SpO2 100% Comment: Simultaneous filing. User may not have seen previous data.  BMI 27.65 kg/m?  ?Physical Exam ?Vitals and nursing note reviewed.  ?31 year old female, resting comfortably and in no acute distress. Vital signs are significant for elevated blood pressure. Oxygen saturation is 100%, which is normal. ?Head is normocephalic and atraumatic. PERRLA, EOMI. Oropharynx is clear. ?Neck is nontender and supple without adenopathy or JVD. ?Back is nontender and there is no CVA tenderness. ?Lungs are clear without rales, wheezes, or rhonchi. ?Chest is nontender. ?Heart has regular rate and rhythm without murmur. ?Abdomen is soft, flat, nontender. ?Extremities have no cyanosis or edema, full range of motion is present. ?Skin is warm and  dry without rash. ?Neurologic: Mental status is normal, cranial nerves are intact, moves all extremities equally. ? ?ED Results / Procedures / Treatments   ?Labs ?(all labs ordered are listed, but only abnormal results are displayed) ?Labs Reviewed  ?HEPATIC FUNCTION PANEL - Abnormal; Notable for the following components:  ?    Result Value  ? AST 13 (*)   ? All other components within normal limits  ?RESP PANEL BY RT-PCR (FLU A&B, COVID) ARPGX2  ?BASIC METABOLIC PANEL  ?CBC  ?URINALYSIS, ROUTINE W REFLEX MICROSCOPIC   ?MONONUCLEOSIS SCREEN  ? ? ?EKG ?EKG Interpretation ? ?Date/Time:  Tuesday Sep 07 2021 21:11:50 EDT ?Ventricular Rate:  74 ?PR Interval:  192 ?QRS Duration: 62 ?QT Interval:  382 ?QTC Calculation: 424 ?R Axis:   36 ?Text Interpretation: Normal sinus rhythm with sinus arrhythmia Low voltage QRS Cannot rule out Anterior infarct , age undetermined Abnormal ECG When compared with ECG of 26-Nov-2016 12:42, Nonspecific T wave abnormality has replaced inverted T waves in Anterior leads No significant change was found Confirmed by Delora Fuel ([id number]) on 09/07/2021 11:38:57 PM ? ?Procedures ?Procedures  ? ? ?Medications Ordered in ED ?Medications  ?lactated ringers bolus 1,000 mL (has no administration in time range)  ?ondansetron (ZOFRAN) injection 4 mg (has no administration in time range)  ?ketorolac (TORADOL) 30 MG/ML injection 30 mg (has no administration in time range)  ? ? ?ED Course/ Medical Decision Making/ A&P ?  ?                        ?Medical Decision Making ?Amount and/or Complexity of Data Reviewed ?Labs: ordered. ? ?Risk ?Prescription drug management. ? ? ?Sore throat, body aches, generalized fatigue consistent with a viral syndrome.  Old records reviewed confirming ED visit 2 days ago at which time strep PCR was negative and she was discharged with a prescription for cephalexin.  This is clearly not strep given negative PCR and failure to respond to antibiotics.  Consider infectious mononucleosis.  Consider influenza and COVID-19.  Respiratory pathogen panel was obtained and is negative for influenza and COVID-19.  CBC and basic metabolic panel today are normal, will check hepatic function panel to rule out hepatitis, and will check monoscreen to look for infectious mononucleosis.  She will also be given IV fluids, dexamethasone, ondansetron, ketorolac. ? ?Hepatic function panel is normal, monoscreen is negative.  She does feel slightly better following above-noted treatment.  She is discharged with  instructions to continue forcing fluids and using over-the-counter NSAIDs and acetaminophen.  Follow-up with PCP as needed. ? ?Final Clinical Impression(s) / ED Diagnoses ?Final diagnoses:  ?Viral pharyngitis  ? ? ?Rx / DC Orders ?ED Discharge Orders   ? ? None  ? ?  ? ? ?  ?Delora Fuel, MD ?21/22/48 0145 ? ?

## 2021-09-07 NOTE — ED Triage Notes (Signed)
Patient here POV from Home. ? ?Endorses Lightheadedness and Fatigue for approximately 1 Week. Worsening since it began. Was seen 2 days PTA and tested Negative for Strep. Given Antibiotics but endorses continued Symptoms.  ? ?NAD Noted during Triage. A&Ox4. GCS 15. Ambulatory.  ?

## 2021-09-08 LAB — HEPATIC FUNCTION PANEL
ALT: 9 U/L (ref 0–44)
AST: 13 U/L — ABNORMAL LOW (ref 15–41)
Albumin: 4.7 g/dL (ref 3.5–5.0)
Alkaline Phosphatase: 44 U/L (ref 38–126)
Bilirubin, Direct: 0.1 mg/dL (ref 0.0–0.2)
Indirect Bilirubin: 0.7 mg/dL (ref 0.3–0.9)
Total Bilirubin: 0.8 mg/dL (ref 0.3–1.2)
Total Protein: 7.4 g/dL (ref 6.5–8.1)

## 2021-09-08 LAB — MONONUCLEOSIS SCREEN: Mono Screen: NEGATIVE

## 2021-09-08 NOTE — Discharge Instructions (Signed)
Rest.  Drink plenty of fluids.  Take over-the-counter ibuprofen or naproxen as needed for fever or pain.  If you need additional pain or fever relief, you may add acetaminophen.  Please be aware that when you combine acetaminophen with either ibuprofen or naproxen, you get better pain and better fever relief than you get from taking either medication by itself. ?

## 2021-09-14 DIAGNOSIS — Z302 Encounter for sterilization: Secondary | ICD-10-CM | POA: Diagnosis not present

## 2021-09-16 ENCOUNTER — Encounter (HOSPITAL_BASED_OUTPATIENT_CLINIC_OR_DEPARTMENT_OTHER): Payer: Self-pay | Admitting: Obstetrics and Gynecology

## 2021-09-17 ENCOUNTER — Encounter (HOSPITAL_BASED_OUTPATIENT_CLINIC_OR_DEPARTMENT_OTHER): Payer: Self-pay | Admitting: Obstetrics and Gynecology

## 2021-09-17 ENCOUNTER — Other Ambulatory Visit: Payer: Self-pay

## 2021-09-17 NOTE — Progress Notes (Signed)
Spoke w/ via phone for pre-op interview--- pt Lab needs dos---- cbc, t&s, urine preg              Lab results------ no COVID test -----patient states asymptomatic no test needed Arrive at ------- 1030 on 09-20-2021 NPO after MN NO Solid Food.  Clear liquids from MN until--- 0930 Med rec completed Medications to take morning of surgery ----- buspar, celexa, if needed take clonazepam  Diabetic medication ----- n/a Patient instructed no nail polish to be worn day of surgery Patient instructed to bring photo id and insurance card day of surgery Patient aware to have Driver (ride ) / caregiver for 24 hours after surgery -- husband, altreyo Patient Special Instructions ----- n/a Pre-Op special Istructions ----- n/a Patient verbalized understanding of instructions that were given at this phone interview. Patient denies shortness of breath, chest pain, fever, cough at this phone interview.

## 2021-09-20 ENCOUNTER — Encounter (HOSPITAL_BASED_OUTPATIENT_CLINIC_OR_DEPARTMENT_OTHER)
Admission: RE | Disposition: A | Discharge status: Home / Self Care | Payer: Self-pay | Source: Home / Self Care | Attending: Obstetrics and Gynecology

## 2021-09-20 ENCOUNTER — Other Ambulatory Visit: Payer: Self-pay

## 2021-09-20 ENCOUNTER — Ambulatory Visit (HOSPITAL_BASED_OUTPATIENT_CLINIC_OR_DEPARTMENT_OTHER): Payer: Medicaid Other | Admitting: Anesthesiology

## 2021-09-20 ENCOUNTER — Encounter (HOSPITAL_BASED_OUTPATIENT_CLINIC_OR_DEPARTMENT_OTHER): Payer: Self-pay | Admitting: Obstetrics and Gynecology

## 2021-09-20 ENCOUNTER — Ambulatory Visit (HOSPITAL_BASED_OUTPATIENT_CLINIC_OR_DEPARTMENT_OTHER)
Admission: RE | Admit: 2021-09-20 | Discharge: 2021-09-20 | Disposition: A | Discharge status: Home / Self Care | Payer: Medicaid Other | Attending: Obstetrics and Gynecology | Admitting: Obstetrics and Gynecology

## 2021-09-20 DIAGNOSIS — Z01818 Encounter for other preprocedural examination: Secondary | ICD-10-CM

## 2021-09-20 DIAGNOSIS — J45909 Unspecified asthma, uncomplicated: Secondary | ICD-10-CM | POA: Diagnosis not present

## 2021-09-20 DIAGNOSIS — Z302 Encounter for sterilization: Secondary | ICD-10-CM

## 2021-09-20 DIAGNOSIS — N803 Endometriosis of pelvic peritoneum, unspecified: Secondary | ICD-10-CM | POA: Diagnosis not present

## 2021-09-20 HISTORY — DX: Presence of spectacles and contact lenses: Z97.3

## 2021-09-20 HISTORY — DX: Personal history of other diseases of the nervous system and sense organs: Z86.69

## 2021-09-20 HISTORY — DX: Other constipation: K59.09

## 2021-09-20 HISTORY — PX: LAPAROSCOPIC BILATERAL SALPINGECTOMY: SHX5889

## 2021-09-20 HISTORY — DX: Presence of dental prosthetic device (complete) (partial): Z97.2

## 2021-09-20 HISTORY — DX: Personal history of diseases of the blood and blood-forming organs and certain disorders involving the immune mechanism: Z86.2

## 2021-09-20 HISTORY — DX: Generalized anxiety disorder: F41.1

## 2021-09-20 LAB — CBC
HCT: 43.8 % (ref 36.0–46.0)
Hemoglobin: 15.1 g/dL — ABNORMAL HIGH (ref 12.0–15.0)
MCH: 29 pg (ref 26.0–34.0)
MCHC: 34.5 g/dL (ref 30.0–36.0)
MCV: 84.2 fL (ref 80.0–100.0)
Platelets: 294 10*3/uL (ref 150–400)
RBC: 5.2 MIL/uL — ABNORMAL HIGH (ref 3.87–5.11)
RDW: 13.1 % (ref 11.5–15.5)
WBC: 5 10*3/uL (ref 4.0–10.5)
nRBC: 0 % (ref 0.0–0.2)

## 2021-09-20 LAB — TYPE AND SCREEN
ABO/RH(D): O POS
Antibody Screen: NEGATIVE

## 2021-09-20 LAB — POCT PREGNANCY, URINE: Preg Test, Ur: NEGATIVE

## 2021-09-20 SURGERY — SALPINGECTOMY, BILATERAL, LAPAROSCOPIC
Anesthesia: General | Site: Abdomen | Laterality: Bilateral

## 2021-09-20 MED ORDER — LACTATED RINGERS IV SOLN: INTRAVENOUS | Status: DC

## 2021-09-20 MED ORDER — ONDANSETRON HCL 4 MG/2ML IJ SOLN
INTRAMUSCULAR | Status: AC
  Filled 2021-09-20: qty 2

## 2021-09-20 MED ORDER — POVIDONE-IODINE 10 % EX SWAB: 2.0000 "application " | Freq: Once | CUTANEOUS | Status: DC

## 2021-09-20 MED ORDER — ONDANSETRON HCL 4 MG/2ML IJ SOLN
INTRAMUSCULAR | Status: DC | PRN
  Administered 2021-09-20: 4 mg via INTRAVENOUS

## 2021-09-20 MED ORDER — BUPIVACAINE HCL 0.5 % IJ SOLN
INTRAMUSCULAR | Status: DC | PRN
  Administered 2021-09-20: 20 mL

## 2021-09-20 MED ORDER — LIDOCAINE HCL (PF) 2 % IJ SOLN
INTRAMUSCULAR | Status: AC
  Filled 2021-09-20: qty 5

## 2021-09-20 MED ORDER — GLYCOPYRROLATE 0.2 MG/ML IJ SOLN
INTRAMUSCULAR | Status: DC | PRN
  Administered 2021-09-20: .2 mg via INTRAVENOUS

## 2021-09-20 MED ORDER — FENTANYL CITRATE (PF) 100 MCG/2ML IJ SOLN
INTRAMUSCULAR | Status: AC
  Filled 2021-09-20: qty 2

## 2021-09-20 MED ORDER — ACETAMINOPHEN 500 MG PO TABS
1000.0000 mg | ORAL_TABLET | Freq: Once | ORAL | Status: AC
  Administered 2021-09-20: 1000 mg via ORAL

## 2021-09-20 MED ORDER — KETOROLAC TROMETHAMINE 30 MG/ML IJ SOLN
INTRAMUSCULAR | Status: DC | PRN
  Administered 2021-09-20: 30 mg via INTRAVENOUS

## 2021-09-20 MED ORDER — 0.9 % SODIUM CHLORIDE (POUR BTL) OPTIME
TOPICAL | Status: DC | PRN
  Administered 2021-09-20: 500 mL

## 2021-09-20 MED ORDER — GLYCOPYRROLATE PF 0.2 MG/ML IJ SOSY
PREFILLED_SYRINGE | INTRAMUSCULAR | Status: AC
  Filled 2021-09-20: qty 1

## 2021-09-20 MED ORDER — PROPOFOL 10 MG/ML IV BOLUS
INTRAVENOUS | Status: DC | PRN
  Administered 2021-09-20: 120 mg via INTRAVENOUS

## 2021-09-20 MED ORDER — DEXAMETHASONE SODIUM PHOSPHATE 10 MG/ML IJ SOLN
INTRAMUSCULAR | Status: AC
  Filled 2021-09-20: qty 1

## 2021-09-20 MED ORDER — ROCURONIUM BROMIDE 10 MG/ML (PF) SYRINGE
PREFILLED_SYRINGE | INTRAVENOUS | Status: DC | PRN
  Administered 2021-09-20: 60 mg via INTRAVENOUS

## 2021-09-20 MED ORDER — FENTANYL CITRATE (PF) 100 MCG/2ML IJ SOLN
25.0000 ug | INTRAMUSCULAR | Status: DC | PRN
  Administered 2021-09-20 (×2): 25 ug via INTRAVENOUS

## 2021-09-20 MED ORDER — ACETAMINOPHEN 500 MG PO TABS
ORAL_TABLET | ORAL | Status: AC
  Filled 2021-09-20: qty 2

## 2021-09-20 MED ORDER — SUGAMMADEX SODIUM 200 MG/2ML IV SOLN
INTRAVENOUS | Status: DC | PRN
  Administered 2021-09-20: 200 mg via INTRAVENOUS

## 2021-09-20 MED ORDER — KETOROLAC TROMETHAMINE 30 MG/ML IJ SOLN
INTRAMUSCULAR | Status: AC
  Filled 2021-09-20: qty 1

## 2021-09-20 MED ORDER — FENTANYL CITRATE (PF) 100 MCG/2ML IJ SOLN
INTRAMUSCULAR | Status: DC | PRN
  Administered 2021-09-20: 100 ug via INTRAVENOUS
  Administered 2021-09-20 (×2): 50 ug via INTRAVENOUS

## 2021-09-20 MED ORDER — MIDAZOLAM HCL 5 MG/5ML IJ SOLN
INTRAMUSCULAR | Status: DC | PRN
  Administered 2021-09-20: 2 mg via INTRAVENOUS

## 2021-09-20 MED ORDER — DEXAMETHASONE SODIUM PHOSPHATE 10 MG/ML IJ SOLN
INTRAMUSCULAR | Status: DC | PRN
  Administered 2021-09-20: 10 mg via INTRAVENOUS

## 2021-09-20 MED ORDER — LIDOCAINE 2% (20 MG/ML) 5 ML SYRINGE
INTRAMUSCULAR | Status: DC | PRN
  Administered 2021-09-20: 40 mg via INTRAVENOUS

## 2021-09-20 MED ORDER — ROCURONIUM BROMIDE 10 MG/ML (PF) SYRINGE
PREFILLED_SYRINGE | INTRAVENOUS | Status: AC
  Filled 2021-09-20: qty 10

## 2021-09-20 MED ORDER — OXYCODONE-ACETAMINOPHEN 2.5-325 MG PO TABS: 1.0000 | ORAL_TABLET | Freq: Four times a day (QID) | ORAL | 0 refills | Status: DC | PRN

## 2021-09-20 MED ORDER — MIDAZOLAM HCL 2 MG/2ML IJ SOLN
INTRAMUSCULAR | Status: AC
  Filled 2021-09-20: qty 2

## 2021-09-20 MED ORDER — IBUPROFEN 600 MG PO TABS: 600.0000 mg | ORAL_TABLET | Freq: Four times a day (QID) | ORAL | 0 refills | Status: DC | PRN

## 2021-09-20 SURGICAL SUPPLY — 38 items
ADH SKN CLS APL DERMABOND .7 (GAUZE/BANDAGES/DRESSINGS) ×1
BAG RETRIEVAL 10 (BASKET)
BARRIER ADHS 3X4 INTERCEED (GAUZE/BANDAGES/DRESSINGS) IMPLANT
BRR ADH 4X3 ABS CNTRL BYND (GAUZE/BANDAGES/DRESSINGS)
COVER MAYO STAND STRL (DRAPES) ×2 IMPLANT
DERMABOND ADVANCED (GAUZE/BANDAGES/DRESSINGS) ×1
DERMABOND ADVANCED .7 DNX12 (GAUZE/BANDAGES/DRESSINGS) ×1 IMPLANT
DRSG OPSITE POSTOP 3X4 (GAUZE/BANDAGES/DRESSINGS) ×2 IMPLANT
DRSG VASELINE 3X18 (GAUZE/BANDAGES/DRESSINGS) IMPLANT
DURAPREP 26ML APPLICATOR (WOUND CARE) ×2 IMPLANT
FORCEPS CUTTING 33CM 5MM (CUTTING FORCEPS) ×1 IMPLANT
FORCEPS CUTTING 45CM 5MM (CUTTING FORCEPS) ×1 IMPLANT
GAUZE 4X4 16PLY ~~LOC~~+RFID DBL (SPONGE) ×4 IMPLANT
GLOVE BIO SURGEON STRL SZ 6.5 (GLOVE) ×2 IMPLANT
GLOVE BIOGEL PI IND STRL 7.0 (GLOVE) ×3 IMPLANT
GLOVE BIOGEL PI INDICATOR 7.0 (GLOVE) ×3
GOWN STRL REUS W/TWL LRG LVL3 (GOWN DISPOSABLE) ×4 IMPLANT
KIT TURNOVER CYSTO (KITS) ×2 IMPLANT
MANIPULATOR UTERINE 4.5 ZUMI (MISCELLANEOUS) IMPLANT
NS IRRIG 1000ML POUR BTL (IV SOLUTION) IMPLANT
PACK LAPAROSCOPY BASIN (CUSTOM PROCEDURE TRAY) ×2 IMPLANT
PACK TRENDGUARD 450 HYBRID PRO (MISCELLANEOUS) ×1 IMPLANT
PROTECTOR NERVE ULNAR (MISCELLANEOUS) ×4 IMPLANT
SCISSORS LAP 5X35 DISP (ENDOMECHANICALS) IMPLANT
SET SUCTION IRRIG HYDROSURG (IRRIGATION / IRRIGATOR) IMPLANT
SET TUBE SMOKE EVAC HIGH FLOW (TUBING) ×2 IMPLANT
SOLUTION ELECTROLUBE (MISCELLANEOUS) ×2 IMPLANT
SUT MNCRL AB 4-0 PS2 18 (SUTURE) ×2 IMPLANT
SUT VICRYL 0 UR6 27IN ABS (SUTURE) ×2 IMPLANT
SYR 50ML LL SCALE MARK (SYRINGE) ×1 IMPLANT
SYS BAG RETRIEVAL 10MM (BASKET)
SYSTEM BAG RETRIEVAL 10MM (BASKET) IMPLANT
TOWEL OR 17X26 10 PK STRL BLUE (TOWEL DISPOSABLE) ×2 IMPLANT
TRAY FOLEY W/BAG SLVR 14FR LF (SET/KITS/TRAYS/PACK) ×2 IMPLANT
TRENDGUARD 450 HYBRID PRO PACK (MISCELLANEOUS) ×2
TROCAR BALLN 12MMX100 BLUNT (TROCAR) ×2 IMPLANT
TROCAR BLADELESS OPT 5 100 (ENDOMECHANICALS) ×4 IMPLANT
WARMER LAPAROSCOPE (MISCELLANEOUS) ×2 IMPLANT

## 2021-09-20 NOTE — H&P (Addendum)
Wanda Nixon is an 31 y.o. female. Presenting for salpingectomy for sterilization.    Pertinent Gynecological History: Menses: flow is moderate Bleeding: normal menses Contraception:  spermicide DES exposure: denies Blood transfusions: none Sexually transmitted diseases: no past history Previous GYN Procedures:  na   Last mammogram:  na  Date:  Last pap: normal Date: 2022 OB History: G5, P3   Menstrual History: Menarche age: 53 Patient's last menstrual period was 09/01/2021 (exact date).    Past Medical History:  Diagnosis Date   Asthma    Chronic constipation    Depression    Eczema    GAD (generalized anxiety disorder)    History of anemia    History of migraine    Wears glasses    Wears partial dentures    upper    Past Surgical History:  Procedure Laterality Date   NO PAST SURGERIES     WISDOM TOOTH EXTRACTION      Family History  Problem Relation Age of Onset   Diabetes Maternal Grandmother    Hypertension Maternal Grandmother    Stroke Maternal Grandmother    Hypertension Mother    Hyperlipidemia Mother    Hyperlipidemia Father     Social History:  reports that she has never smoked. She has never used smokeless tobacco. She reports current alcohol use. She reports that she does not use drugs.  Allergies:  Allergies  Allergen Reactions   Tomato Hives and Itching   Augmentin [Amoxicillin-Pot Clavulanate] Diarrhea   Mushroom Extract Complex Hives and Itching   Reglan [Metoclopramide] Anxiety    Pt reports makes her anxiety worse.     Medications Prior to Admission  Medication Sig Dispense Refill Last Dose   ALBUTEROL IN Inhale into the lungs as needed.   Past Week   busPIRone (BUSPAR) 10 MG tablet TAKE 1 TABLET BY MOUTH THREE TIMES A DAY (Patient taking differently: Take 10 mg by mouth 3 (three) times daily.) 270 tablet 1 Past Month   citalopram (CELEXA) 40 MG tablet Take 1 tablet (40 mg total) by mouth daily. (Patient taking differently: Take  40 mg by mouth daily.) 90 tablet 1 09/19/2021   triamcinolone cream (KENALOG) 0.1 % Apply 1 application. topically 2 (two) times daily. 80 g 2 09/19/2021   clonazePAM (KLONOPIN) 0.5 MG tablet Take 1 tablet (0.5 mg total) by mouth 2 (two) times daily as needed for anxiety. 8 tablet 0 More than a month   docusate sodium (COLACE) 100 MG capsule Take 100 mg by mouth 2 (two) times daily as needed for mild constipation.   Unknown   propranolol (INDERAL) 10 MG tablet Take as needed 30 minutes before speaking events. (Patient not taking: Reported on 03/31/2021) 90 tablet 0 Unknown    Review of Systems  Blood pressure 121/90, pulse 95, temperature 98.2 F (36.8 C), temperature source Oral, resp. rate 15, height '4\' 11"'$  (1.499 m), weight 61 kg, last menstrual period 09/01/2021, SpO2 100 %. Physical Exam Physical Examination: General appearance - alert, well appearing, and in no distress Heart - normal rate and regular rhythm Abdomen - soft, nontender, nondistended, no masses or organomegaly Pelvic - normal external genitalia, vulva, vagina, cervix, uterus and adnexa Extremities - peripheral pulses normal, no pedal edema, no clubbing or cyanosis   Results for orders placed or performed during the hospital encounter of 09/20/21 (from the past 24 hour(s))  Pregnancy, urine POC     Status: None   Collection Time: 09/20/21 10:38 AM  Result Value Ref  Range   Preg Test, Ur NEGATIVE NEGATIVE  CBC     Status: Abnormal   Collection Time: 09/20/21 11:20 AM  Result Value Ref Range   WBC 5.0 4.0 - 10.5 K/uL   RBC 5.20 (H) 3.87 - 5.11 MIL/uL   Hemoglobin 15.1 (H) 12.0 - 15.0 g/dL   HCT 43.8 36.0 - 46.0 %   MCV 84.2 80.0 - 100.0 fL   MCH 29.0 26.0 - 34.0 pg   MCHC 34.5 30.0 - 36.0 g/dL   RDW 13.1 11.5 - 15.5 %   Platelets 294 150 - 400 K/uL   nRBC 0.0 0.0 - 0.2 %    No results found.  Assessment/Plan: Pt desires sterilization.  All BC reviewed.  She has had an IUD and the patch in the past.  She  understands the risks to be but not limited to bleeding infection and damage to internal organs BP slightly elevated will monitor closely   All labs seen.  Pt seen and ready for surgery  Socrates Cahoon A Bonnee Zertuche 09/20/2021, 12:23 PM

## 2021-09-20 NOTE — Anesthesia Preprocedure Evaluation (Signed)
Anesthesia Evaluation  Patient identified by MRN, date of birth, ID band Patient awake    Reviewed: Allergy & Precautions, NPO status , Patient's Chart, lab work & pertinent test results  Airway Mallampati: II  TM Distance: >3 FB Neck ROM: Full    Dental  (+) Partial Upper, Dental Advisory Given, Poor Dentition   Pulmonary asthma ,    Pulmonary exam normal breath sounds clear to auscultation       Cardiovascular negative cardio ROS Normal cardiovascular exam Rhythm:Regular Rate:Normal     Neuro/Psych  Headaches, PSYCHIATRIC DISORDERS Anxiety Depression    GI/Hepatic negative GI ROS, Neg liver ROS,   Endo/Other  negative endocrine ROS  Renal/GU negative Renal ROS  negative genitourinary   Musculoskeletal negative musculoskeletal ROS (+)   Abdominal   Peds  Hematology negative hematology ROS (+)   Anesthesia Other Findings   Reproductive/Obstetrics                            Anesthesia Physical Anesthesia Plan  ASA: 2  Anesthesia Plan: General   Post-op Pain Management: Tylenol PO (pre-op)*   Induction: Intravenous  PONV Risk Score and Plan: 3 and Midazolam, Dexamethasone and Ondansetron  Airway Management Planned: Oral ETT  Additional Equipment:   Intra-op Plan:   Post-operative Plan: Extubation in OR  Informed Consent: I have reviewed the patients History and Physical, chart, labs and discussed the procedure including the risks, benefits and alternatives for the proposed anesthesia with the patient or authorized representative who has indicated his/her understanding and acceptance.     Dental advisory given  Plan Discussed with: CRNA  Anesthesia Plan Comments:         Anesthesia Quick Evaluation

## 2021-09-20 NOTE — Discharge Instructions (Signed)

## 2021-09-20 NOTE — Anesthesia Procedure Notes (Signed)
Procedure Name: Intubation Date/Time: 09/20/2021 1:04 PM Performed by: Rogers Blocker, CRNA Pre-anesthesia Checklist: Patient identified, Emergency Drugs available, Suction available and Patient being monitored Patient Re-evaluated:Patient Re-evaluated prior to induction Oxygen Delivery Method: Circle System Utilized Preoxygenation: Pre-oxygenation with 100% oxygen Induction Type: IV induction Ventilation: Mask ventilation without difficulty Laryngoscope Size: Mac and 3 Grade View: Grade I Tube type: Oral Tube size: 7.0 mm Number of attempts: 1 Airway Equipment and Method: Stylet and Bite block Placement Confirmation: ETT inserted through vocal cords under direct vision, positive ETCO2 and breath sounds checked- equal and bilateral Secured at: 20 cm Tube secured with: Tape Dental Injury: Teeth and Oropharynx as per pre-operative assessment

## 2021-09-20 NOTE — Transfer of Care (Signed)
Immediate Anesthesia Transfer of Care Note  Patient: Wanda Nixon  Procedure(s) Performed: LAPAROSCOPIC BILATERAL SALPINGECTOMY (Bilateral: Abdomen)  Patient Location: PACU  Anesthesia Type:General  Level of Consciousness: awake, alert , oriented and patient cooperative  Airway & Oxygen Therapy: Patient Spontanous Breathing  Post-op Assessment: Report given to RN and Post -op Vital signs reviewed and stable  Post vital signs: Reviewed and stable  Last Vitals:  Vitals Value Taken Time  BP 112/89 09/20/21 1407  Temp    Pulse 107 09/20/21 1410  Resp 26 09/20/21 1410  SpO2 99 % 09/20/21 1410  Vitals shown include unvalidated device data.  Last Pain:  Vitals:   09/20/21 1100  TempSrc: Oral  PainSc: 0-No pain      Patients Stated Pain Goal: 5 (97/98/92 1194)  Complications: No notable events documented.

## 2021-09-21 ENCOUNTER — Encounter (HOSPITAL_BASED_OUTPATIENT_CLINIC_OR_DEPARTMENT_OTHER): Payer: Self-pay | Admitting: Obstetrics and Gynecology

## 2021-09-21 LAB — SURGICAL PATHOLOGY

## 2021-09-21 NOTE — Anesthesia Postprocedure Evaluation (Signed)
Anesthesia Post Note  Patient: Wanda Nixon  Procedure(s) Performed: LAPAROSCOPIC BILATERAL SALPINGECTOMY (Bilateral: Abdomen)     Patient location during evaluation: PACU Anesthesia Type: General Level of consciousness: awake and alert Pain management: pain level controlled Vital Signs Assessment: post-procedure vital signs reviewed and stable Respiratory status: spontaneous breathing, nonlabored ventilation, respiratory function stable and patient connected to nasal cannula oxygen Cardiovascular status: blood pressure returned to baseline and stable Postop Assessment: no apparent nausea or vomiting Anesthetic complications: no   No notable events documented.  Last Vitals:  Vitals:   09/20/21 1446 09/20/21 1510  BP:  132/87  Pulse: (!) 104 94  Resp: 16 17  Temp:  37 C  SpO2: 99% 100%    Last Pain:  Vitals:   09/20/21 1510  TempSrc:   PainSc: 4    Pain Goal: Patients Stated Pain Goal: 4 (09/20/21 1510)                 Raeleigh Guinn L Deriyah Kunath

## 2021-10-04 DIAGNOSIS — R0982 Postnasal drip: Secondary | ICD-10-CM | POA: Diagnosis not present

## 2021-10-04 DIAGNOSIS — J040 Acute laryngitis: Secondary | ICD-10-CM | POA: Diagnosis not present

## 2021-10-05 ENCOUNTER — Encounter: Payer: Self-pay | Admitting: *Deleted
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
## 2021-10-14 DIAGNOSIS — Z4801 Encounter for change or removal of surgical wound dressing: Secondary | ICD-10-CM | POA: Diagnosis not present

## 2021-10-14 DIAGNOSIS — R1033 Periumbilical pain: Secondary | ICD-10-CM | POA: Diagnosis not present

## 2021-10-20 DIAGNOSIS — T63441A Toxic effect of venom of bees, accidental (unintentional), initial encounter: Secondary | ICD-10-CM | POA: Diagnosis not present

## 2021-11-17 DIAGNOSIS — N946 Dysmenorrhea, unspecified: Secondary | ICD-10-CM | POA: Insufficient documentation

## 2021-12-07 ENCOUNTER — Telehealth: Payer: Self-pay

## 2021-12-07 NOTE — Telephone Encounter (Signed)
Patient calls nurse line regarding increased anxiety. Patient lost her grandmother over the weekend and this has triggered her anxiety.   She has previously been on Clonazepam and is requesting refill. This has not been prescribed since 05/07/21. Patient states that she was told she would need an appointment for any additional refills.   Scheduled on Thursday afternoon in [facility]. This was okayed per Dr. Rock Nephew.   Patient is requesting medication to help her sleep until appointment on 8/10. Patient reports that she has taken benadryl, however, has still not been able to sleep.   Will forward to PCP for further advisement.   Talbot Grumbling, RN

## 2021-12-09 ENCOUNTER — Ambulatory Visit: Payer: Medicaid Other | Admitting: Family Medicine

## 2021-12-09 VITALS — BP 117/84 | HR 85 | Ht 59.0 in | Wt 134.0 lb

## 2021-12-09 DIAGNOSIS — F4321 Adjustment disorder with depressed mood: Secondary | ICD-10-CM | POA: Diagnosis not present

## 2021-12-09 DIAGNOSIS — F411 Generalized anxiety disorder: Secondary | ICD-10-CM

## 2021-12-09 DIAGNOSIS — F418 Other specified anxiety disorders: Secondary | ICD-10-CM | POA: Diagnosis not present

## 2021-12-09 DIAGNOSIS — L309 Dermatitis, unspecified: Secondary | ICD-10-CM | POA: Diagnosis not present

## 2021-12-09 MED ORDER — TRIAMCINOLONE ACETONIDE 0.1 % EX CREA: 1.0000 | TOPICAL_CREAM | Freq: Two times a day (BID) | CUTANEOUS | 0 refills | Status: DC

## 2021-12-09 MED ORDER — BUSPIRONE HCL 5 MG PO TABS: 5.0000 mg | ORAL_TABLET | Freq: Three times a day (TID) | ORAL | 0 refills | Status: DC | PRN

## 2021-12-09 MED ORDER — CLONAZEPAM 0.5 MG PO TABS: 0.5000 mg | ORAL_TABLET | Freq: Two times a day (BID) | ORAL | 0 refills | Status: DC | PRN

## 2021-12-09 NOTE — Patient Instructions (Signed)
It was lovely to see you, as always. I wish it were under better circumstances.  I'm very sorry to hear about your Grandma. I am glad you had so many good times with her, though.  I have sent a few tablets of Klonopin to your pharmacy to get you through the funeral.  Continue your Celexa. When you're ready, you can try the Buspar '5mg'$  (half your previous dose). The Buspar can be taken UP TO 3 times daily as needed.  Try a lower dose of melatonin (1-'3mg'$ ) for sleep.  Grief counseling resources. Check out their websites. [facility] [phone]. [facility].   Take care, Dr Rock Nephew

## 2021-12-09 NOTE — Progress Notes (Signed)
    SUBJECTIVE:   CHIEF COMPLAINT / HPI:   Anxiety, Grief -patient's Grandmother died 4 days ago -this triggered her anxiety -overall she's having a hard time -they were very close -trying to keep it together but feels like she's frequently on the verge of a panic attack -didn't sleep for a few nights -last night finally slept some after taking melatonin -prior to her grandmother's death she had been doing really well with her anxiety -taking Celexa '40mg'$  daily -engaged in weekly therapy  -Previously used clonazepam as needed for severe episodes of anxiety--usually in the setting of family death (last filled May 17, 2021) -Requests refill to help her get through the funeral -She is organizing funeral details -Feels super anxious just thinking about the funeral  Of note, tried buspar '10mg'$  in the past, noticed some benefit but it made her super groggy with slowed thinking so she stopped it.  PERTINENT  PMH / PSH: Generalized anxiety disorder  OBJECTIVE:   BP 117/84   Pulse 85   Ht '4\' 11"'$  (1.499 m)   Wt 134 lb (60.8 kg)   LMP 11/25/2021 (Exact Date)   SpO2 100%   BMI 27.06 kg/m   General: well-appearing, able to participate in exam Respiratory: No respiratory distress Skin: warm and dry, no rashes noted Psych: Tearful, anxious, normal though content and speech, good insight Neuro: grossly intact  ASSESSMENT/PLAN:   Generalized anxiety disorder Currently flaring due to recent death of her grandmother. GAD-7 score of 21 today (extremely difficult). -Continue Celexa '40mg'$  daily -Continue weekly therapy -PDMP reviewed and Rx sent for few tablets of klonopin 0.'5mg'$  prn for severe anxiety or panic attacks -After funeral and things have settled somewhat, patient to re-trial buspar but at lower dose ('5mg'$  prn) -Melatonin 1-'3mg'$  prn at bedtime for sleep -Grief counseling resources given in AVS -Return in 6 weeks for mood check.     Alcus Dad, MD [place]

## 2021-12-10 NOTE — Assessment & Plan Note (Addendum)
Currently flaring due to recent death of her grandmother. GAD-7 score of 21 today (extremely difficult). -Continue Celexa '40mg'$  daily -Continue weekly therapy -PDMP reviewed and Rx sent for few tablets of klonopin 0.'5mg'$  prn for severe anxiety or panic attacks -After funeral and things have settled somewhat, patient to re-trial buspar but at lower dose ('5mg'$  prn) -Melatonin 1-'3mg'$  prn at bedtime for sleep -Grief counseling resources given in AVS -Return in 6 weeks for mood check.

## 2021-12-13 ENCOUNTER — Ambulatory Visit: Payer: Medicaid Other | Admitting: Family Medicine

## 2021-12-25 ENCOUNTER — Other Ambulatory Visit: Payer: Self-pay | Admitting: Family Medicine

## 2021-12-25 DIAGNOSIS — F411 Generalized anxiety disorder: Secondary | ICD-10-CM

## 2022-01-04 ENCOUNTER — Ambulatory Visit (INDEPENDENT_AMBULATORY_CARE_PROVIDER_SITE_OTHER): Payer: Medicaid Other | Admitting: Student

## 2022-01-04 VITALS — BP 114/95 | HR 78 | Temp 99.0°F | Ht 59.0 in | Wt 129.8 lb

## 2022-01-04 DIAGNOSIS — U071 COVID-19: Secondary | ICD-10-CM | POA: Diagnosis not present

## 2022-01-04 NOTE — Patient Instructions (Addendum)
It was wonderful to see you today. Thank you for allowing me to be a part of your care. Below is a short summary of what we discussed at your visit today:  Glad to hear your COVID symptoms are improved. If no fever in 24 hr you can end you isolation on the 6th day since start of your symptom.  Continue wearing high-quality mask until 01/09/2022.  Please bring all of your medications to every appointment!  If you have any questions or concerns, please do not hesitate to contact us via phone or MyChart message.   Alen Bleacher, MD [facility]

## 2022-01-04 NOTE — Progress Notes (Signed)
    SUBJECTIVE:   CHIEF COMPLAINT / HPI:   31 year old female who recently tested COVID presents today for follow-up. Patient was seen at urgent care 2 days ago for her  URI symptoms Symptoms started with sore throat on Thursday and Saturday she developed generalized body ache prompting visit to urgent care where she was prescribe Plaxvoid She has only taken 2 doses of the medication but stop because it made her nauseous Symptoms have since improved and today she feels much better other than nasal congestion.   PERTINENT  PMH / PSH: Asthma  OBJECTIVE:   BP (!) 114/95   Pulse 78   Temp 99 F (37.2 C) (Oral)   Ht '4\' 11"'$  (1.499 m)   Wt 129 lb 12.8 oz (58.9 kg)   LMP 12/17/2021 (Approximate)   SpO2 100%   BMI 26.22 kg/m    Physical Exam General: Alert, well appearing, NAD HEENT: No oropharyngeal erythema, no lymphadenopathy Cardiovascular: RRR, No Murmurs, Normal S2/S2 Respiratory: CTAB, No wheezing or Rales.  Normal WOB Skin: Warm and dry  ASSESSMENT/PLAN:    Recent Positive COVID-19 Patient who recently tested positive for COVID reports improved symptoms.  Her physical exam today was unremarkable and vitals stable. -Informed patient to continue isolation until day 6 from start of symptoms per CDC guidelines. -Provided patient with return to work letter. -Reviewed return precautions.   Alen Bleacher, MD [place]

## 2022-01-13 ENCOUNTER — Other Ambulatory Visit: Payer: Self-pay

## 2022-01-13 DIAGNOSIS — L309 Dermatitis, unspecified: Secondary | ICD-10-CM

## 2022-01-14 MED ORDER — TRIAMCINOLONE ACETONIDE 0.1 % EX CREA: 1.0000 | TOPICAL_CREAM | Freq: Two times a day (BID) | CUTANEOUS | 0 refills | Status: DC

## 2022-02-08 ENCOUNTER — Ambulatory Visit: Payer: Medicaid Other | Admitting: Family Medicine

## 2022-02-21 ENCOUNTER — Other Ambulatory Visit: Payer: Self-pay

## 2022-02-21 DIAGNOSIS — F411 Generalized anxiety disorder: Secondary | ICD-10-CM

## 2022-02-21 DIAGNOSIS — F418 Other specified anxiety disorders: Secondary | ICD-10-CM

## 2022-02-22 MED ORDER — CLONAZEPAM 0.5 MG PO TABS: 0.5000 mg | ORAL_TABLET | Freq: Two times a day (BID) | ORAL | 0 refills | Status: DC | PRN

## 2022-03-15 ENCOUNTER — Ambulatory Visit (HOSPITAL_COMMUNITY)
Admission: RE | Admit: 2022-03-15 | Discharge: 2022-03-15 | Disposition: A | Discharge status: Home / Self Care | Payer: Medicaid Other | Source: Ambulatory Visit | Attending: Family Medicine | Admitting: Family Medicine

## 2022-03-15 ENCOUNTER — Encounter: Payer: Self-pay | Admitting: Family Medicine

## 2022-03-15 ENCOUNTER — Ambulatory Visit (INDEPENDENT_AMBULATORY_CARE_PROVIDER_SITE_OTHER): Payer: Medicaid Other | Admitting: Family Medicine

## 2022-03-15 VITALS — BP 118/84 | HR 85 | Ht 59.0 in | Wt 125.1 lb

## 2022-03-15 DIAGNOSIS — M25531 Pain in right wrist: Secondary | ICD-10-CM | POA: Insufficient documentation

## 2022-03-15 NOTE — Assessment & Plan Note (Addendum)
No obvious indications of fracture on physical exam.  No bruising.  Possible concern for scaphoid fracture, or other occult fracture of radius ulna or bones of the hand, likely additional soft tissue damage to muscles that will improve with conservative management, but will rule out fracture. - Counseled on symptomatic management for pain as needed with ibuprofen and Tylenol. - Hand spica splint placed in clinic, as well as sling. - Instructed to go to [facility] for radiograph imaging of right wrist, right forearm, right elbow -Plan for repeat x-rays at 2 weeks for possible scaphoid fracture.

## 2022-03-15 NOTE — Patient Instructions (Signed)
It was great to see you! Thank you for allowing me to participate in your care!  I recommend that you always bring your medications to each appointment as this makes it easy to ensure we are on the correct medications and helps [place] not miss when refills are needed.  Our plans for today:  -Please go over [facility] to get your x-rays.  I will call you about the results or message you if they are normal on MyChart. -Please control your pain with ibuprofen and Tylenol as needed. -Please remain in this thumb brace until you hear about the results of the x-rays.   Take care and seek immediate care sooner if you develop any concerns.   Dr. Salvadore Oxford, MD [place]
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
## 2022-03-18 ENCOUNTER — Other Ambulatory Visit: Payer: Self-pay | Admitting: Family Medicine

## 2022-03-18 DIAGNOSIS — M25531 Pain in right wrist: Secondary | ICD-10-CM

## 2022-03-20 ENCOUNTER — Other Ambulatory Visit: Payer: Self-pay | Admitting: Family Medicine

## 2022-03-20 DIAGNOSIS — F411 Generalized anxiety disorder: Secondary | ICD-10-CM

## 2022-03-22 ENCOUNTER — Other Ambulatory Visit: Payer: Self-pay

## 2022-03-22 ENCOUNTER — Emergency Department (HOSPITAL_BASED_OUTPATIENT_CLINIC_OR_DEPARTMENT_OTHER)
Admission: EM | Admit: 2022-03-22 | Discharge: 2022-03-22 | Disposition: A | Discharge status: Home / Self Care | Payer: Medicaid Other | Attending: Emergency Medicine | Admitting: Emergency Medicine

## 2022-03-22 ENCOUNTER — Encounter (HOSPITAL_BASED_OUTPATIENT_CLINIC_OR_DEPARTMENT_OTHER): Payer: Self-pay

## 2022-03-22 DIAGNOSIS — R5383 Other fatigue: Secondary | ICD-10-CM | POA: Diagnosis not present

## 2022-03-22 DIAGNOSIS — R197 Diarrhea, unspecified: Secondary | ICD-10-CM | POA: Insufficient documentation

## 2022-03-22 LAB — COMPREHENSIVE METABOLIC PANEL
ALT: 9 U/L (ref 0–44)
AST: 17 U/L (ref 15–41)
Albumin: 4.5 g/dL (ref 3.5–5.0)
Alkaline Phosphatase: 43 U/L (ref 38–126)
Anion gap: 8 (ref 5–15)
BUN: 15 mg/dL (ref 6–20)
CO2: 26 mmol/L (ref 22–32)
Calcium: 8.9 mg/dL (ref 8.9–10.3)
Chloride: 104 mmol/L (ref 98–111)
Creatinine, Ser: 0.77 mg/dL (ref 0.44–1.00)
GFR, Estimated: >60 mL/min (ref >60)
Glucose, Bld: 87 mg/dL (ref 70–99)
Potassium: 3.5 mmol/L (ref 3.5–5.1)
Sodium: 138 mmol/L (ref 135–145)
Total Bilirubin: 0.5 mg/dL (ref 0.3–1.2)
Total Protein: 7.8 g/dL (ref 6.5–8.1)

## 2022-03-22 LAB — URINALYSIS, ROUTINE W REFLEX MICROSCOPIC
Bilirubin Urine: NEGATIVE
Glucose, UA: NEGATIVE mg/dL
Ketones, ur: NEGATIVE mg/dL
Leukocytes,Ua: NEGATIVE
Nitrite: NEGATIVE
Protein, ur: NEGATIVE mg/dL
Specific Gravity, Urine: 1.031 — ABNORMAL HIGH (ref 1.005–1.030)
pH: 6 (ref 5.0–8.0)

## 2022-03-22 LAB — PREGNANCY, URINE: Preg Test, Ur: NEGATIVE

## 2022-03-22 LAB — CBC
HCT: 35.1 % — ABNORMAL LOW (ref 36.0–46.0)
Hemoglobin: 12.1 g/dL (ref 12.0–15.0)
MCH: 28.9 pg (ref 26.0–34.0)
MCHC: 34.5 g/dL (ref 30.0–36.0)
MCV: 83.8 fL (ref 80.0–100.0)
Platelets: 316 10*3/uL (ref 150–400)
RBC: 4.19 MIL/uL (ref 3.87–5.11)
RDW: 13 % (ref 11.5–15.5)
WBC: 6.5 10*3/uL (ref 4.0–10.5)
nRBC: 0 % (ref 0.0–0.2)

## 2022-03-22 LAB — LIPASE, BLOOD: Lipase: 17 U/L (ref 11–51)

## 2022-03-22 MED ORDER — SODIUM CHLORIDE 0.9 % IV BOLUS
1000.0000 mL | Freq: Once | INTRAVENOUS | Status: AC
  Administered 2022-03-22: 1000 mL via INTRAVENOUS

## 2022-03-22 NOTE — Discharge Instructions (Signed)
It was a pleasure taking care of you today!  Your labs did not show any concerning findings tonight.  Ensure to maintain fluid intake.  You may follow-up with your primary care provider as needed.  Return to the emergency department if you are experiencing increasing/worsening symptoms.

## 2022-03-22 NOTE — ED Notes (Signed)
Pt. Requested having her IV removed d/t pain. Pt. Only had 50cc left in bolus. IV removed and PA notified

## 2022-03-22 NOTE — ED Triage Notes (Signed)
Patient here POV from Home.  Endorses Eating some Chili last PM and since this AM she has been having N/V/D. Associated with some Fatigue as well.   No Known Fevers. No Pain.   NAD noted during Triage. A&Ox4. Gcs 15. Ambulatory.

## 2022-03-22 NOTE — ED Provider Notes (Signed)
[facility] EMERGENCY DEPT Provider Note   CSN: [id number] Arrival date & time: 03/22/22  2025     History  Chief Complaint  Patient presents with   Diarrhea    Wanda Nixon is a 31 y.o. female who presents to the emergency department with concerns for diarrhea onset this morning.  Notes that she had chili last night and since this morning she has had nausea, vomiting, watery nonbloody diarrhea.  Has associated fatigue.  Tried Pepto-Bismol for his symptoms.  Denies abdominal pain, fevers, urinary symptoms.  The history is provided by the patient. No language interpreter was used.       Home Medications Prior to Admission medications   Medication Sig Start Date End Date Taking? Authorizing Provider  ALBUTEROL IN Inhale into the lungs as needed.    [provider]  busPIRone (BUSPAR) 5 MG tablet TAKE 1 TABLET BY MOUTH 3 TIMES DAILY AS NEEDED. 12/27/21   Alcus Dad, MD  citalopram (CELEXA) 40 MG tablet TAKE 1 TABLET BY MOUTH EVERY DAY 03/21/22   Alcus Dad, MD  clonazePAM (KLONOPIN) 0.5 MG tablet Take 1 tablet (0.5 mg total) by mouth 2 (two) times daily as needed for anxiety. 02/22/22   Alcus Dad, MD  propranolol (INDERAL) 10 MG tablet Take as needed 30 minutes before speaking events. Patient not taking: Reported on 03/31/2021 10/24/19   Meccariello, Bernita Raisin, MD  triamcinolone cream (KENALOG) 0.1 % Apply 1 Application topically 2 (two) times daily. 01/14/22   Alcus Dad, MD      Allergies    Tomato, Augmentin [amoxicillin-pot clavulanate], Mushroom extract complex, and Reglan [metoclopramide]    Review of Systems   Review of Systems  Gastrointestinal:  Positive for diarrhea.  All other systems reviewed and are negative.   Physical Exam Updated Vital Signs BP (!) 126/97 (BP Location: Left Arm)   Pulse 81   Temp 97.9 F (36.6 C) (Oral)   Resp 10   Ht '4\' 11"'$  (1.499 m)   Wt 56.8 kg   LMP 02/11/2022   SpO2 100%   BMI 25.29  kg/m  Physical Exam Vitals and nursing note reviewed.  Constitutional:      General: She is not in acute distress.    Appearance: She is not diaphoretic.  HENT:     Head: Normocephalic and atraumatic.     Mouth/Throat:     Pharynx: No oropharyngeal exudate.  Eyes:     General: No scleral icterus.    Conjunctiva/sclera: Conjunctivae normal.  Cardiovascular:     Rate and Rhythm: Normal rate and regular rhythm.     Pulses: Normal pulses.     Heart sounds: Normal heart sounds.  Pulmonary:     Effort: Pulmonary effort is normal. No respiratory distress.     Breath sounds: Normal breath sounds. No wheezing.  Abdominal:     General: Bowel sounds are normal.     Palpations: Abdomen is soft. There is no mass.     Tenderness: There is no abdominal tenderness. There is no guarding or rebound.  Musculoskeletal:        General: Normal range of motion.     Cervical back: Normal range of motion and neck supple.  Skin:    General: Skin is warm and dry.  Neurological:     Mental Status: She is alert.  Psychiatric:        Behavior: Behavior normal.     ED Results / Procedures / Treatments   Labs (all labs ordered  are listed, but only abnormal results are displayed) Labs Reviewed  CBC - Abnormal; Notable for the following components:      Result Value   HCT 35.1 (*)    All other components within normal limits  URINALYSIS, ROUTINE W REFLEX MICROSCOPIC - Abnormal; Notable for the following components:   Specific Gravity, Urine 1.031 (*)    Hgb urine dipstick MODERATE (*)    All other components within normal limits  LIPASE, BLOOD  COMPREHENSIVE METABOLIC PANEL  PREGNANCY, URINE    EKG None  Radiology No results found.  Procedures Procedures    Medications Ordered in ED Medications  sodium chloride 0.9 % bolus 1,000 mL (0 mLs Intravenous Stopped 03/22/22 2216)    ED Course/ Medical Decision Making/ A&P Clinical Course as of 03/22/22 2228  Tue Mar 22, 2022  2227  Discussed with patient lab findings.  Also discussed with patient discharge treatment plan.  Answered all available questions.  Patient appears to be discharged at this time. [SB]    Clinical Course User Index [SB] Anastacio Bua A, PA-C                           Medical Decision Making Amount and/or Complexity of Data Reviewed Labs: ordered.   Patient presents to the emergency department with watery nonbloody diarrhea onset yesterday. On exam patient with no acute cardiovascular, respiratory, abdominal spine findings.  Pt afebrile. Differential diagnosis includes viral etiology, diverticulitis, COVID, flu.  Labs:  I ordered, and personally interpreted labs.  The pertinent results include:  Lipase unremarkable. CMP and CBC without acute findings Negative pregnancy urine  Medications:  I ordered medication including IVF for symptom management.  Notified by RN that patient requested for IV fluids to be stopped secondary to site hurting. I have reviewed the patients home medicines and have made adjustments as needed   Disposition: Presentation suspicious for viral etiology of diarrhea.  Doubt diverticulitis, COVID, flu at this time. After consideration of the diagnostic results and the patients response to treatment, I feel that the patient would benefit from Discharge home.  Supportive care measures and strict return precautions discussed with patient at bedside. Pt acknowledges and verbalizes understanding. Pt appears safe for discharge. Follow up as indicated in discharge paperwork.   This chart was dictated using voice recognition software, Dragon. Despite the best efforts of this provider to proofread and correct errors, errors may still occur which can change documentation meaning.   Final Clinical Impression(s) / ED Diagnoses Final diagnoses:  Diarrhea, unspecified type    Rx / DC Orders ED Discharge Orders     None         Lathen Seal A, PA-C 03/22/22 2228     Regan Lemming, MD 03/22/22 2245

## 2022-04-08 NOTE — Progress Notes (Signed)
Called to discuss that patient has not gotten repeat XR of wrist. Got voicemail. Left HIPAA compliant message. Attempt #1. Will try again

## 2022-04-11 ENCOUNTER — Telehealth: Payer: Self-pay | Admitting: Family Medicine

## 2022-04-11 NOTE — Telephone Encounter (Signed)
Called both patient's mobile number and spouses phone number.  Both immediately went to voicemail.  Left HIPAA compliant message regarding need for new imaging of patient's arm to rule out scaphoid fracture.  Attempt #2.

## 2022-04-12 NOTE — Telephone Encounter (Signed)
Patient's husband returns call to nurse line. DPR on file to speak with husband. Advised of message from provider. He will remind patient of repeat X-ray. She will call back if she has further questions.   Talbot Grumbling, RN

## 2022-04-19 ENCOUNTER — Other Ambulatory Visit: Payer: Self-pay | Admitting: Family Medicine

## 2022-04-19 DIAGNOSIS — F411 Generalized anxiety disorder: Secondary | ICD-10-CM

## 2022-04-28 ENCOUNTER — Ambulatory Visit (INDEPENDENT_AMBULATORY_CARE_PROVIDER_SITE_OTHER): Payer: Self-pay | Admitting: Family Medicine

## 2022-04-28 ENCOUNTER — Encounter: Payer: Self-pay | Admitting: Family Medicine

## 2022-04-28 VITALS — BP 117/88 | HR 77 | Temp 99.0°F | Wt 131.1 lb

## 2022-04-28 DIAGNOSIS — J101 Influenza due to other identified influenza virus with other respiratory manifestations: Secondary | ICD-10-CM

## 2022-04-28 MED ORDER — OXYMETAZOLINE HCL 0.05 % NA SOLN: 1.0000 | Freq: Two times a day (BID) | NASAL | 0 refills | Status: DC

## 2022-04-28 MED ORDER — BENZONATATE 100 MG PO CAPS: 100.0000 mg | ORAL_CAPSULE | Freq: Two times a day (BID) | ORAL | 0 refills | Status: DC | PRN

## 2022-04-28 NOTE — Progress Notes (Signed)
    SUBJECTIVE:   CHIEF COMPLAINT / HPI:  Chief Complaint  Patient presents with   Follow-up    Congestion     Patient was recently seen 1 week ago for telehealth visit diagnosed with influenza B on viral testing.  Treated with oseltamavir. Patient reports she is still having persistent cough and congestion.  She has had difficulty sleeping at night due to her congestion.  Still having poor appetite but drinking fluids okay.  She has had some chest discomfort only when coughing.  She initially had some bodyaches but this has significantly improved.  She has tried Theraflu, Mucinex DM, honey.  Denies SOB.  PERTINENT  PMH / PSH: Allergies, asthma  Patient Care Team: Alcus Dad, MD as PCP - General (Family Medicine) Elesa Massed, NP (Inactive) as Nurse Practitioner (Obstetrics and Gynecology)   OBJECTIVE:   BP 117/88   Pulse 77   Temp 99 F (37.2 C) (Oral)   Wt 131 lb 2 oz (59.5 kg)   LMP 04/15/2022   SpO2 99%   BMI 26.48 kg/m   Physical Exam Constitutional:      General: She is not in acute distress.    Appearance: Normal appearance.  HENT:     Head:     Comments: No frontal or maxillary sinus tenderness    Nose: Congestion present.     Mouth/Throat:     Mouth: Mucous membranes are moist.     Pharynx: Oropharynx is clear. No oropharyngeal exudate or posterior oropharyngeal erythema.  Eyes:     General:        Right eye: No discharge.        Left eye: No discharge.     Extraocular Movements: Extraocular movements intact.     Conjunctiva/sclera: Conjunctivae normal.  Cardiovascular:     Rate and Rhythm: Normal rate and regular rhythm.  Pulmonary:     Effort: Pulmonary effort is normal. No respiratory distress.     Breath sounds: Normal breath sounds.  Musculoskeletal:     Cervical back: Neck supple.  Neurological:     Mental Status: She is alert.         04/28/2022    2:15 PM  Depression screen PHQ 2/9  Decreased Interest 2  Down, Depressed, Hopeless  2  PHQ - 2 Score 4  Altered sleeping 2  Tired, decreased energy 3  Change in appetite 3  Feeling bad or failure about yourself  0  Trouble concentrating 1  Moving slowly or fidgety/restless 0  Suicidal thoughts 0  PHQ-9 Score 13  Difficult doing work/chores Somewhat difficult     {Show previous vital signs (optional):23777}    ASSESSMENT/PLAN:   Influenza B  Symptoms are overall improving but still having persistent cough and congestion.  Low suspicion for superimposed bacterial infection at this time.  Will treat supportively. - supportive care, hydration - continue honey, rx benzonatate prn - Afrin up to 3 days for congestion  Return if symptoms worsen or fail to improve.   Zola Button, MD [place]

## 2022-04-28 NOTE — Patient Instructions (Addendum)
It was nice seeing you today!  Try Afrin for up to 3 days for congestion.  You can take Tessalon as needed for cough. Continue using honey as well.  Stay well, Zola Button, MD [place] [phone]  --  Make sure to check out at the front desk before you leave today.  Please arrive at least 15 minutes prior to your scheduled appointments.  If you had blood work today, I will send you a MyChart message or a letter if results are normal. Otherwise, I will give you a call.  If you had a referral placed, they will call you to set up an appointment. Please give [place] a call if you don't hear back in the next 2 weeks.  If you need additional refills before your next appointment, please call your pharmacy first.

## 2022-05-05 DIAGNOSIS — N809 Endometriosis, unspecified: Secondary | ICD-10-CM | POA: Insufficient documentation

## 2022-05-06 ENCOUNTER — Ambulatory Visit (INDEPENDENT_AMBULATORY_CARE_PROVIDER_SITE_OTHER): Payer: Commercial Managed Care - PPO | Admitting: Student

## 2022-05-06 VITALS — BP 98/80 | HR 85 | Temp 98.6°F | Wt 133.0 lb

## 2022-05-06 DIAGNOSIS — Z20822 Contact with and (suspected) exposure to covid-19: Secondary | ICD-10-CM

## 2022-05-06 DIAGNOSIS — R4589 Other symptoms and signs involving emotional state: Secondary | ICD-10-CM | POA: Diagnosis not present

## 2022-05-06 MED ORDER — ALBUTEROL SULFATE HFA 108 (90 BASE) MCG/ACT IN AERS: 2.0000 | INHALATION_SPRAY | Freq: Four times a day (QID) | RESPIRATORY_TRACT | 2 refills | Status: DC | PRN

## 2022-05-06 NOTE — Patient Instructions (Addendum)
It was great to see you today! Thank you for choosing [facility] for your primary care. Wanda Nixon was seen for follow up.  Today we addressed: -I will update with the COVID results  -Use albuterol every 4-6 hours for breathing   If you haven't already, sign up for My Chart to have easy access to your labs results, and communication with your primary care physician.  I recommend that you always bring your medications to each appointment as this makes it easy to ensure you are on the correct medications and helps [place] not miss refills when you need them. Call the clinic at [phone] if your symptoms worsen or you have any concerns.  You should return to our clinic Return if symptoms worsen or fail to improve. Please arrive 15 minutes before your appointment to ensure smooth check in process.  We appreciate your efforts in making this happen.  Thank you for allowing me to participate in your care, Erskine Emery, MD 05/06/2022, 11:21 AM PGY-2, [place]

## 2022-05-06 NOTE — Progress Notes (Unsigned)
  SUBJECTIVE:   CHIEF COMPLAINT / HPI:   Suspected COVID infection: Patient has body aches at present  Her assistant was sick yesterday and was found to have [place] sluggish and tired today  Subjective fever overnight.  Still coughing, just got over the flu and relates the cough to this  No dyspnea or chest pain Feels very tired Albuterol use regularly while sick, no shortness or breath, congestion, chest pain.   Depressed mood without SI/HI. Since getting sick over the winter season, has felt her mood worsening. Would like to discuss options.    PERTINENT  PMH / PSH: Asthma, GAD, depression    OBJECTIVE:  BP 98/80   Pulse 85   Temp 98.6 F (37 C)   Wt 133 lb (60.3 kg)   LMP 04/15/2022   SpO2 100%   BMI 26.86 kg/m  Physical Exam   General: Alert and oriented in no apparent distress Heart: Regular rate and rhythm with no murmurs appreciated Lungs: CTA bilaterally, no wheezing Abdomen: Bowel sounds present, no abdominal pain Skin: Warm and dry Extremities: No lower extremity edema   ASSESSMENT/PLAN:  Suspected 2019 novel coronavirus infection Assessment & Plan: Continue with symptomatic management Use albuterol every 4-6 hours during the next couple of days while sick  COVID/Flu testing sent, will call with results  Strict return precautions given   Orders: -     COVID-19, Flu A+B and RSV -     Albuterol Sulfate HFA; Inhale 2 puffs into the lungs every 6 (six) hours as needed for wheezing or shortness of breath.  Dispense: 8 g; Refill: 2  Depressed mood Assessment & Plan: Consider adding pharmacologic therapy at low dose and taper up if tolerated, instructed patient to discuss options with pcp  Discussed therapy as an option as well  Patient without SI/HI; denies active plan and reports that they are safe to continue with outpatient treatment.  Close follow up with PCP    Return if symptoms worsen or fail to improve. Erskine Emery, MD 05/08/2022, 12:08  AM PGY-2, [place]

## 2022-05-08 DIAGNOSIS — R4589 Other symptoms and signs involving emotional state: Secondary | ICD-10-CM | POA: Insufficient documentation

## 2022-05-08 DIAGNOSIS — Z20822 Contact with and (suspected) exposure to covid-19: Secondary | ICD-10-CM | POA: Insufficient documentation

## 2022-05-08 LAB — COVID-19, FLU A+B AND RSV
Influenza A, NAA: NOT DETECTED
Influenza B, NAA: NOT DETECTED
RSV, NAA: NOT DETECTED
SARS-CoV-2, NAA: NOT DETECTED

## 2022-05-08 NOTE — Assessment & Plan Note (Signed)
Continue with symptomatic management Use albuterol every 4-6 hours during the next couple of days while sick  COVID/Flu testing sent, will call with results  Strict return precautions given

## 2022-05-08 NOTE — Assessment & Plan Note (Signed)
Consider adding pharmacologic therapy at low dose and taper up if tolerated, instructed patient to discuss options with pcp  Discussed therapy as an option as well  Patient without SI/HI; denies active plan and reports that they are safe to continue with outpatient treatment.  Close follow up with PCP

## 2022-05-11 ENCOUNTER — Ambulatory Visit (INDEPENDENT_AMBULATORY_CARE_PROVIDER_SITE_OTHER): Payer: Commercial Managed Care - PPO | Admitting: Student

## 2022-05-11 ENCOUNTER — Ambulatory Visit: Payer: Commercial Managed Care - PPO

## 2022-05-11 VITALS — BP 104/62 | HR 82 | Ht 59.0 in | Wt 131.8 lb

## 2022-05-11 DIAGNOSIS — B349 Viral infection, unspecified: Secondary | ICD-10-CM | POA: Diagnosis not present

## 2022-05-11 LAB — POC SOFIA 2 FLU + SARS ANTIGEN FIA
Influenza A, POC: NEGATIVE
Influenza B, POC: NEGATIVE
SARS Coronavirus 2 Ag: NEGATIVE

## 2022-05-11 NOTE — Assessment & Plan Note (Signed)
COVID/flu negative, however I still suspect viral cause.  Conservative management discussed.  May return to work after afebrile x 24 hours without use of Tylenol.  Patient complies with advice and states she will take a few days off from work as she has not been doing this even when testing flu positive recently and her body is likely worn down.

## 2022-05-11 NOTE — Progress Notes (Signed)
  SUBJECTIVE:   CHIEF COMPLAINT / HPI:   Pt endorses testing positive for flu 1.5 weeks ago and has been exposed to co-workers with [place]. Experiencing fatigue, body aches, fever, cough, headaches. Tmax 102. She has been taking Theraflu at night, tylenol and ibuprofen. Endorses nausea/vomiting in the last couple weeks and poor appetite.  She started feeling better from flu and then reexperiencing these symptoms.   Flu test positive on 12/20.  Tested negative for COVID/flu/RSV on 05/06/2022.  PERTINENT  PMH / PSH: Not applicable  OBJECTIVE:  BP 104/62   Pulse 82   Ht '4\' 11"'$  (1.499 m)   Wt 131 lb 12.8 oz (59.8 kg)   LMP 04/15/2022   SpO2 96%   BMI 26.62 kg/m  General: Awake, alert, fatigued appearing HEENT: Clear oropharynx, no cervical lymphadenopathy appreciated CV: RRR, no murmurs auscultated Pulm: CTAB, normal WOB, dry cough appreciated Abdomen: Soft, normoactive bowel sounds, nontender in all 4 quadrants Extremities: Cap refill <2 seconds  ASSESSMENT/PLAN:  Viral illness Assessment & Plan: COVID/flu negative, however I still suspect viral cause.  Conservative management discussed.  May return to work after afebrile x 24 hours without use of Tylenol.  Patient complies with advice and states she will take a few days off from work as she has not been doing this even when testing flu positive recently and her body is likely worn down.  Orders: -     POC SOFIA 2 FLU + SARS ANTIGEN FIA  Return if symptoms worsen or fail to improve. Wells Guiles, DO 05/11/2022, 4:48 PM PGY-2, [place]

## 2022-05-11 NOTE — Patient Instructions (Addendum)
It was great to see you today! Thank you for choosing [facility] for your primary care. Wanda Nixon was seen for viral symptoms.  Today we addressed: Much of this sounds like a upper respiratory virus.  Because you are actively fever and, I would not recommend going to work.  Once this is resolved x 24 hours, he may return up to your discretion.  I would recommend symptomatic management at this time and staying hydrated and getting rest.  If you haven't already, sign up for My Chart to have easy access to your labs results, and communication with your primary care physician.  We are checking some labs today. If they are abnormal, I will call you. If they are normal, I will send you a MyChart message (if it is active) or a letter in the mail. If you do not hear about your labs in the next 2 weeks, please call the office. Call the clinic at [phone] if your symptoms worsen or you have any concerns.  You should return to our clinic Return if symptoms worsen or fail to improve. Please arrive 15 minutes before your appointment to ensure smooth check in process.  We appreciate your efforts in making this happen.  Thank you for allowing me to participate in your care, Wells Guiles, DO 05/11/2022, 10:04 AM PGY-2, [place]

## 2022-05-14 ENCOUNTER — Encounter: Payer: Self-pay | Admitting: Student

## 2022-05-18 ENCOUNTER — Encounter: Payer: Self-pay | Admitting: Student

## 2022-05-20 ENCOUNTER — Encounter: Payer: Self-pay | Admitting: Family Medicine

## 2022-05-20 ENCOUNTER — Ambulatory Visit (INDEPENDENT_AMBULATORY_CARE_PROVIDER_SITE_OTHER): Payer: Commercial Managed Care - PPO | Admitting: Family Medicine

## 2022-05-20 ENCOUNTER — Other Ambulatory Visit: Payer: Self-pay

## 2022-05-20 VITALS — BP 133/91 | HR 81 | Wt 135.4 lb

## 2022-05-20 DIAGNOSIS — F411 Generalized anxiety disorder: Secondary | ICD-10-CM | POA: Diagnosis not present

## 2022-05-20 MED ORDER — BUSPIRONE HCL 5 MG PO TABS: 5.0000 mg | ORAL_TABLET | Freq: Every day | ORAL | 0 refills | Status: DC

## 2022-05-20 NOTE — Patient Instructions (Addendum)
It was great to see you!  Continue your Celexa We will add Buspar '5mg'$  to help with your anxiety. Take it once daily in the morning. This can be increased if you find it helpful. Follow up with me in 2-3 weeks. This can be a virtual visit if you prefer. Please get back in with a therapist If there is no improvement with the buspar, we will plan to switch your Celexa to a similar, but slightly different medication called Cymbalta  Take care, Dr Rock Nephew

## 2022-05-20 NOTE — Progress Notes (Signed)
    SUBJECTIVE:   CHIEF COMPLAINT / HPI:   Anxiety -On citalopram 40 mg daily, been on this for ~4-5 years -Recently feels her anxiety is out of control -States she's starting to see a pattern where something stressful happens, feels like she can't cope -Worst when she first wakes up, feels super anxious, knots in her stomach, can't calm down -Feels like her anxiety is starting to trigger some depression now, because she just can't get a good handle on her anxiety and feels bad about this. 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 Hasn't gone for the past ~2 months due to busy schedule. Found it minimally helpful. Therapist just kept asking her what she's feeling and why. She found it frustrating as she doesn't know why she feels this way. Just does.    PERTINENT  PMH / PSH: none  OBJECTIVE:   BP (!) 133/91   Pulse 81   Wt 135 lb 6.4 oz (61.4 kg)   LMP 04/15/2022   SpO2 100%   BMI 27.35 kg/m   General: NAD, pleasant, able to participate in exam Respiratory: No respiratory distress Skin: warm and dry, no rashes noted Psych: well-groomed, appropriate eye contact, anxious, normal thought content, normal speech Neuro: grossly intact  ASSESSMENT/PLAN:   Generalized anxiety disorder Not well-controlled on Celexa '40mg'$  daily. GAD7 score of 19 today. Was advised to try Buspar in the past but was too nervous to try. -Add buspar '5mg'$  once daily. Can titrate up if needed -Continue Celexa -Resume therapy -Return in ~3 weeks -If no improvement with buspar, can try switching from Celexa to [place], [place]

## 2022-05-22 NOTE — Assessment & Plan Note (Signed)
Not well-controlled on Celexa '40mg'$  daily. GAD7 score of 19 today. Was advised to try Buspar in the past but was too nervous to try. -Add buspar '5mg'$  once daily. Can titrate up if needed -Continue Celexa -Resume therapy -Return in ~3 weeks -If no improvement with buspar, can try switching from Celexa to Cymbalta

## 2022-05-27 ENCOUNTER — Ambulatory Visit (INDEPENDENT_AMBULATORY_CARE_PROVIDER_SITE_OTHER): Payer: Commercial Managed Care - PPO | Admitting: Family Medicine

## 2022-05-27 ENCOUNTER — Encounter: Payer: Self-pay | Admitting: Family Medicine

## 2022-05-27 VITALS — BP 112/80 | HR 83 | Ht 59.0 in | Wt 133.6 lb

## 2022-05-27 DIAGNOSIS — R109 Unspecified abdominal pain: Secondary | ICD-10-CM

## 2022-05-27 DIAGNOSIS — R5383 Other fatigue: Secondary | ICD-10-CM

## 2022-05-27 LAB — POCT GLYCOSYLATED HEMOGLOBIN (HGB A1C): Hemoglobin A1C: 5.5 % (ref 4.0–5.6)

## 2022-05-27 LAB — GLUCOSE, POCT (MANUAL RESULT ENTRY): POC Glucose: 85 mg/dl (ref 70–99)

## 2022-05-27 NOTE — Patient Instructions (Signed)
It was wonderful to see you today. Thank you for allowing me to be a part of your care. Below is a short summary of what we discussed at your visit today:  Fatigue Fatigue is a very hard complaint to pinpoint, but I will do my best.  Your physical exam today looked totally normal and I did not see any signs of the big scary things including a blood clot in the lungs, heart failure, or cancer.  Today we will get several labs to check in on things like your red blood cell count, white blood cell count, blood electrolytes, kidney and liver function, thyroid, and some vitamin levels.  The A1c (average blood sugar) and spotcheck blood sugar today were both normal, indicating no issues with diabetes or diabetic complications.  If the results are normal, I will send you a letter or MyChart message. If the results are abnormal, I will give you a call.    Please bring all of your medications to every appointment!  If you have any questions or concerns, please do not hesitate to contact us via phone or MyChart message.   Ezequiel Essex, MD

## 2022-05-27 NOTE — Progress Notes (Unsigned)
SUBJECTIVE:   CHIEF COMPLAINT / HPI:   Fatigue, malaise X2 weeks Symptoms: feeling "sluggish and slow", body aches, fatigue, decreased appetite, morning abdominal pain, nausea, and loose stools Denies fever, chills, congestion, no vomiting, night sweats, weight changes Still drinking, normal UOP No med changes - tried buspar x1 day, didn't like so stopped  Pertinent recent labs: Flu B (+) on 12/20 Flu and COVID testing negative on 1/05 and 1/10 Previous CBC, CMP, lipase on 03/22/2022 unremarkable  Hx endometriosis, had bilateral tubal ligation last year Read surgical report from 09/20/2021 - both tubed definitively removed  No recent travel out of state One kid last sick 3 weeks ago, but different symptoms No sick contacts at work No new water sources, camping, or hiking  PERTINENT  PMH / [place]:  Patient Active Problem List   Diagnosis Date Noted   Suspected 2019 novel coronavirus infection 05/08/2022   Intramural uterine fibroid 05/08/2021   Allergies 09/20/2019   Contraception management 09/20/2019   Other fatigue 03/08/2019   Generalized anxiety disorder 06/21/2018   Eczema 01/24/2018   Pelvic pain 09/12/2017   Asthma 02/26/2016   Migraine 10/25/2013    OBJECTIVE:   BP 112/80   Pulse 83   Ht '4\' 11"'$  (1.499 m)   Wt 133 lb 9.6 oz (60.6 kg)   LMP 05/16/2022   SpO2 100%   BMI 26.98 kg/m    PHQ-9:     05/20/2022    4:30 PM 04/28/2022    2:15 PM 03/15/2022    2:52 PM  Depression screen PHQ 2/9  Decreased Interest '3 2 1  '$ Down, Depressed, Hopeless '3 2 1  '$ PHQ - 2 Score '6 4 2  '$ Altered sleeping '3 2 2  '$ Tired, decreased energy '3 3 2  '$ Change in appetite '3 3 3  '$ Feeling bad or failure about yourself  2 0 2  Trouble concentrating '3 1 2  '$ Moving slowly or fidgety/restless 2 0 0  Suicidal thoughts 0 0 1  PHQ-9 Score '22 13 14  '$ Difficult doing work/chores Extremely dIfficult Somewhat difficult      GAD-7:     05/20/2022    4:30 PM 12/09/2021    3:38 PM 05/07/2021    10:04 AM 05/07/2021   10:03 AM  GAD 7 : Generalized Anxiety Score  Nervous, Anxious, on Edge '3 3  3  '$ Control/stop worrying '3 3  3  '$ Worry too much - different things '3 3  3  '$ Trouble relaxing '3 3  3  '$ Restless '3 3  2  '$ Easily annoyed or irritable '2 3  3  '$ Afraid - awful might happen '2 3  2  '$ Total GAD 7 Score '19 21  19  '$ Anxiety Difficulty Extremely difficult Extremely difficult Very difficult    Physical Exam General: Awake, alert, oriented HEENT: PERRL, bilateral TM pearly pink and flat, bilateral external auditory canals with minimal cerumen burden, no lesions, nasal mucosa slightly edematous, oral mucosa pink, moist, without lesion, intact dentition without obvious cavity Lymph: No palpable lymphedema of head or neck Cardiovascular: Regular rate and rhythm, S1 and S2 present, no murmurs auscultated, no JVD present Respiratory: Lung fields clear to auscultation bilaterally Ext: Trace BLE edema  ASSESSMENT/PLAN:   Other fatigue 2 week duration, unclear etiology. Fatigue carries a wide differential; broadly includes infection, hypothyroidism, anemia, PE, heart failure, and malignancy. Does not appear to be infectious in nature (no fever, sick contacts, prodromal illness, travel, camping/hiking). Will check TSH and CBC. Unlikely PE given normal  SpO2, no SOB, and normal HR. No evidence of heart failure (no JVD, lung crackles, BLE edema). No B symptoms concerning for malignancy. Will obtain labs today to screen for more common causes.      Ezequiel Essex, MD [place]

## 2022-05-28 LAB — CBC
Hematocrit: 35.2 % (ref 34.0–46.6)
Hemoglobin: 12.1 g/dL (ref 11.1–15.9)
MCH: 28.2 pg (ref 26.6–33.0)
MCHC: 34.4 g/dL (ref 31.5–35.7)
MCV: 82 fL (ref 79–97)
Platelets: 316 10*3/uL (ref 150–450)
RBC: 4.29 x10E6/uL (ref 3.77–5.28)
RDW: 13 % (ref 11.7–15.4)
WBC: 2.9 10*3/uL — ABNORMAL LOW (ref 3.4–10.8)

## 2022-05-28 LAB — COMPREHENSIVE METABOLIC PANEL
ALT: 7 IU/L (ref 0–32)
AST: 13 IU/L (ref 0–40)
Albumin/Globulin Ratio: 1.6 (ref 1.2–2.2)
Albumin: 4.5 g/dL (ref 4.0–5.0)
Alkaline Phosphatase: 56 IU/L (ref 44–121)
BUN/Creatinine Ratio: 8 — ABNORMAL LOW (ref 9–23)
BUN: 6 mg/dL (ref 6–20)
Bilirubin Total: 0.4 mg/dL (ref 0.0–1.2)
CO2: 24 mmol/L (ref 20–29)
Calcium: 9.2 mg/dL (ref 8.7–10.2)
Chloride: 101 mmol/L (ref 96–106)
Creatinine, Ser: 0.74 mg/dL (ref 0.57–1.00)
Globulin, Total: 2.9 g/dL (ref 1.5–4.5)
Glucose: 74 mg/dL (ref 70–99)
Potassium: 4.5 mmol/L (ref 3.5–5.2)
Sodium: 140 mmol/L (ref 134–144)
Total Protein: 7.4 g/dL (ref 6.0–8.5)
eGFR: 112 mL/min/{1.73_m2} (ref >59)

## 2022-05-28 LAB — IRON AND TIBC
Iron Saturation: 14 % — ABNORMAL LOW (ref 15–55)
Iron: 47 ug/dL (ref 27–159)
Total Iron Binding Capacity: 338 ug/dL (ref 250–450)
UIBC: 291 ug/dL (ref 131–425)

## 2022-05-28 LAB — VITAMIN B12: Vitamin B-12: 223 pg/mL — ABNORMAL LOW (ref 232–1245)

## 2022-05-28 LAB — TSH: TSH: 1.41 u[IU]/mL (ref 0.450–4.500)

## 2022-05-28 LAB — VITAMIN D 25 HYDROXY (VIT D DEFICIENCY, FRACTURES): Vit D, 25-Hydroxy: 19.8 ng/mL — ABNORMAL LOW (ref 30.0–100.0)

## 2022-05-30 NOTE — Assessment & Plan Note (Signed)
2 week duration, unclear etiology. Fatigue carries a wide differential; broadly includes infection, hypothyroidism, anemia, PE, heart failure, and malignancy. Does not appear to be infectious in nature (no fever, sick contacts, prodromal illness, travel, camping/hiking). Will check TSH and CBC. Unlikely PE given normal SpO2, no SOB, and normal HR. No evidence of heart failure (no JVD, lung crackles, BLE edema). No B symptoms concerning for malignancy. Will obtain labs today to screen for more common causes.

## 2022-06-01 ENCOUNTER — Telehealth: Payer: Self-pay | Admitting: Family Medicine

## 2022-06-01 ENCOUNTER — Other Ambulatory Visit: Payer: Self-pay | Admitting: Family Medicine

## 2022-06-01 DIAGNOSIS — D72819 Decreased white blood cell count, unspecified: Secondary | ICD-10-CM

## 2022-06-01 DIAGNOSIS — E559 Vitamin D deficiency, unspecified: Secondary | ICD-10-CM

## 2022-06-01 MED ORDER — VITAMIN D (ERGOCALCIFEROL) 1.25 MG (50000 UNIT) PO CAPS: 50000.0000 [IU] | ORAL_CAPSULE | ORAL | 0 refills | Status: DC

## 2022-06-01 NOTE — Telephone Encounter (Signed)
Called patient to discuss results.   Vit. B12 and D slightly low. A1c, TSH, iron all okay. CBC shows leukopenia.   Plan  Vit B12: Supplement with foods or OTC. Think fish, milk, dairy products, eggs. Can recheck in 1 month, also okay to not recheck.   Vit D: Can supplement. Patient Recheck in 3 months.   CBC: Come back for blood draw to get peripheral smear, CBC w/ diff, folate, copper, HIV. She elects to get them drawn Monday 2/05 at her next appointment with [place].   Also endorses intermittent sharp chest pain. New onset after last appointment. Lasts only seconds. Denies SOB, pre-syncope/syncope, crushing chest pain, radiation to jaw/shoulder/arm/back. She does have some back and shoulder pain, she thinks maybe she pulled a muscle.   ED precautions given. Patient verbalizes understanding.   Ezequiel Essex, MD

## 2022-06-01 NOTE — Progress Notes (Signed)
Smear add on for leukopenia. Ezequiel Essex, MD

## 2022-06-06 ENCOUNTER — Ambulatory Visit (HOSPITAL_COMMUNITY)
Admission: RE | Admit: 2022-06-06 | Discharge: 2022-06-06 | Disposition: A | Discharge status: Home / Self Care | Payer: Commercial Managed Care - PPO | Source: Ambulatory Visit | Attending: Family Medicine | Admitting: Family Medicine

## 2022-06-06 ENCOUNTER — Encounter: Payer: Self-pay | Admitting: Student

## 2022-06-06 ENCOUNTER — Ambulatory Visit (INDEPENDENT_AMBULATORY_CARE_PROVIDER_SITE_OTHER): Payer: Commercial Managed Care - PPO | Admitting: Student

## 2022-06-06 VITALS — BP 113/88 | HR 91 | Ht 59.0 in | Wt 132.1 lb

## 2022-06-06 DIAGNOSIS — R079 Chest pain, unspecified: Secondary | ICD-10-CM | POA: Insufficient documentation

## 2022-06-06 DIAGNOSIS — F411 Generalized anxiety disorder: Secondary | ICD-10-CM

## 2022-06-06 DIAGNOSIS — R5383 Other fatigue: Secondary | ICD-10-CM

## 2022-06-06 NOTE — Progress Notes (Signed)
SUBJECTIVE:   CHIEF COMPLAINT / HPI:   Anxiety  Fatigue: Symptoms: Reports of chest pain intermittently, endorsed as intermittent, lasting only seconds, sharp in nature.  Did denies dyspnea, syncopal symptoms, radiation. Patient feels like she is dragging everyday, feels "brain fog" and "spacy."  This is affecting her at work, feels foggy.  She notes that she can sleep for any period of time it would not be enough to prevent her from being tired. 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   Psychiatric History - Diagnoses: Anxiety on Citalopram 40 for about 4-5 years.  Recently had BuSpar added to her regimen 5 mg daily 05/23/22--stopped that due to the side effects.  She was instructed last to continue with therapy. - Outpatient therapy: Yes, sees therapy regularly.   Patient was seen for fatigue that is been ongoing since the beginning of January feeling sluggish and slow on 05/27/2022.  At that time, she had a vitamin B12 and D that was slightly low, CBC showed leukopenia.   PHQ-9:     06/06/2022    8:58 AM 05/20/2022    4:30 PM 04/28/2022    2:15 PM  PHQ9 SCORE ONLY  PHQ-9 Total Score '20 22 13    '$ GAD7:     06/06/2022    8:58 AM 05/20/2022    4:30 PM 12/09/2021    3:38 PM 05/07/2021   10:04 AM  GAD 7 : Generalized Anxiety Score  Nervous, Anxious, on Edge '2 3 3   '$ Control/stop worrying '3 3 3   '$ Worry too much - different things '3 3 3   '$ Trouble relaxing '3 3 3   '$ Restless '3 3 3   '$ Easily annoyed or irritable '2 2 3   '$ Afraid - awful might happen '2 2 3   '$ Total GAD 7 Score '18 19 21   '$ Anxiety Difficulty  Extremely difficult Extremely difficult Very difficult     PERTINENT  PMH / PSH:  Asthma Depression GAD History of migraines History of anemia    OBJECTIVE:  BP 113/88   Pulse 91   Ht '4\' 11"'$  (1.499 m)   Wt 132 lb 2 oz (59.9 kg)   LMP 05/16/2022   SpO2 100%   BMI 26.69 kg/m   Physical Exam  General: Alert and oriented in no apparent distress: Intermittently tearful but appropriate affect on exam. Heart: Regular rate and rhythm with no murmurs appreciated Lungs: CTA bilaterally, no wheezing Abdomen: Bowel sounds present, no abdominal pain Skin: Warm and dry Neuro: CN II: PERRL CN III, IV,VI: EOMI CV V: Normal sensation in V1, V2, V3 CVII: Symmetric smile and brow raise CN VIII: Normal hearing CN IX,X: Symmetric palate raise  CN XI: 5/5 shoulder shrug CN XII: Symmetric tongue protrusion  UE and LE strength 5/5 Normal sensation in UE and LE bilaterally      ASSESSMENT/PLAN:  Other fatigue Assessment & Plan: Acute onset of unknown etiology.  Unsure if psychosomatic component.  Will continue with smear for leukopenia.  Continuing with CBC with differential, folate, HIV, copper for further workup of fatigue.  No concerning red flag symptoms on exam, EKG for chest pain unremarkable with normal sinus rhythm.  Will update patient with results and have her follow-up in 1 month.  Orders: -     Pathologist smear review -     CBC with Differential/Platelet -     Folate -  HIV Antibody (routine testing w rflx) -     Ambulatory referral to Psychiatry -     Copper, serum  Generalized anxiety disorder Assessment & Plan: Discontinue BuSpar, continue current dose of citalopram until lab work return.  Consider discontinuing citalopram altogether and continuing with Lexapro as another option for anxiety/depression. Referral to psychiatry for assistance with medications.   Orders: -     Ambulatory referral to Psychiatry  Other orders -     EKG 12-Lead   Return in about 4 weeks (around 07/04/2022) for MOOD .   Erskine Emery, MD 06/06/2022, 10:07 AM PGY-2, [place]

## 2022-06-06 NOTE — Assessment & Plan Note (Addendum)
Discontinue BuSpar, continue current dose of citalopram until lab work return.  Consider discontinuing citalopram altogether and continuing with Lexapro as another option for anxiety/depression. Referral to psychiatry for assistance with medications.

## 2022-06-06 NOTE — Patient Instructions (Addendum)
It was great to see you today! Thank you for choosing [facility] for your primary care. Wanda Nixon was seen for follow up.  Today we addressed: Continue with Citalopram  I will let you know what your results are I have sent you to psychiatry for their input as well    If you haven't already, sign up for My Chart to have easy access to your labs results, and communication with your primary care physician.  I recommend that you always bring your medications to each appointment as this makes it easy to ensure you are on the correct medications and helps [place] not miss refills when you need them. Call the clinic at [phone] if your symptoms worsen or you have any concerns.  You should return to our clinic Return in about 4 weeks (around 07/04/2022) for MOOD . Please arrive 15 minutes before your appointment to ensure smooth check in process.  We appreciate your efforts in making this happen.  Thank you for allowing me to participate in your care, Erskine Emery, MD 06/06/2022, 9:31 AM PGY-2, [place]

## 2022-06-06 NOTE — Assessment & Plan Note (Signed)
Acute onset of unknown etiology.  Unsure if psychosomatic component.  Will continue with smear for leukopenia.  Continuing with CBC with differential, folate, HIV, copper for further workup of fatigue.  No concerning red flag symptoms on exam, EKG for chest pain unremarkable with normal sinus rhythm.  Will update patient with results and have her follow-up in 1 month.

## 2022-06-07 LAB — CBC WITH DIFFERENTIAL/PLATELET
Basophils Absolute: 0 10*3/uL (ref 0.0–0.2)
Basos: 0 %
EOS (ABSOLUTE): 0.1 10*3/uL (ref 0.0–0.4)
Eos: 2 %
Hematocrit: 38.3 % (ref 34.0–46.6)
Hemoglobin: 12.8 g/dL (ref 11.1–15.9)
Immature Grans (Abs): 0 10*3/uL (ref 0.0–0.1)
Immature Granulocytes: 0 %
Lymphocytes Absolute: 0.8 10*3/uL (ref 0.7–3.1)
Lymphs: 22 %
MCH: 27.8 pg (ref 26.6–33.0)
MCHC: 33.4 g/dL (ref 31.5–35.7)
MCV: 83 fL (ref 79–97)
Monocytes Absolute: 0.2 10*3/uL (ref 0.1–0.9)
Monocytes: 6 %
Neutrophils Absolute: 2.7 10*3/uL (ref 1.4–7.0)
Neutrophils: 70 %
Platelets: 325 10*3/uL (ref 150–450)
RBC: 4.6 x10E6/uL (ref 3.77–5.28)
RDW: 13.1 % (ref 11.7–15.4)
WBC: 3.8 10*3/uL (ref 3.4–10.8)

## 2022-06-07 LAB — HIV ANTIBODY (ROUTINE TESTING W REFLEX): HIV Screen 4th Generation wRfx: NONREACTIVE

## 2022-06-07 LAB — FOLATE: Folate: 9.8 ng/mL (ref >3.0)

## 2022-06-09 ENCOUNTER — Encounter: Payer: Self-pay | Admitting: Student

## 2022-06-09 LAB — PATHOLOGIST SMEAR REVIEW
Basophils Absolute: 0 10*3/uL (ref 0.0–0.2)
Basos: 0 %
EOS (ABSOLUTE): 0.1 10*3/uL (ref 0.0–0.4)
Eos: 2 %
Hematocrit: 38.8 % (ref 34.0–46.6)
Hemoglobin: 13 g/dL (ref 11.1–15.9)
Immature Grans (Abs): 0 10*3/uL (ref 0.0–0.1)
Immature Granulocytes: 0 %
Lymphocytes Absolute: 0.9 10*3/uL (ref 0.7–3.1)
Lymphs: 23 %
MCH: 27.9 pg (ref 26.6–33.0)
MCHC: 33.5 g/dL (ref 31.5–35.7)
MCV: 83 fL (ref 79–97)
Monocytes Absolute: 0.2 10*3/uL (ref 0.1–0.9)
Monocytes: 6 %
Neutrophils Absolute: 2.7 10*3/uL (ref 1.4–7.0)
Neutrophils: 69 %
Platelets: 325 10*3/uL (ref 150–450)
RBC: 4.66 x10E6/uL (ref 3.77–5.28)
RDW: 13.2 % (ref 11.7–15.4)
WBC: 3.9 10*3/uL (ref 3.4–10.8)

## 2022-06-14 LAB — COPPER, SERUM: Copper: 130 ug/dL (ref 80–158)

## 2022-06-18 ENCOUNTER — Encounter: Payer: Self-pay | Admitting: Student

## 2022-06-18 DIAGNOSIS — F411 Generalized anxiety disorder: Secondary | ICD-10-CM

## 2022-06-20 NOTE — Telephone Encounter (Signed)
Open in error

## 2022-06-21 MED ORDER — CITALOPRAM HYDROBROMIDE 10 MG PO TABS: 10.0000 mg | ORAL_TABLET | Freq: Every day | ORAL | 0 refills | Status: DC

## 2022-06-22 ENCOUNTER — Encounter: Payer: Self-pay | Admitting: Student

## 2022-06-27 ENCOUNTER — Ambulatory Visit (INDEPENDENT_AMBULATORY_CARE_PROVIDER_SITE_OTHER): Payer: Commercial Managed Care - PPO | Admitting: Student

## 2022-06-27 ENCOUNTER — Encounter: Payer: Self-pay | Admitting: Student

## 2022-06-27 VITALS — BP 114/78 | HR 71 | Ht 59.0 in | Wt 137.0 lb

## 2022-06-27 DIAGNOSIS — R5383 Other fatigue: Secondary | ICD-10-CM | POA: Diagnosis not present

## 2022-06-27 NOTE — Progress Notes (Signed)
    SUBJECTIVE:   CHIEF COMPLAINT / HPI:   Wanda Nixon is a 32  year-old female here to discuss ongoing fatigue.  She was seen on 06/06/2022 for fatigue and had CBC, folate, HIV, copper and serum which were all within normal limits. Referral for psychiatry placed at that time, but there was no answer when called to schedule appointment.  She is so exhausted and it scares her. She says it is not like her to be so tired.  OSA runs in the family. More fatigued in the mornings- it takes a lot for her to get up and go in the morning.  PERTINENT  PMH / PSH: Fatigue, generalized anxiety disorder, Vitamin D deficiency  OBJECTIVE:   BP 114/78   Pulse 71   Wt 137 lb (62.1 kg)   SpO2 99%   BMI 27.67 kg/m      ASSESSMENT/PLAN:   No problem-specific Assessment & Plan notes found for this encounter.   Psychiatrist called to get in.  Orvis Brill, [place]    {    This will disappear when note is signed, click to select method of visit    :1}

## 2022-06-27 NOTE — Patient Instructions (Signed)
It was great seeing you today.  I ordered a sleep study. You will be called to schedule this. 2.  Call to get your psychiatrist appointment scheduled   If you have any questions or concerns, please feel free to call the clinic.   Have a wonderful day,  Dr. Orvis Brill [facility] [phone]

## 2022-06-28 NOTE — Assessment & Plan Note (Signed)
Chronic. This is likely multifactorial which I discussed with the patient.  We discussed mind-body connection and that she may have an underlying component of anxiety and that her antidepressant taper could also be contributing to fatigue. Workup thus far with blood work and EKG are all unremarkable.  Physical exam reassuring. -As she is having some OSA symptoms, I did order a split sleep study to see if this is contributing to her fatigue. -Encouraged regular sleep and wake times.

## 2022-06-30 ENCOUNTER — Ambulatory Visit: Payer: Commercial Managed Care - PPO | Admitting: Family Medicine

## 2022-07-28 ENCOUNTER — Ambulatory Visit: Payer: Commercial Managed Care - PPO

## 2022-08-07 ENCOUNTER — Ambulatory Visit (HOSPITAL_BASED_OUTPATIENT_CLINIC_OR_DEPARTMENT_OTHER): Payer: Commercial Managed Care - PPO | Attending: Family Medicine | Admitting: Internal Medicine

## 2022-08-16 ENCOUNTER — Other Ambulatory Visit: Payer: Self-pay | Admitting: Family Medicine

## 2022-08-22 ENCOUNTER — Ambulatory Visit: Payer: Commercial Managed Care - PPO | Admitting: Family Medicine

## 2022-08-23 ENCOUNTER — Other Ambulatory Visit: Payer: Self-pay | Admitting: Family Medicine

## 2022-08-23 DIAGNOSIS — E559 Vitamin D deficiency, unspecified: Secondary | ICD-10-CM

## 2022-08-26 ENCOUNTER — Other Ambulatory Visit: Payer: Self-pay | Admitting: Family Medicine

## 2022-08-26 DIAGNOSIS — L309 Dermatitis, unspecified: Secondary | ICD-10-CM

## 2022-09-25 ENCOUNTER — Emergency Department (HOSPITAL_BASED_OUTPATIENT_CLINIC_OR_DEPARTMENT_OTHER)
Admission: EM | Admit: 2022-09-25 | Discharge: 2022-09-25 | Disposition: A | Discharge status: Home / Self Care | Payer: Commercial Managed Care - PPO | Attending: Emergency Medicine | Admitting: Emergency Medicine

## 2022-09-25 ENCOUNTER — Other Ambulatory Visit: Payer: Self-pay

## 2022-09-25 ENCOUNTER — Encounter (HOSPITAL_BASED_OUTPATIENT_CLINIC_OR_DEPARTMENT_OTHER): Payer: Self-pay | Admitting: Emergency Medicine

## 2022-09-25 DIAGNOSIS — N809 Endometriosis, unspecified: Secondary | ICD-10-CM | POA: Diagnosis not present

## 2022-09-25 DIAGNOSIS — R102 Pelvic and perineal pain: Secondary | ICD-10-CM | POA: Diagnosis present

## 2022-09-25 DIAGNOSIS — Z760 Encounter for issue of repeat prescription: Secondary | ICD-10-CM | POA: Diagnosis not present

## 2022-09-25 DIAGNOSIS — J45909 Unspecified asthma, uncomplicated: Secondary | ICD-10-CM | POA: Insufficient documentation

## 2022-09-25 LAB — URINALYSIS, ROUTINE W REFLEX MICROSCOPIC
Bilirubin Urine: NEGATIVE
Glucose, UA: NEGATIVE mg/dL
Hgb urine dipstick: NEGATIVE
Ketones, ur: NEGATIVE mg/dL
Nitrite: NEGATIVE
Protein, ur: NEGATIVE mg/dL
Specific Gravity, Urine: 1.027 (ref 1.005–1.030)
pH: 6.5 (ref 5.0–8.0)

## 2022-09-25 LAB — PREGNANCY, URINE: Preg Test, Ur: NEGATIVE

## 2022-09-25 MED ORDER — ACETAMINOPHEN 500 MG PO TABS
1000.0000 mg | ORAL_TABLET | Freq: Once | ORAL | Status: AC
  Administered 2022-09-25: 1000 mg via ORAL
  Filled 2022-09-25: qty 2

## 2022-09-25 MED ORDER — OXYCODONE-ACETAMINOPHEN 5-325 MG PO TABS: 1.0000 | ORAL_TABLET | Freq: Four times a day (QID) | ORAL | 0 refills | Status: DC | PRN

## 2022-09-25 MED ORDER — OXYCODONE HCL 5 MG PO TABS
5.0000 mg | ORAL_TABLET | Freq: Once | ORAL | Status: AC
  Administered 2022-09-25: 5 mg via ORAL
  Filled 2022-09-25: qty 1

## 2022-09-25 MED ORDER — OXYCODONE HCL 5 MG PO TABS: 5.0000 mg | ORAL_TABLET | Freq: Four times a day (QID) | ORAL | 0 refills | Status: DC | PRN

## 2022-09-25 MED ORDER — KETOROLAC TROMETHAMINE 30 MG/ML IJ SOLN
30.0000 mg | Freq: Once | INTRAMUSCULAR | Status: AC
  Administered 2022-09-25: 30 mg via INTRAMUSCULAR
  Filled 2022-09-25: qty 1

## 2022-09-25 MED ORDER — LIDOCAINE 5 % EX PTCH
1.0000 | MEDICATED_PATCH | Freq: Once | CUTANEOUS | Status: DC
  Administered 2022-09-25: 1 via TRANSDERMAL
  Filled 2022-09-25: qty 1

## 2022-09-25 MED ORDER — LIDOCAINE 5 % EX PTCH: 1.0000 | MEDICATED_PATCH | CUTANEOUS | 0 refills | Status: DC

## 2022-09-25 MED ORDER — SENNOSIDES-DOCUSATE SODIUM 8.6-50 MG PO TABS: 1.0000 | ORAL_TABLET | Freq: Every day | ORAL | 0 refills | Status: AC

## 2022-09-25 NOTE — ED Provider Notes (Signed)
[place] EMERGENCY DEPARTMENT AT [facility] Provider Note   CSN: [id number] Arrival date & time: 09/25/22  1808     History  Chief Complaint  Patient presents with   Medication Refill    Wanda Nixon is a 32 y.o. female.  Patient is a 32 year old female with a past medical history of asthma and endometriosis presenting to the emergency department with pelvic pain.  The patient states that her pain started on Thursday and has been gradually worsening throughout the weekend.  She states that this does feel similar to her prior endometriosis flares.  She states that she has been taking ibuprofen and Tylenol without significant relief.  She states that she called her OB/GYN today who recommended that she come to the emergency department for further pain control as her office was closed.  She states that she has been prescribed oxycodone in the past.  She denies any fevers or chills.  She states that she has some mild nausea but no vomiting.  She denies any dysuria or hematuria, diarrhea or constipation.  She states that she has had her tubes tied and surgery related to the endometriosis but denies any other abdominal surgeries.  The history is provided by the patient.  Medication Refill      Home Medications Prior to Admission medications   Medication Sig Start Date End Date Taking? Authorizing Provider  lidocaine (LIDODERM) 5 % Place 1 patch onto the skin daily. Remove & Discard patch within 12 hours or as directed by MD 09/25/22  Yes Theresia Lo, Cecile Sheerer, DO  oxyCODONE (ROXICODONE) 5 MG immediate release tablet Take 1 tablet (5 mg total) by mouth every 6 (six) hours as needed for severe pain. 09/25/22  Yes Theresia Lo, Turkey K, DO  senna-docusate (SENOKOT-S) 8.6-50 MG tablet Take 1 tablet by mouth daily. 09/25/22  Yes Theresia Lo, Benetta Spar K, DO  albuterol (VENTOLIN HFA) 108 (90 Base) MCG/ACT inhaler Inhale 2 puffs into the lungs every 6 (six) hours as needed for wheezing or  shortness of breath. 05/06/22   Alfredo Martinez, MD  citalopram (CELEXA) 10 MG tablet Take 1 tablet (10 mg total) by mouth daily. 06/21/22   Alicia Amel, MD  citalopram (CELEXA) 40 MG tablet TAKE 1 TABLET BY MOUTH EVERY DAY 04/19/22   Maury Dus, MD  clonazePAM (KLONOPIN) 0.5 MG tablet Take 1 tablet (0.5 mg total) by mouth 2 (two) times daily as needed for anxiety. 02/22/22   Maury Dus, MD  triamcinolone cream (KENALOG) 0.1 % APPLY TO AFFECTED AREA TWICE A DAY 08/27/22   Maury Dus, MD  Vitamin D, Ergocalciferol, (DRISDOL) 1.25 MG (50000 UNIT) CAPS capsule TAKE 1 CAPSULE BY MOUTH EVERY 7 DAYS. WHEN YOU RUN OUT OF THESE 12 TABLETS, PLEASE RETURN TO CLINIC FOR VIT. D LEVEL RECHECK. 08/23/22   Maury Dus, MD      Allergies    Tomato, Augmentin [amoxicillin-pot clavulanate], Mushroom extract complex, and Reglan [metoclopramide]    Review of Systems   Review of Systems  Physical Exam Updated Vital Signs BP (!) 123/91   Pulse 90   Temp 99 F (37.2 C)   Resp 20   Wt 59 kg   SpO2 100%   BMI 26.26 kg/m  Physical Exam Vitals and nursing note reviewed.  Constitutional:      General: She is not in acute distress.    Appearance: Normal appearance.  HENT:     Head: Normocephalic and atraumatic.     Nose: Nose normal.  Mouth/Throat:     Mouth: Mucous membranes are moist.     Pharynx: Oropharynx is clear.  Eyes:     Extraocular Movements: Extraocular movements intact.     Conjunctiva/sclera: Conjunctivae normal.  Cardiovascular:     Rate and Rhythm: Normal rate and regular rhythm.     Heart sounds: Normal heart sounds.  Pulmonary:     Effort: Pulmonary effort is normal.     Breath sounds: Normal breath sounds.  Abdominal:     General: Abdomen is flat.     Palpations: Abdomen is soft.     Tenderness: There is abdominal tenderness (Suprapubic). There is no right CVA tenderness, left CVA tenderness, guarding or rebound.  Musculoskeletal:        General: Normal  range of motion.     Cervical back: Normal range of motion.     Comments: No midline back tenderness, bilateral lumbar paraspinal muscle tenderness to palpation  Skin:    General: Skin is warm and dry.  Neurological:     General: No focal deficit present.     Mental Status: She is alert and oriented to person, place, and time.  Psychiatric:        Mood and Affect: Mood normal.        Behavior: Behavior normal.     ED Results / Procedures / Treatments   Labs (all labs ordered are listed, but only abnormal results are displayed) Labs Reviewed  URINALYSIS, ROUTINE W REFLEX MICROSCOPIC - Abnormal; Notable for the following components:      Result Value   Leukocytes,Ua TRACE (*)    Bacteria, UA RARE (*)    All other components within normal limits  PREGNANCY, URINE    EKG None  Radiology No results found.  Procedures Procedures    Medications Ordered in ED Medications  lidocaine (LIDODERM) 5 % 1-3 patch (1 patch Transdermal Patch Applied 09/25/22 2017)  acetaminophen (TYLENOL) tablet 1,000 mg (1,000 mg Oral Given 09/25/22 2016)  ketorolac (TORADOL) 30 MG/ML injection 30 mg (30 mg Intramuscular Given 09/25/22 2018)  oxyCODONE (Oxy IR/ROXICODONE) immediate release tablet 5 mg (5 mg Oral Given 09/25/22 2016)    ED Course/ Medical Decision Making/ A&P Clinical Course as of 09/25/22 2047  Sun Sep 25, 2022  2004 Urine and preg negative. She is stable for discharge with outpatient GYN follow up. She will be given short course of narcotics, PDMP reviewed.  [VK]    Clinical Course User Index [VK] Rexford Maus, DO                             Medical Decision Making This patient presents to the ED with chief complaint(s) of pelvic pain with pertinent past medical history of endometriosis, asthma which further complicates the presenting complaint. The complaint involves an extensive differential diagnosis and also carries with it a high risk of complications and morbidity.     The differential diagnosis includes endometriosis flare, UTI, pregnancy, ectopic, STI unlikely as no abnormal discharge, other intra-abdominal infection unlikely is no significant abdominal tenderness and no fevers  Additional history obtained: Additional history obtained from N/A Records reviewed Care Everywhere/External Records- OBGYN  ED Course and Reassessment: On patient's arrival she is afebrile and hemodynamically stable in no acute distress.  She is minimally tender in the suprapubic region and lumbar paraspinal muscles.  Patient will have a urine performed to evaluate for pregnancy or UTI and she will be given pain  management with Tylenol, Toradol oxycodone and lidocaine patch.  Independent labs interpretation:  The following labs were independently interpreted: Within normal range  Independent visualization of imaging: - N/A  Consultation: - Consulted or discussed management/test interpretation w/ external professional: N/A  Consideration for admission or further workup: Patient has no emergent conditions requiring admission or further work-up at this time and is stable for discharge home with primary care follow-up  Social Determinants of health: N/A    Amount and/or Complexity of Data Reviewed Labs: ordered.  Risk OTC drugs. Prescription drug management.          Final Clinical Impression(s) / ED Diagnoses Final diagnoses:  Endometriosis  Pelvic pain    Rx / DC Orders ED Discharge Orders          Ordered    oxyCODONE (ROXICODONE) 5 MG immediate release tablet  Every 6 hours PRN        09/25/22 2045    lidocaine (LIDODERM) 5 %  Every 24 hours        09/25/22 2045    senna-docusate (SENOKOT-S) 8.6-50 MG tablet  Daily        09/25/22 2046              Rexford Maus, DO 09/25/22 2047

## 2022-09-25 NOTE — ED Triage Notes (Signed)
Hx endometriosis. Pt is having an episode, extreme pelvic/back pain. OTC ibuprofen/tyel not helping. Patient usually takes oxycodone in the past and she has run out.

## 2022-09-25 NOTE — ED Notes (Signed)
Pt here for request for pain medication refill.

## 2022-09-25 NOTE — Discharge Instructions (Addendum)
Seen in the emergency department for your pelvic pain.  Your urine here was normal and this is likely related to your endometriosis.  You can take 800 mg of ibuprofen every 8 hours and 1 g of Tylenol every 8 hours as needed for pain.  I have given you some oxycodone for breakthrough pain.  This can make you drowsy so do not take it before driving, working, operating heavy machinery, or watching small children alone.  It can also make you constipated so you should take a stool softener while you are on the narcotics.  You can also use lidocaine patches or heat packs.  You should follow-up with your GYN within the next few days to have your symptoms rechecked and for further pain control.  You should return to the emergency department if you are having fevers, repetitive vomiting or any other new or concerning symptoms.

## 2022-09-25 NOTE — ED Triage Notes (Signed)
Pt asking for pain medication til her md can see her next week.

## 2022-10-21 ENCOUNTER — Other Ambulatory Visit: Payer: Self-pay

## 2022-10-21 ENCOUNTER — Encounter (HOSPITAL_BASED_OUTPATIENT_CLINIC_OR_DEPARTMENT_OTHER): Payer: Self-pay | Admitting: Emergency Medicine

## 2022-10-21 DIAGNOSIS — R1033 Periumbilical pain: Secondary | ICD-10-CM | POA: Diagnosis not present

## 2022-10-21 DIAGNOSIS — R102 Pelvic and perineal pain: Secondary | ICD-10-CM | POA: Diagnosis present

## 2022-10-21 NOTE — ED Triage Notes (Signed)
Pt presents to ED Pov. Pt c/o pelvic pain x3d. Pt reports that she is having an endometriosis flare up. Pt reports her pain is so sever that she has been n/v

## 2022-10-22 ENCOUNTER — Emergency Department (HOSPITAL_BASED_OUTPATIENT_CLINIC_OR_DEPARTMENT_OTHER)
Admission: EM | Admit: 2022-10-22 | Discharge: 2022-10-22 | Disposition: A | Discharge status: Home / Self Care | Payer: Commercial Managed Care - PPO | Attending: Emergency Medicine | Admitting: Emergency Medicine

## 2022-10-22 DIAGNOSIS — R1033 Periumbilical pain: Secondary | ICD-10-CM

## 2022-10-22 LAB — URINALYSIS, ROUTINE W REFLEX MICROSCOPIC
Bilirubin Urine: NEGATIVE
Glucose, UA: NEGATIVE mg/dL
Hgb urine dipstick: NEGATIVE
Ketones, ur: NEGATIVE mg/dL
Leukocytes,Ua: NEGATIVE
Nitrite: NEGATIVE
Specific Gravity, Urine: 1.034 — ABNORMAL HIGH (ref 1.005–1.030)
pH: 5.5 (ref 5.0–8.0)

## 2022-10-22 LAB — PREGNANCY, URINE: Preg Test, Ur: NEGATIVE

## 2022-10-22 MED ORDER — KETOROLAC TROMETHAMINE 60 MG/2ML IM SOLN
30.0000 mg | Freq: Once | INTRAMUSCULAR | Status: AC
  Administered 2022-10-22: 30 mg via INTRAMUSCULAR
  Filled 2022-10-22: qty 2

## 2022-10-22 MED ORDER — ONDANSETRON 4 MG PO TBDP
8.0000 mg | ORAL_TABLET | Freq: Once | ORAL | Status: AC
  Administered 2022-10-22: 8 mg via ORAL
  Filled 2022-10-22: qty 2

## 2022-10-22 MED ORDER — OXYCODONE HCL 5 MG PO TABS: 5.0000 mg | ORAL_TABLET | ORAL | 0 refills | Status: DC | PRN

## 2022-10-22 MED ORDER — IBUPROFEN 800 MG PO TABS: 800.0000 mg | ORAL_TABLET | Freq: Three times a day (TID) | ORAL | 0 refills | Status: AC

## 2022-10-22 MED ORDER — ONDANSETRON 4 MG PO TBDP: ORAL_TABLET | ORAL | 0 refills | Status: DC

## 2022-10-22 MED ORDER — OXYCODONE HCL 5 MG PO TABS
10.0000 mg | ORAL_TABLET | Freq: Once | ORAL | Status: AC
  Administered 2022-10-22: 10 mg via ORAL
  Filled 2022-10-22: qty 2

## 2022-10-22 NOTE — ED Provider Notes (Signed)
[place] EMERGENCY DEPARTMENT AT [facility] Provider Note   CSN: [id number] Arrival date & time: 10/21/22  2317     History  Chief Complaint  Patient presents with   Pelvic Pain    Wanda Nixon is a 32 y.o. female.  32 year old female who presents ER today secondary to pelvic pain.  States it feels just like her endometriosis which has been dealing with for few years now.  She actually had oophorectomy previously but still has symptoms.  She has a appointment with her gynecologist coming up to discuss other options.  She states the last 3 days she has had this pain again.  No vaginal discharge or dysuria.  No other symptoms.  Tylenol and ibuprofen at home did not help.  Does have some abdominal cramping as well.  Her period is due to start this week.   Pelvic Pain       Home Medications Prior to Admission medications   Medication Sig Start Date End Date Taking? Authorizing Provider  ibuprofen (ADVIL) 800 MG tablet Take 1 tablet (800 mg total) by mouth 3 (three) times daily. 10/22/22  Yes Macklen Wilhoite, Barbara Cower, MD  ondansetron (ZOFRAN-ODT) 4 MG disintegrating tablet 4mg  ODT q4 hours prn nausea/vomit 10/22/22  Yes Zackarie Chason, Barbara Cower, MD  oxyCODONE (ROXICODONE) 5 MG immediate release tablet Take 1 tablet (5 mg total) by mouth every 4 (four) hours as needed for breakthrough pain. 10/22/22  Yes Mason Dibiasio, Barbara Cower, MD  albuterol (VENTOLIN HFA) 108 (90 Base) MCG/ACT inhaler Inhale 2 puffs into the lungs every 6 (six) hours as needed for wheezing or shortness of breath. 05/06/22   Alfredo Martinez, MD  citalopram (CELEXA) 10 MG tablet Take 1 tablet (10 mg total) by mouth daily. 06/21/22   Alicia Amel, MD  citalopram (CELEXA) 40 MG tablet TAKE 1 TABLET BY MOUTH EVERY DAY 04/19/22   Maury Dus, MD  clonazePAM (KLONOPIN) 0.5 MG tablet Take 1 tablet (0.5 mg total) by mouth 2 (two) times daily as needed for anxiety. 02/22/22   Maury Dus, MD  lidocaine (LIDODERM) 5 % Place 1 patch onto  the skin daily. Remove & Discard patch within 12 hours or as directed by MD 09/25/22   Elayne Snare K, DO  senna-docusate (SENOKOT-S) 8.6-50 MG tablet Take 1 tablet by mouth daily. 09/25/22   Elayne Snare K, DO  triamcinolone cream (KENALOG) 0.1 % APPLY TO AFFECTED AREA TWICE A DAY 08/27/22   Maury Dus, MD  Vitamin D, Ergocalciferol, (DRISDOL) 1.25 MG (50000 UNIT) CAPS capsule TAKE 1 CAPSULE BY MOUTH EVERY 7 DAYS. WHEN YOU RUN OUT OF THESE 12 TABLETS, PLEASE RETURN TO CLINIC FOR VIT. D LEVEL RECHECK. 08/23/22   Maury Dus, MD      Allergies    Tomato, Augmentin [amoxicillin-pot clavulanate], Mushroom extract complex, and Reglan [metoclopramide]    Review of Systems   Review of Systems  Genitourinary:  Positive for pelvic pain.    Physical Exam Updated Vital Signs BP (!) 132/93   Pulse 91   Temp 98.3 F (36.8 C) (Oral)   Resp 18   SpO2 100%  Physical Exam Vitals and nursing note reviewed.  Constitutional:      Appearance: She is well-developed.  HENT:     Head: Normocephalic and atraumatic.  Eyes:     Pupils: Pupils are equal, round, and reactive to light.  Cardiovascular:     Rate and Rhythm: Normal rate and regular rhythm.  Pulmonary:     Effort: No respiratory distress.  Breath sounds: No stridor.  Abdominal:     General: Abdomen is flat. There is no distension.  Musculoskeletal:        General: No swelling or tenderness. Normal range of motion.     Cervical back: Normal range of motion.  Skin:    General: Skin is warm and dry.  Neurological:     General: No focal deficit present.     Mental Status: She is alert.     ED Results / Procedures / Treatments   Labs (all labs ordered are listed, but only abnormal results are displayed) Labs Reviewed  URINALYSIS, ROUTINE W REFLEX MICROSCOPIC - Abnormal; Notable for the following components:      Result Value   Specific Gravity, Urine 1.034 (*)    Protein, ur TRACE (*)    All other components  within normal limits  PREGNANCY, URINE    EKG None  Radiology No results found.  Procedures Procedures    Medications Ordered in ED Medications  oxyCODONE (Oxy IR/ROXICODONE) immediate release tablet 10 mg (10 mg Oral Given 10/22/22 0435)  ketorolac (TORADOL) injection 30 mg (30 mg Intramuscular Given 10/22/22 0435)  ondansetron (ZOFRAN-ODT) disintegrating tablet 8 mg (8 mg Oral Given 10/22/22 0435)    ED Course/ Medical Decision Making/ A&P                             Medical Decision Making Amount and/or Complexity of Data Reviewed Labs: ordered.  Risk Prescription drug management.   Treated for abdominal pain.  She seems reliable and her story is consistent with information to find in the computer so went and gave her prescription for a few more oxycodone which has been on in the past until she can follow-up with her gynecologist this week.   Final Clinical Impression(s) / ED Diagnoses Final diagnoses:  Periumbilical abdominal pain    Rx / DC Orders ED Discharge Orders          Ordered    ondansetron (ZOFRAN-ODT) 4 MG disintegrating tablet        10/22/22 0439    oxyCODONE (ROXICODONE) 5 MG immediate release tablet  Every 4 hours PRN        10/22/22 0439    ibuprofen (ADVIL) 800 MG tablet  3 times daily        10/22/22 0439              Helia Haese, Barbara Cower, MD 10/22/22 [id number]

## 2022-10-22 NOTE — ED Notes (Signed)
Pt out of room to nurses station. Pt upset about waiting for doctor reports she is in a lot of pain. EDP notified.

## 2022-10-22 NOTE — ED Notes (Signed)
Pt called out, This RN into room. Pt asking what she is waiting for. RN explained delay to pt.

## 2022-11-09 ENCOUNTER — Telehealth: Payer: Self-pay | Admitting: Student

## 2022-11-09 NOTE — Progress Notes (Signed)
SUBJECTIVE:   CHIEF COMPLAINT / HPI: Anxiety  FMTS After Hours Line Phone Call Received after hours phone from patient. Called patient and confirmed name and DOB.  Patient reports severe anxiety. She recently separated from her husband and is dealing with panic attacks. She denies thoughts of hurting herself or being better off dead. She reports good compliance to her home Celexa. She has been prescribed a few pills of Klonopin in the past for her anxiety. Explained that we do not prescribe controlled substances over the phone and she will need to be seen. Scheduled appointment with Access to care tomorrow at 10:50 AM to discuss her anxiety. Offered to send in Atarax to her pharmacy but she would prefer to be seen before prescribing any medication.  We discussed ED precautions. I am routing this note to the PCP and physician seeing patient in clinic next, as appropriate. She reports she has a good support system and has the number to behavioral health if needed.   Today she states that she and her husband are divorcing.  They have been together for 10 years.  She states that they decided on separation on Tuesday.  She has been having significant anxiety since then.  She has generalized anxiety disorder at baseline but with a significant life event she has been on the verge of panic attack daily.  She has been using her methods taught by her therapist and has been continuing her Celexa however this has not been enough.  Today is the first day that she has been able to talk about the situation without crying.  She states that in the past what is helpful for her has been clonazepam and Ativan.  She states that she does not want to be on this long-term however she needs help being able to function at work and carry on conversations.  She denies any thoughts of hurting herself or others.  She has physical symptoms of palpitations, intermittent shortness of breath and sweating during times of  anxiety.     11/10/2022   11:00 AM 06/27/2022    2:43 PM 06/06/2022    8:58 AM 05/20/2022    4:30 PM 04/28/2022    2:15 PM  Depression screen PHQ 2/9  Decreased Interest 2 1 3 3 2   Down, Depressed, Hopeless 2 1 1 3 2   PHQ - 2 Score 4 2 4 6 4   Altered sleeping 3 3 3 3 2   Tired, decreased energy 2 3 3 3 3   Change in appetite 1 3 3 3 3   Feeling bad or failure about yourself  0 0 2 2 0  Trouble concentrating 3 2 3 3 1   Moving slowly or fidgety/restless 0 0 2 2 0  Suicidal thoughts 0 0 0 0 0  PHQ-9 Score 13 13 20 22 13   Difficult doing work/chores  Extremely dIfficult  Extremely dIfficult Somewhat difficult       11/10/2022   11:04 AM 11/10/2022   11:00 AM 06/06/2022    8:58 AM 05/20/2022    4:30 PM  GAD 7 : Generalized Anxiety Score  Nervous, Anxious, on Edge 3 3 2 3   Control/stop worrying 3 3 3 3   Worry too much - different things 3 3 3 3   Trouble relaxing 3 3 3 3   Restless 3 3 3 3   Easily annoyed or irritable 2 2 2 2   Afraid - awful might happen 2 2 2 2   Total GAD 7 Score 19 19 18  19  Anxiety Difficulty  Very difficult  Extremely difficult      PERTINENT  PMH / PSH: GAD  OBJECTIVE:   BP (!) 123/95   Pulse 85   Ht 4\' 11"  (1.499 m)   Wt 139 lb 12.8 oz (63.4 kg)   LMP 10/25/2022   SpO2 100%   BMI 28.24 kg/m   General: Anxious appearing, alert and responsive to all questions Head: Normocephalic atraumatic Psych: Affect anxious, does not appear to be responding to any internal stimuli CV: Regular rate and rhythm no murmurs rubs or gallops Respiratory: Clear to ausculation bilaterally, no wheezes rales or crackles, chest rises symmetrically,  no increased work of breathing  ASSESSMENT/PLAN:   Generalized anxiety disorder Patient going through a significant life event of divorcing from her husband of 10 years.  We discussed options of increasing her Celexa back up to 40 mg.  She does not want to do this as she would ultimately want to be weaned off of this medication at  some point.  Success with short course of as needed benzodiazepine for significant life events in the past.  I do feel like it is appropriate to give this to her for a short course.  She does have a recent oxycodone prescription for endometriosis which I discussed with her to not take alongside this benzodiazepine.  She states that she is not taking oxycodone and I have removed this from her medication list. Therapeutic hug given. -Continue Celexa 20 mg daily -Therapy appointment scheduled for tomorrow -20 pills of clonazepam 0.5 mg twice daily as needed -Close PCP follow-up -Discussed mental health emergencies and when to return to care  Levin Erp, MD [facility]

## 2022-11-09 NOTE — Telephone Encounter (Signed)
FMTS After Hours Line Phone Call Received after hours phone from patient. Called patient and confirmed name and DOB.   Patient reports severe anxiety. She recently separated from her husband and is dealing with panic attacks. She denies thoughts of hurting herself or being better off dead. She reports good compliance to her home Celexa. She has been prescribed a few pills of Klonopin in the past for her anxiety.  Explained that we do not prescribe controlled substances over the phone and she will need to be seen. Scheduled appointment with Access to care tomorrow at 10:50 AM to discuss her anxiety. Offered to send in Atarax to her pharmacy but she would prefer to be seen before prescribing any medication.   We discussed ED precautions. I am routing this note to the PCP and physician seeing patient in clinic next, as appropriate. She reports she has a good support system and has the number to behavioral health if needed.   Glendale Chard, DO [facility], PGY-2 11/09/22 1:26 PM

## 2022-11-10 ENCOUNTER — Ambulatory Visit (INDEPENDENT_AMBULATORY_CARE_PROVIDER_SITE_OTHER): Payer: Commercial Managed Care - PPO | Admitting: Student

## 2022-11-10 DIAGNOSIS — F418 Other specified anxiety disorders: Secondary | ICD-10-CM | POA: Diagnosis not present

## 2022-11-10 DIAGNOSIS — F411 Generalized anxiety disorder: Secondary | ICD-10-CM | POA: Diagnosis not present

## 2022-11-10 MED ORDER — CLONAZEPAM 0.5 MG PO TABS
0.5000 mg | ORAL_TABLET | Freq: Two times a day (BID) | ORAL | 0 refills | Status: DC | PRN
Start: 2022-11-10 — End: 2024-02-09

## 2022-11-10 NOTE — Assessment & Plan Note (Addendum)
Patient going through a significant life event of divorcing from her husband of 10 years.  We discussed options of increasing her Celexa back up to 40 mg.  She does not want to do this as she would ultimately want to be weaned off of this medication at some point.  Success with short course of as needed benzodiazepine for significant life events in the past.  I do feel like it is appropriate to give this to her for a short course.  She does have a recent oxycodone prescription for endometriosis which I discussed with her to not take alongside this benzodiazepine.  She states that she is not taking oxycodone and I have removed this from her medication list. Therapeutic hug given. -Continue Celexa 20 mg daily -Therapy appointment scheduled for tomorrow -20 pills of clonazepam 0.5 mg twice daily as needed -Close PCP follow-up -Discussed mental health emergencies and when to return to care

## 2022-11-10 NOTE — Patient Instructions (Signed)
It was great to see you! Thank you for allowing me to participate in your care!   I recommend that you always bring your medications to each appointment as this makes it easy to ensure we are on the correct medications and helps [place] not miss when refills are needed.  Our plans for today:  - I am sending you in klonopin to use as needed for next couple of weeks-please do not use oxycodone with this - Continue celexa - Let us know if needing anything!  Take care and seek immediate care sooner if you develop any concerns. Please remember to show up 15 minutes before your scheduled appointment time!  Levin Erp, MD [facility]

## 2022-12-15 ENCOUNTER — Other Ambulatory Visit: Payer: Self-pay

## 2022-12-23 ENCOUNTER — Ambulatory Visit: Payer: Commercial Managed Care - PPO | Admitting: Family Medicine

## 2023-01-23 ENCOUNTER — Other Ambulatory Visit: Payer: Self-pay

## 2023-01-23 ENCOUNTER — Emergency Department (HOSPITAL_BASED_OUTPATIENT_CLINIC_OR_DEPARTMENT_OTHER)
Admission: EM | Admit: 2023-01-23 | Discharge: 2023-01-23 | Disposition: A | Discharge status: Home / Self Care | Payer: Commercial Managed Care - PPO | Attending: Emergency Medicine | Admitting: Emergency Medicine

## 2023-01-23 ENCOUNTER — Encounter (HOSPITAL_BASED_OUTPATIENT_CLINIC_OR_DEPARTMENT_OTHER): Payer: Self-pay | Admitting: Pediatrics

## 2023-01-23 DIAGNOSIS — R55 Syncope and collapse: Secondary | ICD-10-CM | POA: Diagnosis present

## 2023-01-23 DIAGNOSIS — Z1152 Encounter for screening for COVID-19: Secondary | ICD-10-CM | POA: Insufficient documentation

## 2023-01-23 DIAGNOSIS — J45909 Unspecified asthma, uncomplicated: Secondary | ICD-10-CM | POA: Diagnosis not present

## 2023-01-23 DIAGNOSIS — R42 Dizziness and giddiness: Secondary | ICD-10-CM

## 2023-01-23 LAB — RESP PANEL BY RT-PCR (RSV, FLU A&B, COVID)  RVPGX2
Influenza A by PCR: NEGATIVE
Influenza B by PCR: NEGATIVE
Resp Syncytial Virus by PCR: NEGATIVE
SARS Coronavirus 2 by RT PCR: NEGATIVE

## 2023-01-23 LAB — BASIC METABOLIC PANEL
Anion gap: 10 (ref 5–15)
BUN: 11 mg/dL (ref 6–20)
CO2: 25 mmol/L (ref 22–32)
Calcium: 9.6 mg/dL (ref 8.9–10.3)
Chloride: 105 mmol/L (ref 98–111)
Creatinine, Ser: 0.73 mg/dL (ref 0.44–1.00)
GFR, Estimated: >60 mL/min (ref >60)
Glucose, Bld: 82 mg/dL (ref 70–99)
Potassium: 3.6 mmol/L (ref 3.5–5.1)
Sodium: 140 mmol/L (ref 135–145)

## 2023-01-23 LAB — CBC
HCT: 37.6 % (ref 36.0–46.0)
Hemoglobin: 12.9 g/dL (ref 12.0–15.0)
MCH: 28.7 pg (ref 26.0–34.0)
MCHC: 34.3 g/dL (ref 30.0–36.0)
MCV: 83.6 fL (ref 80.0–100.0)
Platelets: 314 10*3/uL (ref 150–400)
RBC: 4.5 MIL/uL (ref 3.87–5.11)
RDW: 13.1 % (ref 11.5–15.5)
WBC: 5.3 10*3/uL (ref 4.0–10.5)
nRBC: 0 % (ref 0.0–0.2)

## 2023-01-23 LAB — PREGNANCY, URINE: Preg Test, Ur: NEGATIVE

## 2023-01-23 LAB — URINALYSIS, ROUTINE W REFLEX MICROSCOPIC
Bilirubin Urine: NEGATIVE
Glucose, UA: NEGATIVE mg/dL
Hgb urine dipstick: NEGATIVE
Ketones, ur: NEGATIVE mg/dL
Leukocytes,Ua: NEGATIVE
Nitrite: NEGATIVE
Specific Gravity, Urine: 1.026 (ref 1.005–1.030)
pH: 7 (ref 5.0–8.0)

## 2023-01-23 LAB — CBG MONITORING, ED: Glucose-Capillary: 71 mg/dL (ref 70–99)

## 2023-01-23 MED ORDER — KETOROLAC TROMETHAMINE 30 MG/ML IJ SOLN
30.0000 mg | Freq: Once | INTRAMUSCULAR | Status: AC
  Administered 2023-01-23: 30 mg via INTRAVENOUS
  Filled 2023-01-23: qty 1

## 2023-01-23 MED ORDER — SODIUM CHLORIDE 0.9 % IV BOLUS
1000.0000 mL | Freq: Once | INTRAVENOUS | Status: AC
  Administered 2023-01-23: 1000 mL via INTRAVENOUS

## 2023-01-23 MED ORDER — MECLIZINE HCL 25 MG PO TABS
25.0000 mg | ORAL_TABLET | Freq: Once | ORAL | Status: AC
  Administered 2023-01-23: 25 mg via ORAL
  Filled 2023-01-23: qty 1

## 2023-01-23 MED ORDER — OXYCODONE-ACETAMINOPHEN 5-325 MG PO TABS
1.0000 | ORAL_TABLET | Freq: Once | ORAL | Status: AC
  Administered 2023-01-23: 1 via ORAL
  Filled 2023-01-23: qty 1

## 2023-01-23 MED ORDER — ONDANSETRON HCL 4 MG/2ML IJ SOLN
4.0000 mg | Freq: Once | INTRAMUSCULAR | Status: AC
  Administered 2023-01-23: 4 mg via INTRAVENOUS
  Filled 2023-01-23: qty 2

## 2023-01-23 MED ORDER — MECLIZINE HCL 25 MG PO TABS: 25.0000 mg | ORAL_TABLET | Freq: Three times a day (TID) | ORAL | 0 refills | Status: AC | PRN

## 2023-01-23 NOTE — ED Provider Notes (Signed)
[place] EMERGENCY DEPARTMENT AT [facility] Provider Note   CSN: [id number] Arrival date & time: 01/23/23  1021     History  Chief Complaint  Patient presents with   Dizziness    Wanda Nixon is a 32 y.o. female with past medical history significant for GAD, asthma, depression, migraines, anemia presents to the ED complaining of lightheadedness and dizziness since Friday.  Patient reports she has had the symptoms all weekend, but has had an increase of stress in her life.  Patient reports she has not been eating, drinking, or sleeping as much.  Patient did have some alcoholic beverages over the weekend which did make her symptoms worse.  Patient believes that she did have a syncopal episode this weekend as she woke up on the floor next to her bedroom closet and her body has been overall sore.  Patient reports that her dizziness/lightheadedness is worse with changing positions especially upon standing.  She has associated nausea.  Denies fever, chills, headache, chest pain, shortness of breath, vomiting, diarrhea.        Home Medications Prior to Admission medications   Medication Sig Start Date End Date Taking? Authorizing Provider  meclizine (ANTIVERT) 25 MG tablet Take 1 tablet (25 mg total) by mouth 3 (three) times daily as needed. 01/23/23  Yes Erlean Mealor R, PA-C  albuterol (VENTOLIN HFA) 108 (90 Base) MCG/ACT inhaler Inhale 2 puffs into the lungs every 6 (six) hours as needed for wheezing or shortness of breath. 05/06/22   Alfredo Martinez, MD  citalopram (CELEXA) 10 MG tablet Take 1 tablet (10 mg total) by mouth daily. 06/21/22   Alicia Amel, MD  citalopram (CELEXA) 40 MG tablet TAKE 1 TABLET BY MOUTH EVERY DAY 04/19/22   Maury Dus, MD  clonazePAM (KLONOPIN) 0.5 MG tablet Take 1 tablet (0.5 mg total) by mouth 2 (two) times daily as needed for anxiety. 11/10/22   Levin Erp, MD  ibuprofen (ADVIL) 800 MG tablet Take 1 tablet (800 mg total) by mouth 3  (three) times daily. 10/22/22   Mesner, Barbara Cower, MD  lidocaine (LIDODERM) 5 % Place 1 patch onto the skin daily. Remove & Discard patch within 12 hours or as directed by MD 09/25/22   Elayne Snare K, DO  ondansetron (ZOFRAN-ODT) 4 MG disintegrating tablet 4mg  ODT q4 hours prn nausea/vomit 10/22/22   Mesner, Barbara Cower, MD  senna-docusate (SENOKOT-S) 8.6-50 MG tablet Take 1 tablet by mouth daily. 09/25/22   Elayne Snare K, DO  triamcinolone cream (KENALOG) 0.1 % APPLY TO AFFECTED AREA TWICE A DAY 08/27/22   Maury Dus, MD  Vitamin D, Ergocalciferol, (DRISDOL) 1.25 MG (50000 UNIT) CAPS capsule TAKE 1 CAPSULE BY MOUTH EVERY 7 DAYS. WHEN YOU RUN OUT OF THESE 12 TABLETS, PLEASE RETURN TO CLINIC FOR VIT. D LEVEL RECHECK. 08/23/22   Maury Dus, MD      Allergies    Tomato, Augmentin [amoxicillin-pot clavulanate], Mushroom extract complex, and Reglan [metoclopramide]    Review of Systems   Review of Systems  Constitutional:  Negative for chills and fever.  Respiratory:  Negative for shortness of breath.   Cardiovascular:  Negative for chest pain.  Gastrointestinal:  Positive for nausea. Negative for abdominal pain, diarrhea and vomiting.  Musculoskeletal:  Positive for myalgias.  Neurological:  Positive for dizziness, syncope and light-headedness. Negative for headaches.    Physical Exam Updated Vital Signs BP 120/87   Pulse 90   Temp 98.3 F (36.8 C)   Resp (!) 24  Ht 4\' 11"  (1.499 m)   Wt 56.2 kg   LMP 01/19/2023   SpO2 99%   BMI 25.04 kg/m  Physical Exam Vitals and nursing note reviewed.  Constitutional:      General: She is not in acute distress.    Appearance: Normal appearance. She is not ill-appearing or diaphoretic.  HENT:     Head: Normocephalic and atraumatic.     Right Ear: Tympanic membrane and ear canal normal.     Left Ear: Tympanic membrane and ear canal normal.  Eyes:     General: Lids are normal. Vision grossly intact.     Extraocular Movements:  Extraocular movements intact.     Conjunctiva/sclera: Conjunctivae normal.     Comments: No observable nystagmus with EOM, patient does report subjective increase in dizziness when looking to the left.  Cardiovascular:     Rate and Rhythm: Normal rate and regular rhythm.  Pulmonary:     Effort: Pulmonary effort is normal.  Abdominal:     General: Abdomen is flat.     Palpations: Abdomen is soft.     Tenderness: There is no abdominal tenderness.  Musculoskeletal:     Cervical back: Full passive range of motion without pain.  Skin:    General: Skin is warm and dry.     Capillary Refill: Capillary refill takes less than 2 seconds.  Neurological:     General: No focal deficit present.     Mental Status: She is alert. Mental status is at baseline.     GCS: GCS eye subscore is 4. GCS verbal subscore is 5. GCS motor subscore is 6.     Cranial Nerves: No cranial nerve deficit.     Sensory: No sensory deficit.     Motor: No weakness or abnormal muscle tone.     Coordination: Coordination is intact.  Psychiatric:        Mood and Affect: Mood normal.        Behavior: Behavior normal.     ED Results / Procedures / Treatments   Labs (all labs ordered are listed, but only abnormal results are displayed) Labs Reviewed  URINALYSIS, ROUTINE W REFLEX MICROSCOPIC - Abnormal; Notable for the following components:      Result Value   Protein, ur TRACE (*)    All other components within normal limits  RESP PANEL BY RT-PCR (RSV, FLU A&B, COVID)  RVPGX2  BASIC METABOLIC PANEL  CBC  PREGNANCY, URINE  CBG MONITORING, ED    EKG EKG Interpretation Date/Time:  Monday January 23 2023 11:45:30 EDT Ventricular Rate:  78 PR Interval:  193 QRS Duration:  76 QT Interval:  363 QTC Calculation: 414 R Axis:   35  Text Interpretation: Sinus rhythm Borderline T abnormalities, anterior leads Confirmed by Vanetta Mulders [phone]) on 01/23/2023 11:47:33 AM  Radiology No results  found.  Procedures Procedures    Medications Ordered in ED Medications  sodium chloride 0.9 % bolus 1,000 mL (0 mLs Intravenous Stopped 01/23/23 1358)  ketorolac (TORADOL) 30 MG/ML injection 30 mg (30 mg Intravenous Given 01/23/23 1250)  ondansetron (ZOFRAN) injection 4 mg (4 mg Intravenous Given 01/23/23 1249)  meclizine (ANTIVERT) tablet 25 mg (25 mg Oral Given 01/23/23 1358)  oxyCODONE-acetaminophen (PERCOCET/ROXICET) 5-325 MG per tablet 1 tablet (1 tablet Oral Given 01/23/23 1509)    ED Course/ Medical Decision Making/ A&P  Medical Decision Making Amount and/or Complexity of Data Reviewed Labs: ordered.  Risk Prescription drug management.   This patient presents to the ED with chief complaint(s) of dizziness, syncope with collapse with pertinent past medical history of migraine, anemia, GAD.  The complaint involves an extensive differential diagnosis and also carries with it a high risk of complications and morbidity.    The differential diagnosis includes viral/infectious etiology, dehydration, metabolic derangement, complex migraine   The initial plan is to obtain labs  Initial Assessment:   Exam significant for ill-appearing patient who is not in acute distress.  Heart rate is normal with regular rhythm.  Lungs clear to auscultation.  Abdomen is soft and nontender to palpation.  Skin is warm and dry.  EOM intact without visible nystagmus.  Patient has subjective increase in dizziness when looking to the left.  PERRL.  Normal strength in extremities.  Sensation grossly intact.  Independent ECG/labs interpretation:  The following labs were independently interpreted:  Negative for COVID, flu, RSV.  Metabolic panel without derangement.  CBC without leukocytosis or anemia.  UA without infection. ECG with sinus rhythm, no obvious ischemia or ectopy.  Treatment and Reassessment: Patient given IV fluids, Zofran, and Toradol with some improvement of  symptoms.  Patient does continue to complain of dizziness, which improved after meclizine.  Patient has ongoing pain in her extremities, which she attributes to her probable syncopal episode and fall.  Patient given dose of pain medicine with improvement.    Disposition:   Will send patient home on prescription for meclizine to help with dizziness.  Recommended PCP follow-up.  Patient's workup today is overall reassuring.  Suspect symptoms may be related to increased stress and decreased oral intake and sleep, however, viral or other infectious etiology cannot be completely ruled out.  The patient has been appropriately medically screened and/or stabilized in the ED. I have low suspicion for any other emergent medical condition which would require further screening, evaluation or treatment in the ED or require inpatient management. At time of discharge the patient is hemodynamically stable and in no acute distress. I have discussed work-up results and diagnosis with patient and answered all questions. Patient is agreeable with discharge plan. We discussed strict return precautions for returning to the emergency department and they verbalized understanding.           Final Clinical Impression(s) / ED Diagnoses Final diagnoses:  Dizziness  Syncope and collapse    Rx / DC Orders ED Discharge Orders          Ordered    meclizine (ANTIVERT) 25 MG tablet  3 times daily PRN        01/23/23 [address] PA-C 01/23/23 1743    Vanetta Mulders, MD 01/27/23 1341

## 2023-01-23 NOTE — ED Triage Notes (Signed)
C/O dizziness "room-spinning" sensation. Stated started Friday, and worst today. Stated she is under a lot of stress lately, and had some drinks over the weekend. Endorsed takes Celexa for anxiety / depression.

## 2023-01-23 NOTE — ED Notes (Signed)
Discharge instructions, prescription, and follow up care reviewed and explained. Pt verbalized understanding and had no further questions on d/c. Pt caox4, ambulatory, NAD on d/c, friend was picking up pt to transport home d/t meds administered.

## 2023-01-23 NOTE — Discharge Instructions (Addendum)
Thank you for allowing [place] to be a part of your care today.  You were evaluated in the ED for dizziness, syncopal episode with collapse.  Your workup today is overall reassuring.  You are negative for COVID, flu, and RSV.  I have sent over a medication called meclizine to the pharmacy to help with dizziness.  This is one of the medications he received while in the ED.  I recommend alternating [phone] mg of Tylenol with 600-800 mg of ibuprofen every 3-4 hours as needed for pain.  DO NOT exceed 3200 mg of ibuprofen or 4000 mg of Tylenol in a 24-hour period.  I recommend following up with your primary care provider should your symptoms not begin to improve.  Be sure to get plenty of rest and increase your fluid intake while you feel unwell.    Return to the ED if you develop sudden worsening of your symptoms or if you have any new concerns.

## 2023-01-26 ENCOUNTER — Encounter (HOSPITAL_BASED_OUTPATIENT_CLINIC_OR_DEPARTMENT_OTHER): Payer: Self-pay | Admitting: Emergency Medicine

## 2023-01-26 ENCOUNTER — Emergency Department (HOSPITAL_BASED_OUTPATIENT_CLINIC_OR_DEPARTMENT_OTHER)
Admission: EM | Admit: 2023-01-26 | Discharge: 2023-01-26 | Disposition: A | Discharge status: Home / Self Care | Payer: Commercial Managed Care - PPO | Attending: Emergency Medicine | Admitting: Emergency Medicine

## 2023-01-26 ENCOUNTER — Other Ambulatory Visit: Payer: Self-pay

## 2023-01-26 DIAGNOSIS — M25511 Pain in right shoulder: Secondary | ICD-10-CM | POA: Insufficient documentation

## 2023-01-26 DIAGNOSIS — T148XXA Other injury of unspecified body region, initial encounter: Secondary | ICD-10-CM

## 2023-01-26 DIAGNOSIS — M545 Low back pain, unspecified: Secondary | ICD-10-CM | POA: Insufficient documentation

## 2023-01-26 DIAGNOSIS — M25512 Pain in left shoulder: Secondary | ICD-10-CM | POA: Insufficient documentation

## 2023-01-26 DIAGNOSIS — M791 Myalgia, unspecified site: Secondary | ICD-10-CM | POA: Diagnosis present

## 2023-01-26 MED ORDER — LIDOCAINE 5 % EX PTCH
1.0000 | MEDICATED_PATCH | CUTANEOUS | Status: DC
  Administered 2023-01-26: 1 via TRANSDERMAL
  Filled 2023-01-26: qty 1

## 2023-01-26 MED ORDER — KETOROLAC TROMETHAMINE 15 MG/ML IJ SOLN
15.0000 mg | Freq: Once | INTRAMUSCULAR | Status: AC
  Administered 2023-01-26: 15 mg via INTRAMUSCULAR
  Filled 2023-01-26: qty 1

## 2023-01-26 MED ORDER — LIDOCAINE 5 % EX PTCH: 1.0000 | MEDICATED_PATCH | CUTANEOUS | 0 refills | Status: AC

## 2023-01-26 MED ORDER — CYCLOBENZAPRINE HCL 10 MG PO TABS: 10.0000 mg | ORAL_TABLET | Freq: Two times a day (BID) | ORAL | 0 refills | Status: DC | PRN

## 2023-01-26 NOTE — ED Provider Notes (Addendum)
[place] EMERGENCY DEPARTMENT AT [facility] Provider Note   CSN: [id number] Arrival date & time: 01/26/23  1446     History  No chief complaint on file.   Wanda Nixon is a 32 y.o. female history of GAD presenting with generalized muscle aches the past few days.  Patient was seen in the ER 3 days ago after syncopal event and had a workup that was reassuring.  Patient notes that she is under a lot of stress that she is going through a divorce and is stressed at work and in the home with the children which caused her to pass out.  States since then she has been taking Tylenol and ibuprofen to no relief.  Patient does note that she carries most of the pain in her traps and lower back and that  massages is to alleviate the symptoms.  Patient does see primary care tomorrow however states that she cannot take the pain anymore.  Patient denies chest pain, shortness of breath, fevers, new onset weakness, changes sensation/motor skills, vision changes, headache, blood thinners, abdominal pain, nausea/vomiting  Home Medications Prior to Admission medications   Medication Sig Start Date End Date Taking? Authorizing Provider  cyclobenzaprine (FLEXERIL) 10 MG tablet Take 1 tablet (10 mg total) by mouth 2 (two) times daily as needed for muscle spasms. 01/26/23  Yes Macguire Holsinger, Fayrene Fearing T, PA-C  lidocaine (LIDODERM) 5 % Place 1 patch onto the skin daily. Remove & Discard patch within 12 hours or as directed by MD 01/26/23  Yes Rahkim Rabalais, Beverly Gust, PA-C  albuterol (VENTOLIN HFA) 108 (90 Base) MCG/ACT inhaler Inhale 2 puffs into the lungs every 6 (six) hours as needed for wheezing or shortness of breath. 05/06/22   Alfredo Martinez, MD  citalopram (CELEXA) 10 MG tablet Take 1 tablet (10 mg total) by mouth daily. 06/21/22   Alicia Amel, MD  citalopram (CELEXA) 40 MG tablet TAKE 1 TABLET BY MOUTH EVERY DAY 04/19/22   Maury Dus, MD  clonazePAM (KLONOPIN) 0.5 MG tablet Take 1 tablet (0.5 mg total) by  mouth 2 (two) times daily as needed for anxiety. 11/10/22   Levin Erp, MD  ibuprofen (ADVIL) 800 MG tablet Take 1 tablet (800 mg total) by mouth 3 (three) times daily. 10/22/22   Mesner, Barbara Cower, MD  meclizine (ANTIVERT) 25 MG tablet Take 1 tablet (25 mg total) by mouth 3 (three) times daily as needed. 01/23/23   Clark, Meghan R, PA-C  ondansetron (ZOFRAN-ODT) 4 MG disintegrating tablet 4mg  ODT q4 hours prn nausea/vomit 10/22/22   Mesner, Barbara Cower, MD  senna-docusate (SENOKOT-S) 8.6-50 MG tablet Take 1 tablet by mouth daily. 09/25/22   Elayne Snare K, DO  triamcinolone cream (KENALOG) 0.1 % APPLY TO AFFECTED AREA TWICE A DAY 08/27/22   Maury Dus, MD  Vitamin D, Ergocalciferol, (DRISDOL) 1.25 MG (50000 UNIT) CAPS capsule TAKE 1 CAPSULE BY MOUTH EVERY 7 DAYS. WHEN YOU RUN OUT OF THESE 12 TABLETS, PLEASE RETURN TO CLINIC FOR VIT. D LEVEL RECHECK. 08/23/22   Maury Dus, MD      Allergies    Tomato, Augmentin [amoxicillin-pot clavulanate], Mushroom extract complex, and Reglan [metoclopramide]    Review of Systems   Review of Systems  Physical Exam Updated Vital Signs BP (!) 129/100   Pulse 82   Temp 98.6 F (37 C) (Oral)   Resp 16   LMP 01/19/2023   SpO2 100%  Physical Exam Vitals reviewed.  Constitutional:      General: She is not in  acute distress.    Comments: Resting comfortably on bed with phone  HENT:     Head: Normocephalic and atraumatic.  Eyes:     Extraocular Movements: Extraocular movements intact.     Conjunctiva/sclera: Conjunctivae normal.     Pupils: Pupils are equal, round, and reactive to light.  Cardiovascular:     Rate and Rhythm: Normal rate and regular rhythm.     Pulses: Normal pulses.     Heart sounds: Normal heart sounds.     Comments: 2+ bilateral radial/dorsalis pedis pulses with regular rate Pulmonary:     Effort: Pulmonary effort is normal. No respiratory distress.     Breath sounds: Normal breath sounds.  Abdominal:     Palpations:  Abdomen is soft.     Tenderness: There is no abdominal tenderness. There is no guarding or rebound.  Musculoskeletal:        General: Normal range of motion.     Cervical back: Normal range of motion and neck supple.     Comments: 5 out of 5 bilateral grip/leg extension strength Tenderness palpation in bilateral trapezius muscles and bilateral paralumbar muscles without midline tenderness or abnormalities  Skin:    General: Skin is warm and dry.     Capillary Refill: Capillary refill takes less than 2 seconds.  Neurological:     General: No focal deficit present.     Mental Status: She is alert and oriented to person, place, and time.     Comments: Sensation intact in all 4 limbs Vision grossly intact Cranial nerves III through XII intact Seen walking to the room without gait abnormalities Negative Hoffmann bilaterally   Psychiatric:        Mood and Affect: Mood normal.     ED Results / Procedures / Treatments   Labs (all labs ordered are listed, but only abnormal results are displayed) Labs Reviewed - No data to display  EKG None  Radiology No results found.  Procedures Procedures    Medications Ordered in ED Medications  lidocaine (LIDODERM) 5 % 1 patch (has no administration in time range)  ketorolac (TORADOL) 15 MG/ML injection 15 mg (has no administration in time range)    ED Course/ Medical Decision Making/ A&P                                 Medical Decision Making Risk Prescription drug management.   Lindell Spar Badon 32 y.o. presented today for generalized bodyaches. Working DDx that I considered at this time includes, but not limited to, stress-induced myalgias, spinal cord pathology, vertebral fracture, URI.  R/o DDx: spinal cord pathology, vertebral fracture, URI: These are considered less likely due to history of present illness, physical exam, labs/imaging findings  Review of prior external notes: 01/23/2023 ED  Unique Tests and My  Interpretation: None  Discussion with Independent Historian: None  Discussion of Management of Tests: None  Risk: Medium: prescription drug management  Risk Stratification Score: None  Plan: On exam patient was in no acute distress with stable vitals.  Patient had reassuring physical exam however on her exam I was able to palpate tenderness in the bilateral trapezius muscles and bilateral paralumbar muscles suspicious of possible MSK cause for patient's pains.  Patient does note that she is under a lot of stress and so have high suspicion that in conjunction with her symptoms improving with massages that in his case most likely cause.  Patient  given Toradol as she had a negative pregnancy test 3 days ago and stated that this helped along with lidocaine patches.  I have also prescribed patient Flexeril to help her sleep and help her muscles relax as patient states that she will be able to follow-up with her primary care provider tomorrow she has an appointment already.  Patient asked for Percocet however given the suspicion that patient's pain is MSK in nature I spoke to the patient extensively and compassionately about how Percocets are not indicated for this as there are other ways we can treat patient's pain and that it would be a disservice to her to give her opioids for MSK pain.  I spoke to the patient by doing a larger workup today including images and labs and a respiratory panel however patient declined as she states these were done the other day and does not want to repeat all the labs and does not believe at this time that she needs imaging and just wants to have her pain managed.  Patient has full decision-making capacity at this time and given patient's reassuring physical exam believe this is a reasonable request.  Given patient's physical exam have a low suspicion that patient's pain is cardiopulmonary in nature as I was able to reproduce patient's pain with palpation of the musculature and  patient does not endorse any chest pain or shortness of breath either.  Patient also does not have history of enlarged aorta or dissection or AAA or blood clots.  Patient was given return precautions. Patient stable for discharge at this time.  Patient verbalized understanding of plan.  At time of discharge patient requested to speak with me again about getting Percocet.  This conversation was witnessed by Freida Busman, Charity fundraiser.  Patient did not appear in distress the bed on her phone when we entered and after extensive conversation we agreed to further forego Percocet as we believe the pain is MSK in nature and that we need to treat the muscle tension to give the patient relief.  We encouraged hot showers, heat packs and rest along with stretches.  Patient states that she wanted to do more organic methods for her pain and so with the heat treatment we believe this would need our goal.  At time of discharge patient was in agreements with this plan and did not appear in distress.  This chart was dictated using voice recognition software.  Despite best efforts to proofread,  errors can occur which can change the documentation meaning.  Final Clinical Impression(s) / ED Diagnoses Final diagnoses:  Muscle strain    Rx / DC Orders ED Discharge Orders          Ordered    cyclobenzaprine (FLEXERIL) 10 MG tablet  2 times daily PRN        01/26/23 1527    lidocaine (LIDODERM) 5 %  Every 24 hours        01/26/23 1527             Remi Deter 01/26/23 1544    Netta Corrigan, PA-C 01/26/23 1559    Terrilee Files, MD 01/27/23 1011

## 2023-01-26 NOTE — Discharge Instructions (Signed)
Please follow-up with your primary care provider in regards to recent symptoms and ER visit.  As we discussed your symptoms are most likely related to your stress and we agreed to treat your symptoms with conservative measurements.  Please use heat packs for your pains to help loosen the muscles.  You may use Tylenol ibuprofen every 6 hours you are using now.  I have also prescribed for you lidocaine patches to help for topical relief.  I have prescribed for you Flexeril which is a muscle relaxer.  Please do not operate machinery or drive after taking this medication as it is very sedating so please take it before bedtime.  If symptoms change or worsen please return to ER.

## 2023-01-26 NOTE — ED Notes (Signed)
Discharge patient review instructions .  Patient refused lidocaine patches stating they do not work.  Insisting on percocet.  Toradol IM given.  Wanting to talk to PA Evlyn Kanner regarding discharge.  PA re spoke to patient explaining discharge plan and that percocet would not be given and why.  Patient in ER alone. Other recommendations given such as heat, hot showers, and following up with her PCP.  Patient left ER ambulatory without difficulty

## 2023-01-26 NOTE — ED Notes (Signed)
Pt requested this RN speak to PA additionally before being discharged about ordering Percocet medication. Pt insisted on receiving Percocet. Pt stated "I don't want to pay for anything or do anything that I already know isn't going to work. I know my body and I know what works. I will stay here and take the percocet and be monitored if that is needed. I just know the Toradol and Percocet work." PA notified.

## 2023-01-26 NOTE — ED Triage Notes (Signed)
Body ache and pains. Tylenol and ibuprofen no relief, See for dizziness and fainting on 01/23/2023. Reports increased stress at home and work

## 2023-01-27 ENCOUNTER — Ambulatory Visit: Payer: Commercial Managed Care - PPO | Admitting: Family Medicine

## 2023-01-30 ENCOUNTER — Ambulatory Visit (INDEPENDENT_AMBULATORY_CARE_PROVIDER_SITE_OTHER): Payer: Commercial Managed Care - PPO | Admitting: Student

## 2023-01-30 ENCOUNTER — Other Ambulatory Visit (HOSPITAL_COMMUNITY)
Admission: RE | Admit: 2023-01-30 | Discharge: 2023-01-30 | Disposition: A | Discharge status: Home / Self Care | Payer: Commercial Managed Care - PPO | Source: Ambulatory Visit | Attending: Family Medicine | Admitting: Family Medicine

## 2023-01-30 ENCOUNTER — Encounter: Payer: Self-pay | Admitting: Student

## 2023-01-30 VITALS — BP 132/95 | HR 92 | Ht 59.0 in | Wt 124.6 lb

## 2023-01-30 DIAGNOSIS — Z113 Encounter for screening for infections with a predominantly sexual mode of transmission: Secondary | ICD-10-CM | POA: Diagnosis present

## 2023-01-30 DIAGNOSIS — M791 Myalgia, unspecified site: Secondary | ICD-10-CM | POA: Diagnosis not present

## 2023-01-30 LAB — POCT WET PREP (WET MOUNT)
Clue Cells Wet Prep Whiff POC: NEGATIVE
Trichomonas Wet Prep HPF POC: ABSENT

## 2023-01-30 MED ORDER — BACLOFEN 5 MG PO TABS
5.0000 mg | ORAL_TABLET | Freq: Three times a day (TID) | ORAL | 0 refills | Status: AC
Start: 2023-01-30 — End: 2023-03-01

## 2023-01-30 NOTE — Progress Notes (Addendum)
Baclofen    SUBJECTIVE:   CHIEF COMPLAINT / HPI:   Patient is a 32 year old female with PMH of GAD and migraine presenting today for ED follow-up.  Was seen in the ED for muscle ache and which was suspected to be due to muscle strain and given multiple points of tenderness on palpation.  Prior to her last visit was seen on 9/23 and again on 9/26.  All labs at that time were reassuring.  At the time patient endorses life stressors including ongoing divorce's which could have been attributing to her overall symptoms.  She was sent home on Flexeril and lidocaine patch.  Reports improvement of symptom but has not started Flexeril due to concerns of sleepiness associated with the medication.  PERTINENT  PMH / PSH: Reviewed  OBJECTIVE:   BP (!) 132/95   Pulse 92   Ht 4\' 11"  (1.499 m)   Wt 124 lb 9.6 oz (56.5 kg)   LMP 01/19/2023   SpO2 100%   BMI 25.17 kg/m     Flowsheet Row Office Visit from 01/30/2023 in [facility]  PHQ-9 Total Score 14        Physical Exam General: Alert, well appearing, NAD Cardiovascular: RRR, No Murmurs, Normal S2/S2 Respiratory: CTAB, No wheezing or Rales Abdomen: No distension or tenderness Extremities: 5 out of 5 strength in all extremities with sensations intact. MSK: Diffused trigger points on palpation mostly in the upper back and upper extremities. Genitalia:  Normal introitus for age, no external lesions, no vaginal discharge, mucosa pink and moist, no vaginal or cervical lesions, no vaginal atrophy  CMA Stacey as chaperone for the exam.  ASSESSMENT/PLAN:   Diffuse muscle ache Clear cause of patient's muscle ache however this seems to be improving.  Differential include myalgia, thyroid or telemetry etiology or possible fibromyalgia.  Will obtain further labs to assess possible causes. -Obtain labs for CK, TSH -Encourage aerobic exercise including water activity -Encourage patient to continue counseling -Continue  duloxetine as scheduled  STD screening Patient recently had unprotected sexual encounter although asymptomatic at this time expressed desire to have STD testing. -Obtain lab for GC/chlamydia, HIV and RPR -Discussed safe sex practices  Elevated PHQ-9 Patient endorses having significant life stressors including recent divorce.  No SI/HI at this time.  Gets counseling weekly and currently on citalopram.    Jerre Simon, MD [facility]

## 2023-01-30 NOTE — Patient Instructions (Addendum)
It was wonderful to see you today. Thank you for allowing me to be a part of your care. Below is a short summary of what we discussed at your visit today:  Glad to hear that your symptoms have improved since your discharge.  Today I am checking lab for your muscle enzymes and thyroid given your muscle ache.  I feel like there could be a component of fibromyalgia addition to ongoing stressors in your life.  I recommend getting engage with aerobic exercise, should pain continue or we can try order muscle relaxers.  Continue counseling and continue taking your citalopram as scheduled.  Today we collected samples for STD testing including gonorrhea, chlamydia, HIV and syphilis.  Please bring all of your medications to every appointment!  If you have any questions or concerns, please do not hesitate to contact us via phone or MyChart message.   Jerre Simon, MD [facility]

## 2023-01-31 LAB — CERVICOVAGINAL ANCILLARY ONLY
Chlamydia: NEGATIVE
Comment: NEGATIVE
Comment: NEGATIVE
Comment: NORMAL
Neisseria Gonorrhea: NEGATIVE
Trichomonas: NEGATIVE

## 2023-01-31 LAB — HIV ANTIBODY (ROUTINE TESTING W REFLEX): HIV Screen 4th Generation wRfx: NONREACTIVE

## 2023-01-31 LAB — CK: Total CK: 65 U/L (ref 32–182)

## 2023-01-31 LAB — RPR: RPR Ser Ql: NONREACTIVE

## 2023-01-31 LAB — TSH: TSH: 1.4 u[IU]/mL (ref 0.450–4.500)

## 2023-02-18 ENCOUNTER — Emergency Department (HOSPITAL_BASED_OUTPATIENT_CLINIC_OR_DEPARTMENT_OTHER)
Admission: EM | Admit: 2023-02-18 | Discharge: 2023-02-18 | Disposition: A | Discharge status: Home / Self Care | Payer: Commercial Managed Care - PPO | Attending: Emergency Medicine | Admitting: Emergency Medicine

## 2023-02-18 ENCOUNTER — Other Ambulatory Visit: Payer: Self-pay

## 2023-02-18 ENCOUNTER — Encounter (HOSPITAL_BASED_OUTPATIENT_CLINIC_OR_DEPARTMENT_OTHER): Payer: Self-pay

## 2023-02-18 DIAGNOSIS — R112 Nausea with vomiting, unspecified: Secondary | ICD-10-CM | POA: Diagnosis not present

## 2023-02-18 DIAGNOSIS — R519 Headache, unspecified: Secondary | ICD-10-CM | POA: Insufficient documentation

## 2023-02-18 DIAGNOSIS — J45909 Unspecified asthma, uncomplicated: Secondary | ICD-10-CM | POA: Diagnosis not present

## 2023-02-18 LAB — CBC
HCT: 37.8 % (ref 36.0–46.0)
Hemoglobin: 13 g/dL (ref 12.0–15.0)
MCH: 29.1 pg (ref 26.0–34.0)
MCHC: 34.4 g/dL (ref 30.0–36.0)
MCV: 84.8 fL (ref 80.0–100.0)
Platelets: 327 10*3/uL (ref 150–400)
RBC: 4.46 MIL/uL (ref 3.87–5.11)
RDW: 13.3 % (ref 11.5–15.5)
WBC: 3.8 10*3/uL — ABNORMAL LOW (ref 4.0–10.5)
nRBC: 0 % (ref 0.0–0.2)

## 2023-02-18 LAB — COMPREHENSIVE METABOLIC PANEL
ALT: 7 U/L (ref 0–44)
AST: 16 U/L (ref 15–41)
Albumin: 4.6 g/dL (ref 3.5–5.0)
Alkaline Phosphatase: 40 U/L (ref 38–126)
Anion gap: 7 (ref 5–15)
BUN: 8 mg/dL (ref 6–20)
CO2: 25 mmol/L (ref 22–32)
Calcium: 9.6 mg/dL (ref 8.9–10.3)
Chloride: 107 mmol/L (ref 98–111)
Creatinine, Ser: 0.7 mg/dL (ref 0.44–1.00)
GFR, Estimated: >60 mL/min (ref >60)
Glucose, Bld: 81 mg/dL (ref 70–99)
Potassium: 3.8 mmol/L (ref 3.5–5.1)
Sodium: 139 mmol/L (ref 135–145)
Total Bilirubin: 0.6 mg/dL (ref 0.3–1.2)
Total Protein: 7.5 g/dL (ref 6.5–8.1)

## 2023-02-18 LAB — URINALYSIS, ROUTINE W REFLEX MICROSCOPIC
Bacteria, UA: NONE SEEN
Bilirubin Urine: NEGATIVE
Glucose, UA: NEGATIVE mg/dL
Ketones, ur: NEGATIVE mg/dL
Nitrite: NEGATIVE
Specific Gravity, Urine: 1.025 (ref 1.005–1.030)
pH: 8.5 — ABNORMAL HIGH (ref 5.0–8.0)

## 2023-02-18 LAB — PREGNANCY, URINE: Preg Test, Ur: NEGATIVE

## 2023-02-18 LAB — LIPASE, BLOOD: Lipase: 12 U/L (ref 11–51)

## 2023-02-18 MED ORDER — DIPHENHYDRAMINE HCL 50 MG/ML IJ SOLN
12.5000 mg | Freq: Once | INTRAMUSCULAR | Status: AC
  Administered 2023-02-18: 12.5 mg via INTRAVENOUS
  Filled 2023-02-18: qty 1

## 2023-02-18 MED ORDER — PROCHLORPERAZINE EDISYLATE 10 MG/2ML IJ SOLN
10.0000 mg | Freq: Once | INTRAMUSCULAR | Status: AC
  Administered 2023-02-18: 10 mg via INTRAVENOUS
  Filled 2023-02-18: qty 2

## 2023-02-18 MED ORDER — SODIUM CHLORIDE 0.9 % IV BOLUS
500.0000 mL | Freq: Once | INTRAVENOUS | Status: AC
  Administered 2023-02-18: 500 mL via INTRAVENOUS

## 2023-02-18 MED ORDER — KETOROLAC TROMETHAMINE 30 MG/ML IJ SOLN
30.0000 mg | Freq: Once | INTRAMUSCULAR | Status: AC
  Administered 2023-02-18: 30 mg via INTRAVENOUS
  Filled 2023-02-18: qty 1

## 2023-02-18 MED ORDER — ONDANSETRON 4 MG PO TBDP
4.0000 mg | ORAL_TABLET | Freq: Once | ORAL | Status: AC | PRN
  Administered 2023-02-18: 4 mg via ORAL
  Filled 2023-02-18: qty 1

## 2023-02-18 NOTE — ED Provider Notes (Signed)
[place] EMERGENCY DEPARTMENT AT [facility] Provider Note   CSN: [id number] Arrival date & time: 02/18/23  1027     History {Add pertinent medical, surgical, social history, OB history to HPI:1} Chief Complaint  Patient presents with   Headache    ETOH    Wanda Nixon is a 32 y.o. female.  HPI      Wine, 3 shots of tequila celebrating birthday Headache started when woke up this AM, neck hurting, feels like can barely lift head Nausea, vomiting last night Drank pedialyte prior No abdominal pain Diarrhea 2-3 times On menses Head pounding, has had hx of migraines, feels like that It is worse with bright lights and loud sounds, like squeezing, heaviness Denies numbness, weakness, difficulty talking or walking, visual changes or facial droop.    Home Medications Prior to Admission medications   Medication Sig Start Date End Date Taking? Authorizing Provider  albuterol (VENTOLIN HFA) 108 (90 Base) MCG/ACT inhaler Inhale 2 puffs into the lungs every 6 (six) hours as needed for wheezing or shortness of breath. 05/06/22   Alfredo Martinez, MD  baclofen 5 MG TABS Take 1 tablet (5 mg total) by mouth 3 (three) times daily. 01/30/23 03/01/23  Jerre Simon, MD  citalopram (CELEXA) 10 MG tablet Take 1 tablet (10 mg total) by mouth daily. 06/21/22   Alicia Amel, MD  citalopram (CELEXA) 40 MG tablet TAKE 1 TABLET BY MOUTH EVERY DAY 04/19/22   Maury Dus, MD  clonazePAM (KLONOPIN) 0.5 MG tablet Take 1 tablet (0.5 mg total) by mouth 2 (two) times daily as needed for anxiety. 11/10/22   Levin Erp, MD  cyclobenzaprine (FLEXERIL) 10 MG tablet Take 1 tablet (10 mg total) by mouth 2 (two) times daily as needed for muscle spasms. 01/26/23   Netta Corrigan, PA-C  ibuprofen (ADVIL) 800 MG tablet Take 1 tablet (800 mg total) by mouth 3 (three) times daily. 10/22/22   Mesner, Barbara Cower, MD  lidocaine (LIDODERM) 5 % Place 1 patch onto the skin daily. Remove & Discard patch  within 12 hours or as directed by MD 01/26/23   Netta Corrigan, PA-C  meclizine (ANTIVERT) 25 MG tablet Take 1 tablet (25 mg total) by mouth 3 (three) times daily as needed. 01/23/23   Clark, Meghan R, PA-C  ondansetron (ZOFRAN-ODT) 4 MG disintegrating tablet 4mg  ODT q4 hours prn nausea/vomit 10/22/22   Mesner, Barbara Cower, MD  senna-docusate (SENOKOT-S) 8.6-50 MG tablet Take 1 tablet by mouth daily. 09/25/22   Elayne Snare K, DO  triamcinolone cream (KENALOG) 0.1 % APPLY TO AFFECTED AREA TWICE A DAY 08/27/22   Maury Dus, MD  Vitamin D, Ergocalciferol, (DRISDOL) 1.25 MG (50000 UNIT) CAPS capsule TAKE 1 CAPSULE BY MOUTH EVERY 7 DAYS. WHEN YOU RUN OUT OF THESE 12 TABLETS, PLEASE RETURN TO CLINIC FOR VIT. D LEVEL RECHECK. 08/23/22   Maury Dus, MD      Allergies    Tomato, Augmentin [amoxicillin-pot clavulanate], Mushroom extract complex, and Reglan [metoclopramide]    Review of Systems   Review of Systems  Physical Exam Updated Vital Signs BP (!) 137/97 (BP Location: Right Arm)   Pulse 79   Temp 98.5 F (36.9 C) (Oral)   Resp 16   Ht 4\' 11"  (1.499 m)   Wt 57.6 kg   LMP 02/16/2023   SpO2 100%   BMI 25.65 kg/m  Physical Exam  ED Results / Procedures / Treatments   Labs (all labs ordered are listed, but only abnormal results  are displayed) Labs Reviewed  CBC - Abnormal; Notable for the following components:      Result Value   WBC 3.8 (*)    All other components within normal limits  URINALYSIS, ROUTINE W REFLEX MICROSCOPIC - Abnormal; Notable for the following components:   APPearance HAZY (*)    pH 8.5 (*)    Hgb urine dipstick MODERATE (*)    Protein, ur TRACE (*)    Leukocytes,Ua TRACE (*)    All other components within normal limits  LIPASE, BLOOD  COMPREHENSIVE METABOLIC PANEL  PREGNANCY, URINE    EKG None  Radiology No results found.  Procedures Procedures  {Document cardiac monitor, telemetry assessment procedure when appropriate:1}  Medications  Ordered in ED Medications  ondansetron (ZOFRAN-ODT) disintegrating tablet 4 mg (4 mg Oral Given 02/18/23 1120)    ED Course/ Medical Decision Making/ A&P   {   Click here for ABCD2, HEART and other calculatorsREFRESH Note before signing :1}                              Medical Decision Making Amount and/or Complexity of Data Reviewed Labs: ordered.  Risk Prescription drug management.   ***  {Document critical care time when appropriate:1} {Document review of labs and clinical decision tools ie heart score, Chads2Vasc2 etc:1}  {Document your independent review of radiology images, and any outside records:1} {Document your discussion with family members, caretakers, and with consultants:1} {Document social determinants of health affecting pt's care:1} {Document your decision making why or why not admission, treatments were needed:1} Final Clinical Impression(s) / ED Diagnoses Final diagnoses:  None    Rx / DC Orders ED Discharge Orders     None

## 2023-02-18 NOTE — Discharge Instructions (Signed)
HAPPY BIRTHDAY!!!

## 2023-02-18 NOTE — ED Triage Notes (Signed)
Pt presents with HA, nausea, vomiting after having too much ETOH last night during a birthday celebration.

## 2023-02-21 ENCOUNTER — Other Ambulatory Visit: Payer: Self-pay | Admitting: Family Medicine

## 2023-02-21 ENCOUNTER — Other Ambulatory Visit: Payer: Self-pay | Admitting: Student

## 2023-02-21 DIAGNOSIS — M791 Myalgia, unspecified site: Secondary | ICD-10-CM

## 2023-03-22 ENCOUNTER — Other Ambulatory Visit: Payer: Self-pay

## 2023-05-30 ENCOUNTER — Telehealth: Payer: Self-pay

## 2023-05-30 DIAGNOSIS — F411 Generalized anxiety disorder: Secondary | ICD-10-CM

## 2023-05-30 NOTE — Telephone Encounter (Signed)
Patient LVM on nurse line requesting refill on citalopram. Patient has both 10 mg and 40 mg on medication list.   Called patient to determine which dosage she is needing refill on.   Patient did not answer, LVM asking her to call back to clarify dosage.   Veronda Prude, RN

## 2023-05-31 ENCOUNTER — Other Ambulatory Visit: Payer: Self-pay | Admitting: Family Medicine

## 2023-05-31 DIAGNOSIS — F411 Generalized anxiety disorder: Secondary | ICD-10-CM

## 2023-05-31 MED ORDER — CITALOPRAM HYDROBROMIDE 20 MG PO TABS
20.0000 mg | ORAL_TABLET | Freq: Every day | ORAL | 3 refills | Status: DC
Start: 2023-05-31 — End: 2023-11-24

## 2023-05-31 NOTE — Telephone Encounter (Signed)
Patient returns call to nurse line. She is taking 20 mg citalopram. She has been breaking the 40 mg tablets.   Please advise if new rx can be sent for citalopram 20 mg.   Veronda Prude, RN

## 2023-07-06 DIAGNOSIS — A084 Viral intestinal infection, unspecified: Secondary | ICD-10-CM | POA: Diagnosis not present

## 2023-08-02 ENCOUNTER — Emergency Department (HOSPITAL_BASED_OUTPATIENT_CLINIC_OR_DEPARTMENT_OTHER)
Admission: EM | Admit: 2023-08-02 | Discharge: 2023-08-02 | Disposition: A | Discharge status: Home / Self Care | Attending: Emergency Medicine | Admitting: Emergency Medicine

## 2023-08-02 ENCOUNTER — Encounter (HOSPITAL_BASED_OUTPATIENT_CLINIC_OR_DEPARTMENT_OTHER): Payer: Self-pay | Admitting: Emergency Medicine

## 2023-08-02 DIAGNOSIS — N809 Endometriosis, unspecified: Secondary | ICD-10-CM | POA: Diagnosis not present

## 2023-08-02 DIAGNOSIS — R1030 Lower abdominal pain, unspecified: Secondary | ICD-10-CM | POA: Diagnosis not present

## 2023-08-02 LAB — CBC WITH DIFFERENTIAL/PLATELET
Abs Immature Granulocytes: 0.01 10*3/uL (ref 0.00–0.07)
Basophils Absolute: 0 10*3/uL (ref 0.0–0.1)
Basophils Relative: 0 %
Eosinophils Absolute: 0.1 10*3/uL (ref 0.0–0.5)
Eosinophils Relative: 1 %
HCT: 39.3 % (ref 36.0–46.0)
Hemoglobin: 13.6 g/dL (ref 12.0–15.0)
Immature Granulocytes: 0 %
Lymphocytes Relative: 26 %
Lymphs Abs: 1.2 10*3/uL (ref 0.7–4.0)
MCH: 29.2 pg (ref 26.0–34.0)
MCHC: 34.6 g/dL (ref 30.0–36.0)
MCV: 84.5 fL (ref 80.0–100.0)
Monocytes Absolute: 0.3 10*3/uL (ref 0.1–1.0)
Monocytes Relative: 7 %
Neutro Abs: 3 10*3/uL (ref 1.7–7.7)
Neutrophils Relative %: 66 %
Platelets: 321 10*3/uL (ref 150–400)
RBC: 4.65 MIL/uL (ref 3.87–5.11)
RDW: 13.2 % (ref 11.5–15.5)
WBC: 4.6 10*3/uL (ref 4.0–10.5)
nRBC: 0 % (ref 0.0–0.2)

## 2023-08-02 LAB — COMPREHENSIVE METABOLIC PANEL WITH GFR
ALT: 7 U/L (ref 0–44)
AST: 18 U/L (ref 15–41)
Albumin: 4.5 g/dL (ref 3.5–5.0)
Alkaline Phosphatase: 38 U/L (ref 38–126)
Anion gap: 9 (ref 5–15)
BUN: 11 mg/dL (ref 6–20)
CO2: 24 mmol/L (ref 22–32)
Calcium: 9.3 mg/dL (ref 8.9–10.3)
Chloride: 105 mmol/L (ref 98–111)
Creatinine, Ser: 0.74 mg/dL (ref 0.44–1.00)
GFR, Estimated: >60 mL/min (ref >60)
Glucose, Bld: 87 mg/dL (ref 70–99)
Potassium: 3.9 mmol/L (ref 3.5–5.1)
Sodium: 138 mmol/L (ref 135–145)
Total Bilirubin: 0.6 mg/dL (ref 0.0–1.2)
Total Protein: 7.7 g/dL (ref 6.5–8.1)

## 2023-08-02 LAB — HCG, SERUM, QUALITATIVE: Preg, Serum: NEGATIVE

## 2023-08-02 LAB — LIPASE, BLOOD: Lipase: 21 U/L (ref 11–51)

## 2023-08-02 MED ORDER — FENTANYL CITRATE PF 50 MCG/ML IJ SOSY
50.0000 ug | PREFILLED_SYRINGE | Freq: Once | INTRAMUSCULAR | Status: DC
  Filled 2023-08-02: qty 1

## 2023-08-02 MED ORDER — KETOROLAC TROMETHAMINE 15 MG/ML IJ SOLN
15.0000 mg | Freq: Once | INTRAMUSCULAR | Status: AC
  Administered 2023-08-02: 15 mg via INTRAVENOUS
  Filled 2023-08-02: qty 1

## 2023-08-02 MED ORDER — OXYCODONE HCL 5 MG PO TABS
5.0000 mg | ORAL_TABLET | Freq: Four times a day (QID) | ORAL | 0 refills | Status: AC | PRN
Start: 2023-08-02 — End: 2023-08-04

## 2023-08-02 MED ORDER — OXYCODONE-ACETAMINOPHEN 5-325 MG PO TABS
1.0000 | ORAL_TABLET | Freq: Once | ORAL | Status: AC
  Administered 2023-08-02: 1 via ORAL
  Filled 2023-08-02: qty 1

## 2023-08-02 MED ORDER — ONDANSETRON HCL 4 MG/2ML IJ SOLN
4.0000 mg | Freq: Once | INTRAMUSCULAR | Status: AC
  Administered 2023-08-02: 4 mg via INTRAVENOUS
  Filled 2023-08-02: qty 2

## 2023-08-02 MED ORDER — ONDANSETRON 4 MG PO TBDP: 4.0000 mg | ORAL_TABLET | Freq: Three times a day (TID) | ORAL | 0 refills | Status: DC | PRN

## 2023-08-02 NOTE — ED Provider Notes (Signed)
  EMERGENCY DEPARTMENT AT [facility] Provider Note   CSN: [id number] Arrival date & time: 08/02/23  1757     History  Chief Complaint  Patient presents with   Abdominal Pain    Wanda Nixon is a 33 y.o. female with history of anemia, endometriosis, presents for concern of "endometriosis pain" in her lower abdomen.  She reports flares of her endometriosis pain before her menstrual cycles.  States the flareup of her pain this time is more severe than normal and she has not been able to control her pain well at home with Tylenol and ibuprofen.  She has felt very nauseous all day and has not had much of an appetite.  Denies any fevers, chills, diarrhea, abnormal vaginal discharge, dysuria, hematuria, or increased frequency.   Abdominal Pain      Home Medications Prior to Admission medications   Medication Sig Start Date End Date Taking? Authorizing Provider  citalopram (CELEXA) 20 MG tablet Take 1 tablet (20 mg total) by mouth daily. 05/31/23   Cyndia Skeeters, DO  oxyCODONE (ROXICODONE) 5 MG immediate release tablet Take 1 tablet (5 mg total) by mouth every 6 (six) hours as needed for up to 2 days for severe pain (pain score 7-10) or breakthrough pain (Pain not controlled with Tylenol and ibuprofen). 08/02/23 08/04/23 Yes Arabella Merles, PA-C  albuterol (VENTOLIN HFA) 108 (90 Base) MCG/ACT inhaler Inhale 2 puffs into the lungs every 6 (six) hours as needed for wheezing or shortness of breath. 05/06/22   Alfredo Martinez, MD  clonazePAM (KLONOPIN) 0.5 MG tablet Take 1 tablet (0.5 mg total) by mouth 2 (two) times daily as needed for anxiety. 11/10/22   Levin Erp, MD  cyclobenzaprine (FLEXERIL) 10 MG tablet Take 1 tablet (10 mg total) by mouth 2 (two) times daily as needed for muscle spasms. 01/26/23   Netta Corrigan, PA-C  ibuprofen (ADVIL) 800 MG tablet Take 1 tablet (800 mg total) by mouth 3 (three) times daily. 10/22/22   Mesner, Barbara Cower, MD  lidocaine (LIDODERM) 5 %  Place 1 patch onto the skin daily. Remove & Discard patch within 12 hours or as directed by MD 01/26/23   Netta Corrigan, PA-C  meclizine (ANTIVERT) 25 MG tablet Take 1 tablet (25 mg total) by mouth 3 (three) times daily as needed. 01/23/23   Clark, Meghan R, PA-C  ondansetron (ZOFRAN-ODT) 4 MG disintegrating tablet 4mg  ODT q4 hours prn nausea/vomit 10/22/22   Mesner, Barbara Cower, MD  senna-docusate (SENOKOT-S) 8.6-50 MG tablet Take 1 tablet by mouth daily. 09/25/22   Elayne Snare K, DO  triamcinolone cream (KENALOG) 0.1 % APPLY TO AFFECTED AREA TWICE A DAY 08/27/22   Maury Dus, MD  Vitamin D, Ergocalciferol, (DRISDOL) 1.25 MG (50000 UNIT) CAPS capsule TAKE 1 CAPSULE BY MOUTH EVERY 7 DAYS. WHEN YOU RUN OUT OF THESE 12 TABLETS, PLEASE RETURN TO CLINIC FOR VIT. D LEVEL RECHECK. 08/23/22   Maury Dus, MD      Allergies    Tomato, Augmentin [amoxicillin-pot clavulanate], Mushroom extract complex (obsolete), and Reglan [metoclopramide]    Review of Systems   Review of Systems  Gastrointestinal:  Positive for abdominal pain.    Physical Exam Updated Vital Signs BP (!) 116/92   Pulse 76   Temp 98.4 F (36.9 C) (Oral)   Resp 18   LMP 07/07/2023   SpO2 100%  Physical Exam Vitals and nursing note reviewed.  Constitutional:      General: She is not in acute distress.  Appearance: She is well-developed.  HENT:     Head: Normocephalic and atraumatic.  Eyes:     Conjunctiva/sclera: Conjunctivae normal.  Cardiovascular:     Rate and Rhythm: Normal rate and regular rhythm.     Heart sounds: No murmur heard. Pulmonary:     Effort: Pulmonary effort is normal. No respiratory distress.     Breath sounds: Normal breath sounds.  Abdominal:     Palpations: Abdomen is soft.     Tenderness: There is abdominal tenderness.     Comments: Mild tenderness to palpation in the suprapubic region.  No rebound or guarding  No CVA tenderness bilaterally  Genitourinary:    Comments: Patient  declines GU exam Musculoskeletal:        General: No swelling.     Cervical back: Neck supple.  Skin:    General: Skin is warm and dry.     Capillary Refill: Capillary refill takes less than 2 seconds.  Neurological:     Mental Status: She is alert.  Psychiatric:        Mood and Affect: Mood normal.     ED Results / Procedures / Treatments   Labs (all labs ordered are listed, but only abnormal results are displayed) Labs Reviewed  CBC WITH DIFFERENTIAL/PLATELET  COMPREHENSIVE METABOLIC PANEL WITH GFR  LIPASE, BLOOD  HCG, SERUM, QUALITATIVE    EKG None  Radiology No results found.  Procedures Procedures    Medications Ordered in ED Medications  fentaNYL (SUBLIMAZE) injection 50 mcg (50 mcg Intravenous Patient Refused/Not Given 08/02/23 1915)  oxyCODONE-acetaminophen (PERCOCET/ROXICET) 5-325 MG per tablet 1 tablet (1 tablet Oral Given 08/02/23 1845)  ketorolac (TORADOL) 15 MG/ML injection 15 mg (15 mg Intravenous Given 08/02/23 1907)  ondansetron (ZOFRAN) injection 4 mg (4 mg Intravenous Given 08/02/23 1906)    ED Course/ Medical Decision Making/ A&P                                 Medical Decision Making Amount and/or Complexity of Data Reviewed Labs: ordered.  Risk Prescription drug management.     Differential diagnosis includes but is not limited to endometriosis pain, cholelithiasis, cholangitis, choledocholithiasis, peptic ulcer, gastritis, gastroenteritis, appendicitis, IBS, IBD, DKA, nephrolithiasis, UTI, pyelonephritis, pancreatitis, diverticulitis, mesenteric ischemia, abdominal aortic aneurysm, small bowel obstruction, volvulus, ovarian torsion and pregnancy related concerns in females of childbearing age    ED Course:  Patient appears somewhat uncomfortable, but stable vital signs.  No acute distress.  Reports flareup of her endometriosis pain.  States this pain is similar to previous flares, but just not being controlled well with Tylenol and ibuprofen.   Abdomen is mildly tender to palpation in the lower abdomen, but no rebound or guarding. I Ordered, and personally interpreted labs.  The pertinent results include:   CBC, CMP, lipase completely within normal limits Pregnancy negative Upon re-evaluation, patient much more comfortable appearing, states her pain has significantly improved.  I have low concern for intra-abdominal pathology at this time given her labs are normal and she states this pain is consistent with previous endometriosis flares.  She states she does have a ride home. Stable and appropriate for discharge home  Impression: Endometriosis pain  Disposition:  The patient was discharged home with instructions to alternate Tylenol and ibuprofen as needed for pain. A very small course of oxycodone (8 tablets) was provided for breakthrough pain after reviewing PDMP given pain was not able to be controlled  with home medications earlier.  Follow-up with her gynecologist as soon as possible for further management. Return precautions given.   This chart was dictated using voice recognition software, Dragon. Despite the best efforts of this provider to proofread and correct errors, errors may still occur which can change documentation meaning.          Final Clinical Impression(s) / ED Diagnoses Final diagnoses:  Endometriosis    Rx / DC Orders ED Discharge Orders          Ordered    oxyCODONE (ROXICODONE) 5 MG immediate release tablet  Every 6 hours PRN        08/02/23 1950              Arabella Merles, Cordelia Poche 08/02/23 2000    Terald Sleeper, MD 08/02/23 2005

## 2023-08-02 NOTE — ED Triage Notes (Signed)
 Abdominal pain Lower/ suprapubic area Sharp, constant Pain uncontrolled with home meds X few days  Hx endometriosis- "its the same thing every time I have these flare ups"

## 2023-08-02 NOTE — Discharge Instructions (Addendum)
 Your labs are reassuring today.  Your blood counts, electrolyte, kidney, liver, and pancreas labs are normal today.  Your pregnancy test is negative.  You may take up to 1000mg  of tylenol every 6 hours as needed for pain.  Do not take more then 4g per day.  You may use up to 600mg  ibuprofen every 6 hours as needed for pain.  Do not exceed 2.4g of ibuprofen per day.  You are given a dose here similar medication.  Your next dose of ibuprofen can be no sooner than 1 AM.  If needed, you may alternate these medications as below: Take 600mg  ibuprofen, then 3 hours later take 1000mg  tylenol, then 3 hours later 600mg  ibuprofen, then 3 hours later 1000mg  tylenol, so on and so forth for pain control.  You have been prescribed Zofran (ondansetron) for nausea and vomiting. You may take this every 8 hours as needed for nausea and vomiting. This medication dissolves under the tongue. You do not need to swallow it.  You were given a dose here, your next dose can be no sooner than 3 AM.  You have been prescribed Oxycodone-this is a narcotic/controlled substance medication that has potential addicting qualities.  You may take 1 tablet every 6 hours as needed for severe pain not controlled with Tylenol and ibuprofen.  Do not drive or operate heavy machinery when taking this medicine as it can be sedating. Do not drink alcohol or take other sedating medications when taking this medicine for safety reasons.  Keep this out of reach of small children.    Please follow-up with your gynecologist as soon as possible for further management of her endometriosis pain.  Return to the ER for any fevers, uncontrolled abdominal pain, any other new or concerning symptoms.

## 2023-08-11 ENCOUNTER — Encounter (HOSPITAL_BASED_OUTPATIENT_CLINIC_OR_DEPARTMENT_OTHER): Payer: Self-pay | Admitting: Emergency Medicine

## 2023-08-11 ENCOUNTER — Other Ambulatory Visit: Payer: Self-pay

## 2023-08-11 ENCOUNTER — Emergency Department (HOSPITAL_BASED_OUTPATIENT_CLINIC_OR_DEPARTMENT_OTHER)
Admission: EM | Admit: 2023-08-11 | Discharge: 2023-08-11 | Disposition: A | Discharge status: Home / Self Care | Attending: Emergency Medicine | Admitting: Emergency Medicine

## 2023-08-11 DIAGNOSIS — R1030 Lower abdominal pain, unspecified: Secondary | ICD-10-CM | POA: Diagnosis not present

## 2023-08-11 DIAGNOSIS — N809 Endometriosis, unspecified: Secondary | ICD-10-CM | POA: Insufficient documentation

## 2023-08-11 DIAGNOSIS — R42 Dizziness and giddiness: Secondary | ICD-10-CM | POA: Diagnosis not present

## 2023-08-11 LAB — CBC
HCT: 36.1 % (ref 36.0–46.0)
Hemoglobin: 12.5 g/dL (ref 12.0–15.0)
MCH: 29.8 pg (ref 26.0–34.0)
MCHC: 34.6 g/dL (ref 30.0–36.0)
MCV: 86 fL (ref 80.0–100.0)
Platelets: 301 10*3/uL (ref 150–400)
RBC: 4.2 MIL/uL (ref 3.87–5.11)
RDW: 13.1 % (ref 11.5–15.5)
WBC: 5 10*3/uL (ref 4.0–10.5)
nRBC: 0 % (ref 0.0–0.2)

## 2023-08-11 LAB — COMPREHENSIVE METABOLIC PANEL WITH GFR
ALT: 7 U/L (ref 0–44)
AST: 12 U/L — ABNORMAL LOW (ref 15–41)
Albumin: 4.4 g/dL (ref 3.5–5.0)
Alkaline Phosphatase: 43 U/L (ref 38–126)
Anion gap: 7 (ref 5–15)
BUN: 12 mg/dL (ref 6–20)
CO2: 27 mmol/L (ref 22–32)
Calcium: 9.1 mg/dL (ref 8.9–10.3)
Chloride: 103 mmol/L (ref 98–111)
Creatinine, Ser: 0.85 mg/dL (ref 0.44–1.00)
GFR, Estimated: >60 mL/min (ref >60)
Glucose, Bld: 87 mg/dL (ref 70–99)
Potassium: 3.6 mmol/L (ref 3.5–5.1)
Sodium: 137 mmol/L (ref 135–145)
Total Bilirubin: 0.5 mg/dL (ref 0.0–1.2)
Total Protein: 7.7 g/dL (ref 6.5–8.1)

## 2023-08-11 LAB — LIPASE, BLOOD: Lipase: 23 U/L (ref 11–51)

## 2023-08-11 LAB — URINALYSIS, ROUTINE W REFLEX MICROSCOPIC
Bacteria, UA: NONE SEEN
Bilirubin Urine: NEGATIVE
Glucose, UA: NEGATIVE mg/dL
Hgb urine dipstick: NEGATIVE
Ketones, ur: NEGATIVE mg/dL
Nitrite: NEGATIVE
Protein, ur: NEGATIVE mg/dL
Specific Gravity, Urine: 1.013 (ref 1.005–1.030)
pH: 6.5 (ref 5.0–8.0)

## 2023-08-11 LAB — PREGNANCY, URINE: Preg Test, Ur: NEGATIVE

## 2023-08-11 MED ORDER — OXYCODONE-ACETAMINOPHEN 5-325 MG PO TABS
1.0000 | ORAL_TABLET | Freq: Once | ORAL | Status: AC
  Administered 2023-08-11: 1 via ORAL
  Filled 2023-08-11: qty 1

## 2023-08-11 MED ORDER — IBUPROFEN 400 MG PO TABS
600.0000 mg | ORAL_TABLET | Freq: Once | ORAL | Status: AC
  Administered 2023-08-11: 600 mg via ORAL
  Filled 2023-08-11: qty 1

## 2023-08-11 MED ORDER — OXYCODONE HCL 5 MG PO TABS: 5.0000 mg | ORAL_TABLET | Freq: Four times a day (QID) | ORAL | Status: DC | PRN

## 2023-08-11 MED ORDER — OXYCODONE HCL 5 MG PO TABS: 5.0000 mg | ORAL_TABLET | Freq: Four times a day (QID) | ORAL | 0 refills | Status: AC | PRN

## 2023-08-11 NOTE — ED Triage Notes (Signed)
 Took Thc gummy yesterday. Has been feeling dizzines and "off" since yesterday  Also has abdo pain from endometriosis

## 2023-08-11 NOTE — Discharge Instructions (Signed)
 Your kidney, liver, and pancreas labs are normal today.  Your pregnancy test is negative.  Your urine did not show any signs of a urinary tract infection.  Your blood counts and electrolytes are normal today.  You may take up to 1000mg  of tylenol every 6 hours as needed for pain.  Do not take more then 4g per day.  You may use up to 600mg  ibuprofen every 6 hours as needed for pain.  Do not exceed 2.4g of ibuprofen per day.  You were given your first dose here today, your next dose can be no sooner than 9:30 PM tonight  Please follow-up with your OB/GYN as soon as possible for further management of your endometriosis pain.  Return to the ER for any other new or emergent concerns

## 2023-08-11 NOTE — ED Notes (Signed)
 RN reviewed discharge instructions with pt. Pt verbalized understanding and had no further questions

## 2023-08-11 NOTE — ED Provider Notes (Signed)
 [place] EMERGENCY DEPARTMENT AT [facility] Provider Note   CSN: [id number] Arrival date & time: 08/11/23  1239     History  Chief Complaint  Patient presents with   Dizziness   Abdominal Pain    Wanda Nixon is a 33 y.o. female with history of anxiety, endometriosis, presents with concern for lower abdominal pain consistent with her endometriosis flares.  States this has been going on for the past couple of days and has not been relieved with Tylenol and ibuprofen at home.  States she also took a THC gummy to help with the pain, but this made her feel very "off" and did not help with the pain.  Reports she has a follow-up appointment with her OB/GYN in 2 weeks to discuss surgical management for her endometriosis.  Denies any fever, chills, dysuria, hematuria, increased frequency, diarrhea. Reports normal bowel movement   Dizziness Abdominal Pain      Home Medications Prior to Admission medications   Medication Sig Start Date End Date Taking? Authorizing Provider  citalopram (CELEXA) 20 MG tablet Take 1 tablet (20 mg total) by mouth daily. 05/31/23   Omar Bibber, DO  albuterol (VENTOLIN HFA) 108 (90 Base) MCG/ACT inhaler Inhale 2 puffs into the lungs every 6 (six) hours as needed for wheezing or shortness of breath. 05/06/22   Ernestina Headland, MD  clonazePAM (KLONOPIN) 0.5 MG tablet Take 1 tablet (0.5 mg total) by mouth 2 (two) times daily as needed for anxiety. 11/10/22   Genora Kidd, MD  cyclobenzaprine (FLEXERIL) 10 MG tablet Take 1 tablet (10 mg total) by mouth 2 (two) times daily as needed for muscle spasms. 01/26/23   Denese Finn, PA-C  ibuprofen (ADVIL) 800 MG tablet Take 1 tablet (800 mg total) by mouth 3 (three) times daily. 10/22/22   Mesner, Jason, MD  lidocaine (LIDODERM) 5 % Place 1 patch onto the skin daily. Remove & Discard patch within 12 hours or as directed by MD 01/26/23   Denese Finn, PA-C  meclizine (ANTIVERT) 25 MG tablet Take 1 tablet  (25 mg total) by mouth 3 (three) times daily as needed. 01/23/23   Clark, Meghan R, PA-C  ondansetron (ZOFRAN-ODT) 4 MG disintegrating tablet Take 1 tablet (4 mg total) by mouth every 8 (eight) hours as needed for nausea or vomiting. 08/02/23   Rexie Catena, PA-C  senna-docusate (SENOKOT-S) 8.6-50 MG tablet Take 1 tablet by mouth daily. 09/25/22   Kingsley, Victoria K, DO  triamcinolone cream (KENALOG) 0.1 % APPLY TO AFFECTED AREA TWICE A DAY 08/27/22   Edsel Grace, MD  Vitamin D, Ergocalciferol, (DRISDOL) 1.25 MG (50000 UNIT) CAPS capsule TAKE 1 CAPSULE BY MOUTH EVERY 7 DAYS. WHEN YOU RUN OUT OF THESE 12 TABLETS, PLEASE RETURN TO CLINIC FOR VIT. D LEVEL RECHECK. 08/23/22   Edsel Grace, MD      Allergies    Tomato, Augmentin [amoxicillin-pot clavulanate], Mushroom extract complex (obsolete), and Reglan [metoclopramide]    Review of Systems   Review of Systems  Gastrointestinal:  Positive for abdominal pain.  Neurological:  Positive for dizziness.    Physical Exam Updated Vital Signs BP (!) 135/101 (BP Location: Right Arm)   Pulse 77   Temp 97.9 F (36.6 C)   Resp 17   LMP 08/07/2023   SpO2 100%  Physical Exam Vitals and nursing note reviewed.  Constitutional:      General: She is not in acute distress.    Appearance: She is well-developed.  Comments: Well appearing, holding lower abdomen  HENT:     Head: Normocephalic and atraumatic.  Eyes:     Conjunctiva/sclera: Conjunctivae normal.  Cardiovascular:     Rate and Rhythm: Normal rate and regular rhythm.     Heart sounds: No murmur heard. Pulmonary:     Effort: Pulmonary effort is normal. No respiratory distress.     Breath sounds: Normal breath sounds.  Abdominal:     Palpations: Abdomen is soft.     Tenderness: There is no abdominal tenderness.  Musculoskeletal:        General: No swelling.     Cervical back: Neck supple.  Skin:    General: Skin is warm and dry.     Capillary Refill: Capillary refill takes  less than 2 seconds.  Neurological:     Mental Status: She is alert.  Psychiatric:        Mood and Affect: Mood normal.     ED Results / Procedures / Treatments   Labs (all labs ordered are listed, but only abnormal results are displayed) Labs Reviewed  COMPREHENSIVE METABOLIC PANEL WITH GFR - Abnormal; Notable for the following components:      Result Value   AST 12 (*)    All other components within normal limits  URINALYSIS, ROUTINE W REFLEX MICROSCOPIC - Abnormal; Notable for the following components:   Leukocytes,Ua TRACE (*)    All other components within normal limits  LIPASE, BLOOD  CBC  PREGNANCY, URINE    EKG None  Radiology No results found.  Procedures Procedures    Medications Ordered in ED Medications  ibuprofen (ADVIL) tablet 600 mg (600 mg Oral Given 08/11/23 1532)  oxyCODONE-acetaminophen (PERCOCET/ROXICET) 5-325 MG per tablet 1 tablet (1 tablet Oral Given 08/11/23 1532)    ED Course/ Medical Decision Making/ A&P                                 Medical Decision Making Amount and/or Complexity of Data Reviewed Labs: ordered.  Risk Prescription drug management.     Differential diagnosis includes but is not limited to Cholelithiasis, cholangitis, choledocholithiasis, peptic ulcer, gastritis, gastroenteritis, appendicitis, IBS, IBD, DKA, nephrolithiasis, UTI, pyelonephritis, pancreatitis, diverticulitis, mesenteric ischemia, abdominal aortic aneurysm, small bowel obstruction, volvulus, ovarian torsion and pregnancy related concerns in females of childbearing age    ED Course:  Upon initial evaluation, patient is well-appearing, stable vital signs aside for elevated blood pressure of 135/101.  Abdomen is soft nontender upon my evaluation.  She moved around the stretched without difficulty.  No active vomiting.  Labs Ordered: I Ordered, and personally interpreted labs.  The pertinent results include:   CBC within normal limits CMP and lipase  within normal limits Pregnancy negative Urinalysis with trace leukocytes, no nitrites  Medications Given: Percocet and ibuprofen for pain  Upon re-evaluation, patient with pain improved.  Given unremarkable labs, abdomen with no rebound or guarding, and patient describing the pain as exactly similar to her endometriosis pain, do not feel we need additional imaging at this time.  Although urine does show small leukocytes, patient is denying any urinary symptoms, no indication for treatment of possible UTI.  Patient stable and appropriate for discharge home    Impression: Endometriosis pain  Disposition:  The patient was discharged home with instructions to follow-up with her OB/GYN as soon as possible for further management of her endometriosis pain.  May take Tylenol and ibuprofen at home  as needed for pain. Return precautions given.    Record Review: External records from outside source obtained and reviewed including visit for endometriosis pain on 08/02/23 where she was prescribed oxycodone for breakthrough pain and recommended to follow-up with her gynecologist soon as possible     This chart was dictated using voice recognition software, Dragon. Despite the best efforts of this provider to proofread and correct errors, errors may still occur which can change documentation meaning.          Final Clinical Impression(s) / ED Diagnoses Final diagnoses:  Endometriosis    Rx / DC Orders ED Discharge Orders     None         Rexie Catena, PA-C 08/11/23 1541    Mozell Arias, MD 08/12/23 1441

## 2023-08-11 NOTE — Progress Notes (Signed)
 Pt called after being seen in the ED for pain R/T endometriosis. Pt requested pain med until she is seen in the office for management. Rx sent and pt to follow-up in office next week.   Rhea Pink, DNP, CNM 08/11/2023 4:42 PM

## 2023-08-11 NOTE — ED Notes (Signed)
 Pt voicing concerns regarding pain management at home. Marchelle Folks PA made aware, PA to speak with patient at bedside

## 2023-08-25 ENCOUNTER — Emergency Department (HOSPITAL_BASED_OUTPATIENT_CLINIC_OR_DEPARTMENT_OTHER)
Admission: EM | Admit: 2023-08-25 | Discharge: 2023-08-25 | Disposition: A | Discharge status: Home / Self Care | Attending: Emergency Medicine | Admitting: Emergency Medicine

## 2023-08-25 ENCOUNTER — Other Ambulatory Visit: Payer: Self-pay

## 2023-08-25 ENCOUNTER — Encounter (HOSPITAL_BASED_OUTPATIENT_CLINIC_OR_DEPARTMENT_OTHER): Payer: Self-pay

## 2023-08-25 DIAGNOSIS — G8929 Other chronic pain: Secondary | ICD-10-CM | POA: Insufficient documentation

## 2023-08-25 DIAGNOSIS — R102 Pelvic and perineal pain: Secondary | ICD-10-CM | POA: Diagnosis not present

## 2023-08-25 DIAGNOSIS — Z79899 Other long term (current) drug therapy: Secondary | ICD-10-CM | POA: Diagnosis not present

## 2023-08-25 DIAGNOSIS — N809 Endometriosis, unspecified: Secondary | ICD-10-CM | POA: Insufficient documentation

## 2023-08-25 LAB — URINALYSIS, ROUTINE W REFLEX MICROSCOPIC
Bacteria, UA: NONE SEEN
Bilirubin Urine: NEGATIVE
Glucose, UA: NEGATIVE mg/dL
Hgb urine dipstick: NEGATIVE
Ketones, ur: NEGATIVE mg/dL
Nitrite: NEGATIVE
Protein, ur: 30 mg/dL — AB
Specific Gravity, Urine: 1.029 (ref 1.005–1.030)
pH: 7.5 (ref 5.0–8.0)

## 2023-08-25 LAB — COMPREHENSIVE METABOLIC PANEL WITH GFR
ALT: 8 U/L (ref 0–44)
AST: 19 U/L (ref 15–41)
Albumin: 4.6 g/dL (ref 3.5–5.0)
Alkaline Phosphatase: 53 U/L (ref 38–126)
Anion gap: 15 (ref 5–15)
BUN: 13 mg/dL (ref 6–20)
CO2: 19 mmol/L — ABNORMAL LOW (ref 22–32)
Calcium: 9.5 mg/dL (ref 8.9–10.3)
Chloride: 104 mmol/L (ref 98–111)
Creatinine, Ser: 0.81 mg/dL (ref 0.44–1.00)
GFR, Estimated: >60 mL/min (ref >60)
Glucose, Bld: 73 mg/dL (ref 70–99)
Potassium: 4 mmol/L (ref 3.5–5.1)
Sodium: 138 mmol/L (ref 135–145)
Total Bilirubin: 0.3 mg/dL (ref 0.0–1.2)
Total Protein: 7.8 g/dL (ref 6.5–8.1)

## 2023-08-25 LAB — CBC
HCT: 38.6 % (ref 36.0–46.0)
Hemoglobin: 13.5 g/dL (ref 12.0–15.0)
MCH: 29.8 pg (ref 26.0–34.0)
MCHC: 35 g/dL (ref 30.0–36.0)
MCV: 85.2 fL (ref 80.0–100.0)
Platelets: 349 10*3/uL (ref 150–400)
RBC: 4.53 MIL/uL (ref 3.87–5.11)
RDW: 12.9 % (ref 11.5–15.5)
WBC: 5.4 10*3/uL (ref 4.0–10.5)
nRBC: 0 % (ref 0.0–0.2)

## 2023-08-25 LAB — PREGNANCY, URINE: Preg Test, Ur: NEGATIVE

## 2023-08-25 LAB — LIPASE, BLOOD: Lipase: 22 U/L (ref 11–51)

## 2023-08-25 MED ORDER — OXYCODONE-ACETAMINOPHEN 5-325 MG PO TABS
1.0000 | ORAL_TABLET | ORAL | Status: AC | PRN
  Administered 2023-08-25 (×2): 1 via ORAL
  Filled 2023-08-25 (×2): qty 1

## 2023-08-25 MED ORDER — KETOROLAC TROMETHAMINE 15 MG/ML IJ SOLN
15.0000 mg | Freq: Once | INTRAMUSCULAR | Status: AC
  Administered 2023-08-25: 15 mg via INTRAMUSCULAR
  Filled 2023-08-25: qty 1

## 2023-08-25 MED ORDER — ONDANSETRON 4 MG PO TBDP
4.0000 mg | ORAL_TABLET | Freq: Once | ORAL | Status: AC
  Administered 2023-08-25: 4 mg via ORAL
  Filled 2023-08-25: qty 1

## 2023-08-25 NOTE — ED Triage Notes (Addendum)
 Patient arrives with complaints of worsening abdominal pain related to endometriosis. Patient also reports having issues getting an appointment with her OBGYB (may need a referral to a new one).

## 2023-08-25 NOTE — ED Provider Notes (Signed)
 [place] EMERGENCY DEPARTMENT AT [facility] Provider Note   CSN: [id number] Arrival date & time: 08/25/23  1857     History  Chief Complaint  Patient presents with   Abdominal Pain    Wanda Nixon is a 33 y.o. female.  Patient with past medical history of generalized anxiety disorder, migraine, pelvic pain, endometriosis presenting to the emergency room with complaint of pelvic pain.  Patient reports that for the past 6 weeks she has been dealing with intermittent cramping she says this feels like her typical endometriosis and think she is having a flareup.  Reports she has been taking Tylenol  and ibuprofen  without significant improvement.  She does report she has been tolerating oral intake and has not had any fever, urinary symptoms or vaginal discharge.  Reports that her last period was 3 weeks ago around the time when she first came into the ER for this.  Reports she was discharged home with some stronger pain medicine that she has become reliant on because Tylenol  and ibuprofen  did not seem to help. Not sexually active. Dose not have OBGYN. Denies chest pain, shortness of breath, cough, vomit or diarrhea.    Abdominal Pain      Home Medications Prior to Admission medications   Medication Sig Start Date End Date Taking? Authorizing Provider  citalopram  (CELEXA ) 20 MG tablet Take 1 tablet (20 mg total) by mouth daily. 05/31/23   Omar Bibber, DO  albuterol  (VENTOLIN  HFA) 108 (90 Base) MCG/ACT inhaler Inhale 2 puffs into the lungs every 6 (six) hours as needed for wheezing or shortness of breath. 05/06/22   Ernestina Headland, MD  clonazePAM  (KLONOPIN ) 0.5 MG tablet Take 1 tablet (0.5 mg total) by mouth 2 (two) times daily as needed for anxiety. 11/10/22   Genora Kidd, MD  cyclobenzaprine  (FLEXERIL ) 10 MG tablet Take 1 tablet (10 mg total) by mouth 2 (two) times daily as needed for muscle spasms. 01/26/23   Denese Finn, PA-C  ibuprofen  (ADVIL ) 800 MG tablet Take 1  tablet (800 mg total) by mouth 3 (three) times daily. 10/22/22   Mesner, Reymundo Caulk, MD  lidocaine  (LIDODERM ) 5 % Place 1 patch onto the skin daily. Remove & Discard patch within 12 hours or as directed by MD 01/26/23   Denese Finn, PA-C  meclizine  (ANTIVERT ) 25 MG tablet Take 1 tablet (25 mg total) by mouth 3 (three) times daily as needed. 01/23/23   Clark, Meghan R, PA-C  ondansetron  (ZOFRAN -ODT) 4 MG disintegrating tablet Take 1 tablet (4 mg total) by mouth every 8 (eight) hours as needed for nausea or vomiting. 08/02/23   Rexie Catena, PA-C  senna-docusate (SENOKOT-S) 8.6-50 MG tablet Take 1 tablet by mouth daily. 09/25/22   Kingsley, Victoria K, DO  triamcinolone  cream (KENALOG ) 0.1 % APPLY TO AFFECTED AREA TWICE A DAY 08/27/22   Edsel Grace, MD  Vitamin D , Ergocalciferol , (DRISDOL ) 1.25 MG (50000 UNIT) CAPS capsule TAKE 1 CAPSULE BY MOUTH EVERY 7 DAYS. WHEN YOU RUN OUT OF THESE 12 TABLETS, PLEASE RETURN TO CLINIC FOR VIT. D LEVEL RECHECK. 08/23/22   Edsel Grace, MD      Allergies    Tomato, Augmentin  [amoxicillin -pot clavulanate], Mushroom extract complex (obsolete), and Reglan  [metoclopramide ]    Review of Systems   Review of Systems  Gastrointestinal:  Positive for abdominal pain.    Physical Exam Updated Vital Signs BP 117/87   Pulse 75   Temp 98 F (36.7 C) (Oral)   Resp 20   Ht  4\' 11"  (1.499 m)   Wt 57.6 kg   LMP 08/07/2023   SpO2 100%   BMI 25.65 kg/m  Physical Exam Vitals and nursing note reviewed.  Constitutional:      General: She is not in acute distress.    Appearance: She is not toxic-appearing.  HENT:     Head: Normocephalic and atraumatic.  Eyes:     General: No scleral icterus.    Conjunctiva/sclera: Conjunctivae normal.  Cardiovascular:     Rate and Rhythm: Normal rate and regular rhythm.     Pulses: Normal pulses.     Heart sounds: Normal heart sounds.  Pulmonary:     Effort: Pulmonary effort is normal. No respiratory distress.     Breath  sounds: Normal breath sounds.  Abdominal:     General: Abdomen is flat. Bowel sounds are normal. There is no distension.     Palpations: Abdomen is soft. There is no mass.     Tenderness: There is no abdominal tenderness. There is no right CVA tenderness or left CVA tenderness.  Musculoskeletal:     Right lower leg: No edema.     Left lower leg: No edema.  Skin:    General: Skin is warm and dry.     Findings: No lesion.  Neurological:     General: No focal deficit present.     Mental Status: She is alert and oriented to person, place, and time. Mental status is at baseline.     ED Results / Procedures / Treatments   Labs (all labs ordered are listed, but only abnormal results are displayed) Labs Reviewed  COMPREHENSIVE METABOLIC PANEL WITH GFR - Abnormal; Notable for the following components:      Result Value   CO2 19 (*)    All other components within normal limits  URINALYSIS, ROUTINE W REFLEX MICROSCOPIC - Abnormal; Notable for the following components:   Protein, ur 30 (*)    Leukocytes,Ua TRACE (*)    All other components within normal limits  LIPASE, BLOOD  CBC  PREGNANCY, URINE    EKG None  Radiology No results found.  Procedures Procedures    Medications Ordered in ED Medications  oxyCODONE -acetaminophen  (PERCOCET/ROXICET) 5-325 MG per tablet 1 tablet (1 tablet Oral Given 08/25/23 2249)  ondansetron  (ZOFRAN -ODT) disintegrating tablet 4 mg (4 mg Oral Given 08/25/23 2248)  ketorolac  (TORADOL ) 15 MG/ML injection 15 mg (15 mg Intramuscular Given 08/25/23 2257)    ED Course/ Medical Decision Making/ A&P                                 Medical Decision Making Amount and/or Complexity of Data Reviewed Labs: ordered.  Risk Prescription drug management.   This patient presents to the ED for concern of abdominal, this involves an extensive number of treatment options, and is a complaint that carries with it a high risk of complications and morbidity.  The  differential diagnosis includes sinusitis, cholecystitis, pancreatitis, hepatitis, gastroenteritis, endometriosis, STD, PID, pelvic ovarian abscess, torsion   Co morbidities that complicate the patient evaluation  Realize anxiety disorder, pelvic pain, fibroid, endometriosis    Additional history obtained:  Additional history obtained from ED visit 08/02/2023, 08/11/2023 for similar complaint   Lab Tests:  I personally interpreted labs.  The pertinent results include:   CBC without leukocytosis.  Hemoglobin is 13.5, CMP without significant electrolyte normality and normal liver and kidney function.  UA with trace  leukocytosis however no urinary symptoms. Lipase negative. Upreg negative.    Imaging Studies ordered:  Considered, however patient declined imaging although offered.    Cardiac Monitoring: / EKG:  The patient was maintained on a cardiac monitor.    Problem List / ED Course / Critical interventions / Medication management  Reporting to emergency room with complaint of abdominal pain.  She does have a history of chronic pelvic pain due to endometriosis.  She is hemodynamically stable and well-appearing she is tolerating oral intake no fever on my exam her abdomen is soft nondistended.  It is diffusely tender to palpation.  No obvious hernia.  She has no CVA tenderness.  Lungs clear to auscultation.  Patient is requesting stronger pain medicine as Tylenol  and ibuprofen  have not been working for her.  She does not have OB follow-up and is also requesting resources for a new OB.  She reports that she has been having some problem with many and insurance and this has delayed her follow-up.  I did offer imaging to further characterize pain given this is her third visit however patient declines.  I offered CT scan versus pelvic ultrasound and she still declined.  Her labs are overall reassuring.  She has had several visits in which she has been prescribed narcotics for this.  Feel that  her pain is under control now and she does not need to go home with narcotics.  Will have her follow-up with OB/GYN given return precautions.  Reevaluation of the patient after these medicines showed that the patient improved I have reviewed the patients home medicines and have made adjustments as needed   Plan  F/u w/ PCP in 2-3d to ensure resolution of sx.  Patient was given return precautions. Patient stable for discharge at this time.  Patient educated on sx/dx and verbalized understanding of plan. Return to ER w/ new or worsening sx.          Final Clinical Impression(s) / ED Diagnoses Final diagnoses:  Endometriosis    Rx / DC Orders ED Discharge Orders     None         Suzanne Erps Allison Ivory 08/25/23 2316    Tonya Fredrickson, MD 08/26/23 1143

## 2023-08-25 NOTE — Discharge Instructions (Addendum)
 I would recommend taking naproxen  once in the morning and once in the evening 2 times a day.  You can also take Tylenol  1000 mg every 6 hours.  Use ice and heat over area of pain.

## 2023-08-27 ENCOUNTER — Emergency Department (HOSPITAL_BASED_OUTPATIENT_CLINIC_OR_DEPARTMENT_OTHER)

## 2023-08-27 ENCOUNTER — Other Ambulatory Visit: Payer: Self-pay

## 2023-08-27 ENCOUNTER — Emergency Department (HOSPITAL_BASED_OUTPATIENT_CLINIC_OR_DEPARTMENT_OTHER): Admission: EM | Admit: 2023-08-27 | Discharge: 2023-08-27 | Disposition: A | Discharge status: Home / Self Care

## 2023-08-27 DIAGNOSIS — K5900 Constipation, unspecified: Secondary | ICD-10-CM | POA: Diagnosis not present

## 2023-08-27 DIAGNOSIS — R1031 Right lower quadrant pain: Secondary | ICD-10-CM | POA: Diagnosis not present

## 2023-08-27 DIAGNOSIS — N809 Endometriosis, unspecified: Secondary | ICD-10-CM | POA: Insufficient documentation

## 2023-08-27 LAB — BASIC METABOLIC PANEL WITH GFR
Anion gap: 11 (ref 5–15)
BUN: 13 mg/dL (ref 6–20)
CO2: 23 mmol/L (ref 22–32)
Calcium: 9.5 mg/dL (ref 8.9–10.3)
Chloride: 107 mmol/L (ref 98–111)
Creatinine, Ser: 0.8 mg/dL (ref 0.44–1.00)
GFR, Estimated: >60 mL/min (ref >60)
Glucose, Bld: 81 mg/dL (ref 70–99)
Potassium: 4.1 mmol/L (ref 3.5–5.1)
Sodium: 141 mmol/L (ref 135–145)

## 2023-08-27 LAB — CBC
HCT: 35.9 % — ABNORMAL LOW (ref 36.0–46.0)
Hemoglobin: 12.4 g/dL (ref 12.0–15.0)
MCH: 29.2 pg (ref 26.0–34.0)
MCHC: 34.5 g/dL (ref 30.0–36.0)
MCV: 84.5 fL (ref 80.0–100.0)
Platelets: 326 10*3/uL (ref 150–400)
RBC: 4.25 MIL/uL (ref 3.87–5.11)
RDW: 12.9 % (ref 11.5–15.5)
WBC: 5.5 10*3/uL (ref 4.0–10.5)
nRBC: 0 % (ref 0.0–0.2)

## 2023-08-27 LAB — WET PREP, GENITAL
Clue Cells Wet Prep HPF POC: NONE SEEN
Sperm: NONE SEEN
Trich, Wet Prep: NONE SEEN
WBC, Wet Prep HPF POC: <10 (ref <10)
Yeast Wet Prep HPF POC: NONE SEEN

## 2023-08-27 MED ORDER — OXYCODONE-ACETAMINOPHEN 5-325 MG PO TABS
1.0000 | ORAL_TABLET | Freq: Once | ORAL | Status: AC
  Administered 2023-08-27: 1 via ORAL
  Filled 2023-08-27: qty 1

## 2023-08-27 MED ORDER — OXYCODONE-ACETAMINOPHEN 5-325 MG PO TABS
1.0000 | ORAL_TABLET | Freq: Four times a day (QID) | ORAL | 0 refills | Status: DC | PRN
Start: 2023-08-27 — End: 2023-08-31

## 2023-08-27 MED ORDER — MORPHINE SULFATE (PF) 2 MG/ML IV SOLN
2.0000 mg | Freq: Once | INTRAVENOUS | Status: DC
  Filled 2023-08-27: qty 1

## 2023-08-27 MED ORDER — IOHEXOL 300 MG/ML  SOLN
100.0000 mL | Freq: Once | INTRAMUSCULAR | Status: AC | PRN
  Administered 2023-08-27: 100 mL via INTRAVENOUS

## 2023-08-27 MED ORDER — ONDANSETRON HCL 4 MG PO TABS: 4.0000 mg | ORAL_TABLET | Freq: Four times a day (QID) | ORAL | 0 refills | Status: DC

## 2023-08-27 MED ORDER — ONDANSETRON HCL 4 MG/2ML IJ SOLN
4.0000 mg | Freq: Once | INTRAMUSCULAR | Status: AC
  Administered 2023-08-27: 4 mg via INTRAVENOUS
  Filled 2023-08-27: qty 2

## 2023-08-27 MED ORDER — KETOROLAC TROMETHAMINE 15 MG/ML IJ SOLN
15.0000 mg | Freq: Once | INTRAMUSCULAR | Status: AC
  Administered 2023-08-27: 15 mg via INTRAVENOUS
  Filled 2023-08-27: qty 1

## 2023-08-27 NOTE — ED Triage Notes (Signed)
 Pt reports sharp suprapubic pain r/t an endometriosis flare up she was seen in ED for 2 days ago.  Pt reports nausea and pain that radiates to her mid lower back.  [id number]

## 2023-08-27 NOTE — ED Provider Notes (Signed)
 [place] EMERGENCY DEPARTMENT AT [facility] Provider Note   CSN: [id number] Arrival date & time: 08/27/23  1751     History  Chief Complaint  Patient presents with   Abdominal Pain    CHELSY CHOUDHURY is a 33 y.o. female.  33 year old female presents again for lower abdominal pain that she states has been intermittent for the past several years consistent with her endometriosis.  Seemingly worsened over the past couple weeks.  She has been seen several times the past few days in the emergency department, reports that continues to have pain today.  Is working on getting an OB.  Reports some nausea, no vomiting.  Having normal bowel movements.   Abdominal Pain      Home Medications Prior to Admission medications   Medication Sig Start Date End Date Taking? Authorizing Provider  citalopram  (CELEXA ) 20 MG tablet Take 1 tablet (20 mg total) by mouth daily. 05/31/23   Omar Bibber, DO  albuterol  (VENTOLIN  HFA) 108 (90 Base) MCG/ACT inhaler Inhale 2 puffs into the lungs every 6 (six) hours as needed for wheezing or shortness of breath. 05/06/22   Ernestina Headland, MD  clonazePAM  (KLONOPIN ) 0.5 MG tablet Take 1 tablet (0.5 mg total) by mouth 2 (two) times daily as needed for anxiety. 11/10/22   Genora Kidd, MD  cyclobenzaprine  (FLEXERIL ) 10 MG tablet Take 1 tablet (10 mg total) by mouth 2 (two) times daily as needed for muscle spasms. 01/26/23   Denese Finn, PA-C  ibuprofen  (ADVIL ) 800 MG tablet Take 1 tablet (800 mg total) by mouth 3 (three) times daily. 10/22/22   Mesner, Reymundo Caulk, MD  lidocaine  (LIDODERM ) 5 % Place 1 patch onto the skin daily. Remove & Discard patch within 12 hours or as directed by MD 01/26/23   Denese Finn, PA-C  meclizine  (ANTIVERT ) 25 MG tablet Take 1 tablet (25 mg total) by mouth 3 (three) times daily as needed. 01/23/23   Clark, Meghan R, PA-C  ondansetron  (ZOFRAN -ODT) 4 MG disintegrating tablet Take 1 tablet (4 mg total) by mouth every 8 (eight)  hours as needed for nausea or vomiting. 08/02/23   Rexie Catena, PA-C  senna-docusate (SENOKOT-S) 8.6-50 MG tablet Take 1 tablet by mouth daily. 09/25/22   Kingsley, Victoria K, DO  triamcinolone  cream (KENALOG ) 0.1 % APPLY TO AFFECTED AREA TWICE A DAY 08/27/22   Edsel Grace, MD  Vitamin D , Ergocalciferol , (DRISDOL ) 1.25 MG (50000 UNIT) CAPS capsule TAKE 1 CAPSULE BY MOUTH EVERY 7 DAYS. WHEN YOU RUN OUT OF THESE 12 TABLETS, PLEASE RETURN TO CLINIC FOR VIT. D LEVEL RECHECK. 08/23/22   Edsel Grace, MD      Allergies    Tomato, Augmentin  [amoxicillin -pot clavulanate], Mushroom extract complex (obsolete), and Reglan  [metoclopramide ]    Review of Systems   Review of Systems  Gastrointestinal:  Positive for abdominal pain.    Physical Exam Updated Vital Signs BP 104/68 (BP Location: Right Arm)   Pulse 90   Temp 98 F (36.7 C) (Oral)   Resp 18   LMP 08/07/2023   SpO2 100%  Physical Exam Vitals and nursing note reviewed.  Constitutional:      General: She is not in acute distress.    Comments: Uncomfortable appearing  Cardiovascular:     Rate and Rhythm: Normal rate and regular rhythm.  Pulmonary:     Effort: Pulmonary effort is normal.     Breath sounds: Normal breath sounds.  Abdominal:     Palpations: Abdomen is soft.  Tenderness: There is abdominal tenderness in the right lower quadrant, suprapubic area and left lower quadrant. There is no guarding or rebound.  Skin:    General: Skin is warm and dry.  Neurological:     Mental Status: She is alert.     ED Results / Procedures / Treatments   Labs (all labs ordered are listed, but only abnormal results are displayed) Labs Reviewed  CBC - Abnormal; Notable for the following components:      Result Value   HCT 35.9 (*)    All other components within normal limits  WET PREP, GENITAL  BASIC METABOLIC PANEL WITH GFR    EKG None  Radiology CT ABDOMEN PELVIS W CONTRAST Result Date: 08/27/2023 CLINICAL DATA:   Right lower quadrant abdominal pain history of endometriosis, nausea, lower back pain EXAM: CT ABDOMEN AND PELVIS WITH CONTRAST TECHNIQUE: Multidetector CT imaging of the abdomen and pelvis was performed using the standard protocol following bolus administration of intravenous contrast. RADIATION DOSE REDUCTION: This exam was performed according to the departmental dose-optimization program which includes automated exposure control, adjustment of the mA and/or kV according to patient size and/or use of iterative reconstruction technique. CONTRAST:  100mL OMNIPAQUE IOHEXOL 300 MG/ML  SOLN COMPARISON:  None Available. FINDINGS: Lower chest: No acute pleural or parenchymal lung disease. Hepatobiliary: No focal liver abnormality is seen. No gallstones, gallbladder wall thickening, or biliary dilatation. Pancreas: Unremarkable. No pancreatic ductal dilatation or surrounding inflammatory changes. Spleen: Normal in size without focal abnormality. Adrenals/Urinary Tract: Adrenal glands are unremarkable. Kidneys are normal, without renal calculi, focal lesion, or hydronephrosis. Bladder is unremarkable. Stomach/Bowel: No bowel obstruction or ileus. Moderate retained stool throughout the colon consistent with constipation. Normal appendix right lower quadrant. No bowel wall thickening or inflammatory change. Vascular/Lymphatic: No significant vascular findings are present. No enlarged abdominal or pelvic lymph nodes. Reproductive: Uterus and bilateral adnexa are unremarkable. Other: Trace pelvic free fluid, likely physiologic. No free intraperitoneal gas. No abdominal wall hernia. Musculoskeletal: No acute or destructive bony abnormalities. Reconstructed images demonstrate no additional findings. IMPRESSION: 1. Trace pelvic free fluid, likely physiologic. 2. Moderate retained stool throughout the colon consistent with constipation. No bowel obstruction or ileus. 3. Normal appendix right lower quadrant. Electronically Signed    By: Bobbye Burrow M.D.   On: 08/27/2023 20:59    Procedures Procedures    Medications Ordered in ED Medications  oxyCODONE -acetaminophen  (PERCOCET/ROXICET) 5-325 MG per tablet 1 tablet (has no administration in time range)  ketorolac  (TORADOL ) 15 MG/ML injection 15 mg (15 mg Intravenous Given 08/27/23 1941)  iohexol (OMNIPAQUE) 300 MG/ML solution 100 mL (100 mLs Intravenous Contrast Given 08/27/23 1952)  ondansetron  (ZOFRAN ) injection 4 mg (4 mg Intravenous Given 08/27/23 2059)    ED Course/ Medical Decision Making/ A&P Clinical Course as of 08/27/23 2125  Sun Aug 27, 2023  2102 CT ABDOMEN PELVIS W CONTRAST IMPRESSION: 1. Trace pelvic free fluid, likely physiologic. 2. Moderate retained stool throughout the colon consistent with constipation. No bowel obstruction or ileus. 3. Normal appendix right lower quadrant.   Electronically Signed   [TY]    Clinical Course User Index [TY] Rolinda Climes, DO                                 Medical Decision Making This 33 year old female presenting emergency department for abdominal pain.  Per chart review has been seen several times for similar type pain.  Reports  pain is largely unchanged which is is persistent.  Consistent with her prior endometriosis type pain.  She is afebrile nontachycardic, normotensive.  Has some right lower quadrant tenderness.  Does not appear that she has had advanced imaging since the worsening of her symptoms.  Repeat labs today with no leukocytosis.  Basic metabolic panel with no electrolyte abnormalities.  Normal kidney function.  She had normal LFTs 2 days ago.  Lipase is normal.  Negative pregnancy test 2 days ago.  Wet prep negative for acute pathology.  She has low suspicion for STI.  Given right lower quadrant concern for possible appendicitis CT scan obtained.  No acute surgical pathology, but did show significant constipation.  Patient stable for discharge.  Amount and/or Complexity of Data  Reviewed Labs: ordered. Radiology: ordered. Decision-making details documented in ED Course.  Risk Prescription drug management.          Final Clinical Impression(s) / ED Diagnoses Final diagnoses:  None    Rx / DC Orders ED Discharge Orders     None         Rolinda Climes, DO 08/27/23 2125

## 2023-08-27 NOTE — Discharge Instructions (Signed)
 Please follow-up with your primary doctor and with OB/GYN as planned.  You may take over-the-counter medications such as Tylenol , ibuprofen  baseline pain control.  May use the narcotics that we are prescribing for breakthrough pain.  It also appears that you are constipated, please take MiraLAX  as we discussed

## 2023-08-31 ENCOUNTER — Other Ambulatory Visit: Payer: Self-pay

## 2023-08-31 ENCOUNTER — Encounter (HOSPITAL_BASED_OUTPATIENT_CLINIC_OR_DEPARTMENT_OTHER): Payer: Self-pay

## 2023-08-31 ENCOUNTER — Emergency Department (HOSPITAL_BASED_OUTPATIENT_CLINIC_OR_DEPARTMENT_OTHER)
Admission: EM | Admit: 2023-08-31 | Discharge: 2023-08-31 | Disposition: A | Discharge status: Home / Self Care | Attending: Emergency Medicine | Admitting: Emergency Medicine

## 2023-08-31 DIAGNOSIS — R110 Nausea: Secondary | ICD-10-CM | POA: Diagnosis not present

## 2023-08-31 DIAGNOSIS — R102 Pelvic and perineal pain: Secondary | ICD-10-CM | POA: Diagnosis not present

## 2023-08-31 MED ORDER — OXYCODONE-ACETAMINOPHEN 5-325 MG PO TABS
1.0000 | ORAL_TABLET | Freq: Once | ORAL | Status: AC
  Administered 2023-08-31: 1 via ORAL
  Filled 2023-08-31: qty 1

## 2023-08-31 MED ORDER — OXYCODONE-ACETAMINOPHEN 5-325 MG PO TABS: 1.0000 | ORAL_TABLET | Freq: Four times a day (QID) | ORAL | 0 refills | Status: DC | PRN

## 2023-08-31 MED ORDER — KETOROLAC TROMETHAMINE 30 MG/ML IJ SOLN
30.0000 mg | Freq: Once | INTRAMUSCULAR | Status: AC
  Administered 2023-08-31: 30 mg via INTRAMUSCULAR
  Filled 2023-08-31: qty 1

## 2023-08-31 NOTE — ED Triage Notes (Signed)
 Patient arrives POV with complaints of worsening pelvic/abdominal pain related to her endometriosis. Rates pain a 10/10.  Scheduled to see her OBGYN within the next week.

## 2023-08-31 NOTE — Discharge Instructions (Signed)
 Please read and follow all provided instructions.  Your diagnoses today include:  1. Pelvic pain     Tests performed today include: Vital signs. See below for your results today.   Medications prescribed:  Percocet (oxycodone /acetaminophen ) - narcotic pain medication  DO NOT drive or perform any activities that require you to be awake and alert because this medicine can make you drowsy. BE VERY CAREFUL not to take multiple medicines containing Tylenol  (also called acetaminophen ). Doing so can lead to an overdose which can damage your liver and cause liver failure and possibly death.  Take any prescribed medications only as directed.  Home care instructions:  Follow any educational materials contained in this packet.  Follow-up instructions: Please follow-up with your OB/GYN as planned.   Return instructions:  SEEK IMMEDIATE MEDICAL ATTENTION IF: The pain does not go away or becomes severe  A temperature above 101F develops  Repeated vomiting occurs (multiple episodes)  The pain becomes localized to portions of the abdomen. The right side could possibly be appendicitis. In an adult, the left lower portion of the abdomen could be colitis or diverticulitis.  Blood is being passed in stools or vomit (bright red or black tarry stools)  You develop chest pain, difficulty breathing, dizziness or fainting, or become confused, poorly responsive, or inconsolable (young children) If you have any other emergent concerns regarding your health  Additional Information: Abdominal (belly) pain can be caused by many things. Your caregiver performed an examination and possibly ordered blood/urine tests and imaging (CT scan, x-rays, ultrasound). Many cases can be observed and treated at home after initial evaluation in the emergency department. Even though you are being discharged home, abdominal pain can be unpredictable. Therefore, you need a repeated exam if your pain does not resolve, returns, or  worsens. Most patients with abdominal pain don't have to be admitted to the hospital or have surgery, but serious problems like appendicitis and gallbladder attacks can start out as nonspecific pain. Many abdominal conditions cannot be diagnosed in one visit, so follow-up evaluations are very important.  Your vital signs today were: BP (!) 155/95   Pulse 92   Temp 98.5 F (36.9 C) (Oral)   Resp 20   Ht 4\' 11"  (1.499 m)   Wt 57.6 kg   LMP 08/31/2023   SpO2 100%   BMI 25.65 kg/m  If your blood pressure (bp) was elevated above 135/85 this visit, please have this repeated by your doctor within one month. --------------

## 2023-08-31 NOTE — ED Provider Notes (Signed)
 [place] EMERGENCY DEPARTMENT AT [facility] Provider Note   CSN: [id number] Arrival date & time: 08/31/23  1618     History  Chief Complaint  Patient presents with   Pelvic Pain   Nausea    Wanda Nixon is a 33 y.o. female.  Patient with history of salpingectomy, endometriosis -- presents to the emergency department for ongoing pelvic pain.  Patient has had several ED visits over the past month for similar.  Her menstrual period started today, causing her to have more severe pain.  She was seen in the emergency department last on 08/27/23.  She had a CT scan at that time which did not show any acute concerning abnormalities.  Wet prep was normal.  She reports no current sexual activity.  She has a follow-up appointment with OB/GYN on 09/05/2023.  Pain today is consistent with her previous symptoms.  After last visit she had a small prescription for oxycodone  which helped, she took, but now is out.  She states that OB/GYN and her are considering treatment with hysterectomy.  Bleeding is typical.  She denies fevers, chest pain, shortness of breath.  No urinary symptoms.      Home Medications Prior to Admission medications   Medication Sig Start Date End Date Taking? Authorizing Provider  citalopram  (CELEXA ) 20 MG tablet Take 1 tablet (20 mg total) by mouth daily. 05/31/23   Omar Bibber, DO  albuterol  (VENTOLIN  HFA) 108 (90 Base) MCG/ACT inhaler Inhale 2 puffs into the lungs every 6 (six) hours as needed for wheezing or shortness of breath. 05/06/22   Ernestina Headland, MD  clonazePAM  (KLONOPIN ) 0.5 MG tablet Take 1 tablet (0.5 mg total) by mouth 2 (two) times daily as needed for anxiety. 11/10/22   Genora Kidd, MD  cyclobenzaprine  (FLEXERIL ) 10 MG tablet Take 1 tablet (10 mg total) by mouth 2 (two) times daily as needed for muscle spasms. 01/26/23   Denese Finn, PA-C  ibuprofen  (ADVIL ) 800 MG tablet Take 1 tablet (800 mg total) by mouth 3 (three) times daily. 10/22/22    Mesner, Reymundo Caulk, MD  lidocaine  (LIDODERM ) 5 % Place 1 patch onto the skin daily. Remove & Discard patch within 12 hours or as directed by MD 01/26/23   Denese Finn, PA-C  meclizine  (ANTIVERT ) 25 MG tablet Take 1 tablet (25 mg total) by mouth 3 (three) times daily as needed. 01/23/23   Clark, Meghan R, PA-C  ondansetron  (ZOFRAN ) 4 MG tablet Take 1 tablet (4 mg total) by mouth every 6 (six) hours. 08/27/23   Rolinda Climes, DO  ondansetron  (ZOFRAN -ODT) 4 MG disintegrating tablet Take 1 tablet (4 mg total) by mouth every 8 (eight) hours as needed for nausea or vomiting. 08/02/23   Franaszek, Amanda, PA-C  oxyCODONE -acetaminophen  (PERCOCET/ROXICET) 5-325 MG tablet Take 1 tablet by mouth every 6 (six) hours as needed for severe pain (pain score 7-10). 08/27/23   Rolinda Climes, DO  senna-docusate (SENOKOT-S) 8.6-50 MG tablet Take 1 tablet by mouth daily. 09/25/22   Kingsley, Victoria K, DO  triamcinolone  cream (KENALOG ) 0.1 % APPLY TO AFFECTED AREA TWICE A DAY 08/27/22   Edsel Grace, MD  Vitamin D , Ergocalciferol , (DRISDOL ) 1.25 MG (50000 UNIT) CAPS capsule TAKE 1 CAPSULE BY MOUTH EVERY 7 DAYS. WHEN YOU RUN OUT OF THESE 12 TABLETS, PLEASE RETURN TO CLINIC FOR VIT. D LEVEL RECHECK. 08/23/22   Edsel Grace, MD      Allergies    Tomato, Augmentin  [amoxicillin -pot clavulanate], Mushroom extract complex (obsolete),  and Reglan  [metoclopramide ]    Review of Systems   Review of Systems  Physical Exam Updated Vital Signs BP (!) 155/95   Pulse 92   Temp 98.5 F (36.9 C) (Oral)   Resp 20   Ht 4\' 11"  (1.499 m)   Wt 57.6 kg   LMP 08/31/2023   SpO2 100%   BMI 25.65 kg/m  Physical Exam Vitals and nursing note reviewed.  Constitutional:      General: She is not in acute distress.    Appearance: She is well-developed.  HENT:     Head: Normocephalic and atraumatic.     Right Ear: External ear normal.     Left Ear: External ear normal.     Nose: Nose normal.  Eyes:     Conjunctiva/sclera:  Conjunctivae normal.  Cardiovascular:     Rate and Rhythm: Normal rate and regular rhythm.     Heart sounds: No murmur heard. Pulmonary:     Effort: No respiratory distress.     Breath sounds: No wheezing, rhonchi or rales.  Abdominal:     Palpations: Abdomen is soft.     Tenderness: There is abdominal tenderness. There is no guarding or rebound.     Comments: Bilateral lower abdominal tenderness to palpation without rebound or guarding  Musculoskeletal:     Cervical back: Normal range of motion and neck supple.     Right lower leg: No edema.     Left lower leg: No edema.  Skin:    General: Skin is warm and dry.     Findings: No rash.  Neurological:     General: No focal deficit present.     Mental Status: She is alert. Mental status is at baseline.     Motor: No weakness.  Psychiatric:        Mood and Affect: Mood normal.    ED Results / Procedures / Treatments   Labs (all labs ordered are listed, but only abnormal results are displayed) Labs Reviewed - No data to display  EKG None  Radiology No results found.  Procedures Procedures    Medications Ordered in ED Medications  ketorolac  (TORADOL ) 30 MG/ML injection 30 mg (has no administration in time range)  oxyCODONE -acetaminophen  (PERCOCET/ROXICET) 5-325 MG per tablet 1 tablet (has no administration in time range)    ED Course/ Medical Decision Making/ A&P    Patient seen and examined. History obtained directly from patient.  Reviewed imaging and lab work from 08/27/2023 which was reassuring.  Labs/EKG: None ordered Imaging: None ordered  Medications/Fluids: Ordered: IM Toradol , p.o. Percocet  Most recent vital signs reviewed and are as follows: BP (!) 155/95   Pulse 92   Temp 98.5 F (36.9 C) (Oral)   Resp 20   Ht 4\' 11"  (1.499 m)   Wt 57.6 kg   LMP 08/31/2023   SpO2 100%   BMI 25.65 kg/m   Initial impression: Pelvic pain, chronic in nature, worse over the past month.  Symptoms today are  unchanged from previous.  She reports onset of her menstrual period.  Will plan to treat pain and reassess.  Patient has upcoming follow-up.  Do not feel that she requires repeat lab testing at this time.  Vital signs are reassuring.  5:24 PM Reassessment performed. Patient appears stable, comfortable.  Once again confirmed, that she is comfortable with no further workup at this time.  I think that this is reasonable given no different or changing symptoms.  Patient agrees.  Most current  vital signs reviewed and are as follows: BP (!) 155/95   Pulse 92   Temp 98.5 F (36.9 C) (Oral)   Resp 20   Ht 4\' 11"  (1.499 m)   Wt 57.6 kg   LMP 08/31/2023   SpO2 100%   BMI 25.65 kg/m   Plan: Discharge to home.   Prescriptions written for: Percocet # 10 tablets  ED return instructions discussed: The patient was urged to return to the Emergency Department immediately with worsening of current symptoms, worsening abdominal pain, persistent vomiting, blood noted in stools, fever, or any other concerns. The patient verbalized understanding.   Follow-up instructions discussed: Patient encouraged to follow-up with their OB/GYN in 5 days as planned.                                Medical Decision Making Risk Prescription drug management.   Patient with ongoing pelvic pain, gradually worsening, interfering with daily activities and job.  Several ED visits for the same.  Workups recently have been negative.  Main issue is pain control until she can see her OB/GYN next Tuesday as planned.  Vital signs without fever, tachycardia.  Low concern for sepsis.  Wet prep performed most recent ED visit was negative.  Low concern for STI/PID.  Patient to follow-up with OB/GYN as planned.        Final Clinical Impression(s) / ED Diagnoses Final diagnoses:  Pelvic pain    Rx / DC Orders ED Discharge Orders          Ordered    oxyCODONE -acetaminophen  (PERCOCET/ROXICET) 5-325 MG tablet  Every 6 hours PRN         08/31/23 1723              Lyna Sandhoff, PA-C 08/31/23 1726    Scarlette Currier, MD 09/01/23 1414

## 2023-09-07 DIAGNOSIS — R102 Pelvic and perineal pain: Secondary | ICD-10-CM | POA: Diagnosis not present

## 2023-09-07 DIAGNOSIS — R110 Nausea: Secondary | ICD-10-CM | POA: Diagnosis not present

## 2023-09-07 DIAGNOSIS — N898 Other specified noninflammatory disorders of vagina: Secondary | ICD-10-CM | POA: Diagnosis not present

## 2023-09-07 DIAGNOSIS — N809 Endometriosis, unspecified: Secondary | ICD-10-CM | POA: Diagnosis not present

## 2023-09-08 ENCOUNTER — Other Ambulatory Visit: Payer: Self-pay

## 2023-09-08 ENCOUNTER — Emergency Department (HOSPITAL_BASED_OUTPATIENT_CLINIC_OR_DEPARTMENT_OTHER)
Admission: EM | Admit: 2023-09-08 | Discharge: 2023-09-08 | Disposition: A | Discharge status: Home / Self Care | Attending: Emergency Medicine | Admitting: Emergency Medicine

## 2023-09-08 ENCOUNTER — Encounter (HOSPITAL_BASED_OUTPATIENT_CLINIC_OR_DEPARTMENT_OTHER): Payer: Self-pay | Admitting: Emergency Medicine

## 2023-09-08 DIAGNOSIS — R102 Pelvic and perineal pain: Secondary | ICD-10-CM | POA: Diagnosis not present

## 2023-09-08 DIAGNOSIS — N809 Endometriosis, unspecified: Secondary | ICD-10-CM | POA: Diagnosis not present

## 2023-09-08 LAB — COMPREHENSIVE METABOLIC PANEL WITH GFR
ALT: <5 U/L (ref 0–44)
AST: 15 U/L (ref 15–41)
Albumin: 4.5 g/dL (ref 3.5–5.0)
Alkaline Phosphatase: 48 U/L (ref 38–126)
Anion gap: 11 (ref 5–15)
BUN: 10 mg/dL (ref 6–20)
CO2: 24 mmol/L (ref 22–32)
Calcium: 9.8 mg/dL (ref 8.9–10.3)
Chloride: 104 mmol/L (ref 98–111)
Creatinine, Ser: 0.85 mg/dL (ref 0.44–1.00)
GFR, Estimated: >60 mL/min (ref >60)
Glucose, Bld: 74 mg/dL (ref 70–99)
Potassium: 3.6 mmol/L (ref 3.5–5.1)
Sodium: 139 mmol/L (ref 135–145)
Total Bilirubin: 0.4 mg/dL (ref 0.0–1.2)
Total Protein: 7.1 g/dL (ref 6.5–8.1)

## 2023-09-08 LAB — URINALYSIS, ROUTINE W REFLEX MICROSCOPIC
Bilirubin Urine: NEGATIVE
Glucose, UA: NEGATIVE mg/dL
Hgb urine dipstick: NEGATIVE
Ketones, ur: NEGATIVE mg/dL
Nitrite: NEGATIVE
Protein, ur: NEGATIVE mg/dL
Specific Gravity, Urine: 1.019 (ref 1.005–1.030)
pH: 7 (ref 5.0–8.0)

## 2023-09-08 LAB — CBC
HCT: 34.7 % — ABNORMAL LOW (ref 36.0–46.0)
Hemoglobin: 12 g/dL (ref 12.0–15.0)
MCH: 29.1 pg (ref 26.0–34.0)
MCHC: 34.6 g/dL (ref 30.0–36.0)
MCV: 84.2 fL (ref 80.0–100.0)
Platelets: 323 10*3/uL (ref 150–400)
RBC: 4.12 MIL/uL (ref 3.87–5.11)
RDW: 12.5 % (ref 11.5–15.5)
WBC: 4.9 10*3/uL (ref 4.0–10.5)
nRBC: 0 % (ref 0.0–0.2)

## 2023-09-08 LAB — PREGNANCY, URINE: Preg Test, Ur: NEGATIVE

## 2023-09-08 LAB — LIPASE, BLOOD: Lipase: 17 U/L (ref 11–51)

## 2023-09-08 MED ORDER — IBUPROFEN 800 MG PO TABS
800.0000 mg | ORAL_TABLET | Freq: Once | ORAL | Status: AC
  Administered 2023-09-08: 800 mg via ORAL
  Filled 2023-09-08: qty 1

## 2023-09-08 MED ORDER — OXYCODONE HCL 5 MG PO TABS
5.0000 mg | ORAL_TABLET | Freq: Once | ORAL | Status: AC
  Administered 2023-09-08: 5 mg via ORAL
  Filled 2023-09-08: qty 1

## 2023-09-08 MED ORDER — OXYCODONE HCL 5 MG PO TABS: 5.0000 mg | ORAL_TABLET | Freq: Four times a day (QID) | ORAL | 0 refills | Status: AC | PRN

## 2023-09-08 NOTE — ED Triage Notes (Signed)
 Pelvic pain x 3 weeks  Seen by ob/gyn Severe pain now seeking pain managament

## 2023-09-08 NOTE — Discharge Instructions (Signed)
 Your labs are reassuring today.  Your blood counts, electrolytes, kidney, liver, and pancreas labs are normal.  Your urine does not show any signs of infection.  Your pregnancy test was negative.  You may take up to 1000mg  of tylenol  every 6 hours as needed for pain.  Do not take more then 4g per day.  You may use up to 800mg  ibuprofen  every 8 hours as needed for pain.  Do not exceed 2.4g of ibuprofen  per day.  You were given your first dose (800mg  ibuprofen ) here today.  You have been prescribed Oxycodone -this is a narcotic/controlled substance medication that has potential addicting qualities.  You may take 1 tablet every 6 hours as needed for severe pain.  Do not drive or operate heavy machinery when taking this medicine as it can be sedating. Do not drink alcohol  or take other sedating medications when taking this medicine for safety reasons.  Keep this out of reach of small children.    Please take the Or[place] prescribed by your OB/GYN.  Please follow-up with OB/GYN for further management of your endometriosis.  Please follow-up with pain management as soon as possible.   Return to the ER for any fevers, uncontrolled vomiting, severe worsening of your abdominal pain, any other new or concerning symptoms.

## 2023-09-08 NOTE — ED Provider Notes (Signed)
 [place] EMERGENCY DEPARTMENT AT [facility] Provider Note   CSN: [id number] Arrival date & time: 09/08/23  1536     History  Chief Complaint  Patient presents with   Pelvic Pain         Wanda Nixon is a 33 y.o. female with history of endometriosis, presents with concern for endometriosis pain.  States she is having pain in her lower pelvis that is consistent with previous flares.  Denying any fever, chills, dysuria, hematuria, increased frequency.  Reported some nausea due to the pain.  States she was seen by her OB/GYN a couple days ago and started on Orilissa for the pain and also referred to pain management, but has not heard back from them yet.  She has been trying Tylenol  and ibuprofen  at home for pain without improvement.    Pelvic Pain       Home Medications Prior to Admission medications   Medication Sig Start Date End Date Taking? Authorizing Provider  citalopram  (CELEXA ) 20 MG tablet Take 1 tablet (20 mg total) by mouth daily. 05/31/23   Omar Bibber, DO  oxyCODONE  (ROXICODONE ) 5 MG immediate release tablet Take 1 tablet (5 mg total) by mouth every 6 (six) hours as needed for up to 3 days for severe pain (pain score 7-10) or breakthrough pain (Pain not controlled with Tylenol  and ibuprofen ). 09/08/23 09/11/23 Yes Rexie Catena, PA-C  albuterol  (VENTOLIN  HFA) 108 (90 Base) MCG/ACT inhaler Inhale 2 puffs into the lungs every 6 (six) hours as needed for wheezing or shortness of breath. 05/06/22   Ernestina Headland, MD  clonazePAM  (KLONOPIN ) 0.5 MG tablet Take 1 tablet (0.5 mg total) by mouth 2 (two) times daily as needed for anxiety. 11/10/22   Genora Kidd, MD  cyclobenzaprine  (FLEXERIL ) 10 MG tablet Take 1 tablet (10 mg total) by mouth 2 (two) times daily as needed for muscle spasms. 01/26/23   Denese Finn, PA-C  ibuprofen  (ADVIL ) 800 MG tablet Take 1 tablet (800 mg total) by mouth 3 (three) times daily. 10/22/22   Mesner, Reymundo Caulk, MD  lidocaine  (LIDODERM )  5 % Place 1 patch onto the skin daily. Remove & Discard patch within 12 hours or as directed by MD 01/26/23   Denese Finn, PA-C  meclizine  (ANTIVERT ) 25 MG tablet Take 1 tablet (25 mg total) by mouth 3 (three) times daily as needed. 01/23/23   Clark, Meghan R, PA-C  ondansetron  (ZOFRAN ) 4 MG tablet Take 1 tablet (4 mg total) by mouth every 6 (six) hours. 08/27/23   Rolinda Climes, DO  ondansetron  (ZOFRAN -ODT) 4 MG disintegrating tablet Take 1 tablet (4 mg total) by mouth every 8 (eight) hours as needed for nausea or vomiting. 08/02/23   Rexie Catena, PA-C  senna-docusate (SENOKOT-S) 8.6-50 MG tablet Take 1 tablet by mouth daily. 09/25/22   Kingsley, Victoria K, DO  triamcinolone  cream (KENALOG ) 0.1 % APPLY TO AFFECTED AREA TWICE A DAY 08/27/22   Edsel Grace, MD  Vitamin D , Ergocalciferol , (DRISDOL ) 1.25 MG (50000 UNIT) CAPS capsule TAKE 1 CAPSULE BY MOUTH EVERY 7 DAYS. WHEN YOU RUN OUT OF THESE 12 TABLETS, PLEASE RETURN TO CLINIC FOR VIT. D LEVEL RECHECK. 08/23/22   Edsel Grace, MD      Allergies    Tomato, Augmentin  [amoxicillin -pot clavulanate], Mushroom extract complex (obsolete), and Reglan  [metoclopramide ]    Review of Systems   Review of Systems  Genitourinary:  Positive for pelvic pain.    Physical Exam Updated Vital Signs BP 122/85 (BP Location:  Right Arm)   Pulse 81   Temp 97.8 F (36.6 C) (Oral)   Resp 18   LMP 08/31/2023   SpO2 100%  Physical Exam Vitals and nursing note reviewed.  Constitutional:      General: She is not in acute distress.    Appearance: She is well-developed.  HENT:     Head: Normocephalic and atraumatic.  Eyes:     Conjunctiva/sclera: Conjunctivae normal.  Cardiovascular:     Rate and Rhythm: Normal rate and regular rhythm.     Heart sounds: No murmur heard. Pulmonary:     Effort: Pulmonary effort is normal. No respiratory distress.     Breath sounds: Normal breath sounds.  Abdominal:     Palpations: Abdomen is soft.     Tenderness:  There is no abdominal tenderness.  Genitourinary:    Comments: Declines Musculoskeletal:        General: No swelling.     Cervical back: Neck supple.  Skin:    General: Skin is warm and dry.     Capillary Refill: Capillary refill takes less than 2 seconds.  Neurological:     Mental Status: She is alert.  Psychiatric:        Mood and Affect: Mood normal.     ED Results / Procedures / Treatments   Labs (all labs ordered are listed, but only abnormal results are displayed) Labs Reviewed  CBC - Abnormal; Notable for the following components:      Result Value   HCT 34.7 (*)    All other components within normal limits  URINALYSIS, ROUTINE W REFLEX MICROSCOPIC - Abnormal; Notable for the following components:   Leukocytes,Ua SMALL (*)    Bacteria, UA RARE (*)    All other components within normal limits  LIPASE, BLOOD  COMPREHENSIVE METABOLIC PANEL WITH GFR  PREGNANCY, URINE    EKG None  Radiology No results found.  Procedures Procedures    Medications Ordered in ED Medications  oxyCODONE  (Oxy IR/ROXICODONE ) immediate release tablet 5 mg (5 mg Oral Given 09/08/23 1645)  ibuprofen  (ADVIL ) tablet 800 mg (800 mg Oral Given 09/08/23 1644)    ED Course/ Medical Decision Making/ A&P                                 Medical Decision Making Amount and/or Complexity of Data Reviewed Labs: ordered.  Risk Prescription drug management.     Differential diagnosis includes but is not limited to Cholelithiasis, cholangitis, choledocholithiasis, peptic ulcer, gastritis, gastroenteritis, appendicitis, IBS, IBD, DKA, nephrolithiasis, UTI, pyelonephritis, pancreatitis, diverticulitis, mesenteric ischemia, abdominal aortic aneurysm, small bowel obstruction, volvulus, ovarian torsion and pregnancy related concerns in females of childbearing age    ED Course:  Upon initial evaluation, patient is well-appearing, stable vital signs.  Reporting lower pelvic pain consistent with  previous endometriosis flares.  Abdomen is soft and nontender, no rebound or guarding.  No active vomiting.  Labs Ordered: I Ordered, and personally interpreted labs.  The pertinent results include:   CBC and CMP unremarkable Lipase within normal limits Urinalysis without signs of infection Pregnancy negative   Medications Given: Oxycodone  and ibuprofen  for pain  Upon re-evaluation, patient reports pain has improved with medications given today.  Low concern for any acute intra-abdominal pathology given reassuring labs, abdomen soft nontender, stable vitals, and patient feels this is the same as previous endometriosis flares.  Do not feel she needs any imaging or further evaluation  at this time.  I have reviewed previous ER visits, has been seen very frequently for her pain.  She states she has been started on new medication by her OB/GYN and has been referred to pain management.  Encouraged her to take this medication as prescribed and set up appointment with pain management as soon as possible.  We discussed that this pain needs to be managed by the doctor outpatient as this is a chronic issue and she verbalizes understanding.  Stable and appropriate for discharge home    Impression: Endometriosis pain  Disposition:  The patient was discharged home with instructions to take Orilissa as prescribed by her OB/GYN.  May take Tylenol  and ibuprofen  as needed for pain.  Follow-up with pain management as soon as possible. Provided short course of 12 oxycodone  for breakthrough pain until she can see pain management.  She understands she cannot drink or drive while on this medication. Return precautions given.    Record Review: External records from outside source obtained and reviewed including previous ER visits.  Appears patient has had 5 ER visits in the past month for her pain.     This chart was dictated using voice recognition software, Dragon. Despite the best efforts of this provider  to proofread and correct errors, errors may still occur which can change documentation meaning.         Final Clinical Impression(s) / ED Diagnoses Final diagnoses:  Endometriosis    Rx / DC Orders ED Discharge Orders          Ordered    oxyCODONE  (ROXICODONE ) 5 MG immediate release tablet  Every 6 hours PRN        09/08/23 1731              Rexie Catena, PA-C 09/08/23 1732    Afton Horse T, DO 09/10/23 1523

## 2023-09-15 ENCOUNTER — Other Ambulatory Visit: Payer: Self-pay

## 2023-09-15 ENCOUNTER — Encounter (HOSPITAL_BASED_OUTPATIENT_CLINIC_OR_DEPARTMENT_OTHER): Payer: Self-pay | Admitting: Emergency Medicine

## 2023-09-15 ENCOUNTER — Emergency Department (HOSPITAL_BASED_OUTPATIENT_CLINIC_OR_DEPARTMENT_OTHER): Admission: EM | Admit: 2023-09-15 | Discharge: 2023-09-15 | Disposition: A | Discharge status: Home / Self Care

## 2023-09-15 DIAGNOSIS — R110 Nausea: Secondary | ICD-10-CM | POA: Diagnosis not present

## 2023-09-15 DIAGNOSIS — R102 Pelvic and perineal pain: Secondary | ICD-10-CM | POA: Diagnosis not present

## 2023-09-15 DIAGNOSIS — G8929 Other chronic pain: Secondary | ICD-10-CM | POA: Insufficient documentation

## 2023-09-15 MED ORDER — KETOROLAC TROMETHAMINE 30 MG/ML IJ SOLN
30.0000 mg | Freq: Once | INTRAMUSCULAR | Status: DC
  Filled 2023-09-15: qty 1

## 2023-09-15 MED ORDER — ONDANSETRON HCL 4 MG/2ML IJ SOLN
4.0000 mg | Freq: Once | INTRAMUSCULAR | Status: DC
  Filled 2023-09-15: qty 2

## 2023-09-15 MED ORDER — MORPHINE SULFATE (PF) 4 MG/ML IV SOLN
4.0000 mg | Freq: Once | INTRAVENOUS | Status: DC
  Filled 2023-09-15: qty 1

## 2023-09-15 NOTE — Discharge Instructions (Signed)
 Contact a health care provider if: Your pain is not controlled with treatment. You have new pain. You have side effects from pain medicine. You feel weak or you have trouble doing your normal activities. You have trouble sleeping or you develop confusion. You lose feeling or have numbness in your body. You lose control of your bowels or bladder. Get help right away if: Your pain suddenly gets much worse. You develop chest pain. You have trouble breathing or shortness of breath. You faint, or another person sees you faint. These symptoms may be an emergency. Get help right away. Call 911. Do not wait to see if the symptoms will go away. Do not drive yourself to the hospital. Also, get help right away if: You have thoughts about hurting yourself or others. Take one of these steps if you feel like you may hurt yourself or others, or have thoughts about taking your own life: Go to your nearest emergency room. Call 911. Call the National Suicide Prevention Lifeline at 438-668-0630 or 988. This is open 24 hours a day. Text the Crisis Text Line at 986-756-9179.

## 2023-09-15 NOTE — ED Provider Notes (Signed)
 [place] EMERGENCY DEPARTMENT AT [facility] Provider Note   CSN: [id number] Arrival date & time: 09/15/23  1528     History  Chief Complaint  Patient presents with   Pelvic Pain    Wanda Nixon is a 33 y.o. female who presents to the emergency department for pelvic pain.  She has a history of chronic pelvic pain and endometriosis.  She has been seen multiple times over the past several years for this in the emergency department.  Has been seen 6 times in the last 2 months in the emergency department for her pelvic pain.  She states that she was getting narcotic pain relief from her OB/GYN but has been referred to pain management but has not yet heard back from them so has been getting refills of narcotics here in the emergency department to manage her pain.  She is also trying other supportive care measures without relief.  She is anxious because she feels stuck in limbo and all is continuing to have severe pain.  She has some associated nausea without vomiting she denies any new symptoms urinary symptoms fevers.   Pelvic Pain       Home Medications Prior to Admission medications   Medication Sig Start Date End Date Taking? Authorizing Provider  citalopram  (CELEXA ) 20 MG tablet Take 1 tablet (20 mg total) by mouth daily. 05/31/23   Omar Bibber, DO  albuterol  (VENTOLIN  HFA) 108 (90 Base) MCG/ACT inhaler Inhale 2 puffs into the lungs every 6 (six) hours as needed for wheezing or shortness of breath. 05/06/22   Ernestina Headland, MD  clonazePAM  (KLONOPIN ) 0.5 MG tablet Take 1 tablet (0.5 mg total) by mouth 2 (two) times daily as needed for anxiety. 11/10/22   Genora Kidd, MD  cyclobenzaprine  (FLEXERIL ) 10 MG tablet Take 1 tablet (10 mg total) by mouth 2 (two) times daily as needed for muscle spasms. 01/26/23   Denese Finn, PA-C  ibuprofen  (ADVIL ) 800 MG tablet Take 1 tablet (800 mg total) by mouth 3 (three) times daily. 10/22/22   Mesner, Reymundo Caulk, MD  lidocaine   (LIDODERM ) 5 % Place 1 patch onto the skin daily. Remove & Discard patch within 12 hours or as directed by MD 01/26/23   Denese Finn, PA-C  meclizine  (ANTIVERT ) 25 MG tablet Take 1 tablet (25 mg total) by mouth 3 (three) times daily as needed. 01/23/23   Clark, Meghan R, PA-C  ondansetron  (ZOFRAN ) 4 MG tablet Take 1 tablet (4 mg total) by mouth every 6 (six) hours. 08/27/23   Rolinda Climes, DO  ondansetron  (ZOFRAN -ODT) 4 MG disintegrating tablet Take 1 tablet (4 mg total) by mouth every 8 (eight) hours as needed for nausea or vomiting. 08/02/23   Rexie Catena, PA-C  senna-docusate (SENOKOT-S) 8.6-50 MG tablet Take 1 tablet by mouth daily. 09/25/22   Kingsley, Victoria K, DO  triamcinolone  cream (KENALOG ) 0.1 % APPLY TO AFFECTED AREA TWICE A DAY 08/27/22   Edsel Grace, MD  Vitamin D , Ergocalciferol , (DRISDOL ) 1.25 MG (50000 UNIT) CAPS capsule TAKE 1 CAPSULE BY MOUTH EVERY 7 DAYS. WHEN YOU RUN OUT OF THESE 12 TABLETS, PLEASE RETURN TO CLINIC FOR VIT. D LEVEL RECHECK. 08/23/22   Edsel Grace, MD      Allergies    Tomato, Augmentin  [amoxicillin -pot clavulanate], Mushroom extract complex (obsolete), and Reglan  [metoclopramide ]    Review of Systems   Review of Systems  Genitourinary:  Positive for pelvic pain.    Physical Exam Updated Vital Signs BP (!) 123/94 (  BP Location: Right Arm)   Pulse 93   Temp 98.1 F (36.7 C)   Resp 18   Ht 4\' 11"  (1.499 m)   Wt 57 kg   LMP 08/31/2023 (Exact Date)   SpO2 100%   BMI 25.38 kg/m  Physical Exam  ED Results / Procedures / Treatments   Labs (all labs ordered are listed, but only abnormal results are displayed) Labs Reviewed - No data to display  EKG None  Radiology No results found.  Procedures Procedures    Medications Ordered in ED Medications - No data to display  ED Course/ Medical Decision Making/ A&P                                 Medical Decision Making Amount and/or Complexity of Data Reviewed Labs:  ordered.  Risk Prescription drug management.   Patient here with recurrent and chronic pelvic pain.  She is in an unfortunate situation with her pain management however at bedside I had a discussion with the patient that I would be happy to treat her pain while she was here however did not feel that it was appropriate to continue to use the emergency department for her chronic pain needs.  I made it clear that I would not be sending her home with any opiates.  I placed orders for evaluation including labs however I was informed by the patient's nurse that she does not wish to stay.  Given the fact that the patient has this chronic pain issue and is not having any changes in her symptoms is hemodynamically stable I think she is safe to follow-up at the outpatient setting.  She may return for any new or worsening symptoms.        Final Clinical Impression(s) / ED Diagnoses Final diagnoses:  None    Rx / DC Orders ED Discharge Orders     None         Tama Fails, PA-C 09/15/23 1809    Rolinda Climes, DO 09/15/23 2111

## 2023-09-15 NOTE — ED Triage Notes (Signed)
 Pt caox4 c/o recurrent pelvic pain x2 days with PMH endometriosis.

## 2023-09-15 NOTE — ED Notes (Signed)
 Pt advised that she is going to leave. Pt expressed concern about ongoing pain once treatment from today's ER visit wears off. Pt stated she is waiting to get an appt with the pain clinic to manage her symptoms until she has a hysterectomy. ED PA made aware. Pt advised of risks of leaving AMA and she verbalized understanding.

## 2023-09-29 ENCOUNTER — Emergency Department (HOSPITAL_BASED_OUTPATIENT_CLINIC_OR_DEPARTMENT_OTHER)
Admission: EM | Admit: 2023-09-29 | Discharge: 2023-09-29 | Disposition: A | Discharge status: Home / Self Care | Attending: Emergency Medicine | Admitting: Emergency Medicine

## 2023-09-29 ENCOUNTER — Encounter (HOSPITAL_BASED_OUTPATIENT_CLINIC_OR_DEPARTMENT_OTHER): Payer: Self-pay

## 2023-09-29 ENCOUNTER — Other Ambulatory Visit: Payer: Self-pay

## 2023-09-29 DIAGNOSIS — R10817 Generalized abdominal tenderness: Secondary | ICD-10-CM | POA: Diagnosis not present

## 2023-09-29 DIAGNOSIS — N809 Endometriosis, unspecified: Secondary | ICD-10-CM | POA: Diagnosis not present

## 2023-09-29 LAB — CBC WITH DIFFERENTIAL/PLATELET
Abs Immature Granulocytes: 0.01 10*3/uL (ref 0.00–0.07)
Basophils Absolute: 0 10*3/uL (ref 0.0–0.1)
Basophils Relative: 0 %
Eosinophils Absolute: 0.1 10*3/uL (ref 0.0–0.5)
Eosinophils Relative: 2 %
HCT: 34.7 % — ABNORMAL LOW (ref 36.0–46.0)
Hemoglobin: 12.1 g/dL (ref 12.0–15.0)
Immature Granulocytes: 0 %
Lymphocytes Relative: 24 %
Lymphs Abs: 1.1 10*3/uL (ref 0.7–4.0)
MCH: 29.5 pg (ref 26.0–34.0)
MCHC: 34.9 g/dL (ref 30.0–36.0)
MCV: 84.6 fL (ref 80.0–100.0)
Monocytes Absolute: 0.3 10*3/uL (ref 0.1–1.0)
Monocytes Relative: 6 %
Neutro Abs: 3.1 10*3/uL (ref 1.7–7.7)
Neutrophils Relative %: 68 %
Platelets: 247 10*3/uL (ref 150–400)
RBC: 4.1 MIL/uL (ref 3.87–5.11)
RDW: 12.7 % (ref 11.5–15.5)
WBC: 4.5 10*3/uL (ref 4.0–10.5)
nRBC: 0 % (ref 0.0–0.2)

## 2023-09-29 LAB — URINALYSIS, ROUTINE W REFLEX MICROSCOPIC
Bacteria, UA: NONE SEEN
Bilirubin Urine: NEGATIVE
Glucose, UA: NEGATIVE mg/dL
Leukocytes,Ua: NEGATIVE
Nitrite: NEGATIVE
Protein, ur: 30 mg/dL — AB
Specific Gravity, Urine: 1.031 — ABNORMAL HIGH (ref 1.005–1.030)
pH: 6 (ref 5.0–8.0)

## 2023-09-29 LAB — COMPREHENSIVE METABOLIC PANEL WITH GFR
ALT: 7 U/L (ref 0–44)
AST: 17 U/L (ref 15–41)
Albumin: 4.5 g/dL (ref 3.5–5.0)
Alkaline Phosphatase: 46 U/L (ref 38–126)
Anion gap: 13 (ref 5–15)
BUN: 13 mg/dL (ref 6–20)
CO2: 22 mmol/L (ref 22–32)
Calcium: 9.5 mg/dL (ref 8.9–10.3)
Chloride: 103 mmol/L (ref 98–111)
Creatinine, Ser: 0.75 mg/dL (ref 0.44–1.00)
GFR, Estimated: >60 mL/min (ref >60)
Glucose, Bld: 90 mg/dL (ref 70–99)
Potassium: 3.6 mmol/L (ref 3.5–5.1)
Sodium: 138 mmol/L (ref 135–145)
Total Bilirubin: 0.5 mg/dL (ref 0.0–1.2)
Total Protein: 7.4 g/dL (ref 6.5–8.1)

## 2023-09-29 LAB — PREGNANCY, URINE: Preg Test, Ur: NEGATIVE

## 2023-09-29 MED ORDER — OXYCODONE-ACETAMINOPHEN 5-325 MG PO TABS: 1.0000 | ORAL_TABLET | Freq: Four times a day (QID) | ORAL | 0 refills | Status: DC | PRN

## 2023-09-29 MED ORDER — OXYCODONE-ACETAMINOPHEN 5-325 MG PO TABS
1.0000 | ORAL_TABLET | Freq: Once | ORAL | Status: AC
  Administered 2023-09-29: 1 via ORAL
  Filled 2023-09-29: qty 1

## 2023-09-29 NOTE — ED Notes (Signed)
Reviewed discharge instructions, medications, and home care with pt. Pt verbalized understanding and had no further questions. Pt exited ED without complications.

## 2023-09-29 NOTE — ED Notes (Signed)
 ED Provider at bedside.

## 2023-09-29 NOTE — Discharge Instructions (Signed)
 You were seen today for concerns of abdominal pain.  Your labs are thankfully reassuring.  This does appear to be most likely due to your endometriosis.  Please follow-up with your OB/GYN and the pain management clinic for further management of this pain.  For any concerns of worsening symptoms, return the emergency department.

## 2023-09-29 NOTE — ED Triage Notes (Signed)
 Arrives POV with complaints of abdominal pain related to endometriosis. Patient is scheduled to see a pain specialist soon and she has been seen by her OBGYN.

## 2023-09-29 NOTE — ED Provider Notes (Signed)
 [place] EMERGENCY DEPARTMENT AT [facility] Provider Note   CSN: [id number] Arrival date & time: 09/29/23  1501     History Chief Complaint  Patient presents with   Abdominal Pain    Wanda Nixon is a 33 y.o. female.  Patient with a history of endometriosis, uterine fibroids, and chronic pelvic pain presents the emergency department today with concerns of abdominal pain.  She reports that she is currently scheduled to see a pain management clinic for her ongoing pelvic pain from her endometriosis.  Reports that she is aware that her treatment consists of a hysterectomy but has been reluctant to pursue this due to concerns of try to care for her family as a single mother.  States that her pain is primarily in the lower abdomen and pain is been severe enough during flareups which coincide with her menstrual cycle that she will vomit but denies significant nausea.  No diarrhea.  No recent fever, chills or bodyaches.   Abdominal Pain      Home Medications Prior to Admission medications   Medication Sig Start Date End Date Taking? Authorizing Provider  citalopram  (CELEXA ) 20 MG tablet Take 1 tablet (20 mg total) by mouth daily. 05/31/23   Omar Bibber, DO  oxyCODONE -acetaminophen  (PERCOCET/ROXICET) 5-325 MG tablet Take 1 tablet by mouth every 6 (six) hours as needed for severe pain (pain score 7-10). 09/29/23  Yes Vasti Yagi A, PA-C  albuterol  (VENTOLIN  HFA) 108 (90 Base) MCG/ACT inhaler Inhale 2 puffs into the lungs every 6 (six) hours as needed for wheezing or shortness of breath. 05/06/22   Ernestina Headland, MD  clonazePAM  (KLONOPIN ) 0.5 MG tablet Take 1 tablet (0.5 mg total) by mouth 2 (two) times daily as needed for anxiety. 11/10/22   Genora Kidd, MD  cyclobenzaprine  (FLEXERIL ) 10 MG tablet Take 1 tablet (10 mg total) by mouth 2 (two) times daily as needed for muscle spasms. 01/26/23   Denese Finn, PA-C  ibuprofen  (ADVIL ) 800 MG tablet Take 1 tablet (800 mg total)  by mouth 3 (three) times daily. 10/22/22   Mesner, Reymundo Caulk, MD  lidocaine  (LIDODERM ) 5 % Place 1 patch onto the skin daily. Remove & Discard patch within 12 hours or as directed by MD 01/26/23   Denese Finn, PA-C  meclizine  (ANTIVERT ) 25 MG tablet Take 1 tablet (25 mg total) by mouth 3 (three) times daily as needed. 01/23/23   Clark, Meghan R, PA-C  ondansetron  (ZOFRAN ) 4 MG tablet Take 1 tablet (4 mg total) by mouth every 6 (six) hours. 08/27/23   Rolinda Climes, DO  ondansetron  (ZOFRAN -ODT) 4 MG disintegrating tablet Take 1 tablet (4 mg total) by mouth every 8 (eight) hours as needed for nausea or vomiting. 08/02/23   Rexie Catena, PA-C  senna-docusate (SENOKOT-S) 8.6-50 MG tablet Take 1 tablet by mouth daily. 09/25/22   Kingsley, Victoria K, DO  triamcinolone  cream (KENALOG ) 0.1 % APPLY TO AFFECTED AREA TWICE A DAY 08/27/22   Edsel Grace, MD  Vitamin D , Ergocalciferol , (DRISDOL ) 1.25 MG (50000 UNIT) CAPS capsule TAKE 1 CAPSULE BY MOUTH EVERY 7 DAYS. WHEN YOU RUN OUT OF THESE 12 TABLETS, PLEASE RETURN TO CLINIC FOR VIT. D LEVEL RECHECK. 08/23/22   Edsel Grace, MD      Allergies    Tomato, Augmentin  [amoxicillin -pot clavulanate], Mushroom extract complex (obsolete), and Reglan  [metoclopramide ]    Review of Systems   Review of Systems  Gastrointestinal:  Positive for abdominal pain.  All other systems reviewed and are  negative.   Physical Exam Updated Vital Signs BP (!) 116/94   Pulse 81   Temp 98.3 F (36.8 C) (Oral)   Resp 18   Ht 4\' 11"  (1.499 m)   Wt 56.7 kg   LMP 09/29/2023 (Exact Date)   SpO2 100%   BMI 25.25 kg/m  Physical Exam Vitals and nursing note reviewed.  Constitutional:      General: She is not in acute distress.    Appearance: She is well-developed.  HENT:     Head: Normocephalic and atraumatic.  Eyes:     Conjunctiva/sclera: Conjunctivae normal.  Cardiovascular:     Rate and Rhythm: Normal rate and regular rhythm.     Heart sounds: No murmur  heard. Pulmonary:     Effort: Pulmonary effort is normal. No respiratory distress.     Breath sounds: Normal breath sounds.  Abdominal:     Palpations: Abdomen is soft.     Tenderness: There is generalized abdominal tenderness. There is no guarding.  Musculoskeletal:        General: No swelling.     Cervical back: Neck supple.  Skin:    General: Skin is warm and dry.     Capillary Refill: Capillary refill takes less than 2 seconds.  Neurological:     Mental Status: She is alert.  Psychiatric:        Mood and Affect: Mood normal.     ED Results / Procedures / Treatments   Labs (all labs ordered are listed, but only abnormal results are displayed) Labs Reviewed  CBC WITH DIFFERENTIAL/PLATELET - Abnormal; Notable for the following components:      Result Value   HCT 34.7 (*)    All other components within normal limits  URINALYSIS, ROUTINE W REFLEX MICROSCOPIC - Abnormal; Notable for the following components:   Specific Gravity, Urine 1.031 (*)    Hgb urine dipstick TRACE (*)    Ketones, ur TRACE (*)    Protein, ur 30 (*)    All other components within normal limits  COMPREHENSIVE METABOLIC PANEL WITH GFR  PREGNANCY, URINE    EKG None  Radiology No results found.  Procedures Procedures    Medications Ordered in ED Medications  oxyCODONE -acetaminophen  (PERCOCET/ROXICET) 5-325 MG per tablet 1 tablet (1 tablet Oral Given 09/29/23 1638)    ED Course/ Medical Decision Making/ A&P                                 Medical Decision Making  This patient presents to the ED for concern of abdominal pain.  Differential diagnosis includes endometriosis, chronic abdominal pain, bowel obstruction, constipation, appendicitis    Lab Tests:  I Ordered, and personally interpreted labs.  The pertinent results include: CBC unremarkable, CMP unremarkable, urine pregnancy negative, urinalysis not any obvious signs of infection   Medicines ordered and prescription drug  management:  I ordered medication including Percocet for pain Reevaluation of the patient after these medicines showed that the patient improved I have reviewed the patients home medicines and have made adjustments as needed   Problem List / ED Course:  Patient with past history significant for endometriosis, chronic abdominal pain presents to the emergency department today with concerns of abdominal pain.  Reports that she is currently on her period and her endometriosis related pain typically worsens around the time of her menstrual cycle.  States that she takes Percocet at home for pain that is  currently prescribed by her OB/GYN.  She states that she is in the process of being seen by pain management with an appointment scheduled for Monday as well as Thursday of next week.  States that she has tried taking over-the-counter medications at home with minimal improvement in pain.  Does not have any Percocet available at home at this time. On exam, there is generalized abdominal pain but no guarding present.  Will proceed with basic labs will hold off on imaging given recent CT imaging about 1 month ago that was normal.  Pain is not responding to pain medications, advised patient that CT imaging may be needed.  She is agreeable with this plan. Basic labs are unremarkable.  Patient responds well to Percocet.  She states that pain is largely gone at this time.  There has been some concern previously about poorly controlled chronic pain that patient was not managing through the emergency department which is possible, however, patient appears to be working towards long-term management with a pain management clinic. Agreed to send #5 of Percocet for current pain flareup and advised to only use for severe pain. Patient is otherwise agreeable with plan at this time and verbalized understanding return precautions. Discharged home in stable condition.   Final Clinical Impression(s) / ED Diagnoses Final  diagnoses:  Endometriosis    Rx / DC Orders ED Discharge Orders          Ordered    oxyCODONE -acetaminophen  (PERCOCET/ROXICET) 5-325 MG tablet  Every 6 hours PRN        09/29/23 1753              Sanford Lindblad A, PA-C 09/29/23 1757    Horton, Kristie M, DO 09/29/23 2328

## 2023-10-02 DIAGNOSIS — M533 Sacrococcygeal disorders, not elsewhere classified: Secondary | ICD-10-CM | POA: Diagnosis not present

## 2023-10-02 DIAGNOSIS — Z5181 Encounter for therapeutic drug level monitoring: Secondary | ICD-10-CM | POA: Diagnosis not present

## 2023-10-02 DIAGNOSIS — M47816 Spondylosis without myelopathy or radiculopathy, lumbar region: Secondary | ICD-10-CM | POA: Diagnosis not present

## 2023-10-02 DIAGNOSIS — R52 Pain, unspecified: Secondary | ICD-10-CM | POA: Diagnosis not present

## 2023-10-02 DIAGNOSIS — G588 Other specified mononeuropathies: Secondary | ICD-10-CM | POA: Diagnosis not present

## 2023-10-02 DIAGNOSIS — Z79899 Other long term (current) drug therapy: Secondary | ICD-10-CM | POA: Diagnosis not present

## 2023-10-16 ENCOUNTER — Other Ambulatory Visit: Payer: Self-pay

## 2023-10-16 ENCOUNTER — Encounter (HOSPITAL_BASED_OUTPATIENT_CLINIC_OR_DEPARTMENT_OTHER): Payer: Self-pay

## 2023-10-16 ENCOUNTER — Emergency Department (HOSPITAL_BASED_OUTPATIENT_CLINIC_OR_DEPARTMENT_OTHER)
Admission: EM | Admit: 2023-10-16 | Discharge: 2023-10-16 | Disposition: A | Discharge status: Home / Self Care | Attending: Emergency Medicine | Admitting: Emergency Medicine

## 2023-10-16 DIAGNOSIS — N809 Endometriosis, unspecified: Secondary | ICD-10-CM | POA: Diagnosis not present

## 2023-10-16 DIAGNOSIS — R519 Headache, unspecified: Secondary | ICD-10-CM | POA: Diagnosis not present

## 2023-10-16 DIAGNOSIS — G43809 Other migraine, not intractable, without status migrainosus: Secondary | ICD-10-CM | POA: Insufficient documentation

## 2023-10-16 LAB — COMPREHENSIVE METABOLIC PANEL WITH GFR
ALT: 7 U/L (ref 0–44)
AST: 16 U/L (ref 15–41)
Albumin: 4.4 g/dL (ref 3.5–5.0)
Alkaline Phosphatase: 47 U/L (ref 38–126)
Anion gap: 15 (ref 5–15)
BUN: 12 mg/dL (ref 6–20)
CO2: 22 mmol/L (ref 22–32)
Calcium: 9.6 mg/dL (ref 8.9–10.3)
Chloride: 104 mmol/L (ref 98–111)
Creatinine, Ser: 0.76 mg/dL (ref 0.44–1.00)
GFR, Estimated: >60 mL/min (ref >60)
Glucose, Bld: 102 mg/dL — ABNORMAL HIGH (ref 70–99)
Potassium: 3.4 mmol/L — ABNORMAL LOW (ref 3.5–5.1)
Sodium: 140 mmol/L (ref 135–145)
Total Bilirubin: 0.4 mg/dL (ref 0.0–1.2)
Total Protein: 7.7 g/dL (ref 6.5–8.1)

## 2023-10-16 LAB — CBC
HCT: 37.2 % (ref 36.0–46.0)
Hemoglobin: 13.2 g/dL (ref 12.0–15.0)
MCH: 29.5 pg (ref 26.0–34.0)
MCHC: 35.5 g/dL (ref 30.0–36.0)
MCV: 83 fL (ref 80.0–100.0)
Platelets: 296 10*3/uL (ref 150–400)
RBC: 4.48 MIL/uL (ref 3.87–5.11)
RDW: 12.6 % (ref 11.5–15.5)
WBC: 5 10*3/uL (ref 4.0–10.5)
nRBC: 0 % (ref 0.0–0.2)

## 2023-10-16 LAB — URINALYSIS, ROUTINE W REFLEX MICROSCOPIC
Bilirubin Urine: NEGATIVE
Glucose, UA: NEGATIVE mg/dL
Hgb urine dipstick: NEGATIVE
Leukocytes,Ua: NEGATIVE
Nitrite: NEGATIVE
Protein, ur: NEGATIVE mg/dL
Specific Gravity, Urine: 1.015 (ref 1.005–1.030)
pH: 7.5 (ref 5.0–8.0)

## 2023-10-16 LAB — LIPASE, BLOOD: Lipase: 20 U/L (ref 11–51)

## 2023-10-16 LAB — HCG, SERUM, QUALITATIVE: Preg, Serum: NEGATIVE

## 2023-10-16 MED ORDER — OXYCODONE-ACETAMINOPHEN 5-325 MG PO TABS
1.0000 | ORAL_TABLET | Freq: Once | ORAL | Status: AC
  Administered 2023-10-16: 1 via ORAL
  Filled 2023-10-16: qty 1

## 2023-10-16 MED ORDER — PROCHLORPERAZINE EDISYLATE 10 MG/2ML IJ SOLN
10.0000 mg | Freq: Once | INTRAMUSCULAR | Status: DC
  Filled 2023-10-16: qty 2

## 2023-10-16 MED ORDER — ONDANSETRON HCL 4 MG/2ML IJ SOLN
4.0000 mg | Freq: Once | INTRAMUSCULAR | Status: AC
  Administered 2023-10-16: 4 mg via INTRAVENOUS
  Filled 2023-10-16: qty 2

## 2023-10-16 MED ORDER — KETOROLAC TROMETHAMINE 30 MG/ML IJ SOLN
15.0000 mg | Freq: Once | INTRAMUSCULAR | Status: AC
  Administered 2023-10-16: 15 mg via INTRAVENOUS
  Filled 2023-10-16: qty 1

## 2023-10-16 MED ORDER — DIPHENHYDRAMINE HCL 50 MG/ML IJ SOLN: 25.0000 mg | Freq: Once | INTRAMUSCULAR | Status: DC

## 2023-10-16 MED ORDER — SODIUM CHLORIDE 0.9 % IV BOLUS
1000.0000 mL | Freq: Once | INTRAVENOUS | Status: AC
  Administered 2023-10-16: 1000 mL via INTRAVENOUS

## 2023-10-16 NOTE — ED Provider Notes (Signed)
 [place] EMERGENCY DEPARTMENT AT [facility] Provider Note   CSN: [id number] Arrival date & time: 10/16/23  1723     Patient presents with: Migraine and Abdominal Pain   Wanda Nixon is a 33 y.o. female.  With a history of endometriosis and migraine headaches who presents to the ED for headache and abdominal pain.  Frequent visits to the ED for pain related to endometriosis.  About 8 visits in the last 2 months.  Has an upcoming appointment for a nerve block to help with the endometriosis pain.  Headache and abdominal pain have been persistent for the last 3 days.  Refractory to her pain regimen at home with ibuprofen .  No fevers chills vaginal bleeding dysuria or other systemic complaints at this time    Migraine Associated symptoms include abdominal pain.  Abdominal Pain      Prior to Admission medications   Medication Sig Start Date End Date Taking? Authorizing Provider  citalopram  (CELEXA ) 20 MG tablet Take 1 tablet (20 mg total) by mouth daily. 05/31/23   Omar Bibber, DO  albuterol  (VENTOLIN  HFA) 108 (90 Base) MCG/ACT inhaler Inhale 2 puffs into the lungs every 6 (six) hours as needed for wheezing or shortness of breath. 05/06/22   Ernestina Headland, MD  clonazePAM  (KLONOPIN ) 0.5 MG tablet Take 1 tablet (0.5 mg total) by mouth 2 (two) times daily as needed for anxiety. 11/10/22   Genora Kidd, MD  cyclobenzaprine  (FLEXERIL ) 10 MG tablet Take 1 tablet (10 mg total) by mouth 2 (two) times daily as needed for muscle spasms. 01/26/23   Denese Finn, PA-C  ibuprofen  (ADVIL ) 800 MG tablet Take 1 tablet (800 mg total) by mouth 3 (three) times daily. 10/22/22   Mesner, Reymundo Caulk, MD  lidocaine  (LIDODERM ) 5 % Place 1 patch onto the skin daily. Remove & Discard patch within 12 hours or as directed by MD 01/26/23   Denese Finn, PA-C  meclizine  (ANTIVERT ) 25 MG tablet Take 1 tablet (25 mg total) by mouth 3 (three) times daily as needed. 01/23/23   Clark, Meghan R, PA-C   ondansetron  (ZOFRAN ) 4 MG tablet Take 1 tablet (4 mg total) by mouth every 6 (six) hours. 08/27/23   Rolinda Climes, DO  ondansetron  (ZOFRAN -ODT) 4 MG disintegrating tablet Take 1 tablet (4 mg total) by mouth every 8 (eight) hours as needed for nausea or vomiting. 08/02/23   Franaszek, Amanda, PA-C  oxyCODONE -acetaminophen  (PERCOCET/ROXICET) 5-325 MG tablet Take 1 tablet by mouth every 6 (six) hours as needed for severe pain (pain score 7-10). 09/29/23   Zelaya, Oscar A, PA-C  senna-docusate (SENOKOT-S) 8.6-50 MG tablet Take 1 tablet by mouth daily. 09/25/22   Kingsley, Victoria K, DO  triamcinolone  cream (KENALOG ) 0.1 % APPLY TO AFFECTED AREA TWICE A DAY 08/27/22   Edsel Grace, MD  Vitamin D , Ergocalciferol , (DRISDOL ) 1.25 MG (50000 UNIT) CAPS capsule TAKE 1 CAPSULE BY MOUTH EVERY 7 DAYS. WHEN YOU RUN OUT OF THESE 12 TABLETS, PLEASE RETURN TO CLINIC FOR VIT. D LEVEL RECHECK. 08/23/22   Edsel Grace, MD    Allergies: Tomato, Augmentin  [amoxicillin -pot clavulanate], Mushroom extract complex (obsolete), and Reglan  [metoclopramide ]    Review of Systems  Gastrointestinal:  Positive for abdominal pain.    Updated Vital Signs BP (!) 119/96   Pulse 87   Temp 97.8 F (36.6 C)   Resp 18   LMP 09/29/2023 (Exact Date)   SpO2 100%   Physical Exam Vitals and nursing note reviewed.  HENT:  Head: Normocephalic and atraumatic.   Eyes:     Pupils: Pupils are equal, round, and reactive to light.    Cardiovascular:     Rate and Rhythm: Normal rate and regular rhythm.  Pulmonary:     Effort: Pulmonary effort is normal.     Breath sounds: Normal breath sounds.  Abdominal:     Palpations: Abdomen is soft.     Tenderness: There is no abdominal tenderness.   Skin:    General: Skin is warm and dry.   Neurological:     Mental Status: She is alert.   Psychiatric:        Mood and Affect: Mood normal.     (all labs ordered are listed, but only abnormal results are displayed) Labs  Reviewed  COMPREHENSIVE METABOLIC PANEL WITH GFR - Abnormal; Notable for the following components:      Result Value   Potassium 3.4 (*)    Glucose, Bld 102 (*)    All other components within normal limits  URINALYSIS, ROUTINE W REFLEX MICROSCOPIC - Abnormal; Notable for the following components:   Color, Urine STRAW (*)    Ketones, ur TRACE (*)    All other components within normal limits  LIPASE, BLOOD  CBC  HCG, SERUM, QUALITATIVE    EKG: None  Radiology: No results found.   Procedures   Medications Ordered in the ED  prochlorperazine  (COMPAZINE ) injection 10 mg (10 mg Intravenous Not Given 10/16/23 2053)  sodium chloride  0.9 % bolus 1,000 mL (0 mLs Intravenous Stopped 10/16/23 2133)  ondansetron  (ZOFRAN ) injection 4 mg (4 mg Intravenous Given 10/16/23 2049)  ketorolac  (TORADOL ) 30 MG/ML injection 15 mg (15 mg Intravenous Given 10/16/23 2048)  oxyCODONE -acetaminophen  (PERCOCET/ROXICET) 5-325 MG per tablet 1 tablet (1 tablet Oral Given 10/16/23 2106)    Clinical Course as of 10/16/23 2134  Mon Oct 16, 2023  2131 Reevaluated patient.  Significant improvement in her discomfort after medications.  UA negative pregnancy negative blood work looks okay no significant leukocytosis electrolyte imbalance or anemia.  She will follow-up with her primary team for additional pain management [MP]    Clinical Course User Index [MP] Sallyanne Creamer, DO                                 Medical Decision Making 33 year old female seen here frequently for abdominal pain related to endometriosis returns for abdominal pain and migraine headache.  3 days of symptoms.  No focal neurologic deficits.  No significant abdominal tenderness on my exam.  Laboratory workup shows no leukocytosis metabolic derangement AKI elevated lipase.  Suspect this is most likely chronic pain related to her endometriosis.  Will provide IV fluids Toradol  and Zofran  Compazine  and 1 p.o. dose of oxycodone  Tylenol  while she is  here for pain relief.  Otherwise she will need to follow-up with her primary team.  Pregnancy negative  Amount and/or Complexity of Data Reviewed Labs: ordered.  Risk Prescription drug management.        Final diagnoses:  Other migraine without status migrainosus, not intractable  Endometriosis    ED Discharge Orders     None          Sallyanne Creamer, DO 10/16/23 2134

## 2023-10-16 NOTE — ED Notes (Signed)
 Reviewed AVS/discharge instruction with patient. Time allotted for and all questions answered. Patient is agreeable for d/c and escorted to ed exit by staff.

## 2023-10-16 NOTE — Discharge Instructions (Signed)
 You were seen in the emerged part for abdominal pain related to your endometriosis and headache Your blood work looks okay Your pain improved after medications here Follow-up with your primary team discuss further pain management

## 2023-10-16 NOTE — ED Triage Notes (Signed)
 Pt c/o extreme abd pain, migraine x days, just hasn't let up. Advises hx endometriosis, migraines, nothing's helping.  Sees pain specialist Friday for nerve block, need something to get through it.   Ibuprofen  for pain, no relief.

## 2023-10-17 DIAGNOSIS — G588 Other specified mononeuropathies: Secondary | ICD-10-CM | POA: Diagnosis not present

## 2023-10-17 DIAGNOSIS — M533 Sacrococcygeal disorders, not elsewhere classified: Secondary | ICD-10-CM | POA: Diagnosis not present

## 2023-10-17 DIAGNOSIS — R52 Pain, unspecified: Secondary | ICD-10-CM | POA: Diagnosis not present

## 2023-10-17 DIAGNOSIS — M47816 Spondylosis without myelopathy or radiculopathy, lumbar region: Secondary | ICD-10-CM | POA: Diagnosis not present

## 2023-10-22 ENCOUNTER — Other Ambulatory Visit: Payer: Self-pay

## 2023-10-22 ENCOUNTER — Emergency Department (HOSPITAL_BASED_OUTPATIENT_CLINIC_OR_DEPARTMENT_OTHER): Admission: EM | Admit: 2023-10-22 | Discharge: 2023-10-22 | Disposition: A | Discharge status: Home / Self Care

## 2023-10-22 ENCOUNTER — Encounter (HOSPITAL_BASED_OUTPATIENT_CLINIC_OR_DEPARTMENT_OTHER): Payer: Self-pay

## 2023-10-22 DIAGNOSIS — R1030 Lower abdominal pain, unspecified: Secondary | ICD-10-CM

## 2023-10-22 DIAGNOSIS — R1031 Right lower quadrant pain: Secondary | ICD-10-CM | POA: Insufficient documentation

## 2023-10-22 DIAGNOSIS — R1032 Left lower quadrant pain: Secondary | ICD-10-CM | POA: Insufficient documentation

## 2023-10-22 DIAGNOSIS — M545 Low back pain, unspecified: Secondary | ICD-10-CM | POA: Diagnosis not present

## 2023-10-22 LAB — CBC WITH DIFFERENTIAL/PLATELET
Abs Immature Granulocytes: 0.01 10*3/uL (ref 0.00–0.07)
Basophils Absolute: 0 10*3/uL (ref 0.0–0.1)
Basophils Relative: 0 %
Eosinophils Absolute: 0.1 10*3/uL (ref 0.0–0.5)
Eosinophils Relative: 2 %
HCT: 34.3 % — ABNORMAL LOW (ref 36.0–46.0)
Hemoglobin: 12 g/dL (ref 12.0–15.0)
Immature Granulocytes: 0 %
Lymphocytes Relative: 21 %
Lymphs Abs: 1.1 10*3/uL (ref 0.7–4.0)
MCH: 29.6 pg (ref 26.0–34.0)
MCHC: 35 g/dL (ref 30.0–36.0)
MCV: 84.5 fL (ref 80.0–100.0)
Monocytes Absolute: 0.3 10*3/uL (ref 0.1–1.0)
Monocytes Relative: 7 %
Neutro Abs: 3.5 10*3/uL (ref 1.7–7.7)
Neutrophils Relative %: 70 %
Platelets: 280 10*3/uL (ref 150–400)
RBC: 4.06 MIL/uL (ref 3.87–5.11)
RDW: 12.5 % (ref 11.5–15.5)
WBC: 5 10*3/uL (ref 4.0–10.5)
nRBC: 0 % (ref 0.0–0.2)

## 2023-10-22 LAB — URINALYSIS, ROUTINE W REFLEX MICROSCOPIC
Bacteria, UA: NONE SEEN
Bilirubin Urine: NEGATIVE
Glucose, UA: NEGATIVE mg/dL
Ketones, ur: NEGATIVE mg/dL
Leukocytes,Ua: NEGATIVE
Nitrite: NEGATIVE
Protein, ur: NEGATIVE mg/dL
Specific Gravity, Urine: 1.021 (ref 1.005–1.030)
pH: 6.5 (ref 5.0–8.0)

## 2023-10-22 LAB — COMPREHENSIVE METABOLIC PANEL WITH GFR
ALT: 6 U/L (ref 0–44)
AST: 16 U/L (ref 15–41)
Albumin: 4.3 g/dL (ref 3.5–5.0)
Alkaline Phosphatase: 43 U/L (ref 38–126)
Anion gap: 12 (ref 5–15)
BUN: 13 mg/dL (ref 6–20)
CO2: 24 mmol/L (ref 22–32)
Calcium: 9.6 mg/dL (ref 8.9–10.3)
Chloride: 105 mmol/L (ref 98–111)
Creatinine, Ser: 0.78 mg/dL (ref 0.44–1.00)
GFR, Estimated: >60 mL/min (ref >60)
Glucose, Bld: 89 mg/dL (ref 70–99)
Potassium: 4.1 mmol/L (ref 3.5–5.1)
Sodium: 141 mmol/L (ref 135–145)
Total Bilirubin: 0.5 mg/dL (ref 0.0–1.2)
Total Protein: 7.2 g/dL (ref 6.5–8.1)

## 2023-10-22 MED ORDER — ONDANSETRON HCL 4 MG PO TABS
4.0000 mg | ORAL_TABLET | Freq: Once | ORAL | Status: AC
  Administered 2023-10-22: 4 mg via ORAL
  Filled 2023-10-22: qty 1

## 2023-10-22 MED ORDER — ONDANSETRON HCL 4 MG PO TABS: 4.0000 mg | ORAL_TABLET | Freq: Three times a day (TID) | ORAL | Status: DC | PRN

## 2023-10-22 MED ORDER — OXYCODONE-ACETAMINOPHEN 5-325 MG PO TABS
1.0000 | ORAL_TABLET | Freq: Once | ORAL | Status: AC
  Administered 2023-10-22: 1 via ORAL
  Filled 2023-10-22: qty 1

## 2023-10-22 MED ORDER — OXYCODONE-ACETAMINOPHEN 5-325 MG PO TABS: 1.0000 | ORAL_TABLET | Freq: Four times a day (QID) | ORAL | 0 refills | Status: DC | PRN

## 2023-10-22 NOTE — ED Triage Notes (Signed)
 Pt c/o same pain I've been having but it's worse cause my period's coming. Advises nausea, vomiting, my lower back feels like it's gonna cave in. Sees pain specialist but it's not working yet, they're talking about surgery.   No meds PTA

## 2023-10-22 NOTE — Discharge Instructions (Addendum)
 You were seen today for chronic lower abdominal pain likely secondary to your uterine fibroids and endometriosis.  I am providing you with Percocet, which is a lower dose than the previous ones you have been given at this time.  Recommend you continue to follow-up with OB/GYN for further evaluation and talks about hysterectomy as this will likely be the best solution for your current problems.  Please return to the ED if you been having new or worsening symptoms.

## 2023-10-22 NOTE — ED Notes (Signed)
 Answered call light, pt ask when someone is coming to see her. Gave the pt a gown and ask she put it on. PA came in while in the room with the pt.

## 2023-10-22 NOTE — ED Provider Notes (Signed)
 Wanda Nixon Provider Note   CSN: [id number] Arrival date & time: 10/22/23  1845     Patient presents with: Abdominal Pain and Back Pain   Wanda Nixon is a 33 y.o. female.  Abdominal Pain Back Pain Associated symptoms: abdominal pain    Patient is a 34 year old female presents the ED today with complaints of lower abdominal pain, low back pain that is been present for the last 3 days, noting to have this be a constant every month near her cycle as she does have extensive history of endometriosis, uterine fibroids.  She states she is in conversation with her OB/GYN to be seen later this week for talks of hysterectomy as this seems to be the only solution for the pain that she recurrently experiences.  States that she has had repeated episodes of vomiting which is common for her when she has these flareups.  Notes that she is about to have her period and that this is likely the cause of her symptoms today.  Denies fever, headache, vision changes, chest pain, shortness of breath, dysuria, vaginal bleeding, vaginal discharge, diarrhea, melena, hematochezia, lower leg swelling.      Prior to Admission medications   Medication Sig Start Date End Date Taking? Authorizing Provider  citalopram  (CELEXA ) 20 MG tablet Take 1 tablet (20 mg total) by mouth daily. 05/31/23   Lafe Domino, DO  ondansetron  (ZOFRAN ) 4 MG tablet Take 1 tablet (4 mg total) by mouth every 8 (eight) hours as needed for nausea or vomiting. 10/22/23  Yes Vernis Eid S, PA-C  oxyCODONE -acetaminophen  (PERCOCET/ROXICET) 5-325 MG tablet Take 1 tablet by mouth every 6 (six) hours as needed for severe pain (pain score 7-10). 10/22/23  Yes Beola Terrall RAMAN, PA-C  albuterol  (VENTOLIN  HFA) 108 (90 Base) MCG/ACT inhaler Inhale 2 puffs into the lungs every 6 (six) hours as needed for wheezing or shortness of breath. 05/06/22   Bryan Bianchi, MD  clonazePAM  (KLONOPIN ) 0.5 MG tablet Take 1  tablet (0.5 mg total) by mouth 2 (two) times daily as needed for anxiety. 11/10/22   Christia Budds, MD  cyclobenzaprine  (FLEXERIL ) 10 MG tablet Take 1 tablet (10 mg total) by mouth 2 (two) times daily as needed for muscle spasms. 01/26/23   Victor Lynwood DASEN, PA-C  ibuprofen  (ADVIL ) 800 MG tablet Take 1 tablet (800 mg total) by mouth 3 (three) times daily. 10/22/22   Mesner, Selinda, MD  lidocaine  (LIDODERM ) 5 % Place 1 patch onto the skin daily. Remove & Discard patch within 12 hours or as directed by MD 01/26/23   Victor Lynwood DASEN, PA-C  meclizine  (ANTIVERT ) 25 MG tablet Take 1 tablet (25 mg total) by mouth 3 (three) times daily as needed. 01/23/23   Clark, Meghan R, PA-C  ondansetron  (ZOFRAN -ODT) 4 MG disintegrating tablet Take 1 tablet (4 mg total) by mouth every 8 (eight) hours as needed for nausea or vomiting. 08/02/23   Franaszek, Amanda, PA-C  oxyCODONE -acetaminophen  (PERCOCET/ROXICET) 5-325 MG tablet Take 1 tablet by mouth every 6 (six) hours as needed for severe pain (pain score 7-10). 09/29/23   Zelaya, Oscar A, PA-C  senna-docusate (SENOKOT-S) 8.6-50 MG tablet Take 1 tablet by mouth daily. 09/25/22   Kingsley, Victoria K, DO  triamcinolone  cream (KENALOG ) 0.1 % APPLY TO AFFECTED AREA TWICE A DAY 08/27/22   Malvina Ellen, MD  Vitamin D , Ergocalciferol , (DRISDOL ) 1.25 MG (50000 UNIT) CAPS capsule TAKE 1 CAPSULE BY MOUTH EVERY 7 DAYS. WHEN YOU RUN OUT OF  THESE 12 TABLETS, PLEASE RETURN TO CLINIC FOR VIT. D LEVEL RECHECK. 08/23/22   Malvina Ellen, MD    Allergies: Tomato, Augmentin  [amoxicillin -pot clavulanate], Mushroom extract complex (obsolete), and Reglan  [metoclopramide ]    Review of Systems  Gastrointestinal:  Positive for abdominal pain.  Musculoskeletal:  Positive for back pain.  All other systems reviewed and are negative.   Updated Vital Signs BP 131/87   Pulse 94   Temp 99.1 F (37.3 C)   Resp 16   LMP 09/29/2023 (Exact Date)   SpO2 99%   Physical Exam Vitals and nursing  note reviewed.  Constitutional:      General: She is not in acute distress.    Appearance: Normal appearance. She is not ill-appearing.  HENT:     Head: Normocephalic and atraumatic.   Eyes:     General:        Right eye: No discharge.        Left eye: No discharge.     Extraocular Movements: Extraocular movements intact.     Conjunctiva/sclera: Conjunctivae normal.    Cardiovascular:     Rate and Rhythm: Normal rate and regular rhythm.     Pulses: Normal pulses.     Heart sounds: Normal heart sounds. No murmur heard.    No friction rub. No gallop.  Pulmonary:     Effort: Pulmonary effort is normal. No respiratory distress.     Breath sounds: Normal breath sounds. No stridor. No wheezing, rhonchi or rales.  Chest:     Chest wall: No tenderness.  Abdominal:     General: Abdomen is flat.     Palpations: Abdomen is soft.     Tenderness: There is abdominal tenderness in the right lower quadrant, suprapubic area and left lower quadrant. There is no right CVA tenderness, left CVA tenderness, guarding or rebound. Negative signs include Murphy's sign.   Musculoskeletal:     Cervical back: Normal range of motion and neck supple. No rigidity.     Right lower leg: No edema.     Left lower leg: No edema.   Skin:    General: Skin is warm and dry.     Coloration: Skin is not cyanotic, jaundiced or mottled.     Findings: No erythema.   Neurological:     General: No focal deficit present.     Mental Status: She is alert and oriented to person, place, and time. Mental status is at baseline.     Cranial Nerves: No cranial nerve deficit.     Sensory: No sensory deficit.     Motor: No weakness.     Gait: Gait normal.   Psychiatric:        Mood and Affect: Mood normal.     (all labs ordered are listed, but only abnormal results are displayed) Labs Reviewed  CBC WITH DIFFERENTIAL/PLATELET - Abnormal; Notable for the following components:      Result Value   HCT 34.3 (*)    All  other components within normal limits  URINALYSIS, ROUTINE W REFLEX MICROSCOPIC - Abnormal; Notable for the following components:   Hgb urine dipstick SMALL (*)    All other components within normal limits  COMPREHENSIVE METABOLIC PANEL WITH GFR    EKG: None  Radiology: No results found.  Procedures   Medications Ordered in the ED  oxyCODONE -acetaminophen  (PERCOCET/ROXICET) 5-325 MG per tablet 1 tablet (1 tablet Oral Given 10/22/23 1951)  ondansetron  (ZOFRAN ) tablet 4 mg (4 mg Oral Given 10/22/23 2007)  Medical Decision Making Amount and/or Complexity of Data Reviewed Labs: ordered.  Risk Prescription drug management.   This patient is a 33 year old female who presents to the ED for concern of chronic bilateral lower abdominal pain and low back pain that is similar to previous presentations and recur monthly with extensive history of uterine fibroids and endometriosis, scheduled for a hysterectomy conversation with OB/GYN later this week.   On physical exam, patient is in no acute distress, afebrile, alert and orient x 4, speaking in full sentences, nontachypneic, nontachycardic.  Notably tender to the lower abdomen as well as low back.  No CVA tenderness, LCTAB, RRR, no murmur.  Unremarkable exam otherwise.  With patient is having recurrence of these symptoms, with no new changes or worsening changes, will obtain baseline labs to assess and provide Percocet.  As this had worked in the past.  On reevaluation, notes that her pain is greatly improved, for Zofran  as well which also improved symptoms.  She states that she has been given 15 mg oxycodone  at home which are too strong for her because she has kids and does not wish to have these.  With pain control this time, and her following up with OB/GYN for talk about hysterectomy.  Will recommend that she continue to follow-up and we will provide Norco, short course for her to use in the interim  before her conversation with her OB/GYN later this week.  OB/GYN will further control pain medications from here on out.  Patient vital signs have remained stable throughout the course of patient's time in the ED. Low suspicion for any other emergent pathology at this time. I believe this patient is safe to be discharged. Provided strict return to ER precautions. Patient expressed agreement and understanding of plan. All questions were answered.  Differential diagnoses prior to evaluation: The emergent differential diagnosis includes, but is not limited to, endometriosis, uterine fibroids, ovarian torsion, diverticulitis, appendicitis, kidney stones, pyelonephritis. This is not an exhaustive differential.   Past Medical History / Co-morbidities / Social History: Uterine fibroids, endometriosis, migraine, GAD, asthma  Additional history: Chart reviewed. Pertinent results include:   Noted to have 10 ED visits in the last 6 months.  Seen on 09/29/2023 for endometriosis, noted to have also a history of uterine fibroid as well as chronic pelvic pain.  Also seen by pain management.  States that pain at that time is primarily lower abdomen and severe flareups happen when she is coming up on her menstrual cycle, noted to have caused vomiting at that time.  Provided a short dose of Percocet.  Noted to have responded well to Percocet.  Lab Tests/Imaging studies: I personally interpreted labs/imaging and the pertinent results include: CBC unremarkable CMP unremarkable UA unremarkable  Medications: I ordered medication including Norco, Zofran .  I have reviewed the patients home medicines and have made adjustments as needed.  Critical Interventions: None  Social Determinants of Health: Has good follow-up with OB/GYN, having appoint later this week  Disposition: After consideration of the diagnostic results and the patients response to treatment, I feel that the patient would benefit from discharge  and treatment as above.   emergency department workup does not suggest an emergent condition requiring admission or immediate intervention beyond what has been performed at this time. The plan is: Follow-up with OB/GYN, short course of pain medication, Zofran ,, return to ED for any new or worsening symptoms. The patient is safe for discharge and has been instructed to return immediately for worsening symptoms, change  in symptoms or any other concerns.     Final diagnoses:  Lower abdominal pain    ED Discharge Orders          Ordered    oxyCODONE -acetaminophen  (PERCOCET/ROXICET) 5-325 MG tablet  Every 6 hours PRN        10/22/23 2140    ondansetron  (ZOFRAN ) 4 MG tablet  Every 8 hours PRN        10/22/23 2143               Beola Terrall RAMAN, PA-C 10/22/23 2144    Neysa Caron PARAS, DO 10/22/23 2327

## 2023-11-07 DIAGNOSIS — M79644 Pain in right finger(s): Secondary | ICD-10-CM | POA: Diagnosis not present

## 2023-11-07 DIAGNOSIS — W231XXA Caught, crushed, jammed, or pinched between stationary objects, initial encounter: Secondary | ICD-10-CM | POA: Diagnosis not present

## 2023-11-07 DIAGNOSIS — S6991XA Unspecified injury of right wrist, hand and finger(s), initial encounter: Secondary | ICD-10-CM | POA: Diagnosis not present

## 2023-11-10 ENCOUNTER — Encounter (HOSPITAL_BASED_OUTPATIENT_CLINIC_OR_DEPARTMENT_OTHER): Payer: Self-pay | Admitting: Emergency Medicine

## 2023-11-10 ENCOUNTER — Other Ambulatory Visit: Payer: Self-pay

## 2023-11-10 ENCOUNTER — Emergency Department (HOSPITAL_BASED_OUTPATIENT_CLINIC_OR_DEPARTMENT_OTHER)
Admission: EM | Admit: 2023-11-10 | Discharge: 2023-11-10 | Disposition: A | Discharge status: Home / Self Care | Attending: Emergency Medicine | Admitting: Emergency Medicine

## 2023-11-10 DIAGNOSIS — G8929 Other chronic pain: Secondary | ICD-10-CM

## 2023-11-10 DIAGNOSIS — J45909 Unspecified asthma, uncomplicated: Secondary | ICD-10-CM | POA: Insufficient documentation

## 2023-11-10 DIAGNOSIS — R1030 Lower abdominal pain, unspecified: Secondary | ICD-10-CM | POA: Diagnosis not present

## 2023-11-10 DIAGNOSIS — N809 Endometriosis, unspecified: Secondary | ICD-10-CM | POA: Diagnosis not present

## 2023-11-10 LAB — CBC WITH DIFFERENTIAL/PLATELET
Abs Immature Granulocytes: 0.02 K/uL (ref 0.00–0.07)
Basophils Absolute: 0 K/uL (ref 0.0–0.1)
Basophils Relative: 0 %
Eosinophils Absolute: 0 K/uL (ref 0.0–0.5)
Eosinophils Relative: 1 %
HCT: 36.5 % (ref 36.0–46.0)
Hemoglobin: 12.7 g/dL (ref 12.0–15.0)
Immature Granulocytes: 0 %
Lymphocytes Relative: 24 %
Lymphs Abs: 1.2 K/uL (ref 0.7–4.0)
MCH: 29.3 pg (ref 26.0–34.0)
MCHC: 34.8 g/dL (ref 30.0–36.0)
MCV: 84.1 fL (ref 80.0–100.0)
Monocytes Absolute: 0.3 K/uL (ref 0.1–1.0)
Monocytes Relative: 7 %
Neutro Abs: 3.3 K/uL (ref 1.7–7.7)
Neutrophils Relative %: 68 %
Platelets: 290 K/uL (ref 150–400)
RBC: 4.34 MIL/uL (ref 3.87–5.11)
RDW: 12.5 % (ref 11.5–15.5)
WBC: 4.9 K/uL (ref 4.0–10.5)
nRBC: 0 % (ref 0.0–0.2)

## 2023-11-10 LAB — URINALYSIS, ROUTINE W REFLEX MICROSCOPIC
Bacteria, UA: NONE SEEN
Bilirubin Urine: NEGATIVE
Glucose, UA: NEGATIVE mg/dL
Hgb urine dipstick: NEGATIVE
Ketones, ur: NEGATIVE mg/dL
Nitrite: NEGATIVE
Specific Gravity, Urine: 1.025 (ref 1.005–1.030)
pH: 6 (ref 5.0–8.0)

## 2023-11-10 LAB — BASIC METABOLIC PANEL WITH GFR
Anion gap: 12 (ref 5–15)
BUN: 12 mg/dL (ref 6–20)
CO2: 21 mmol/L — ABNORMAL LOW (ref 22–32)
Calcium: 9.9 mg/dL (ref 8.9–10.3)
Chloride: 106 mmol/L (ref 98–111)
Creatinine, Ser: 0.85 mg/dL (ref 0.44–1.00)
GFR, Estimated: >60 mL/min (ref >60)
Glucose, Bld: 77 mg/dL (ref 70–99)
Potassium: 3.9 mmol/L (ref 3.5–5.1)
Sodium: 140 mmol/L (ref 135–145)

## 2023-11-10 LAB — PREGNANCY, URINE: Preg Test, Ur: NEGATIVE

## 2023-11-10 MED ORDER — ONDANSETRON 4 MG PO TBDP
4.0000 mg | ORAL_TABLET | Freq: Once | ORAL | Status: AC
  Administered 2023-11-10: 4 mg via ORAL
  Filled 2023-11-10: qty 1

## 2023-11-10 MED ORDER — OXYCODONE-ACETAMINOPHEN 5-325 MG PO TABS
1.0000 | ORAL_TABLET | Freq: Once | ORAL | Status: AC
  Administered 2023-11-10: 1 via ORAL
  Filled 2023-11-10: qty 1

## 2023-11-10 MED ORDER — OXYCODONE-ACETAMINOPHEN 5-325 MG PO TABS: 1.0000 | ORAL_TABLET | Freq: Four times a day (QID) | ORAL | 0 refills | Status: AC | PRN

## 2023-11-10 NOTE — ED Notes (Signed)
 DC paperwork given and verbally understood.

## 2023-11-10 NOTE — ED Triage Notes (Signed)
 Abdo pain, lower endometriosis Was controled with pain meds  Out of meds Unable to get an appt Pain uncontrolled x 3 days Scheduled for surgery waiting for insurance/ financial clearance

## 2023-11-10 NOTE — ED Notes (Signed)
 Pt aware of he need for a urine... Pt currently unable to provide a sample.SABRASABRA

## 2023-11-10 NOTE — Discharge Instructions (Addendum)
 Please follow-up with your pain management provider, OB/GYN for further management.  I provided you with a short course of pain medicine to get you to next week at your appointment.  Return to the Emergency Department if you experience unilateral severe pelvic pain that seems different from your typical endometriosis to suggest possible ectopic pregnancy, or worsening symptoms

## 2023-11-10 NOTE — ED Provider Notes (Signed)
 [place] EMERGENCY DEPARTMENT AT [facility] Provider Note   CSN: [id number] Arrival date & time: 11/10/23  1415     Patient presents with: Abdominal Pain   Wanda Nixon is a 33 y.o. female with past medical history of migraine, asthma, GAD, eczema, endometriosis, bilateral fallopian tube removal presents to emergency department for evaluation of lower abdominal pain that radiates into the back that has been persisting for the past 5 days.  Has associated vomiting, difficulty sleeping.  Reports pain typically happens 1-2 weeks following and this feels similar.  She follows her OB/GYN, pain specialist ([facility] in [place]) who prescribed Percocet for chronic pain.  She ran out of her Percocet and was unable to see pain specialist as he is out of the country and does not have another appointment until next Friday.  She has a hysterectomy scheduled on 01/16/2024 for chronic abdominal pain secondary to endometriosis.  LMP last week.  Denies STD exposure, bleeding, vaginal discharge, urinary symptoms, hematuria, history of stones  Of note, has been seen in ED 10 times since 08/02/2023 for similar complaints.  Had normal CT on 08/27/2023     Abdominal Pain      Prior to Admission medications   Medication Sig Start Date End Date Taking? Authorizing Provider  citalopram  (CELEXA ) 20 MG tablet Take 1 tablet (20 mg total) by mouth daily. 05/31/23   Lafe Domino, DO  oxyCODONE -acetaminophen  (PERCOCET/ROXICET) 5-325 MG tablet Take 1 tablet by mouth every 6 (six) hours as needed for up to 5 days for severe pain (pain score 7-10). 11/10/23 11/15/23 Yes Minnie Tinnie BRAVO, PA  albuterol  (VENTOLIN  HFA) 108 (90 Base) MCG/ACT inhaler Inhale 2 puffs into the lungs every 6 (six) hours as needed for wheezing or shortness of breath. 05/06/22   Bryan Bianchi, MD  clonazePAM  (KLONOPIN ) 0.5 MG tablet Take 1 tablet (0.5 mg total) by mouth 2 (two) times daily as needed for anxiety. 11/10/22    Christia Budds, MD  cyclobenzaprine  (FLEXERIL ) 10 MG tablet Take 1 tablet (10 mg total) by mouth 2 (two) times daily as needed for muscle spasms. 01/26/23   Victor Lynwood DASEN, PA-C  ibuprofen  (ADVIL ) 800 MG tablet Take 1 tablet (800 mg total) by mouth 3 (three) times daily. 10/22/22   Mesner, Selinda, MD  lidocaine  (LIDODERM ) 5 % Place 1 patch onto the skin daily. Remove & Discard patch within 12 hours or as directed by MD 01/26/23   Victor Lynwood DASEN, PA-C  meclizine  (ANTIVERT ) 25 MG tablet Take 1 tablet (25 mg total) by mouth 3 (three) times daily as needed. 01/23/23   Clark, Meghan R, PA-C  ondansetron  (ZOFRAN ) 4 MG tablet Take 1 tablet (4 mg total) by mouth every 8 (eight) hours as needed for nausea or vomiting. 10/22/23   Bauer, Collin S, PA-C  ondansetron  (ZOFRAN -ODT) 4 MG disintegrating tablet Take 1 tablet (4 mg total) by mouth every 8 (eight) hours as needed for nausea or vomiting. 08/02/23   Franaszek, Amanda, PA-C  oxyCODONE -acetaminophen  (PERCOCET/ROXICET) 5-325 MG tablet Take 1 tablet by mouth every 6 (six) hours as needed for severe pain (pain score 7-10). 09/29/23   Zelaya, Oscar A, PA-C  oxyCODONE -acetaminophen  (PERCOCET/ROXICET) 5-325 MG tablet Take 1 tablet by mouth every 6 (six) hours as needed for severe pain (pain score 7-10). 10/22/23   Bauer, Collin S, PA-C  senna-docusate (SENOKOT-S) 8.6-50 MG tablet Take 1 tablet by mouth daily. 09/25/22   Kingsley, Victoria K, DO  triamcinolone  cream (KENALOG ) 0.1 % APPLY  TO AFFECTED AREA TWICE A DAY 08/27/22   Malvina Ellen, MD  Vitamin D , Ergocalciferol , (DRISDOL ) 1.25 MG (50000 UNIT) CAPS capsule TAKE 1 CAPSULE BY MOUTH EVERY 7 DAYS. WHEN YOU RUN OUT OF THESE 12 TABLETS, PLEASE RETURN TO CLINIC FOR VIT. D LEVEL RECHECK. 08/23/22   Malvina Ellen, MD    Allergies: Tomato, Augmentin  [amoxicillin -pot clavulanate], Mushroom extract complex (obsolete), and Reglan  [metoclopramide ]    Review of Systems  Gastrointestinal:  Positive for abdominal pain.     Updated Vital Signs BP 115/89 (BP Location: Right Arm)   Pulse 77   Temp 98.9 F (37.2 C) (Oral)   Resp 16   SpO2 100%   Physical Exam Vitals and nursing note reviewed.  Constitutional:      General: She is not in acute distress.    Appearance: Normal appearance.  HENT:     Head: Normocephalic and atraumatic.  Eyes:     Conjunctiva/sclera: Conjunctivae normal.  Cardiovascular:     Rate and Rhythm: Normal rate.  Pulmonary:     Effort: Pulmonary effort is normal. No respiratory distress.     Breath sounds: Normal breath sounds.  Chest:     Chest wall: No tenderness.  Abdominal:     General: Bowel sounds are normal.     Tenderness: There is abdominal tenderness in the right lower quadrant, suprapubic area and left lower quadrant. There is no right CVA tenderness, left CVA tenderness, guarding or rebound.     Comments: Nonsurgical abdomen with no peritoneal signs.  No ecchymosis to abdomen nor retroperitoneum  Genitourinary:    Comments: Pt defers GU exam Musculoskeletal:     Right lower leg: No edema.     Left lower leg: No edema.  Skin:    General: Skin is warm.     Capillary Refill: Capillary refill takes less than 2 seconds.     Coloration: Skin is not jaundiced or pale.  Neurological:     Mental Status: She is alert and oriented to person, place, and time. Mental status is at baseline.     (all labs ordered are listed, but only abnormal results are displayed) Labs Reviewed  URINALYSIS, ROUTINE W REFLEX MICROSCOPIC - Abnormal; Notable for the following components:      Result Value   Protein, ur TRACE (*)    Leukocytes,Ua MODERATE (*)    All other components within normal limits  BASIC METABOLIC PANEL WITH GFR - Abnormal; Notable for the following components:   CO2 21 (*)    All other components within normal limits  PREGNANCY, URINE  CBC WITH DIFFERENTIAL/PLATELET  CBC WITH DIFFERENTIAL/PLATELET    EKG: None  Radiology: No results  found.   Medications Ordered in the ED  oxyCODONE -acetaminophen  (PERCOCET/ROXICET) 5-325 MG per tablet 1 tablet (1 tablet Oral Given 11/10/23 1616)  ondansetron  (ZOFRAN -ODT) disintegrating tablet 4 mg (4 mg Oral Given 11/10/23 1616)                                    Medical Decision Making Amount and/or Complexity of Data Reviewed Labs: ordered.  Risk Prescription drug management.   Patient presents to the ED for concern of lower abdominal pain, this involves an extensive number of treatment options, and is a complaint that carries with it a high risk of complications and morbidity.  The differential diagnosis includes appendicitis, diverticulitis, ovarian cyst, ectopic pregnancy, endometriosis, chronic abdominal pain, ovarian torsion  Co morbidities that complicate the patient evaluation  Chronic pelvic pain, endometriosis   Additional history obtained:  Additional history obtained from Nursing and Outside Medical Records   External records from outside source obtained and reviewed including triage note, recent ED note for similar, recent CT   Lab Tests:  I Ordered, and personally interpreted labs.  The pertinent results include:   No leukocytosis nor anemia UA contaminated but does not appear grossly infected especially with no urinary symptoms hCG negative    Medicines ordered and prescription drug management:  I ordered medication including Percocet, Zofran  for pain, nausea Reevaluation of the patient after these medicines showed that the patient improved I have reviewed the patients home medicines and have made adjustments as needed   Test Considered:  TVUS, CT abdomen pelvis     Problem List / ED Course:  Endometriosis Lower abd pain Reports that pain is similar to previous chronic pain in the past.  Has been seen multiple times for this.  Resolved following Percocet Shared decision-making is had with patient regarding obtaining TVUS to rule out  ovarian torsion or CT abdomen to rule out intra-abdominal pathology as etiology of pain.  Patient does not wish to proceed with imaging at this time as she believes this is similar to her endometriosis in the past.  I think this is reasonable as she does not have unilateral pelvic pain nor gross leukocytosis nor anemia Patient also defers GU exam hCG negative so ectopic pregnancy low suspicion Patient has appointment with pain specialist next week to obtain pain medicine.  Will follow-up with OB/GYN regarding hysterectomy for chronic pelvic pain   Reevaluation:  After the interventions noted above, I reevaluated the patient and found that they have :improved   Social Determinants of Health:  Has PCP, pain management, OB/GYN follow-up   Dispostion:  After consideration of the diagnostic results and the patients response to treatment, I feel that the patent would benefit from outpatient management with pain management OB/GYN follow-up.   Discussed ED workup, disposition, return to ED precautions with patient who expresses understanding agrees with plan.  All questions answered to their satisfaction.  They are agreeable to plan.  Discharge instructions provided on paperwork  Final diagnoses:  Chronic pelvic pain in female  Endometriosis    ED Discharge Orders          Ordered    oxyCODONE -acetaminophen  (PERCOCET/ROXICET) 5-325 MG tablet  Every 6 hours PRN        11/10/23 1750               Minnie Tinnie BRAVO, PA 11/10/23 1755    Randol Simmonds, MD 11/13/23 [phone]

## 2023-11-16 ENCOUNTER — Encounter (HOSPITAL_BASED_OUTPATIENT_CLINIC_OR_DEPARTMENT_OTHER): Payer: Self-pay

## 2023-11-16 ENCOUNTER — Other Ambulatory Visit (HOSPITAL_BASED_OUTPATIENT_CLINIC_OR_DEPARTMENT_OTHER): Payer: Self-pay

## 2023-11-16 ENCOUNTER — Emergency Department (HOSPITAL_BASED_OUTPATIENT_CLINIC_OR_DEPARTMENT_OTHER)
Admission: EM | Admit: 2023-11-16 | Discharge: 2023-11-16 | Disposition: A | Discharge status: Home / Self Care | Attending: Emergency Medicine | Admitting: Emergency Medicine

## 2023-11-16 ENCOUNTER — Other Ambulatory Visit: Payer: Self-pay

## 2023-11-16 DIAGNOSIS — G8929 Other chronic pain: Secondary | ICD-10-CM | POA: Insufficient documentation

## 2023-11-16 DIAGNOSIS — J45909 Unspecified asthma, uncomplicated: Secondary | ICD-10-CM | POA: Insufficient documentation

## 2023-11-16 DIAGNOSIS — R102 Pelvic and perineal pain: Secondary | ICD-10-CM | POA: Insufficient documentation

## 2023-11-16 DIAGNOSIS — Z7951 Long term (current) use of inhaled steroids: Secondary | ICD-10-CM | POA: Insufficient documentation

## 2023-11-16 DIAGNOSIS — D72829 Elevated white blood cell count, unspecified: Secondary | ICD-10-CM | POA: Insufficient documentation

## 2023-11-16 LAB — COMPREHENSIVE METABOLIC PANEL WITH GFR
ALT: 9 U/L (ref 0–44)
AST: 17 U/L (ref 15–41)
Albumin: 4.8 g/dL (ref 3.5–5.0)
Alkaline Phosphatase: 50 U/L (ref 38–126)
Anion gap: 12 (ref 5–15)
BUN: 11 mg/dL (ref 6–20)
CO2: 21 mmol/L — ABNORMAL LOW (ref 22–32)
Calcium: 9.7 mg/dL (ref 8.9–10.3)
Chloride: 107 mmol/L (ref 98–111)
Creatinine, Ser: 0.78 mg/dL (ref 0.44–1.00)
GFR, Estimated: >60 mL/min (ref >60)
Glucose, Bld: 83 mg/dL (ref 70–99)
Potassium: 3.7 mmol/L (ref 3.5–5.1)
Sodium: 140 mmol/L (ref 135–145)
Total Bilirubin: 0.6 mg/dL (ref 0.0–1.2)
Total Protein: 7.8 g/dL (ref 6.5–8.1)

## 2023-11-16 LAB — CBC WITH DIFFERENTIAL/PLATELET
Abs Immature Granulocytes: 0 K/uL (ref 0.00–0.07)
Basophils Absolute: 0 K/uL (ref 0.0–0.1)
Basophils Relative: 0 %
Eosinophils Absolute: 0 K/uL (ref 0.0–0.5)
Eosinophils Relative: 1 %
HCT: 35.6 % — ABNORMAL LOW (ref 36.0–46.0)
Hemoglobin: 12.5 g/dL (ref 12.0–15.0)
Immature Granulocytes: 0 %
Lymphocytes Relative: 23 %
Lymphs Abs: 1 K/uL (ref 0.7–4.0)
MCH: 29.6 pg (ref 26.0–34.0)
MCHC: 35.1 g/dL (ref 30.0–36.0)
MCV: 84.2 fL (ref 80.0–100.0)
Monocytes Absolute: 0.3 K/uL (ref 0.1–1.0)
Monocytes Relative: 7 %
Neutro Abs: 2.9 K/uL (ref 1.7–7.7)
Neutrophils Relative %: 69 %
Platelets: 300 K/uL (ref 150–400)
RBC: 4.23 MIL/uL (ref 3.87–5.11)
RDW: 12.8 % (ref 11.5–15.5)
WBC: 4.2 K/uL (ref 4.0–10.5)
nRBC: 0 % (ref 0.0–0.2)

## 2023-11-16 LAB — URINALYSIS, W/ REFLEX TO CULTURE (INFECTION SUSPECTED)
Bilirubin Urine: NEGATIVE
Glucose, UA: NEGATIVE mg/dL
Ketones, ur: NEGATIVE mg/dL
Nitrite: NEGATIVE
Protein, ur: NEGATIVE mg/dL
Specific Gravity, Urine: 1.028 (ref 1.005–1.030)
pH: 5.5 (ref 5.0–8.0)

## 2023-11-16 LAB — LIPASE, BLOOD: Lipase: 18 U/L (ref 11–51)

## 2023-11-16 LAB — PREGNANCY, URINE: Preg Test, Ur: NEGATIVE

## 2023-11-16 MED ORDER — OXYCODONE-ACETAMINOPHEN 5-325 MG PO TABS
2.0000 | ORAL_TABLET | ORAL | 0 refills | Status: DC | PRN
  Filled 2023-11-16: qty 6, 1d supply, fill #0

## 2023-11-16 MED ORDER — OXYCODONE-ACETAMINOPHEN 5-325 MG PO TABS
2.0000 | ORAL_TABLET | Freq: Once | ORAL | Status: AC
  Administered 2023-11-16: 2 via ORAL
  Filled 2023-11-16: qty 2

## 2023-11-16 NOTE — Discharge Instructions (Addendum)
 Follow-up with your pain clinic as discussed.  Return to the emergency room if you have any worsening symptoms.

## 2023-11-16 NOTE — ED Provider Notes (Signed)
 [place] EMERGENCY DEPARTMENT AT [facility] Provider Note   CSN: [id number] Arrival date & time: 11/16/23  1352     Patient presents with: Abdominal Pain   Wanda Nixon is a 33 y.o. female.   Patient is a 33 year old who presents with lower abdominal pain.  She said she has a history of chronic pelvic pain due to endometriosis.  She has been dealing with this for a long time.  She recently started going to a pain clinic in [place].  She was started on buprenorphine patches.  However she was not able to tolerate those because it made her feel loopy at work.  She had a follow-up visit and was started on OxyContin  but was only given a short prescription for this.  She was post to have a nerve block but has not been able to get back into the office because she said that whoever there was out of the country.  She has an upcoming appointment next Thursday on July 24.  However she said she needs some pain medication to help her get through until then.  She denies any fevers.  No urinary symptoms.  She is on her menstrual cycle currently.  She said she has been having some irregular cycles.  She denies any other discharge.  She has had some nausea and sometimes vomiting with the pain which is not atypical for her.       Prior to Admission medications   Medication Sig Start Date End Date Taking? Authorizing Provider  citalopram  (CELEXA ) 20 MG tablet Take 1 tablet (20 mg total) by mouth daily. 05/31/23   Lafe Domino, DO  oxyCODONE -acetaminophen  (PERCOCET) 5-325 MG tablet Take 2 tablets by mouth every 4 (four) hours as needed. 11/16/23  Yes Lenor Hollering, MD  albuterol  (VENTOLIN  HFA) 108 (90 Base) MCG/ACT inhaler Inhale 2 puffs into the lungs every 6 (six) hours as needed for wheezing or shortness of breath. 05/06/22   Bryan Bianchi, MD  clonazePAM  (KLONOPIN ) 0.5 MG tablet Take 1 tablet (0.5 mg total) by mouth 2 (two) times daily as needed for anxiety. 11/10/22   Christia Budds,  MD  cyclobenzaprine  (FLEXERIL ) 10 MG tablet Take 1 tablet (10 mg total) by mouth 2 (two) times daily as needed for muscle spasms. 01/26/23   Victor Lynwood DASEN, PA-C  ibuprofen  (ADVIL ) 800 MG tablet Take 1 tablet (800 mg total) by mouth 3 (three) times daily. 10/22/22   Mesner, Selinda, MD  lidocaine  (LIDODERM ) 5 % Place 1 patch onto the skin daily. Remove & Discard patch within 12 hours or as directed by MD 01/26/23   Victor Lynwood DASEN, PA-C  meclizine  (ANTIVERT ) 25 MG tablet Take 1 tablet (25 mg total) by mouth 3 (three) times daily as needed. 01/23/23   Clark, Meghan R, PA-C  ondansetron  (ZOFRAN ) 4 MG tablet Take 1 tablet (4 mg total) by mouth every 8 (eight) hours as needed for nausea or vomiting. 10/22/23   Bauer, Collin S, PA-C  ondansetron  (ZOFRAN -ODT) 4 MG disintegrating tablet Take 1 tablet (4 mg total) by mouth every 8 (eight) hours as needed for nausea or vomiting. 08/02/23   Franaszek, Amanda, PA-C  oxyCODONE -acetaminophen  (PERCOCET/ROXICET) 5-325 MG tablet Take 1 tablet by mouth every 6 (six) hours as needed for severe pain (pain score 7-10). 09/29/23   Zelaya, Oscar A, PA-C  oxyCODONE -acetaminophen  (PERCOCET/ROXICET) 5-325 MG tablet Take 1 tablet by mouth every 6 (six) hours as needed for severe pain (pain score 7-10). 10/22/23   Beola Terrall RAMAN,  PA-C  senna-docusate (SENOKOT-S) 8.6-50 MG tablet Take 1 tablet by mouth daily. 09/25/22   Kingsley, Victoria K, DO  triamcinolone  cream (KENALOG ) 0.1 % APPLY TO AFFECTED AREA TWICE A DAY 08/27/22   Malvina Ellen, MD  Vitamin D , Ergocalciferol , (DRISDOL ) 1.25 MG (50000 UNIT) CAPS capsule TAKE 1 CAPSULE BY MOUTH EVERY 7 DAYS. WHEN YOU RUN OUT OF THESE 12 TABLETS, PLEASE RETURN TO CLINIC FOR VIT. D LEVEL RECHECK. 08/23/22   Malvina Ellen, MD    Allergies: Tomato, Augmentin  [amoxicillin -pot clavulanate], Mushroom extract complex (obsolete), and Reglan  [metoclopramide ]    Review of Systems  Constitutional:  Negative for fatigue and fever.  Gastrointestinal:   Positive for abdominal pain, diarrhea and vomiting.  Genitourinary:  Negative for dysuria, flank pain and urgency.  Musculoskeletal:  Negative for back pain.    Updated Vital Signs BP (!) 116/94   Pulse 78   Temp 98.2 F (36.8 C)   Resp 16   LMP 10/15/2023   SpO2 100%   Physical Exam Constitutional:      Appearance: She is well-developed.  HENT:     Head: Normocephalic and atraumatic.  Eyes:     Pupils: Pupils are equal, round, and reactive to light.  Cardiovascular:     Rate and Rhythm: Normal rate and regular rhythm.     Heart sounds: Normal heart sounds.  Pulmonary:     Effort: Pulmonary effort is normal. No respiratory distress.     Breath sounds: Normal breath sounds. No wheezing or rales.  Chest:     Chest wall: No tenderness.  Abdominal:     General: Bowel sounds are normal.     Palpations: Abdomen is soft.     Tenderness: There is abdominal tenderness in the suprapubic area. There is no guarding or rebound.  Musculoskeletal:        General: Normal range of motion.     Cervical back: Normal range of motion and neck supple.  Lymphadenopathy:     Cervical: No cervical adenopathy.  Skin:    General: Skin is warm and dry.     Findings: No rash.  Neurological:     Mental Status: She is alert and oriented to person, place, and time.     (all labs ordered are listed, but only abnormal results are displayed) Labs Reviewed  CBC WITH DIFFERENTIAL/PLATELET - Abnormal; Notable for the following components:      Result Value   HCT 35.6 (*)    All other components within normal limits  COMPREHENSIVE METABOLIC PANEL WITH GFR - Abnormal; Notable for the following components:   CO2 21 (*)    All other components within normal limits  URINALYSIS, W/ REFLEX TO CULTURE (INFECTION SUSPECTED) - Abnormal; Notable for the following components:   Hgb urine dipstick SMALL (*)    Leukocytes,Ua MODERATE (*)    Bacteria, UA FEW (*)    All other components within normal limits   LIPASE, BLOOD  PREGNANCY, URINE    EKG: None  Radiology: No results found.   Procedures   Medications Ordered in the ED  oxyCODONE -acetaminophen  (PERCOCET/ROXICET) 5-325 MG per tablet 2 tablet (has no administration in time range)                                    Medical Decision Making Risk Prescription drug management.   This patient presents to the ED for concern of pelvic pain, this involves an  extensive number of treatment options, and is a complaint that carries with it a high risk of complications and morbidity.  I considered the following differential and admission for this acute, potentially life threatening condition.  The differential diagnosis includes chronic pain, UTI, vaginal infection, STD, appendicitis, colitis, ovarian torsion  MDM:    Patient presents with pain similar to her chronic pain.  She has had prior imaging for this and says that she is not having any new or unusual type symptoms.  She has had a hard time getting pain management through the current pain clinic that she is going to.  She has been in the ED several times for pain prescriptions.  I did have a long discussion with her regarding this and she does acknowledge that she cannot continue to get pain medication through the ED and through different providers.  She does have an upcoming appointment on Thursday.  She also was going to discuss with her OB/GYN about helping her to manage to her symptoms as well.  Labs reviewed and are nonconcerning.  Will give her a short course of oxycodone  but I did advise her that we cannot continue to provide pain medication through the emergency department.  She acknowledges this.  She was discharged home in good condition.  Return precautions were given.  (Labs, imaging, consults)  Labs: I Ordered, and personally interpreted labs.  The pertinent results include: Normal white count, urine has some leukocytes but also squamous cells, is not consistent with  infection  Imaging Studies ordered: I ordered imaging studies including none I independently visualized and interpreted imaging. I agree with the radiologist interpretation  Additional history obtained from chart.  External records from outside source obtained and reviewed including prior note  Cardiac Monitoring: The patient was not maintained on a cardiac monitor.  If on the cardiac monitor, I personally viewed and interpreted the cardiac monitored which showed an underlying rhythm of:    Reevaluation: After the interventions noted above, I reevaluated the patient and found that they have :improved  Social Determinants of Health:    Disposition: Discharged to home  Co morbidities that complicate the patient evaluation  Past Medical History:  Diagnosis Date   Asthma    Chronic constipation    Depression    Eczema    GAD (generalized anxiety disorder)    History of anemia    History of migraine    Wears glasses    Wears partial dentures    upper     Medicines Meds ordered this encounter  Medications   oxyCODONE -acetaminophen  (PERCOCET/ROXICET) 5-325 MG per tablet 2 tablet    Refill:  0   oxyCODONE -acetaminophen  (PERCOCET) 5-325 MG tablet    Sig: Take 2 tablets by mouth every 4 (four) hours as needed.    Dispense:  6 tablet    Refill:  0    I have reviewed the patients home medicines and have made adjustments as needed  Problem List / ED Course: Problem List Items Addressed This Visit   None Visit Diagnoses       Chronic pelvic pain in female    -  Primary   Relevant Medications   oxyCODONE -acetaminophen  (PERCOCET/ROXICET) 5-325 MG per tablet 2 tablet (Completed) (Start on 11/16/2023  4:15 PM)   oxyCODONE -acetaminophen  (PERCOCET) 5-325 MG tablet                Final diagnoses:  Chronic pelvic pain in female    ED Discharge Orders  Ordered    oxyCODONE -acetaminophen  (PERCOCET) 5-325 MG tablet  Every 4 hours PRN        11/16/23 1605                Lenor Hollering, MD 11/16/23 1617

## 2023-11-16 NOTE — ED Triage Notes (Signed)
 On-going lower abd pain, hx endometriosis, hysterectomy scheduled for sept, pain specialist has been out of country, soonest avail appt for pain control is next Thurs. Pt c/o ongoing abd pain, NVD. NAD during triage

## 2023-11-21 ENCOUNTER — Emergency Department (HOSPITAL_BASED_OUTPATIENT_CLINIC_OR_DEPARTMENT_OTHER): Admission: EM | Admit: 2023-11-21 | Discharge: 2023-11-21 | Disposition: A | Discharge status: Home / Self Care

## 2023-11-21 ENCOUNTER — Emergency Department (HOSPITAL_BASED_OUTPATIENT_CLINIC_OR_DEPARTMENT_OTHER)

## 2023-11-21 ENCOUNTER — Other Ambulatory Visit: Payer: Self-pay

## 2023-11-21 DIAGNOSIS — R102 Pelvic and perineal pain: Secondary | ICD-10-CM | POA: Insufficient documentation

## 2023-11-21 DIAGNOSIS — R1032 Left lower quadrant pain: Secondary | ICD-10-CM | POA: Diagnosis not present

## 2023-11-21 DIAGNOSIS — G8929 Other chronic pain: Secondary | ICD-10-CM | POA: Diagnosis not present

## 2023-11-21 LAB — COMPREHENSIVE METABOLIC PANEL WITH GFR
ALT: 7 U/L (ref 0–44)
AST: 19 U/L (ref 15–41)
Albumin: 4.3 g/dL (ref 3.5–5.0)
Alkaline Phosphatase: 47 U/L (ref 38–126)
Anion gap: 13 (ref 5–15)
BUN: 9 mg/dL (ref 6–20)
CO2: 21 mmol/L — ABNORMAL LOW (ref 22–32)
Calcium: 9.2 mg/dL (ref 8.9–10.3)
Chloride: 107 mmol/L (ref 98–111)
Creatinine, Ser: 0.78 mg/dL (ref 0.44–1.00)
GFR, Estimated: >60 mL/min (ref >60)
Glucose, Bld: 106 mg/dL — ABNORMAL HIGH (ref 70–99)
Potassium: 3.8 mmol/L (ref 3.5–5.1)
Sodium: 140 mmol/L (ref 135–145)
Total Bilirubin: 0.4 mg/dL (ref 0.0–1.2)
Total Protein: 7.3 g/dL (ref 6.5–8.1)

## 2023-11-21 LAB — URINALYSIS, W/ REFLEX TO CULTURE (INFECTION SUSPECTED)
Bacteria, UA: NONE SEEN
Bilirubin Urine: NEGATIVE
Glucose, UA: NEGATIVE mg/dL
Ketones, ur: NEGATIVE mg/dL
Nitrite: NEGATIVE
Specific Gravity, Urine: 1.035 — ABNORMAL HIGH (ref 1.005–1.030)
pH: 6.5 (ref 5.0–8.0)

## 2023-11-21 LAB — CBC WITH DIFFERENTIAL/PLATELET
Abs Immature Granulocytes: 0.01 K/uL (ref 0.00–0.07)
Basophils Absolute: 0 K/uL (ref 0.0–0.1)
Basophils Relative: 1 %
Eosinophils Absolute: 0.1 K/uL (ref 0.0–0.5)
Eosinophils Relative: 1 %
HCT: 34.9 % — ABNORMAL LOW (ref 36.0–46.0)
Hemoglobin: 11.9 g/dL — ABNORMAL LOW (ref 12.0–15.0)
Immature Granulocytes: 0 %
Lymphocytes Relative: 30 %
Lymphs Abs: 1.3 K/uL (ref 0.7–4.0)
MCH: 29 pg (ref 26.0–34.0)
MCHC: 34.1 g/dL (ref 30.0–36.0)
MCV: 84.9 fL (ref 80.0–100.0)
Monocytes Absolute: 0.2 K/uL (ref 0.1–1.0)
Monocytes Relative: 4 %
Neutro Abs: 2.7 K/uL (ref 1.7–7.7)
Neutrophils Relative %: 64 %
Platelets: 314 K/uL (ref 150–400)
RBC: 4.11 MIL/uL (ref 3.87–5.11)
RDW: 12.3 % (ref 11.5–15.5)
WBC: 4.3 K/uL (ref 4.0–10.5)
nRBC: 0 % (ref 0.0–0.2)

## 2023-11-21 LAB — PREGNANCY, URINE: Preg Test, Ur: NEGATIVE

## 2023-11-21 LAB — LIPASE, BLOOD: Lipase: 22 U/L (ref 11–51)

## 2023-11-21 MED ORDER — HYDROCODONE-ACETAMINOPHEN 5-325 MG PO TABS
1.0000 | ORAL_TABLET | Freq: Once | ORAL | Status: AC
  Administered 2023-11-21: 1 via ORAL
  Filled 2023-11-21: qty 1

## 2023-11-21 MED ORDER — ONDANSETRON 4 MG PO TBDP
4.0000 mg | ORAL_TABLET | Freq: Once | ORAL | Status: AC
  Administered 2023-11-21: 4 mg via ORAL
  Filled 2023-11-21: qty 1

## 2023-11-21 MED ORDER — OXYCODONE-ACETAMINOPHEN 5-325 MG PO TABS: 1.0000 | ORAL_TABLET | Freq: Four times a day (QID) | ORAL | 0 refills | Status: AC | PRN

## 2023-11-21 MED ORDER — ONDANSETRON 4 MG PO TBDP
4.0000 mg | ORAL_TABLET | Freq: Three times a day (TID) | ORAL | 0 refills | Status: DC | PRN
Start: 2023-11-21 — End: 2023-12-28

## 2023-11-21 MED ORDER — OXYCODONE-ACETAMINOPHEN 5-325 MG PO TABS
1.0000 | ORAL_TABLET | Freq: Once | ORAL | Status: AC
  Administered 2023-11-21: 1 via ORAL
  Filled 2023-11-21: qty 1

## 2023-11-21 NOTE — Discharge Instructions (Addendum)
 It was a pleasure taking care of you here today  Make sure to follow-up with your pain management clinic as soon as possible  Follow-up outpatient, return for new or worsening symptoms

## 2023-11-21 NOTE — ED Provider Notes (Signed)
 [place] EMERGENCY DEPARTMENT AT [facility] Provider Note   CSN: [id number] Arrival date & time: 11/21/23  1754    Patient presents with: Abdominal Pain  Wanda Nixon is a 33 y.o. female for evaluation of pelvic pain.  Patient has chronic pelvic pain due to endometriosis.  She is followed with pain management.  Previously on a Butrans patch however was not able to continue with this due to sedation.  Recently completed her menstrual cycle and has had worsening cramping to her lower abdomen.  Feels like her chronic pain.  She was seen on the 17th and prescribed pain medicine she has an appointment with pain management in 2 days.  Seen at urgent care earlier today provided Toradol  however no relief and sent here.  Some nausea that vomiting.  Mild dysuria without hematuria frequency.  Typically alternates Percocets and ibuprofen  however is out of her Percocets.  No changes in bowel movements.  No chest pain, shortness of breath, back pain.  She has had surgery for endometriosis previously however has not talked with her OB/GYN for a total hysterectomy due to her continued pain however is having childcare issues and not able to schedule this at this time.   HPI     Prior to Admission medications   Medication Sig Start Date End Date Taking? Authorizing Provider  citalopram  (CELEXA ) 20 MG tablet Take 1 tablet (20 mg total) by mouth daily. 05/31/23   Lafe Domino, DO  ondansetron  (ZOFRAN -ODT) 4 MG disintegrating tablet Take 1 tablet (4 mg total) by mouth every 8 (eight) hours as needed. 11/21/23  Yes Trisha Morandi A, PA-C  oxyCODONE -acetaminophen  (PERCOCET/ROXICET) 5-325 MG tablet Take 1 tablet by mouth every 6 (six) hours as needed for up to 3 days for severe pain (pain score 7-10). 11/21/23 11/24/23 Yes Ahmadou Bolz A, PA-C  albuterol  (VENTOLIN  HFA) 108 (90 Base) MCG/ACT inhaler Inhale 2 puffs into the lungs every 6 (six) hours as needed for wheezing or shortness of breath. 05/06/22    Bryan Bianchi, MD  clonazePAM  (KLONOPIN ) 0.5 MG tablet Take 1 tablet (0.5 mg total) by mouth 2 (two) times daily as needed for anxiety. 11/10/22   Christia Budds, MD  cyclobenzaprine  (FLEXERIL ) 10 MG tablet Take 1 tablet (10 mg total) by mouth 2 (two) times daily as needed for muscle spasms. 01/26/23   Victor Lynwood DASEN, PA-C  ibuprofen  (ADVIL ) 800 MG tablet Take 1 tablet (800 mg total) by mouth 3 (three) times daily. 10/22/22   Mesner, Selinda, MD  lidocaine  (LIDODERM ) 5 % Place 1 patch onto the skin daily. Remove & Discard patch within 12 hours or as directed by MD 01/26/23   Victor Lynwood DASEN, PA-C  meclizine  (ANTIVERT ) 25 MG tablet Take 1 tablet (25 mg total) by mouth 3 (three) times daily as needed. 01/23/23   Clark, Meghan R, PA-C  ondansetron  (ZOFRAN ) 4 MG tablet Take 1 tablet (4 mg total) by mouth every 8 (eight) hours as needed for nausea or vomiting. 10/22/23   Bauer, Collin S, PA-C  senna-docusate (SENOKOT-S) 8.6-50 MG tablet Take 1 tablet by mouth daily. 09/25/22   Kingsley, Victoria K, DO  triamcinolone  cream (KENALOG ) 0.1 % APPLY TO AFFECTED AREA TWICE A DAY 08/27/22   Malvina Ellen, MD  Vitamin D , Ergocalciferol , (DRISDOL ) 1.25 MG (50000 UNIT) CAPS capsule TAKE 1 CAPSULE BY MOUTH EVERY 7 DAYS. WHEN YOU RUN OUT OF THESE 12 TABLETS, PLEASE RETURN TO CLINIC FOR VIT. D LEVEL RECHECK. 08/23/22   Malvina Ellen, MD  Allergies: Tomato, Augmentin  [amoxicillin -pot clavulanate], Mushroom extract complex (obsolete), and Reglan  [metoclopramide ]    Review of Systems  Constitutional: Negative.   HENT: Negative.  Negative for congestion.   Respiratory: Negative.    Cardiovascular: Negative.   Gastrointestinal: Negative.   Genitourinary:  Positive for dysuria and pelvic pain. Negative for decreased urine volume, difficulty urinating, flank pain, frequency, hematuria, menstrual problem, urgency, vaginal bleeding, vaginal discharge and vaginal pain.  Musculoskeletal: Negative.   Skin: Negative.    Neurological: Negative.   All other systems reviewed and are negative.   Updated Vital Signs BP (!) 128/90 (BP Location: Right Arm)   Pulse 77   Temp 98.1 F (36.7 C) (Oral)   Resp 18   LMP 10/15/2023   SpO2 100%   Physical Exam Vitals and nursing note reviewed.  Constitutional:      General: She is not in acute distress.    Appearance: She is well-developed. She is not ill-appearing, toxic-appearing or diaphoretic.  HENT:     Head: Normocephalic and atraumatic.  Eyes:     Pupils: Pupils are equal, round, and reactive to light.  Cardiovascular:     Rate and Rhythm: Normal rate.     Heart sounds: Normal heart sounds.  Pulmonary:     Effort: Pulmonary effort is normal. No respiratory distress.     Breath sounds: Normal breath sounds.  Abdominal:     General: Bowel sounds are normal. There is no distension.     Tenderness: There is abdominal tenderness in the suprapubic area. There is no right CVA tenderness or guarding. Negative signs include Murphy's sign and McBurney's sign.  Genitourinary:    Comments: declined Musculoskeletal:        General: Normal range of motion.     Cervical back: Normal range of motion.  Skin:    General: Skin is warm and dry.  Neurological:     General: No focal deficit present.     Mental Status: She is alert.  Psychiatric:        Mood and Affect: Mood normal.     (all labs ordered are listed, but only abnormal results are displayed) Labs Reviewed  CBC WITH DIFFERENTIAL/PLATELET - Abnormal; Notable for the following components:      Result Value   Hemoglobin 11.9 (*)    HCT 34.9 (*)    All other components within normal limits  COMPREHENSIVE METABOLIC PANEL WITH GFR - Abnormal; Notable for the following components:   CO2 21 (*)    Glucose, Bld 106 (*)    All other components within normal limits  URINALYSIS, W/ REFLEX TO CULTURE (INFECTION SUSPECTED) - Abnormal; Notable for the following components:   Specific Gravity, Urine 1.035  (*)    Hgb urine dipstick SMALL (*)    Protein, ur TRACE (*)    Leukocytes,Ua TRACE (*)    All other components within normal limits  LIPASE, BLOOD  PREGNANCY, URINE    EKG: None  Radiology: US  PELVIC TRANSABD W/PELVIC DOPPLER Result Date: 11/21/2023 EXAM: US  Pelvis, Complete Transabdominal with Doppler 11/21/2023 07:59:30 PM TECHNIQUE: Transabdominal elvic duplex ultrasound using B-mode/gray scaled imaging with Doppler spectral analysis and color flow was obtained. COMPARISON: None available CLINICAL HISTORY: Pelvic pain; LLQ pain. FINDINGS: UTERUS: Uterus measures 8.5 x 4.8 x 5.8 cm (calculated volume 124 ml). Uterus demonstrates normal myometrial echotexture. ENDOMETRIAL STRIPE: Endometrium measures 13 mm. Endometrial stripe is within normal limits. RIGHT OVARY: Right ovary is not discretely visualized due to overlying bowel gas. LEFT OVARY:  Left ovary measures 3.1 x 1.7 x 2.2 cm (calculated volume 5.9 ml). Left ovary is within normal limits. There is normal arterial and venous Doppler flow. FREE FLUID: No free fluid. IMPRESSION: 1. No acute findings. 2. No evidence of left adnexal torsion. 3. Right ovary not visualized due to overlying bowel gas. Electronically signed by: Pinkie Pebbles MD 11/21/2023 08:21 PM EDT RP Workstation: [id number]     Procedures   Medications Ordered in the ED  oxyCODONE -acetaminophen  (PERCOCET/ROXICET) 5-325 MG per tablet 1 tablet (1 tablet Oral Given 11/21/23 1846)  ondansetron  (ZOFRAN -ODT) disintegrating tablet 4 mg (4 mg Oral Given 11/21/23 1846)  HYDROcodone -acetaminophen  (NORCO/VICODIN) 5-325 MG per tablet 1 tablet (1 tablet Oral Given 11/21/23 279)   33 year old chronic pelvic pain due to endometriosis here for evaluation of pelvic pain over the last 5 days.  Recently completed her menstrual cycle.  Mild dysuria without frequency, urgency.  She is post follow-up with pain management in 2 days however ran out of her Percocet prescription from her recent ED  visit and has had continued pain.  She failed Butrans patch as she did not tolerate the sedation.  States she typically does well with Percocet.  Pain feels like her typical endometriosis pain.  States she has not had imaging in quite some time.  Does have history of cyst and fibroids.  She is afebrile, nonseptic, non-ill-appearing.  She is tender to her suprapubic region.  No concern for STI.  Will plan on labs.  Shared decision making for imaging.  Will get ultrasound here.  Labs and imaging personally viewed and interpreted:  CBC without leukocytosis, hemoglobin 11.9 UA negative for infection Pregnancy test negative CMP glucose 106 Lipase lipase 22 US  pelvic negative for torsion, nonvisualization of the right ovary due to bowel gas  Discussed results with patient and family.  I suspect acute on chronic pain.  She is appointment pain management in 2 days.  Did write for short course of pain medication to get her over until then.  We discussed risk versus benefit of continued opioid prescriptions.  She will follow-up outpatient, return for any worsening symptoms.  Patient does not meet the SIRS or Sepsis criteria.  On repeat exam patient does not have a surgical abdomin and there are no peritoneal signs.  No indication of appendicitis, bowel obstruction, bowel perforation, cholecystitis, diverticulitis, PID, intermittent, persistent torsion, TOA, ectopic pregnancy, AAA, dissection, traumatic injury.  Patient discharged home with symptomatic treatment and given strict instructions for follow-up with their primary care physician.  I have also discussed reasons to return immediately to the ER.  Patient expresses understanding and agrees with plan.                                    Medical Decision Making Amount and/or Complexity of Data Reviewed External Data Reviewed: labs, radiology and notes. Labs: ordered. Decision-making details documented in ED Course. Radiology: ordered and independent  interpretation performed. Decision-making details documented in ED Course.  Risk OTC drugs. Prescription drug management. Decision regarding hospitalization. Diagnosis or treatment significantly limited by social determinants of health.       Final diagnoses:  Pelvic pain    ED Discharge Orders          Ordered    oxyCODONE -acetaminophen  (PERCOCET/ROXICET) 5-325 MG tablet  Every 6 hours PRN        11/21/23 2037    ondansetron  (ZOFRAN -ODT) 4 MG disintegrating  tablet  Every 8 hours PRN        11/21/23 2037               Shaquia Berkley A, PA-C 11/21/23 2038    Ula Prentice SAUNDERS, MD 11/21/23 [phone]

## 2023-11-21 NOTE — ED Triage Notes (Signed)
 C/o lower abd pain x 5 days, Seen here on Friday for same and has not had any improvement, +n/v.   Was given Toradol  at [facility] today.

## 2023-11-23 ENCOUNTER — Other Ambulatory Visit: Payer: Self-pay | Admitting: Family Medicine

## 2023-11-23 DIAGNOSIS — F411 Generalized anxiety disorder: Secondary | ICD-10-CM

## 2023-11-24 ENCOUNTER — Other Ambulatory Visit: Payer: Self-pay

## 2023-11-24 ENCOUNTER — Emergency Department (HOSPITAL_BASED_OUTPATIENT_CLINIC_OR_DEPARTMENT_OTHER)
Admission: EM | Admit: 2023-11-24 | Discharge: 2023-11-24 | Disposition: A | Discharge status: Home / Self Care | Attending: Emergency Medicine | Admitting: Emergency Medicine

## 2023-11-24 DIAGNOSIS — R102 Pelvic and perineal pain: Secondary | ICD-10-CM | POA: Insufficient documentation

## 2023-11-24 DIAGNOSIS — J45909 Unspecified asthma, uncomplicated: Secondary | ICD-10-CM | POA: Diagnosis not present

## 2023-11-24 DIAGNOSIS — G8929 Other chronic pain: Secondary | ICD-10-CM | POA: Insufficient documentation

## 2023-11-24 LAB — URINALYSIS, ROUTINE W REFLEX MICROSCOPIC
Bacteria, UA: NONE SEEN
Bilirubin Urine: NEGATIVE
Glucose, UA: NEGATIVE mg/dL
Hgb urine dipstick: NEGATIVE
Ketones, ur: NEGATIVE mg/dL
Leukocytes,Ua: NEGATIVE
Nitrite: NEGATIVE
Protein, ur: 30 mg/dL — AB
Specific Gravity, Urine: 1.036 — ABNORMAL HIGH (ref 1.005–1.030)
pH: 6 (ref 5.0–8.0)

## 2023-11-24 MED ORDER — ONDANSETRON 4 MG PO TBDP
4.0000 mg | ORAL_TABLET | Freq: Once | ORAL | Status: AC
  Administered 2023-11-24: 4 mg via ORAL
  Filled 2023-11-24: qty 1

## 2023-11-24 MED ORDER — ONDANSETRON HCL 4 MG PO TABS: 4.0000 mg | ORAL_TABLET | Freq: Three times a day (TID) | ORAL | Status: DC | PRN

## 2023-11-24 MED ORDER — KETOROLAC TROMETHAMINE 15 MG/ML IJ SOLN
15.0000 mg | Freq: Once | INTRAMUSCULAR | Status: AC
  Administered 2023-11-24: 15 mg via INTRAMUSCULAR
  Filled 2023-11-24: qty 1

## 2023-11-24 MED ORDER — OXYCODONE-ACETAMINOPHEN 5-325 MG PO TABS: 1.0000 | ORAL_TABLET | Freq: Four times a day (QID) | ORAL | 0 refills | Status: DC | PRN

## 2023-11-24 NOTE — ED Provider Notes (Signed)
 [place] EMERGENCY DEPARTMENT AT [facility] Provider Note   CSN: [id number] Arrival date & time: 11/24/23  2105     Patient presents with: Pelvic Pain   Wanda Nixon is a 33 y.o. female.   Pelvic Pain  Patient is a 33 year old female to the ED today for concerns for acute on chronic pelvic pain.  She seen multiple times for similar.  Currently in the works for a hysterectomy scheduled for October and is meeting with pain management to establish pain routine.  Reports that pain management was to place her on too strong of narcotics and/or too expensive medication, and does not have a meeting with them until next week.  Contacted her OB/GYN to have her oxycodone  refilled today but was told to come to the ED because she contacted them after hours.    Reports only having 2 oxycodone  tablets left at this point and did not want to use them despite them helping relieve her pain because she did not want to be left without.  Denies any new or worsening symptoms.  No changes from last time she was seen in the emergency department.  Prior to Admission medications   Medication Sig Start Date End Date Taking? Authorizing Provider  oxyCODONE -acetaminophen  (PERCOCET/ROXICET) 5-325 MG tablet Take 1 tablet by mouth every 6 (six) hours as needed for severe pain (pain score 7-10). 11/24/23  Yes Beola Terrall RAMAN, PA-C  albuterol  (VENTOLIN  HFA) 108 (90 Base) MCG/ACT inhaler Inhale 2 puffs into the lungs every 6 (six) hours as needed for wheezing or shortness of breath. 05/06/22   Bryan Bianchi, MD  citalopram  (CELEXA ) 20 MG tablet Take 1 tablet (20 mg total) by mouth daily. NEED PCP FOLLOW UP. 11/24/23   Lafe Domino, DO  clonazePAM  (KLONOPIN ) 0.5 MG tablet Take 1 tablet (0.5 mg total) by mouth 2 (two) times daily as needed for anxiety. 11/10/22   Christia Budds, MD  cyclobenzaprine  (FLEXERIL ) 10 MG tablet Take 1 tablet (10 mg total) by mouth 2 (two) times daily as needed for muscle spasms.  01/26/23   Victor Lynwood DASEN, PA-C  ibuprofen  (ADVIL ) 800 MG tablet Take 1 tablet (800 mg total) by mouth 3 (three) times daily. 10/22/22   Mesner, Selinda, MD  lidocaine  (LIDODERM ) 5 % Place 1 patch onto the skin daily. Remove & Discard patch within 12 hours or as directed by MD 01/26/23   Victor Lynwood DASEN, PA-C  meclizine  (ANTIVERT ) 25 MG tablet Take 1 tablet (25 mg total) by mouth 3 (three) times daily as needed. 01/23/23   Clark, Meghan R, PA-C  ondansetron  (ZOFRAN ) 4 MG tablet Take 1 tablet (4 mg total) by mouth every 8 (eight) hours as needed for nausea or vomiting. 11/24/23   Beola Terrall RAMAN, PA-C  ondansetron  (ZOFRAN -ODT) 4 MG disintegrating tablet Take 1 tablet (4 mg total) by mouth every 8 (eight) hours as needed. 11/21/23   Henderly, Britni A, PA-C  oxyCODONE -acetaminophen  (PERCOCET/ROXICET) 5-325 MG tablet Take 1 tablet by mouth every 6 (six) hours as needed for up to 3 days for severe pain (pain score 7-10). 11/21/23 11/24/23  Henderly, Britni A, PA-C  senna-docusate (SENOKOT-S) 8.6-50 MG tablet Take 1 tablet by mouth daily. 09/25/22   Kingsley, Victoria K, DO  triamcinolone  cream (KENALOG ) 0.1 % APPLY TO AFFECTED AREA TWICE A DAY 08/27/22   Malvina Ellen, MD  Vitamin D , Ergocalciferol , (DRISDOL ) 1.25 MG (50000 UNIT) CAPS capsule TAKE 1 CAPSULE BY MOUTH EVERY 7 DAYS. WHEN YOU RUN OUT OF THESE 12  TABLETS, PLEASE RETURN TO CLINIC FOR VIT. D LEVEL RECHECK. 08/23/22   Malvina Ellen, MD    Allergies: Tomato, Augmentin  [amoxicillin -pot clavulanate], Mushroom extract complex (obsolete), and Reglan  [metoclopramide ]    Review of Systems  Genitourinary:  Positive for pelvic pain.  All other systems reviewed and are negative.   Updated Vital Signs BP (!) 135/98 (BP Location: Right Arm)   Pulse 80   Temp (!) 97 F (36.1 C)   Resp 16   LMP 11/16/2023 (Exact Date)   SpO2 100%   Physical Exam Vitals and nursing note reviewed.  Constitutional:      General: She is not in acute distress.     Appearance: Normal appearance. She is not ill-appearing.  HENT:     Head: Normocephalic and atraumatic.  Eyes:     General: No scleral icterus.       Right eye: No discharge.        Left eye: No discharge.     Extraocular Movements: Extraocular movements intact.     Conjunctiva/sclera: Conjunctivae normal.  Cardiovascular:     Rate and Rhythm: Normal rate and regular rhythm.     Pulses: Normal pulses.     Heart sounds: Normal heart sounds. No murmur heard.    No friction rub. No gallop.  Pulmonary:     Effort: Pulmonary effort is normal. No respiratory distress.     Breath sounds: Normal breath sounds. No stridor. No wheezing, rhonchi or rales.  Chest:     Chest wall: No tenderness.  Abdominal:     General: Abdomen is flat. There is no distension.     Palpations: Abdomen is soft.     Tenderness: There is abdominal tenderness (Lower abdominal tenderness noted to palpation). There is no guarding or rebound.  Musculoskeletal:     Cervical back: Normal range of motion and neck supple. No rigidity or tenderness.     Right lower leg: No edema.     Left lower leg: No edema.  Skin:    General: Skin is warm and dry.     Findings: No bruising, erythema or lesion.  Neurological:     General: No focal deficit present.     Mental Status: She is alert and oriented to person, place, and time. Mental status is at baseline.     Sensory: No sensory deficit.     Motor: No weakness.     Gait: Gait normal.  Psychiatric:        Mood and Affect: Mood normal.     (all labs ordered are listed, but only abnormal results are displayed) Labs Reviewed  URINALYSIS, ROUTINE W REFLEX MICROSCOPIC - Abnormal; Notable for the following components:      Result Value   Specific Gravity, Urine 1.036 (*)    Protein, ur 30 (*)    All other components within normal limits    EKG: None  Radiology: No results found.  Procedures   Medications Ordered in the ED  ketorolac  (TORADOL ) 15 MG/ML injection  15 mg (15 mg Intramuscular Given 11/24/23 2302)  ondansetron  (ZOFRAN -ODT) disintegrating tablet 4 mg (4 mg Oral Given 11/24/23 2302)    Medical Decision Making Amount and/or Complexity of Data Reviewed Labs: ordered.  Risk Prescription drug management.  This patient is a 33 year old female who presents to the ED for concern of acute on chronic pelvic pain, seen previously 3 times in the last 2 weeks for same, no new changes in her symptoms, noting that she only has had  2 pills left of her oxycodone  which have managed her pain well but came today because she did not want to be left without over the weekend.  She contacted her OB/GYN who said that she could refill it if she was in clinic but since it was after hours that she could not and will need to go to the ER.  Patient showed me messages between her and her OB/GYN and PCP.  Notes that she is also being followed by pain management who wished to put her on a bunch of medications I cannot afford as well as too strong of narcotics, I have kids in need to be alert.  States that her 5 mg oxycodones at home have been helpful with the best pain medication that helps her so far is Percocet.  On physical exam, patient is in no acute distress, afebrile, alert and orient x 4, speaking in full sentences, nontachypneic, nontachycardic.  Notably has chronic abdominal tenderness with no peritoneal signs.  Unremarkable exam otherwise.  With patient's story and showing her messages from her various doctors and being scheduled for a hysterectomy for her chronic endometriosis causing the chronic pelvic pain, alerted her that I will be able to prescribe her a very short dose of Percocet, noting that she has an appointment on Monday with her primary care as well as OB/GYN.  Told her that this is the last dose that she would be getting from the ER and will need to find either a new OB/GYN and/or contact the one that she currently is seeing for further pain management  and contact the pain management clinic.  She was agreeable to this plan.  Will provide Toradol  and Zofran  here due to her experiencing pain and wishing to leave after being given the Toradol , having kids with her at this time.  Patient vital signs have remained stable throughout the course of patient's time in the ED. Low suspicion for any other emergent pathology at this time. I believe this patient is safe to be discharged. Provided strict return to ER precautions. Patient expressed agreement and understanding of plan. All questions were answered.  Differential diagnoses prior to evaluation: The emergent differential diagnosis includes, but is not limited to, appendicitis, Diverticulitis, Gastritis, Enteritis, Incarcerated or Strangulated Hernia, Nephrolithiasis , Pyelonephritis, Inflammatory Bowel Disease, Mesenteric Adenitis, Gastroenteritis, Constipation,   Endometriosis, Ectopic Pregnancy, Ovarian Torsion, Ruptured Ovarian Cyst, Pelvic Inflammatory Disease (PID), Tubo-Ovarian Abscess, Mittelschmerz. This is not an exhaustive differential.   Past Medical History / Co-morbidities / Social History: Chronic pelvic pain, asthma, migraine, endometriosis  Additional history: Chart reviewed. Pertinent results include:   Noted to have 14 ED visits in the last 6 months with 3 visits noted within the last 2 weeks.  All of which had had narcotic medications prescribed for her.  Lab Tests/Imaging studies: I personally interpreted labs/imaging and the pertinent results include:   UA shows elevated specific gravity and proteins but otherwise unremarkable   Medications: I ordered medication including Toradol , Zofran .  I have reviewed the patients home medicines and have made adjustments as needed.  Critical Interventions: None  Social Determinants of Health: Notably scheduled for a hysterectomy in October and November but is having trouble getting scheduled with her GYN for a sooner appointment and  is having a hard time managing pain with both pain management and OB/GYN  Disposition: After consideration of the diagnostic results and the patients response to treatment, I feel that the patient would benefit from discharge home as above.  emergency department workup does not suggest an emergent condition requiring admission or immediate intervention beyond what has been performed at this time. The plan is: Will provide 1 last refill for her narcotic medication, alerted her that this will be the last medication that she will be able prescribed through the ER and will need to be able to follow-up with her OB/GYN as well as pain management for further pain medications.. The patient is safe for discharge and has been instructed to return immediately for worsening symptoms, change in symptoms or any other concerns.     Final diagnoses:  Chronic pelvic pain in female    ED Discharge Orders          Ordered    oxyCODONE -acetaminophen  (PERCOCET/ROXICET) 5-325 MG tablet  Every 6 hours PRN        11/24/23 2255    ondansetron  (ZOFRAN ) 4 MG tablet  Every 8 hours PRN        11/24/23 2256               Beola Terrall RAMAN, PA-C 11/24/23 2336    Long, Joshua G, MD 12/04/23 [phone]

## 2023-11-24 NOTE — ED Triage Notes (Signed)
 Pt here for continued pelvic pain. Pt seen here earlier this week for same. Pt states that she has endometriosis and and is scheduled to see pain specialist on Monday.

## 2023-11-24 NOTE — Discharge Instructions (Addendum)
 You are seen today for chronic pelvic pain.  With you being scheduled by surgery and now doing with chronic pain, recommend you continue to follow-up with OB/GYN and discuss further pain management at this time.  As we will not be able to prescribe pain medications from the ER for this chronic pain in the future.  Return to the ED though if you begin to have any new or worsening pain.

## 2023-11-24 NOTE — ED Notes (Signed)
 DC instructions given. Pt verbally understands, pt out of ED with steady gait with all belongings and steady gait. Rx sent to pt preferred pharmacy.

## 2023-12-08 ENCOUNTER — Ambulatory Visit: Admitting: Family Medicine

## 2023-12-12 ENCOUNTER — Encounter (HOSPITAL_BASED_OUTPATIENT_CLINIC_OR_DEPARTMENT_OTHER): Payer: Self-pay

## 2023-12-12 ENCOUNTER — Emergency Department (HOSPITAL_BASED_OUTPATIENT_CLINIC_OR_DEPARTMENT_OTHER): Admission: EM | Admit: 2023-12-12 | Discharge: 2023-12-12 | Disposition: A | Discharge status: Home / Self Care

## 2023-12-12 ENCOUNTER — Other Ambulatory Visit: Payer: Self-pay

## 2023-12-12 DIAGNOSIS — N939 Abnormal uterine and vaginal bleeding, unspecified: Secondary | ICD-10-CM | POA: Insufficient documentation

## 2023-12-12 DIAGNOSIS — R1030 Lower abdominal pain, unspecified: Secondary | ICD-10-CM

## 2023-12-12 DIAGNOSIS — R102 Pelvic and perineal pain: Secondary | ICD-10-CM | POA: Insufficient documentation

## 2023-12-12 LAB — CBC
HCT: 39 % (ref 36.0–46.0)
Hemoglobin: 13.4 g/dL (ref 12.0–15.0)
MCH: 29.1 pg (ref 26.0–34.0)
MCHC: 34.4 g/dL (ref 30.0–36.0)
MCV: 84.8 fL (ref 80.0–100.0)
Platelets: 326 K/uL (ref 150–400)
RBC: 4.6 MIL/uL (ref 3.87–5.11)
RDW: 12.9 % (ref 11.5–15.5)
WBC: 5.1 K/uL (ref 4.0–10.5)
nRBC: 0 % (ref 0.0–0.2)

## 2023-12-12 LAB — PREGNANCY, URINE: Preg Test, Ur: NEGATIVE

## 2023-12-12 MED ORDER — ONDANSETRON HCL 4 MG PO TABS: 4.0000 mg | ORAL_TABLET | Freq: Three times a day (TID) | ORAL | Status: DC | PRN

## 2023-12-12 MED ORDER — OXYCODONE HCL 5 MG PO TABS
5.0000 mg | ORAL_TABLET | Freq: Once | ORAL | Status: AC
  Administered 2023-12-12 (×2): 5 mg via ORAL
  Filled 2023-12-12: qty 1

## 2023-12-12 MED ORDER — KETOROLAC TROMETHAMINE 15 MG/ML IJ SOLN
15.0000 mg | Freq: Once | INTRAMUSCULAR | Status: AC
  Administered 2023-12-12 (×2): 15 mg via INTRAMUSCULAR
  Filled 2023-12-12: qty 1

## 2023-12-12 MED ORDER — OXYCODONE HCL 5 MG PO TABS: 5.0000 mg | ORAL_TABLET | ORAL | 0 refills | Status: DC | PRN

## 2023-12-12 MED ORDER — ONDANSETRON HCL 4 MG PO TABS: 4.0000 mg | ORAL_TABLET | Freq: Three times a day (TID) | ORAL | 0 refills | Status: DC | PRN

## 2023-12-12 NOTE — Discharge Instructions (Addendum)
 For pain, you can take 1000 mg of Tylenol  or 1 g of Tylenol  every 6-8 hours.  Do not exceed more than 4000 mg or 4 g in a 24-hour period.  You can also take ibuprofen  600 to 800 mg every 6-8 hours as well.  Do not take this high-dose ibuprofen  for greater than a week.  Please only take the oxycodone  for breakthrough pain.   Please follow-up with your OB/GYN.   If you have any worsening symptoms or changes in symptoms then please come at the ED for further evaluation.

## 2023-12-12 NOTE — ED Provider Notes (Signed)
 [place] EMERGENCY DEPARTMENT AT [facility] Provider Note   CSN: [id number] Arrival date & time: 12/12/23  1335     Patient presents with: Pelvic Pain, Back Pain, and Vaginal Bleeding   Wanda Nixon is a 33 y.o. female.    Pelvic Pain  Back Pain Associated symptoms: pelvic pain   Vaginal Bleeding Associated symptoms: back pain     Patient presents because of pelvic pain and cramping.  Patient states that she has a long history of endometriosis.  Started menstrual cycle earlier this morning.  Around then, started with worsening pelvic cramping which is very consistent for previous episodes of endometrial flares.  States that she scheduled for hysterectomy either October or November.  Following up with OB on August 20.  States she is usually able to manage her pain without pain medication whenever she is not on her cycle but whenever she starts her cycle she starts with significant pain.  Patient states that even hurts to walk at this moment in time.  Endorses some nausea which is typical for her previous episodes.  No vomiting.  Some diarrhea as well which she states is also consistent with her previous episodes.  No blood in her stools.  No abnormal vaginal discharge.  Took ibuprofen  early this morning but otherwise has taken any meds.  No dysuria.   Previous medical history reviewed : Patient was last seen in the ED on November 24, 2023.  He was given a very short dose of Percocet.  Last pelvic ultrasound completed on July 22.  No acute findings were seen.      Prior to Admission medications   Medication Sig Start Date End Date Taking? Authorizing Provider  ondansetron  (ZOFRAN ) 4 MG tablet Take 1 tablet (4 mg total) by mouth every 8 (eight) hours as needed for up to 12 doses for nausea or vomiting. 12/12/23  Yes Simon Lavonia SAILOR, MD  oxyCODONE  (ROXICODONE ) 5 MG immediate release tablet Take 1 tablet (5 mg total) by mouth every 4 (four) hours as needed for severe pain (pain  score 7-10). 12/12/23  Yes Simon Lavonia SAILOR, MD  albuterol  (VENTOLIN  HFA) 108 (90 Base) MCG/ACT inhaler Inhale 2 puffs into the lungs every 6 (six) hours as needed for wheezing or shortness of breath. 05/06/22   Bryan Bianchi, MD  citalopram  (CELEXA ) 20 MG tablet Take 1 tablet (20 mg total) by mouth daily. NEED PCP FOLLOW UP. 11/24/23   Lafe Domino, DO  clonazePAM  (KLONOPIN ) 0.5 MG tablet Take 1 tablet (0.5 mg total) by mouth 2 (two) times daily as needed for anxiety. 11/10/22   Christia Budds, MD  cyclobenzaprine  (FLEXERIL ) 10 MG tablet Take 1 tablet (10 mg total) by mouth 2 (two) times daily as needed for muscle spasms. 01/26/23   Victor Lynwood DASEN, PA-C  ibuprofen  (ADVIL ) 800 MG tablet Take 1 tablet (800 mg total) by mouth 3 (three) times daily. 10/22/22   Mesner, Selinda, MD  lidocaine  (LIDODERM ) 5 % Place 1 patch onto the skin daily. Remove & Discard patch within 12 hours or as directed by MD 01/26/23   Victor Lynwood DASEN, PA-C  meclizine  (ANTIVERT ) 25 MG tablet Take 1 tablet (25 mg total) by mouth 3 (three) times daily as needed. 01/23/23   Clark, Meghan R, PA-C  ondansetron  (ZOFRAN ) 4 MG tablet Take 1 tablet (4 mg total) by mouth every 8 (eight) hours as needed for nausea or vomiting. 11/24/23   Beola Terrall RAMAN, PA-C  ondansetron  (ZOFRAN -ODT) 4 MG disintegrating tablet  Take 1 tablet (4 mg total) by mouth every 8 (eight) hours as needed. 11/21/23   Henderly, Britni A, PA-C  oxyCODONE -acetaminophen  (PERCOCET/ROXICET) 5-325 MG tablet Take 1 tablet by mouth every 6 (six) hours as needed for severe pain (pain score 7-10). 11/24/23   Bauer, Collin S, PA-C  senna-docusate (SENOKOT-S) 8.6-50 MG tablet Take 1 tablet by mouth daily. 09/25/22   Kingsley, Victoria K, DO  triamcinolone  cream (KENALOG ) 0.1 % APPLY TO AFFECTED AREA TWICE A DAY 08/27/22   Malvina Ellen, MD  Vitamin D , Ergocalciferol , (DRISDOL ) 1.25 MG (50000 UNIT) CAPS capsule TAKE 1 CAPSULE BY MOUTH EVERY 7 DAYS. WHEN YOU RUN OUT OF THESE 12 TABLETS, PLEASE  RETURN TO CLINIC FOR VIT. D LEVEL RECHECK. 08/23/22   Malvina Ellen, MD    Allergies: Tomato, Augmentin  [amoxicillin -pot clavulanate], Mushroom extract complex (obsolete), and Reglan  [metoclopramide ]    Review of Systems  Genitourinary:  Positive for pelvic pain and vaginal bleeding.  Musculoskeletal:  Positive for back pain.    Updated Vital Signs BP (!) 139/94   Pulse 83   Temp 98.6 F (37 C) (Oral)   Resp 16   LMP 11/16/2023 (Exact Date)   SpO2 100%   Physical Exam Vitals and nursing note reviewed.  Constitutional:      General: She is not in acute distress.    Appearance: She is well-developed.  HENT:     Head: Normocephalic and atraumatic.  Eyes:     Conjunctiva/sclera: Conjunctivae normal.  Cardiovascular:     Rate and Rhythm: Normal rate and regular rhythm.     Heart sounds: No murmur heard. Pulmonary:     Effort: Pulmonary effort is normal. No respiratory distress.     Breath sounds: Normal breath sounds.  Abdominal:     Palpations: Abdomen is soft.     Tenderness: There is no abdominal tenderness.  Musculoskeletal:        General: No swelling.     Cervical back: Neck supple.  Skin:    General: Skin is warm and dry.     Capillary Refill: Capillary refill takes less than 2 seconds.  Neurological:     Mental Status: She is alert.  Psychiatric:        Mood and Affect: Mood normal.     (all labs ordered are listed, but only abnormal results are displayed) Labs Reviewed  PREGNANCY, URINE  CBC    EKG: None  Radiology: No results found.   Procedures   Medications Ordered in the ED  ketorolac  (TORADOL ) 15 MG/ML injection 15 mg (15 mg Intramuscular Given 12/12/23 1625)  oxyCODONE  (Oxy IR/ROXICODONE ) immediate release tablet 5 mg (5 mg Oral Given 12/12/23 1624)                                    Medical Decision Making Amount and/or Complexity of Data Reviewed Labs: ordered.  Risk Prescription drug management.     Patient presents  because of pelvic pain and cramping.  Patient states that she has a long history of endometriosis.  Started menstrual cycle earlier this morning.  Around then, started with worsening pelvic cramping which is very consistent for previous episodes of endometrial flares.  States that she scheduled for hysterectomy either October or November.  Following up with OB on August 20.  States she is usually able to manage her pain without pain medication whenever she is not on her cycle but whenever  she starts her cycle she starts with significant pain.  Patient states that even hurts to walk at this moment in time.  Endorses some nausea which is typical for her previous episodes.  No vomiting.  Some diarrhea as well which she states is also consistent with her previous episodes.  No blood in her stools.  No abnormal vaginal discharge.  Took ibuprofen  early this morning but otherwise has taken any meds.  No dysuria.  Previous medical history reviewed : Patient was last seen in the ED on November 24, 2023.  He was given a very short dose of Percocet.Last pelvic ultrasound completed on July 22.  No acute findings were seen.  Upon exam, patient no acute distress.  Hemodynamically stable.  Patient has some tenderness in the lower abdomen.  No rebound guarding that could appreciate. No concerns for  Appendicitis.  No concerns for any kind of diverticulitis or other intra-abdominal process at this time.  Reviewed patient's prior ED visits.  No indication for imaging at this point time.  No concerns, ovarian torsion.  This all seems very consistent for endometriosis given her starting her menstrual cycle.  Has any obvious other abnormal vaginal discharge.  No concerns for PID or pelvic abscess.  No urinary symptoms.  No dysuria.  No concerns for UTI.   Gave patient IM Toradol  as well as 1 small dose oxycodone  5 mg.  On the pain subsequently resolved.  Benign abdomen.  No leukocytosis on labs.  Hemoglobin stable.  No concerns  for any kind of excessive blood loss.  Patient has follow-up with OB later this month.  Recommended oral pain control at home.  Discharged in stable condition.       Final diagnoses:  Lower abdominal pain    ED Discharge Orders          Ordered    oxyCODONE  (ROXICODONE ) 5 MG immediate release tablet  Every 4 hours PRN        12/12/23 1727    ondansetron  (ZOFRAN ) 4 MG tablet  Every 8 hours PRN        12/12/23 1728               Simon Lavonia SAILOR, MD 12/12/23 1730

## 2023-12-12 NOTE — ED Triage Notes (Signed)
 Patient reports starting her cycle and has a hx of endometriosis. She has surgery scheduled for November for hysterectomy. She also reports an appointment with her OB later this month but when she called she was told to come here. Reports cramping and going through two pads this morning. She notices lots of clotting. Patient also says this pain is radiating to her back.

## 2023-12-18 ENCOUNTER — Other Ambulatory Visit: Payer: Self-pay | Admitting: Family Medicine

## 2023-12-18 DIAGNOSIS — F411 Generalized anxiety disorder: Secondary | ICD-10-CM

## 2023-12-28 ENCOUNTER — Emergency Department (HOSPITAL_BASED_OUTPATIENT_CLINIC_OR_DEPARTMENT_OTHER)
Admission: EM | Admit: 2023-12-28 | Discharge: 2023-12-28 | Disposition: A | Discharge status: Home / Self Care | Attending: Emergency Medicine | Admitting: Emergency Medicine

## 2023-12-28 ENCOUNTER — Encounter (HOSPITAL_BASED_OUTPATIENT_CLINIC_OR_DEPARTMENT_OTHER): Payer: Self-pay | Admitting: Emergency Medicine

## 2023-12-28 ENCOUNTER — Other Ambulatory Visit: Payer: Self-pay

## 2023-12-28 ENCOUNTER — Other Ambulatory Visit (HOSPITAL_BASED_OUTPATIENT_CLINIC_OR_DEPARTMENT_OTHER): Payer: Self-pay

## 2023-12-28 DIAGNOSIS — R197 Diarrhea, unspecified: Secondary | ICD-10-CM | POA: Insufficient documentation

## 2023-12-28 DIAGNOSIS — R1084 Generalized abdominal pain: Secondary | ICD-10-CM | POA: Diagnosis not present

## 2023-12-28 DIAGNOSIS — R112 Nausea with vomiting, unspecified: Secondary | ICD-10-CM | POA: Diagnosis not present

## 2023-12-28 LAB — COMPREHENSIVE METABOLIC PANEL WITH GFR
ALT: 6 U/L (ref 0–44)
AST: 14 U/L — ABNORMAL LOW (ref 15–41)
Albumin: 4.7 g/dL (ref 3.5–5.0)
Alkaline Phosphatase: 49 U/L (ref 38–126)
Anion gap: 13 (ref 5–15)
BUN: 12 mg/dL (ref 6–20)
CO2: 22 mmol/L (ref 22–32)
Calcium: 9.7 mg/dL (ref 8.9–10.3)
Chloride: 105 mmol/L (ref 98–111)
Creatinine, Ser: 0.78 mg/dL (ref 0.44–1.00)
GFR, Estimated: >60 mL/min (ref >60)
Glucose, Bld: 88 mg/dL (ref 70–99)
Potassium: 3.9 mmol/L (ref 3.5–5.1)
Sodium: 140 mmol/L (ref 135–145)
Total Bilirubin: 0.7 mg/dL (ref 0.0–1.2)
Total Protein: 7.9 g/dL (ref 6.5–8.1)

## 2023-12-28 LAB — CBC WITH DIFFERENTIAL/PLATELET
Abs Immature Granulocytes: 0.01 K/uL (ref 0.00–0.07)
Basophils Absolute: 0 K/uL (ref 0.0–0.1)
Basophils Relative: 0 %
Eosinophils Absolute: 0.1 K/uL (ref 0.0–0.5)
Eosinophils Relative: 2 %
HCT: 36.7 % (ref 36.0–46.0)
Hemoglobin: 12.7 g/dL (ref 12.0–15.0)
Immature Granulocytes: 0 %
Lymphocytes Relative: 24 %
Lymphs Abs: 0.8 K/uL (ref 0.7–4.0)
MCH: 29 pg (ref 26.0–34.0)
MCHC: 34.6 g/dL (ref 30.0–36.0)
MCV: 83.8 fL (ref 80.0–100.0)
Monocytes Absolute: 0.2 K/uL (ref 0.1–1.0)
Monocytes Relative: 7 %
Neutro Abs: 2.4 K/uL (ref 1.7–7.7)
Neutrophils Relative %: 67 %
Platelets: 288 K/uL (ref 150–400)
RBC: 4.38 MIL/uL (ref 3.87–5.11)
RDW: 12.4 % (ref 11.5–15.5)
WBC: 3.5 K/uL — ABNORMAL LOW (ref 4.0–10.5)
nRBC: 0 % (ref 0.0–0.2)

## 2023-12-28 LAB — URINALYSIS, ROUTINE W REFLEX MICROSCOPIC
Bilirubin Urine: NEGATIVE
Glucose, UA: NEGATIVE mg/dL
Hgb urine dipstick: NEGATIVE
Ketones, ur: NEGATIVE mg/dL
Nitrite: NEGATIVE
Specific Gravity, Urine: 1.027 (ref 1.005–1.030)
pH: 6.5 (ref 5.0–8.0)

## 2023-12-28 LAB — PREGNANCY, URINE: Preg Test, Ur: NEGATIVE

## 2023-12-28 LAB — LIPASE, BLOOD: Lipase: 19 U/L (ref 11–51)

## 2023-12-28 MED ORDER — ONDANSETRON 4 MG PO TBDP
4.0000 mg | ORAL_TABLET | Freq: Three times a day (TID) | ORAL | 0 refills | Status: AC | PRN
  Filled 2023-12-28: qty 20, 7d supply, fill #0

## 2023-12-28 MED ORDER — OXYCODONE-ACETAMINOPHEN 5-325 MG PO TABS
1.0000 | ORAL_TABLET | Freq: Once | ORAL | Status: AC
  Administered 2023-12-28: 1 via ORAL
  Filled 2023-12-28: qty 1

## 2023-12-28 MED ORDER — SODIUM CHLORIDE 0.9 % IV BOLUS
1000.0000 mL | Freq: Once | INTRAVENOUS | Status: AC
  Administered 2023-12-28: 1000 mL via INTRAVENOUS

## 2023-12-28 MED ORDER — HYDROMORPHONE HCL 1 MG/ML IJ SOLN
1.0000 mg | Freq: Once | INTRAMUSCULAR | Status: DC
  Filled 2023-12-28: qty 1

## 2023-12-28 MED ORDER — ONDANSETRON HCL 4 MG/2ML IJ SOLN
4.0000 mg | Freq: Once | INTRAMUSCULAR | Status: AC
  Administered 2023-12-28: 4 mg via INTRAVENOUS
  Filled 2023-12-28: qty 2

## 2023-12-28 NOTE — ED Provider Notes (Signed)
 [place] EMERGENCY DEPARTMENT AT [facility] Provider Note   CSN: [id number] Arrival date & time: 12/28/23  9263     Patient presents with: Abdominal Pain   Wanda Nixon is a 33 y.o. female.   Patient here with nausea vomiting diarrhea.  Generalized abdominal pain but fairly chronic due to chronic endometriosis.  Specific hysterectomy seen.  But nausea vomiting diarrhea started yesterday.  No suspicious food intake.  Nothing makes it worse or better.  Denies any fever chills vaginal bleeding urinary symptoms.  No other major medical problems.  She has had fallopian tube removal in the past but no other intra-abdominal surgery.  The history is provided by the patient.       Prior to Admission medications   Medication Sig Start Date End Date Taking? Authorizing Provider  ondansetron  (ZOFRAN -ODT) 4 MG disintegrating tablet Take 1 tablet (4 mg total) by mouth every 8 (eight) hours as needed. 12/28/23  Yes Mariska Daffin, DO  albuterol  (VENTOLIN  HFA) 108 (90 Base) MCG/ACT inhaler Inhale 2 puffs into the lungs every 6 (six) hours as needed for wheezing or shortness of breath. 05/06/22   Bryan Bianchi, MD  citalopram  (CELEXA ) 20 MG tablet TAKE 1 TABLET (20 MG TOTAL) BY MOUTH DAILY. NEED PCP FOLLOW UP. 12/18/23   Adele Song, MD  clonazePAM  (KLONOPIN ) 0.5 MG tablet Take 1 tablet (0.5 mg total) by mouth 2 (two) times daily as needed for anxiety. 11/10/22   Christia Budds, MD  cyclobenzaprine  (FLEXERIL ) 10 MG tablet Take 1 tablet (10 mg total) by mouth 2 (two) times daily as needed for muscle spasms. 01/26/23   Victor Lynwood DASEN, PA-C  ibuprofen  (ADVIL ) 800 MG tablet Take 1 tablet (800 mg total) by mouth 3 (three) times daily. 10/22/22   Mesner, Selinda, MD  lidocaine  (LIDODERM ) 5 % Place 1 patch onto the skin daily. Remove & Discard patch within 12 hours or as directed by MD 01/26/23   Victor Lynwood DASEN, PA-C  meclizine  (ANTIVERT ) 25 MG tablet Take 1 tablet (25 mg total) by mouth 3  (three) times daily as needed. 01/23/23   Clark, Meghan R, PA-C  ondansetron  (ZOFRAN ) 4 MG tablet Take 1 tablet (4 mg total) by mouth every 8 (eight) hours as needed for nausea or vomiting. 11/24/23   Bauer, Collin S, PA-C  ondansetron  (ZOFRAN ) 4 MG tablet Take 1 tablet (4 mg total) by mouth every 8 (eight) hours as needed for up to 12 doses for nausea or vomiting. 12/12/23   Hoy Nidia FALCON, PA-C  oxyCODONE  (ROXICODONE ) 5 MG immediate release tablet Take 1 tablet (5 mg total) by mouth every 4 (four) hours as needed for severe pain (pain score 7-10). 12/12/23   Simon Lavonia SAILOR, MD  oxyCODONE -acetaminophen  (PERCOCET/ROXICET) 5-325 MG tablet Take 1 tablet by mouth every 6 (six) hours as needed for severe pain (pain score 7-10). 11/24/23   Bauer, Collin S, PA-C  senna-docusate (SENOKOT-S) 8.6-50 MG tablet Take 1 tablet by mouth daily. 09/25/22   Kingsley, Victoria K, DO  triamcinolone  cream (KENALOG ) 0.1 % APPLY TO AFFECTED AREA TWICE A DAY 08/27/22   Malvina Ellen, MD  Vitamin D , Ergocalciferol , (DRISDOL ) 1.25 MG (50000 UNIT) CAPS capsule TAKE 1 CAPSULE BY MOUTH EVERY 7 DAYS. WHEN YOU RUN OUT OF THESE 12 TABLETS, PLEASE RETURN TO CLINIC FOR VIT. D LEVEL RECHECK. 08/23/22   Malvina Ellen, MD    Allergies: Tomato, Augmentin  [amoxicillin -pot clavulanate], Mushroom extract complex (obsolete), and Reglan  [metoclopramide ]    Review of Systems  Updated Vital Signs BP 124/84   Pulse 92   Temp 98.8 F (37.1 C)   Resp 18   Ht 4' 11 (1.499 m)   Wt 56.7 kg   LMP 12/15/2023   SpO2 100%   BMI 25.25 kg/m   Physical Exam Vitals and nursing note reviewed.  Constitutional:      General: She is not in acute distress.    Appearance: She is well-developed. She is not ill-appearing.  HENT:     Head: Normocephalic and atraumatic.     Mouth/Throat:     Mouth: Mucous membranes are moist.  Eyes:     Extraocular Movements: Extraocular movements intact.     Conjunctiva/sclera: Conjunctivae normal.      Pupils: Pupils are equal, round, and reactive to light.  Cardiovascular:     Rate and Rhythm: Normal rate and regular rhythm.     Heart sounds: Normal heart sounds. No murmur heard. Pulmonary:     Effort: Pulmonary effort is normal. No respiratory distress.     Breath sounds: Normal breath sounds.  Abdominal:     Palpations: Abdomen is soft.     Tenderness: There is no abdominal tenderness.  Musculoskeletal:        General: No swelling.     Cervical back: Neck supple.  Skin:    General: Skin is warm and dry.     Capillary Refill: Capillary refill takes less than 2 seconds.  Neurological:     General: No focal deficit present.     Mental Status: She is alert.  Psychiatric:        Mood and Affect: Mood normal.     (all labs ordered are listed, but only abnormal results are displayed) Labs Reviewed  CBC WITH DIFFERENTIAL/PLATELET - Abnormal; Notable for the following components:      Result Value   WBC 3.5 (*)    All other components within normal limits  COMPREHENSIVE METABOLIC PANEL WITH GFR - Abnormal; Notable for the following components:   AST 14 (*)    All other components within normal limits  URINALYSIS, ROUTINE W REFLEX MICROSCOPIC - Abnormal; Notable for the following components:   APPearance HAZY (*)    Protein, ur TRACE (*)    Leukocytes,Ua MODERATE (*)    Bacteria, UA RARE (*)    All other components within normal limits  LIPASE, BLOOD  PREGNANCY, URINE    EKG: None  Radiology: No results found.   Procedures   Medications Ordered in the ED  sodium chloride  0.9 % bolus 1,000 mL (1,000 mLs Intravenous New Bag/Given 12/28/23 0842)  ondansetron  (ZOFRAN ) injection 4 mg (4 mg Intravenous Given 12/28/23 0844)  oxyCODONE -acetaminophen  (PERCOCET/ROXICET) 5-325 MG per tablet 1 tablet (1 tablet Oral Given 12/28/23 0850)                                    Medical Decision Making Amount and/or Complexity of Data Reviewed Labs: ordered.  Risk Prescription  drug management.   Wanda Nixon is here with nausea vomiting diarrhea.  Normal vitals.  No fever.  History of endometriosis anxiety.  Abdomen is overall benign.  Not very focally tender.  Symptoms started yesterday.  Differential diagnosis likely food poisoning versus nonspecific colitis versus viral process.  I have much lower concern for bowel obstruction other intra-abdominal process at this time.  Will check basic labs give IV fluids IV Dilaudid  and IV Zofran   and reevaluate.  Lab work overall unremarkable.  No significant leukocytosis anemia or electrolyte abnormality.  Urinalysis looks like a contamination.  Is not having any urinary symptoms.  Overall she is feeling better.  I do suspect some sort of viral GI illness may be foodborne illness.  I do not think she needs any abdominal imaging at this time but she is given strict return precautions including fever, worsening pain.  Discharge in good condition.  Understands return precautions.  Will prescribe antiemetic.  This chart was dictated using voice recognition software.  Despite best efforts to proofread,  errors can occur which can change the documentation meaning.      Final diagnoses:  Nausea and vomiting, unspecified vomiting type  Diarrhea, unspecified type    ED Discharge Orders          Ordered    ondansetron  (ZOFRAN -ODT) 4 MG disintegrating tablet  Every 8 hours PRN        12/28/23 0917               Ruthe Cornet, DO 12/28/23 [phone]

## 2023-12-28 NOTE — Discharge Instructions (Signed)
 Consider taking Imodium if diarrhea is persisting.  I have prescribed you Zofran  for nausea and vomiting.  Return if symptoms worsen as we discussed.  Follow-up with your primary care doctor.

## 2023-12-28 NOTE — ED Triage Notes (Signed)
 Pt endorses hx of endometriosis, reports n/v/d and generalized pain and weakness starting yesterday

## 2024-01-06 ENCOUNTER — Other Ambulatory Visit: Payer: Self-pay

## 2024-01-06 ENCOUNTER — Emergency Department (HOSPITAL_BASED_OUTPATIENT_CLINIC_OR_DEPARTMENT_OTHER)
Admission: EM | Admit: 2024-01-06 | Discharge: 2024-01-06 | Disposition: A | Discharge status: Home / Self Care | Attending: Emergency Medicine | Admitting: Emergency Medicine

## 2024-01-06 DIAGNOSIS — R102 Pelvic and perineal pain: Secondary | ICD-10-CM | POA: Diagnosis not present

## 2024-01-06 DIAGNOSIS — N809 Endometriosis, unspecified: Secondary | ICD-10-CM | POA: Diagnosis not present

## 2024-01-06 LAB — CBC
HCT: 33.1 % — ABNORMAL LOW (ref 36.0–46.0)
Hemoglobin: 11.6 g/dL — ABNORMAL LOW (ref 12.0–15.0)
MCH: 29.4 pg (ref 26.0–34.0)
MCHC: 35 g/dL (ref 30.0–36.0)
MCV: 83.8 fL (ref 80.0–100.0)
Platelets: 319 10*3/uL (ref 150–400)
RBC: 3.95 MIL/uL (ref 3.87–5.11)
RDW: 12.6 % (ref 11.5–15.5)
WBC: 4.6 10*3/uL (ref 4.0–10.5)
nRBC: 0 % (ref 0.0–0.2)

## 2024-01-06 MED ORDER — HYDROCODONE-ACETAMINOPHEN 5-325 MG PO TABS: 1.0000 | ORAL_TABLET | ORAL | 0 refills | Status: DC | PRN

## 2024-01-06 MED ORDER — HYDROCODONE-ACETAMINOPHEN 5-325 MG PO TABS
1.0000 | ORAL_TABLET | Freq: Once | ORAL | Status: AC
  Administered 2024-01-06: 1 via ORAL
  Filled 2024-01-06: qty 1

## 2024-01-06 NOTE — ED Triage Notes (Signed)
 Patient reports chronic pelvic pain. Recently switched OB and hasn't been able to see him yet. States on her menstrual period and has endometriosis.

## 2024-01-06 NOTE — Discharge Instructions (Signed)
 You were seen for your pelvic pain in the emergency department.  It is likely from your endometriosis.  At home, please take Tylenol  and ibuprofen  for your pain.  Use over-the-counter lidocaine  patches.  Use the Norco that we have prescribed you for breakthrough pain.  Do not take it before driving.  Check your MyChart online for the results of any tests that had not resulted by the time you left the emergency department.   Follow-up with your primary doctor in 2-3 days regarding your visit.  Follow-up with your OB/GYN or pain clinic as soon as possible  Return immediately to the emergency department if you experience any of the following: Worsening pain, fevers, or any other concerning symptoms.    Thank you for visiting our Emergency Department. It was a pleasure taking care of you today.

## 2024-01-06 NOTE — ED Notes (Signed)
 Reviewed discharge instructions, medications, and home care with pt. Pt verbalized understanding and had no further questions. Pt exited ED without complications.

## 2024-01-06 NOTE — ED Provider Notes (Signed)
 [place] EMERGENCY DEPARTMENT AT [facility] Provider Note   CSN: [id number] Arrival date & time: 01/06/24  1507     Patient presents with: Pelvic Pain   Wanda Nixon is a 33 y.o. female.   33 year old female with history of chronic pelvic pain and endometriosis status post salpingectomy who presents emergency department with abdominal pain.  Says that her menstrual period started yesterday.  Is gone through 3-4 pads and has passed some large clots.  Says that she is having severe cramping of her lower abdomen that is similar to prior flareups of her chronic pain.  Has been seen in the emergency department 15 times for her pelvic pain this year.  Says that she was following with the pain clinic in [place] but unfortunately they told her that they cannot see her anymore because of a missed appointment.       Prior to Admission medications   Medication Sig Start Date End Date Taking? Authorizing Provider  HYDROcodone -acetaminophen  (NORCO/VICODIN) 5-325 MG tablet Take 1 tablet by mouth every 4 (four) hours as needed. 01/06/24  Yes Wanda Lamar JAYSON, MD  albuterol  (VENTOLIN  HFA) 108 (90 Base) MCG/ACT inhaler Inhale 2 puffs into the lungs every 6 (six) hours as needed for wheezing or shortness of breath. 05/06/22   Wanda Bianchi, MD  citalopram  (CELEXA ) 20 MG tablet TAKE 1 TABLET (20 MG TOTAL) BY MOUTH DAILY. NEED PCP FOLLOW UP. 12/18/23   Wanda Song, MD  clonazePAM  (KLONOPIN ) 0.5 MG tablet Take 1 tablet (0.5 mg total) by mouth 2 (two) times daily as needed for anxiety. 11/10/22   Wanda Budds, MD  cyclobenzaprine  (FLEXERIL ) 10 MG tablet Take 1 tablet (10 mg total) by mouth 2 (two) times daily as needed for muscle spasms. 01/26/23   Wanda Lynwood DASEN, PA-C  ibuprofen  (ADVIL ) 800 MG tablet Take 1 tablet (800 mg total) by mouth 3 (three) times daily. 10/22/22   Mesner, Selinda, MD  lidocaine  (LIDODERM ) 5 % Place 1 patch onto the skin daily. Remove & Discard patch within 12 hours  or as directed by MD 01/26/23   Wanda Lynwood DASEN, PA-C  meclizine  (ANTIVERT ) 25 MG tablet Take 1 tablet (25 mg total) by mouth 3 (three) times daily as needed. 01/23/23   Clark, Meghan R, PA-C  ondansetron  (ZOFRAN ) 4 MG tablet Take 1 tablet (4 mg total) by mouth every 8 (eight) hours as needed for nausea or vomiting. 11/24/23   Bauer, Collin S, PA-C  ondansetron  (ZOFRAN ) 4 MG tablet Take 1 tablet (4 mg total) by mouth every 8 (eight) hours as needed for up to 12 doses for nausea or vomiting. 12/12/23   Wanda Nidia FALCON, PA-C  ondansetron  (ZOFRAN -ODT) 4 MG disintegrating tablet Take 1 tablet (4 mg total) by mouth every 8 (eight) hours as needed. 12/28/23   Curatolo, Adam, DO  senna-docusate (SENOKOT-Nixon) 8.6-50 MG tablet Take 1 tablet by mouth daily. 09/25/22   Kingsley, Victoria K, DO  triamcinolone  cream (KENALOG ) 0.1 % APPLY TO AFFECTED AREA TWICE A DAY 08/27/22   Wanda Ellen, MD  Vitamin D , Ergocalciferol , (DRISDOL ) 1.25 MG (50000 UNIT) CAPS capsule TAKE 1 CAPSULE BY MOUTH EVERY 7 DAYS. WHEN YOU RUN OUT OF THESE 12 TABLETS, PLEASE RETURN TO CLINIC FOR VIT. D LEVEL RECHECK. 08/23/22   Wanda Ellen, MD    Allergies: Tomato, Augmentin  [amoxicillin -pot clavulanate], Mushroom extract complex (obsolete), and Reglan  [metoclopramide ]    Review of Systems  Updated Vital Signs BP (!) 118/96 (BP Location: Right Arm)   Pulse  83   Temp 97.6 F (36.4 C)   Resp 16   LMP 12/15/2023   SpO2 99%   Physical Exam Constitutional:      Appearance: Normal appearance.  Abdominal:     General: There is no distension.     Palpations: There is no mass.     Tenderness: There is no abdominal tenderness. There is no guarding.  Neurological:     Mental Status: She is alert.     (all labs ordered are listed, but only abnormal results are displayed) Labs Reviewed  CBC - Abnormal; Notable for the following components:      Result Value   Hemoglobin 11.6 (*)    HCT 33.1 (*)    All other components within  normal limits    EKG: None  Radiology: No results found.   Procedures   Medications Ordered in the ED  HYDROcodone -acetaminophen  (NORCO/VICODIN) 5-325 MG per tablet 1 tablet (1 tablet Oral Given 01/06/24 1638)                                    Medical Decision Making Amount and/or Complexity of Data Reviewed Labs: ordered.  Risk Prescription drug management.   Wanda Nixon is a 33 year old female with history of chronic pelvic pain and endometriosis status post salpingectomy who presents emergency department with abdominal pain.   Initial Ddx:  Endometriosis, dysmenorrhea, anemia  MDM/Course:  Pt presents with acute on chronic abdominal pain. Has been seen multiple times for this. Says it is similar to prior presentations. Low concern for acute life threatening pathology that would require imaging at this time.  Given hydrocodone  and upon re-evaluation was feeling better. Hgb stable from prior so do not feel she would need a transfusion for her vaginal bleeding.  Given information for pain clinic care in [place] to follow-up with  This patient presents to the ED for concern of complaints listed in HPI, this involves an extensive number of treatment options, and is a complaint that carries with it a high risk of complications and morbidity. Disposition including potential need for admission considered.   Dispo: DC Home. Return precautions discussed including, but not limited to, those listed in the AVS. Allowed pt time to ask questions which were answered fully prior to dc.  Records reviewed ED Visit Notes The following labs were independently interpreted: CBC and show chronic anemia I have reviewed the patients home medications and made adjustments as needed  Portions of this note were generated with Dragon dictation software. Dictation errors may occur despite best attempts at proofreading.     Final diagnoses:  Endometriosis    ED Discharge Orders           Ordered    HYDROcodone -acetaminophen  (NORCO/VICODIN) 5-325 MG tablet  Every 4 hours PRN        01/06/24 1641               Wanda Lamar BROCKS, MD 01/06/24 1701

## 2024-01-08 ENCOUNTER — Other Ambulatory Visit (HOSPITAL_BASED_OUTPATIENT_CLINIC_OR_DEPARTMENT_OTHER): Payer: Self-pay

## 2024-01-08 DIAGNOSIS — Z719 Counseling, unspecified: Secondary | ICD-10-CM | POA: Diagnosis not present

## 2024-01-08 DIAGNOSIS — N809 Endometriosis, unspecified: Secondary | ICD-10-CM | POA: Diagnosis not present

## 2024-01-08 NOTE — Progress Notes (Signed)
 SUBJECTIVE:  Wanda Nixon is a 33 y.o. female with a past medical history of reported endometriosis which she unfortunately is dealing with severe pain from.  States that she started her period on Saturday and was seen at a local emergency room due to the pain.  States that her evaluation was unremarkable and she was given Norco pain medicine and sent home however she states the pain has continued and she is unable to get relief.  She states that she is extremely weak and fatigued and feels very dehydrated due to her period.  She is under the care of OB/GYN however is in the process of switching to a different OB/GYN as she feels that she has not been listened to to the severity of her pain.  She currently does not take anything for her painful periods at baseline.  She states that she utilizes heating pads, over-the-counter medication and prescription medications when she has them.  She states that she used to go to a pain specialist due to her pain response and did not find relief with narcotic pain medicine so stopped going for follow-ups.  She reports nausea but denies any vomiting.  Does report headache, fatigue and lethargy.  No history of sickle cell that she is aware of. Parts of patient history reviewed include PMH, problem list, medications, allergies, social history, family history, and surgical history.  OBJECTIVE: ROS:  General: Denies fever, chills, reports fatigue and appears to be in mild pain distress Skin: Denies rash Lungs: Denies shortness of breath or cough Heart: Denies chest pain, palpitations Neurologic: Reports headache and lethargy Abdomen: Denies pain, nausea or vomiting Genitourinary: Reports vaginal bleeding related to her monthly menses   Vitals:   01/08/24 1954  BP: (!) 134/98  BP Location: Left arm  Patient Position: Sitting  Pulse: 69  Resp: 18  Temp: 98.2 F (36.8 C)  TempSrc: Tympanic  SpO2: 100%  Weight: 56.7 kg (125 lb)     General:  Patient is well-nourished and in mild pain distress Skin:  No rash noted. Head:  Normocephalic, atraumatic.   Mouth/pharynx: Gingiva and mucosa pink.  No erythema or exudate bilaterally.   Neck: NROM Lungs: CTA bilaterally.  Normal effort. Heart: NRR with no murmurs, rubs, or gallops.   Neurologic: Alert and oriented Abdomen: Diffuse abdominal tenderness with no rebound and slight guarding.  Bloating noted (patient correlates with findings). Genitourinary: Declined exam due to pain  Urinalysis results:  Lab Results  Component Value Date   COLORU Dark yellow 01/28/2020   SPECGRAV 1.030 (A) 01/28/2020   PROTEINUA Negative 01/28/2020   GLUCOSEU Negative 01/28/2020   KETONEU Negative 01/28/2020   BILIRUBINUR Negative 01/28/2020   BLOODU Negative 01/28/2020   UROBILINOGEN 0.2 01/28/2020   LEUKOUA Trace (A) 01/28/2020      ASSESSMENT:   1. Endometriosis  sodium chloride  (bolus) 0.9 % bolus 1,000 mL   CBC with Differential   Anemia Profile   ketorolac  (TORADOL ) injection 30 mg   DISCONTINUED: ketorolac  (TORADOL ) injection 30 mg   DISCONTINUED: ketorolac  (TORADOL ) injection 30 mg    2. Health education/counseling        MDM: Wanda Nixon is a 33 y.o. female with a past medical history of reported endometriosis which she unfortunately is dealing with severe pain from.  States that she started her period on Saturday and was seen at a local emergency room due to the pain.  States that her evaluation was unremarkable and she was given Norco pain medicine  and sent home however she states the pain has continued and she is unable to get relief.  She states that she is extremely weak and fatigued and feels very dehydrated due to her period.  She is under the care of OB/GYN however is in the process of switching to a different OB/GYN as she feels that she has not been listened to to the severity of her pain.  She currently does not take anything for her painful periods at baseline.   She states that she utilizes heating pads, over-the-counter medication and prescription medications when she has them.  She states that she used to go to a pain specialist due to her pain response and did not find relief with narcotic pain medicine so stopped going for follow-ups.  She reports nausea but denies any vomiting.  Does report headache, fatigue and lethargy.  Per chart review did not have any imaging done at the emergency room a few days ago however did have an ultrasound transvaginal done back in July 2025 that indicated a normal endometrial stripe with a 13 mm endometrium.  Her UA did not indicate any evidence of hematuria however her abdominal exam correlated with diffuse tenderness and bloating with obvious pain response.  Patient has not attempted or had any type of attempting of NSAID trials so discussed doing IV fluids due to her lethargy and weakness complaints along with IV NSAID to see if that will give her relief.  Patient found great relief following the completion of both of these interventions which was encouraging to see.  Did obtain blood work to rule out underlying iron deficient anemia and to verify no anemia that would prompt additional evaluation.  Patient was advised to follow up with OB/GYN which she is planning to schedule with a new OB/GYN this week.  Did send medication in for diclofenac trial to treat during menses with the knowledge she is to wait to take the first dose until 01/09/2024 and then follow-up prescription   Differentials: Menses, endometriosis, dehydration, acute blood loss anemia  PLAN: Discussed above findings and plan with patient who is in agreement.  Call or return with any questions or concerns Spoke at length about changing or worsening of symptoms that should prompt going directly to the closest ER....  Patient is agreeable with assessment and plan.  Urgent Care Disposition:  Routine Follow Up with Specialist  Patient's presentation is most  consistent with acute complicated illness / injury requiring diagnostic workup.    Electronically signed by: Charleen Tammy Lot, NP 01/08/2024 10:56 PM

## 2024-01-10 ENCOUNTER — Other Ambulatory Visit: Payer: Self-pay

## 2024-01-10 ENCOUNTER — Emergency Department (HOSPITAL_BASED_OUTPATIENT_CLINIC_OR_DEPARTMENT_OTHER)
Admission: EM | Admit: 2024-01-10 | Discharge: 2024-01-10 | Disposition: A | Discharge status: Home / Self Care | Attending: Emergency Medicine | Admitting: Emergency Medicine

## 2024-01-10 ENCOUNTER — Encounter (HOSPITAL_BASED_OUTPATIENT_CLINIC_OR_DEPARTMENT_OTHER): Payer: Self-pay | Admitting: Emergency Medicine

## 2024-01-10 DIAGNOSIS — G8929 Other chronic pain: Secondary | ICD-10-CM | POA: Diagnosis not present

## 2024-01-10 DIAGNOSIS — R102 Pelvic and perineal pain: Secondary | ICD-10-CM | POA: Insufficient documentation

## 2024-01-10 LAB — URINALYSIS, MICROSCOPIC (REFLEX)

## 2024-01-10 LAB — URINALYSIS, ROUTINE W REFLEX MICROSCOPIC
Bilirubin Urine: NEGATIVE
Glucose, UA: NEGATIVE mg/dL
Leukocytes,Ua: NEGATIVE
Nitrite: NEGATIVE
Protein, ur: NEGATIVE mg/dL
Specific Gravity, Urine: 1.02 (ref 1.005–1.030)
pH: 6.5 (ref 5.0–8.0)

## 2024-01-10 LAB — CBC WITH DIFFERENTIAL/PLATELET
Abs Immature Granulocytes: 0.01 K/uL (ref 0.00–0.07)
Basophils Absolute: 0 K/uL (ref 0.0–0.1)
Basophils Relative: 0 %
Eosinophils Absolute: 0.1 K/uL (ref 0.0–0.5)
Eosinophils Relative: 1 %
HCT: 35.5 % — ABNORMAL LOW (ref 36.0–46.0)
Hemoglobin: 12.4 g/dL (ref 12.0–15.0)
Immature Granulocytes: 0 %
Lymphocytes Relative: 28 %
Lymphs Abs: 1.2 K/uL (ref 0.7–4.0)
MCH: 29.3 pg (ref 26.0–34.0)
MCHC: 34.9 g/dL (ref 30.0–36.0)
MCV: 83.9 fL (ref 80.0–100.0)
Monocytes Absolute: 0.3 K/uL (ref 0.1–1.0)
Monocytes Relative: 6 %
Neutro Abs: 2.7 K/uL (ref 1.7–7.7)
Neutrophils Relative %: 65 %
Platelets: 353 K/uL (ref 150–400)
RBC: 4.23 MIL/uL (ref 3.87–5.11)
RDW: 12.8 % (ref 11.5–15.5)
WBC: 4.2 K/uL (ref 4.0–10.5)
nRBC: 0 % (ref 0.0–0.2)

## 2024-01-10 LAB — PREGNANCY, URINE: Preg Test, Ur: NEGATIVE

## 2024-01-10 MED ORDER — OXYCODONE-ACETAMINOPHEN 5-325 MG PO TABS
1.0000 | ORAL_TABLET | Freq: Once | ORAL | Status: AC
  Administered 2024-01-10: 1 via ORAL
  Filled 2024-01-10: qty 1

## 2024-01-10 MED ORDER — NAPROXEN 500 MG PO TABS: 500.0000 mg | ORAL_TABLET | Freq: Two times a day (BID) | ORAL | 0 refills | Status: DC

## 2024-01-10 NOTE — ED Triage Notes (Signed)
 Reports chronic pelvic pain. States related to period. Hx of endometriosis.

## 2024-01-10 NOTE — ED Provider Notes (Signed)
 [place] EMERGENCY DEPARTMENT AT [facility] Provider Note   CSN: [id number] Arrival date & time: 01/10/24  1427     Patient presents with: Pelvic Pain   Wanda Nixon is a 33 y.o. female with a past medical history significant for chronic pelvic pain and endometriosis who presents to the ED due to pelvic pain.  Patient notes she has had significant pain over the past few days.  Finished her menstrual cycle yesterday.  Admits to generalized weakness and fatigue.  Notes she was bleeding heavier than normal.  Denies chest pain and shortness of breath.  No lightheadedness.  Patient seen in the ED on 9/6 for the same complaint where she was discharged with hydrocodone .  Patient notes she has been taking hydrocodone  with no relief in pelvic pain.  She notes she was recently kicked out of pain management due to missed appointments and no longer gets chronic oxycodone .  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  Denies any urinary symptoms.  Denies vaginal discharge.  No concern for STIs.  Patient notes this feels like her normal pelvic pain.  No changes from baseline.  Had an ultrasound July 22 which was unremarkable.  Denies fever and chills.  History obtained from patient and past medical records. No interpreter used during encounter.      Prior to Admission medications   Medication Sig Start Date End Date Taking? Authorizing Provider  naproxen  (NAPROSYN ) 500 MG tablet Take 1 tablet (500 mg total) by mouth 2 (two) times daily. 01/10/24  Yes Keiasia Christianson, Aleck JAYSON, PA-C  albuterol  (VENTOLIN  HFA) 108 (90 Base) MCG/ACT inhaler Inhale 2 puffs into the lungs every 6 (six) hours as needed for wheezing or shortness of breath. 05/06/22   Bryan Bianchi, MD  citalopram  (CELEXA ) 20 MG tablet TAKE 1 TABLET (20 MG TOTAL) BY MOUTH DAILY. NEED PCP FOLLOW UP. 12/18/23   Adele Song, MD  clonazePAM  (KLONOPIN ) 0.5 MG tablet Take 1 tablet (0.5 mg total) by mouth 2 (two) times daily as needed for anxiety.  11/10/22   Christia Budds, MD  cyclobenzaprine  (FLEXERIL ) 10 MG tablet Take 1 tablet (10 mg total) by mouth 2 (two) times daily as needed for muscle spasms. 01/26/23   Victor Lynwood DASEN, PA-C  HYDROcodone -acetaminophen  (NORCO/VICODIN) 5-325 MG tablet Take 1 tablet by mouth every 4 (four) hours as needed. 01/06/24   Yolande Lamar JAYSON, MD  ibuprofen  (ADVIL ) 800 MG tablet Take 1 tablet (800 mg total) by mouth 3 (three) times daily. 10/22/22   Mesner, Selinda, MD  lidocaine  (LIDODERM ) 5 % Place 1 patch onto the skin daily. Remove & Discard patch within 12 hours or as directed by MD 01/26/23   Victor Lynwood DASEN, PA-C  meclizine  (ANTIVERT ) 25 MG tablet Take 1 tablet (25 mg total) by mouth 3 (three) times daily as needed. 01/23/23   Clark, Meghan R, PA-C  ondansetron  (ZOFRAN ) 4 MG tablet Take 1 tablet (4 mg total) by mouth every 8 (eight) hours as needed for nausea or vomiting. 11/24/23   Bauer, Collin S, PA-C  ondansetron  (ZOFRAN ) 4 MG tablet Take 1 tablet (4 mg total) by mouth every 8 (eight) hours as needed for up to 12 doses for nausea or vomiting. 12/12/23   Hoy Nidia FALCON, PA-C  ondansetron  (ZOFRAN -ODT) 4 MG disintegrating tablet Take 1 tablet (4 mg total) by mouth every 8 (eight) hours as needed. 12/28/23   Curatolo, Adam, DO  senna-docusate (SENOKOT-S) 8.6-50 MG tablet Take 1 tablet by mouth daily. 09/25/22  Kingsley, Victoria K, DO  triamcinolone  cream (KENALOG ) 0.1 % APPLY TO AFFECTED AREA TWICE A DAY 08/27/22   Malvina Ellen, MD  Vitamin D , Ergocalciferol , (DRISDOL ) 1.25 MG (50000 UNIT) CAPS capsule TAKE 1 CAPSULE BY MOUTH EVERY 7 DAYS. WHEN YOU RUN OUT OF THESE 12 TABLETS, PLEASE RETURN TO CLINIC FOR VIT. D LEVEL RECHECK. 08/23/22   Malvina Ellen, MD    Allergies: Tomato, Augmentin  [amoxicillin -pot clavulanate], Mushroom extract complex (obsolete), and Reglan  [metoclopramide ]    Review of Systems  Constitutional:  Positive for fatigue.  Respiratory:  Negative for shortness of breath.    Cardiovascular:  Negative for chest pain.  Genitourinary:  Positive for pelvic pain. Negative for dysuria and vaginal discharge.    Updated Vital Signs BP (!) 133/105 (BP Location: Right Arm)   Pulse 82   Temp 98.2 F (36.8 C)   Resp 17   LMP 12/15/2023   SpO2 100%   Physical Exam Vitals and nursing note reviewed.  Constitutional:      General: She is not in acute distress.    Appearance: She is not ill-appearing.  HENT:     Head: Normocephalic.  Eyes:     Pupils: Pupils are equal, round, and reactive to light.  Cardiovascular:     Rate and Rhythm: Normal rate and regular rhythm.     Pulses: Normal pulses.     Heart sounds: Normal heart sounds. No murmur heard.    No friction rub. No gallop.  Pulmonary:     Effort: Pulmonary effort is normal.     Breath sounds: Normal breath sounds.  Abdominal:     General: Abdomen is flat. There is no distension.     Palpations: Abdomen is soft.     Tenderness: There is no abdominal tenderness. There is no guarding or rebound.     Comments: Mild suprapubic tenderness without rebound or guarding  Musculoskeletal:        General: Normal range of motion.     Cervical back: Neck supple.  Skin:    General: Skin is warm and dry.  Neurological:     General: No focal deficit present.     Mental Status: She is alert.  Psychiatric:        Mood and Affect: Mood normal.        Behavior: Behavior normal.     (all labs ordered are listed, but only abnormal results are displayed) Labs Reviewed  CBC WITH DIFFERENTIAL/PLATELET - Abnormal; Notable for the following components:      Result Value   HCT 35.5 (*)    All other components within normal limits  URINALYSIS, ROUTINE W REFLEX MICROSCOPIC - Abnormal; Notable for the following components:   Hgb urine dipstick TRACE (*)    Ketones, ur TRACE (*)    All other components within normal limits  URINALYSIS, MICROSCOPIC (REFLEX) - Abnormal; Notable for the following components:   Bacteria,  UA RARE (*)    All other components within normal limits  PREGNANCY, URINE    EKG: None  Radiology: No results found.   Procedures   Medications Ordered in the ED  oxyCODONE -acetaminophen  (PERCOCET/ROXICET) 5-325 MG per tablet 1 tablet (1 tablet Oral Given 01/10/24 1516)    Clinical Course as of 01/10/24 1602  Wed Jan 10, 2024  1548 Hemoglobin: 12.4 [CA]    Clinical Course User Index [CA] Lorelle Aleck BROCKS, PA-C  Medical Decision Making Amount and/or Complexity of Data Reviewed Labs: ordered. Decision-making details documented in ED Course.  Risk Prescription drug management.   This patient presents to the ED for concern of pelvic pain, this involves an extensive number of treatment options, and is a complaint that carries with it a high risk of complications and morbidity.  The differential diagnosis includes chronic pelvic pain, endometriosis, ectopic pregnancy, PID, etc  33 year old female presents to the ED due to acute on chronic pelvic pain.  History of endometriosis.  Recently let go of her pain management clinic due to missed appointments.  No longer on chronic oxycodone .  Finished her menstrual cycle yesterday.  Admits to fatigue and generalized weakness.  Notes her menstrual cycle was slightly heavier than normal.  No vaginal discharge.  Denies concern for STIs.  Upon arrival, stable vitals.  Patient in no acute distress.  Does have some mild suprapubic tenderness.  No rebound or guarding.  Low suspicion for acute abdomen.  Patient had a unremarkable ultrasound on 7/22.  She notes her pelvic pain is similar to her chronic pelvic pain so will hold off on further ultrasounds at this time.  CBC to check hemoglobin.  Patient given 1 dose of oxycodone .  CBC with normal hemoglobin.  No evidence of anemia that could be causing her fatigue and generalized weakness.  Normal white blood cell count.  UA with trace hematuria and ketonuria.  No  evidence of infection.  Pregnancy test negative.  Low suspicion for ectopic pregnancy.  Reassessed patient after oxycodone  who notes significant improvement in pain.  Notes it feels similar to her chronic pelvic pain.  Does not feel like she needs another ultrasound.  Patient discharged with naproxen .  Advised patient she needs to follow-up with PCP for another referral to pain management. Low suspicion for any emergent etiologies of pelvic pain. Patient stable for discharge. Strict ED precautions discussed with patient. Patient states understanding and agrees to plan. Patient discharged home in no acute distress and stable vitals  Chronic pain Has PCP    Final diagnoses:  Chronic pelvic pain in female    ED Discharge Orders          Ordered    naproxen  (NAPROSYN ) 500 MG tablet  2 times daily        01/10/24 1557               Alishea Beaudin C, PA-C 01/10/24 1603    Patsey Lot, MD 01/11/24 [phone]

## 2024-01-10 NOTE — Discharge Instructions (Addendum)
 It was a pleasure taking care of you today.  As discussed, your hemoglobin was normal.  Please call your PCP for a pain management referral.  I am sending you home with naproxen .  Take as needed for pain.  Return to the ER for any worsening symptoms.

## 2024-01-12 ENCOUNTER — Ambulatory Visit (INDEPENDENT_AMBULATORY_CARE_PROVIDER_SITE_OTHER): Admitting: Family Medicine

## 2024-01-12 ENCOUNTER — Encounter: Payer: Self-pay | Admitting: Family Medicine

## 2024-01-12 VITALS — BP 119/91 | HR 80 | Ht 59.0 in | Wt 121.8 lb

## 2024-01-12 DIAGNOSIS — N809 Endometriosis, unspecified: Secondary | ICD-10-CM | POA: Diagnosis not present

## 2024-01-12 DIAGNOSIS — D649 Anemia, unspecified: Secondary | ICD-10-CM

## 2024-01-12 DIAGNOSIS — R102 Pelvic and perineal pain: Secondary | ICD-10-CM | POA: Diagnosis not present

## 2024-01-12 MED ORDER — OXYCODONE HCL ER 15 MG PO T12A: 15.0000 mg | EXTENDED_RELEASE_TABLET | Freq: Two times a day (BID) | ORAL | 0 refills | Status: DC

## 2024-01-12 NOTE — Patient Instructions (Signed)
 It was great to see you!  Our plans for today:  - We are referring you to Pain Management and Gynecology. Let us  know if you don't hear about an appointment in the next few weeks.  - We refilled your oxycontin , be sure to get refills from Pain Management once you establish with them.  - Consider taking iron supplement.  Take care and seek immediate care sooner if you develop any concerns.   Dr. Brunilda Eble

## 2024-01-12 NOTE — Assessment & Plan Note (Signed)
 Mild. 2/2 blood loss during menses, recommend OTC iron supplement.

## 2024-01-12 NOTE — Progress Notes (Signed)
    SUBJECTIVE:   CHIEF COMPLAINT / HPI:   Discussed the use of AI scribe software for clinical note transcription with the patient, who gave verbal consent to proceed.  History of Present Illness Wanda Nixon is a 33 year old female with endometriosis who presents for pain management referral.  Endometriosis, pelvic pain - Worsening pelvic pain related to endometriosis over the past year - Multiple emergency room visits for severe pain - Debilitating pain impairs ability to work and care for her children - associated with menorrhagia requiring three to four pads within a few hours - associated with nausea - previously saw pain clinic and only thing that provided relief was oxycontin  15mg . Previously tried hydrocodone . Needs new referral for pain clinic.  - Prescribed diclofenac to take prior to onset of menses after last ED visit, not yet initiated - Single mother of three children, wants to preserve fertility, desires more children with subsequent partner. Needs new referral for GYN to discuss treatment options. - Symptoms significantly impact ability to perform daily tasks and care for family   Pelvic US  10/2023 negative. CT A/P with mod stool otherwise nl. Recent labs, UA normal.  H/o endo, s/p surgery. Preivously considering for hysterectomy but has desire to preserve fertility.  OBJECTIVE:   BP (!) 119/91   Pulse 80   Ht 4' 11 (1.499 m)   Wt 121 lb 12.8 oz (55.2 kg)   LMP 01/06/2024   SpO2 100%   BMI 24.60 kg/m   Gen: well appearing, in NAD Card: Reg rate Lungs: Comfortable WOB on RA Ext: WWP, no edema  ASSESSMENT/PLAN:   Pelvic pain Chronic, ongoing, uncontrolled. 2/2 endometriosis, exacerbated during menses with previous surgical intervention providing minimal relief. Will refill oxycontin , referred to pain management, GYN for treatment options.  Mild anemia Mild. 2/2 blood loss during menses, recommend OTC iron supplement.   Donald CHRISTELLA Lai, DO

## 2024-01-12 NOTE — Telephone Encounter (Signed)
 Called and cancelled rx at [facility].   Please resend to [facility] location.   Thanks.   Chiquita JAYSON English, RN

## 2024-01-12 NOTE — Assessment & Plan Note (Addendum)
 Chronic, ongoing, uncontrolled. 2/2 endometriosis, exacerbated during menses with previous surgical intervention providing minimal relief. Will refill oxycontin , referred to pain management, GYN for treatment options.

## 2024-01-19 ENCOUNTER — Telehealth: Payer: Self-pay | Admitting: *Deleted

## 2024-01-19 NOTE — Telephone Encounter (Signed)
 Pt completed a form requesting PCP change from Dr. Lafe to Dr. Madelon.  She does not have any issues with Dr. Lafe but has met Dr. Rumball and felt she provided good care.   At this time Dr. Rumball is not accepting new patients.  Attempted to call patient but no answer and VM was not set up.  Will await callback. Harlene Carte, CMA

## 2024-01-29 ENCOUNTER — Encounter: Payer: Self-pay | Admitting: Family Medicine

## 2024-01-29 ENCOUNTER — Other Ambulatory Visit: Payer: Self-pay | Admitting: Family Medicine

## 2024-01-29 DIAGNOSIS — Z20822 Contact with and (suspected) exposure to covid-19: Secondary | ICD-10-CM

## 2024-01-29 MED ORDER — OXYCODONE HCL ER 15 MG PO T12A: 15.0000 mg | EXTENDED_RELEASE_TABLET | Freq: Two times a day (BID) | ORAL | 0 refills | Status: DC

## 2024-01-29 NOTE — Progress Notes (Signed)
 Message received from pt requesting refill of oxycodone  15 mg previously prescribed by Dr. Rumball for severe pain associated with endometriosis and menstruation. She has been referred to pain clinic but does not appear that she has been scheduled yet. Oxycodone  refilled once and contact info as below provided for pain clinic so pt can set up an appointment for further management.  [facility]  [address] [place], [place] [address] [phone]

## 2024-02-06 ENCOUNTER — Encounter: Payer: Self-pay | Admitting: Family Medicine

## 2024-02-06 ENCOUNTER — Telehealth: Payer: Self-pay

## 2024-02-06 MED ORDER — ALBUTEROL SULFATE HFA 108 (90 BASE) MCG/ACT IN AERS
2.0000 | INHALATION_SPRAY | Freq: Four times a day (QID) | RESPIRATORY_TRACT | 2 refills | Status: AC | PRN
Start: 2024-02-06 — End: ?

## 2024-02-06 NOTE — Telephone Encounter (Signed)
 Patient's refill request went to this nurse's MyChart for refill and was completed by accident. Informed DO. This patient is not at [facility].

## 2024-02-06 NOTE — Telephone Encounter (Signed)
 Called patient.  Patient scheduled with PCP for 10/10.  She reports the oxycodone  has been helping control her pain, however she reports needing additional meds for breakthrough.   She reports using naproxen  or ibuprofen  once per day with minima relief.  She reports the pain management referral was denied. Per referral notes they do not treat a diagnoses of endometriosis.   Will forward to referral coordinator and PCP.

## 2024-02-09 ENCOUNTER — Ambulatory Visit (INDEPENDENT_AMBULATORY_CARE_PROVIDER_SITE_OTHER): Admitting: Family Medicine

## 2024-02-09 ENCOUNTER — Encounter: Payer: Self-pay | Admitting: Family Medicine

## 2024-02-09 VITALS — BP 115/84 | HR 90 | Wt 117.6 lb

## 2024-02-09 DIAGNOSIS — R41840 Attention and concentration deficit: Secondary | ICD-10-CM

## 2024-02-09 DIAGNOSIS — N809 Endometriosis, unspecified: Secondary | ICD-10-CM | POA: Diagnosis not present

## 2024-02-09 DIAGNOSIS — R102 Pelvic and perineal pain unspecified side: Secondary | ICD-10-CM | POA: Diagnosis not present

## 2024-02-09 MED ORDER — NORGESTIMATE-ETH ESTRADIOL 0.25-35 MG-MCG PO TABS: 1.0000 | ORAL_TABLET | Freq: Every day | ORAL | 11 refills | Status: DC

## 2024-02-09 MED ORDER — OXYCODONE HCL ER 15 MG PO T12A: 15.0000 mg | EXTENDED_RELEASE_TABLET | Freq: Two times a day (BID) | ORAL | 0 refills | Status: DC

## 2024-02-09 NOTE — Progress Notes (Addendum)
    SUBJECTIVE:   CHIEF COMPLAINT / HPI:   Endometriosis with pelvic pain Saw Dr. Rumball recently, referred to pain management and GYN, given interim oxycodone . Unfortunately pain management referral declined as they do not manage pelvic pain. She has not yet been scheduled with GYN.  Pain well-managed with oxycodone ; naproxen  and tylenol  less helpful. She is not currently on birth control, but is interested in OCPs. Pain is worst 2 weeks surrounding menses. Outside of this time she does not usually need any pain relief.  Also interested in ADHD evaluation - she has been noticing poor attention, etc in the last several years. Noticing symptoms in children too.  PERTINENT  PMH / PSH: reviewed.  OBJECTIVE:   BP 115/84   Pulse 90   Wt 117 lb 9.6 oz (53.3 kg)   LMP 01/06/2024   SpO2 99%   BMI 23.75 kg/m   General: well-appearing, very pleasant, no acute distress. HEENT: normocephalic, PERRLA, EOM grossly intact, MMM Pulm: No increased work of breathing. Extremities: no peripheral edema. Moves all extremities equally. Neuro: Alert and oriented x3, speech normal in content, no facial asymmetry Psych:  Cognition and judgment appear intact. Alert, communicative, and cooperative   ASSESSMENT/PLAN:   Assessment & Plan Endometriosis Chronic problem - she has had ablation previously which improved symptoms for some time, but now worsening. Unfortunately declined by pain management. - initiate OCPs - discussed importance of responsible oxycodone  use; I refilled today to get her to her GYN appt/give OCPs time to work, but discussed that this could not be an ongoing prescription - info given to make GYN appt Attention and concentration deficit Referral placed to Psych for evaluation.    Lauraine Norse, DO [facility]

## 2024-02-09 NOTE — Addendum Note (Signed)
 Addended by: LAFE DOMINO on: 02/09/2024 04:20 PM   Modules accepted: Level of Service

## 2024-02-09 NOTE — Patient Instructions (Addendum)
 I have referred you to Psychiatry for an ADHD evaluation - they should call you to make an appointment.  Endometriosis - I have given a one-time refill of oxycodone  to hopefully get you to your appt with GYN - as we discussed I will not be able to refill this quantity again in the future - continue using naproxen  and tylenol  as needed - start taking birth control pills daily - please call the OBGYN office to make an appointment  OBGYN referral sent to: [facility] [address],  [place], [place] [address] Phone: [phone]

## 2024-02-27 ENCOUNTER — Ambulatory Visit (HOSPITAL_BASED_OUTPATIENT_CLINIC_OR_DEPARTMENT_OTHER): Admitting: Family Medicine

## 2024-02-29 ENCOUNTER — Emergency Department (HOSPITAL_BASED_OUTPATIENT_CLINIC_OR_DEPARTMENT_OTHER)
Admission: EM | Admit: 2024-02-29 | Discharge: 2024-02-29 | Disposition: A | Discharge status: Home / Self Care | Attending: Emergency Medicine | Admitting: Emergency Medicine

## 2024-02-29 ENCOUNTER — Encounter (HOSPITAL_BASED_OUTPATIENT_CLINIC_OR_DEPARTMENT_OTHER): Payer: Self-pay

## 2024-02-29 ENCOUNTER — Other Ambulatory Visit: Payer: Self-pay

## 2024-02-29 DIAGNOSIS — N939 Abnormal uterine and vaginal bleeding, unspecified: Secondary | ICD-10-CM | POA: Diagnosis not present

## 2024-02-29 DIAGNOSIS — J45909 Unspecified asthma, uncomplicated: Secondary | ICD-10-CM | POA: Diagnosis not present

## 2024-02-29 DIAGNOSIS — Z79899 Other long term (current) drug therapy: Secondary | ICD-10-CM | POA: Insufficient documentation

## 2024-02-29 DIAGNOSIS — Z7951 Long term (current) use of inhaled steroids: Secondary | ICD-10-CM | POA: Diagnosis not present

## 2024-02-29 LAB — BASIC METABOLIC PANEL WITH GFR
Anion gap: 11 (ref 5–15)
BUN: 13 mg/dL (ref 6–20)
CO2: 22 mmol/L (ref 22–32)
Calcium: 9.4 mg/dL (ref 8.9–10.3)
Chloride: 105 mmol/L (ref 98–111)
Creatinine, Ser: 0.76 mg/dL (ref 0.44–1.00)
GFR, Estimated: >60 mL/min (ref >60)
Glucose, Bld: 82 mg/dL (ref 70–99)
Potassium: 3.9 mmol/L (ref 3.5–5.1)
Sodium: 138 mmol/L (ref 135–145)

## 2024-02-29 LAB — CBC WITH DIFFERENTIAL/PLATELET
Abs Immature Granulocytes: 0.01 K/uL (ref 0.00–0.07)
Basophils Absolute: 0 K/uL (ref 0.0–0.1)
Basophils Relative: 0 %
Eosinophils Absolute: 0 K/uL (ref 0.0–0.5)
Eosinophils Relative: 0 %
HCT: 32.3 % — ABNORMAL LOW (ref 36.0–46.0)
Hemoglobin: 11.2 g/dL — ABNORMAL LOW (ref 12.0–15.0)
Immature Granulocytes: 0 %
Lymphocytes Relative: 19 %
Lymphs Abs: 0.9 K/uL (ref 0.7–4.0)
MCH: 29.3 pg (ref 26.0–34.0)
MCHC: 34.7 g/dL (ref 30.0–36.0)
MCV: 84.6 fL (ref 80.0–100.0)
Monocytes Absolute: 0.2 K/uL (ref 0.1–1.0)
Monocytes Relative: 5 %
Neutro Abs: 3.5 K/uL (ref 1.7–7.7)
Neutrophils Relative %: 76 %
Platelets: 311 K/uL (ref 150–400)
RBC: 3.82 MIL/uL — ABNORMAL LOW (ref 3.87–5.11)
RDW: 13 % (ref 11.5–15.5)
WBC: 4.6 K/uL (ref 4.0–10.5)
nRBC: 0 % (ref 0.0–0.2)

## 2024-02-29 LAB — URINALYSIS, ROUTINE W REFLEX MICROSCOPIC
Bacteria, UA: NONE SEEN
Bilirubin Urine: NEGATIVE
Glucose, UA: NEGATIVE mg/dL
Ketones, ur: NEGATIVE mg/dL
Nitrite: NEGATIVE
RBC / HPF: >50 RBC/hpf (ref 0–5)
Specific Gravity, Urine: 1.025 (ref 1.005–1.030)
pH: 6.5 (ref 5.0–8.0)

## 2024-02-29 LAB — HCG, SERUM, QUALITATIVE: Preg, Serum: NEGATIVE

## 2024-02-29 MED ORDER — OXYCODONE-ACETAMINOPHEN 5-325 MG PO TABS
1.0000 | ORAL_TABLET | Freq: Once | ORAL | Status: AC
  Administered 2024-02-29: 1 via ORAL
  Filled 2024-02-29: qty 1

## 2024-02-29 MED ORDER — ONDANSETRON HCL 4 MG/2ML IJ SOLN
4.0000 mg | Freq: Once | INTRAMUSCULAR | Status: AC
  Administered 2024-02-29: 4 mg via INTRAVENOUS
  Filled 2024-02-29: qty 2

## 2024-02-29 MED ORDER — KETOROLAC TROMETHAMINE 15 MG/ML IJ SOLN
15.0000 mg | Freq: Once | INTRAMUSCULAR | Status: AC
  Administered 2024-02-29: 15 mg via INTRAMUSCULAR
  Filled 2024-02-29: qty 1

## 2024-02-29 NOTE — Discharge Instructions (Signed)
 You were evaluated in the emergency room for vaginal bleeding abdominal pain.  Your workup did not show any significant abnormality.  Please follow-up with your OB/GYN for further evaluation.  If you experience any new or worsening symptoms please return emergency room.

## 2024-02-29 NOTE — ED Notes (Signed)
 Reviewed discharge instructions, medications, and home care with pt. Pt verbalized understanding and had no further questions. Pt exited ED without complications.

## 2024-02-29 NOTE — ED Notes (Signed)
 ED Provider at bedside.

## 2024-02-29 NOTE — ED Notes (Signed)
Pt refused to wear gown.   

## 2024-02-29 NOTE — ED Provider Notes (Signed)
 [place] EMERGENCY DEPARTMENT AT [facility] Provider Note   CSN: [id number] Arrival date & time: 02/29/24  1228     Patient presents with: Vaginal Bleeding   Wanda Nixon is a 33 y.o. female with history of endometriosis status post salpingectomy presents with persistent vaginal bleeding and pelvic pain.  Patient states that her pain is very consistent with her typical recurrent endometriosis.  Reports that her symptoms have been ongoing for months now.  Denies any new symptoms.  Not associate with any vomiting, diarrhea or urinary symptoms.  Pain is constant.  Goes through multiple pads a day.  Has followed with OB, recommended for total hysterectomy.  She just received a another referral to pursue a second opinion.  She is here requesting something for pain.    Vaginal Bleeding  Past Medical History:  Diagnosis Date   Asthma    Chronic constipation    Depression    Eczema    GAD (generalized anxiety disorder)    History of anemia    History of migraine    Wears glasses    Wears partial dentures    upper   Past Surgical History:  Procedure Laterality Date   LAPAROSCOPIC BILATERAL SALPINGECTOMY Bilateral 09/20/2021   Procedure: LAPAROSCOPIC BILATERAL SALPINGECTOMY;  Surgeon: Armond Cape, MD;  Location: [facility];  Service: Gynecology;  Laterality: Bilateral;   NO PAST SURGERIES     WISDOM TOOTH EXTRACTION         Prior to Admission medications   Medication Sig Start Date End Date Taking? Authorizing Provider  albuterol  (VENTOLIN  HFA) 108 (90 Base) MCG/ACT inhaler Inhale 2 puffs into the lungs every 6 (six) hours as needed for wheezing or shortness of breath. 02/06/24   Lafe Domino, DO  citalopram  (CELEXA ) 20 MG tablet TAKE 1 TABLET (20 MG TOTAL) BY MOUTH DAILY. NEED PCP FOLLOW UP. 12/18/23   Adele Song, MD  cyclobenzaprine  (FLEXERIL ) 10 MG tablet Take 1 tablet (10 mg total) by mouth 2 (two) times daily as needed for muscle spasms.  01/26/23   Victor Lynwood DASEN, PA-C  ibuprofen  (ADVIL ) 800 MG tablet Take 1 tablet (800 mg total) by mouth 3 (three) times daily. 10/22/22   Mesner, Selinda, MD  lidocaine  (LIDODERM ) 5 % Place 1 patch onto the skin daily. Remove & Discard patch within 12 hours or as directed by MD 01/26/23   Victor Lynwood DASEN, PA-C  meclizine  (ANTIVERT ) 25 MG tablet Take 1 tablet (25 mg total) by mouth 3 (three) times daily as needed. 01/23/23   Clark, Meghan R, PA-C  naproxen  (NAPROSYN ) 500 MG tablet Take 1 tablet (500 mg total) by mouth 2 (two) times daily. 01/10/24   Aberman, Caroline C, PA-C  norgestimate -ethinyl estradiol  (SPRINTEC 28) 0.25-35 MG-MCG tablet Take 1 tablet by mouth daily. 02/09/24   Lafe Domino, DO  ondansetron  (ZOFRAN ) 4 MG tablet Take 1 tablet (4 mg total) by mouth every 8 (eight) hours as needed for nausea or vomiting. 11/24/23   Bauer, Collin S, PA-C  ondansetron  (ZOFRAN ) 4 MG tablet Take 1 tablet (4 mg total) by mouth every 8 (eight) hours as needed for up to 12 doses for nausea or vomiting. 12/12/23   Hoy Nidia FALCON, PA-C  ondansetron  (ZOFRAN -ODT) 4 MG disintegrating tablet Take 1 tablet (4 mg total) by mouth every 8 (eight) hours as needed. 12/28/23   Curatolo, Adam, DO  oxyCODONE  (OXYCONTIN ) 15 mg 12 hr tablet Take 1 tablet (15 mg total) by mouth every 12 (twelve) hours.  02/09/24   Lafe Domino, DO  senna-docusate (SENOKOT-S) 8.6-50 MG tablet Take 1 tablet by mouth daily. 09/25/22   Kingsley, Victoria K, DO  triamcinolone  cream (KENALOG ) 0.1 % APPLY TO AFFECTED AREA TWICE A DAY 08/27/22   Malvina Ellen, MD  Vitamin D , Ergocalciferol , (DRISDOL ) 1.25 MG (50000 UNIT) CAPS capsule TAKE 1 CAPSULE BY MOUTH EVERY 7 DAYS. WHEN YOU RUN OUT OF THESE 12 TABLETS, PLEASE RETURN TO CLINIC FOR VIT. D LEVEL RECHECK. 08/23/22   Malvina Ellen, MD    Allergies: Tomato, Augmentin  [amoxicillin -pot clavulanate], Mushroom extract complex (obsolete), and Reglan  [metoclopramide ]    Review of Systems  Genitourinary:   Positive for vaginal bleeding.    Updated Vital Signs BP 137/89   Pulse 79   Temp (!) 97.5 F (36.4 C) (Temporal)   Resp 16   SpO2 100%   Physical Exam  (all labs ordered are listed, but only abnormal results are displayed) Labs Reviewed  CBC WITH DIFFERENTIAL/PLATELET - Abnormal; Notable for the following components:      Result Value   RBC 3.82 (*)    Hemoglobin 11.2 (*)    HCT 32.3 (*)    All other components within normal limits  URINALYSIS, ROUTINE W REFLEX MICROSCOPIC - Abnormal; Notable for the following components:   Hgb urine dipstick LARGE (*)    Protein, ur TRACE (*)    Leukocytes,Ua SMALL (*)    All other components within normal limits  BASIC METABOLIC PANEL WITH GFR  HCG, SERUM, QUALITATIVE    EKG: None  Radiology: No results found.   Procedures   Medications Ordered in the ED  ketorolac  (TORADOL ) 15 MG/ML injection 15 mg (has no administration in time range)  ondansetron  (ZOFRAN ) injection 4 mg (has no administration in time range)  oxyCODONE -acetaminophen  (PERCOCET/ROXICET) 5-325 MG per tablet 1 tablet (has no administration in time range)    Clinical Course as of 02/29/24 1611  Thu Feb 29, 2024  1536 Patient with history of endometriosis evaluated for complaints of persistent suprapubic abdominal pain with associated vaginal bleeding.  Upon arrival she is hemodynamically stable.  She has generalized suprapubic abdominal tenderness.  Her symptoms are very consistent with her typical pain.  Her hemoglobin is stable.  She has no leukocytosis.  She is not interested in any repeat imaging.  Chart reviewed which demonstrates numerous ultrasounds and CTs in the past.  Given that her symptoms are consistent with prior episodes and her reassuring lab work I feel that this is reasonable.  Will provide a shot of Toradol  and her requested Percocet here in the ED and have her follow-up outpatient. [JT]  1609 Urinalysis, Routine w reflex microscopic -Urine, Clean  Catch(!) Small amount of leukocytes with negative nitrates, 6-10 WBCs and rare bacteria.  With no urinary symptoms would not treat. [JT]    Clinical Course User Index [JT] Donnajean Lynwood DEL, PA-C                                 Medical Decision Making Amount and/or Complexity of Data Reviewed Labs: ordered.   This patient presents to the ED with chief complaint(s) of vaginal bleeding.  The complaint involves an extensive differential diagnosis and also carries with it a high risk of complications and morbidity.   Pertinent past medical history as listed in HPI  The differential diagnosis includes  She is afebrile without leukocytosis and generalized suprapubic pain.  Lower suspicion for appendicitis.  Given chronic symptoms.  Do not suspect ovarian torsion or tubo-ovarian abscess.  UA without any evidence of UTI.  No abnormal discharge.  Do not suspect PID or STI.  Additional history obtained: Records reviewed Care Everywhere/External Records  Disposition:   Patient will be discharged home. The patient has been appropriately medically screened and/or stabilized in the ED. I have low suspicion for any other emergent medical condition which would require further screening, evaluation or treatment in the ED or require inpatient management. At time of discharge the patient is hemodynamically stable and in no acute distress. I have discussed work-up results and diagnosis with patient and answered all questions. Patient is agreeable with discharge plan. We discussed strict return precautions for returning to the emergency department and they verbalized understanding.     Social Determinants of Health:   none  This note was dictated with voice recognition software.  Despite best efforts at proofreading, errors may have occurred which can change the documentation meaning.       Final diagnoses:  Vaginal bleeding    ED Discharge Orders     None          Donnajean Lynwood VEAR DEVONNA 02/29/24 1611    Rogelia Jerilynn RAMAN, MD 03/03/24 [phone]

## 2024-02-29 NOTE — ED Triage Notes (Signed)
 Reports taking 15mg  Oxycontin  and says it has not helped it. Took it at 1030

## 2024-02-29 NOTE — ED Triage Notes (Signed)
 Patient reports severe pelvic pain and vaginal bleeding for a month. Reports history of endometriosis. Says it was under control with birth control but now she is no longer feeling relief or able to get relief.

## 2024-03-01 ENCOUNTER — Telehealth: Payer: Self-pay

## 2024-03-01 ENCOUNTER — Other Ambulatory Visit: Payer: Self-pay | Admitting: Family Medicine

## 2024-03-01 MED ORDER — OXYCODONE HCL ER 15 MG PO T12A: 15.0000 mg | EXTENDED_RELEASE_TABLET | Freq: Two times a day (BID) | ORAL | 0 refills | Status: DC | PRN

## 2024-03-01 NOTE — Telephone Encounter (Signed)
 Patient advised of medication to pharmacy.

## 2024-03-01 NOTE — Telephone Encounter (Signed)
 Patient calls nurse line in regards to OxyContin  prescription.   She reports she picked up the original prescription on 10/10, however was only given #37 tablets due to a stocking issue.   She reports she was told PCP would need to send in another prescription for the remaining #23 tablets since it's a controlled substance.   [facility] called [facility] [address] and confirmed [facility].  However[facility]  in [facility]. She [facility] is [employer].   Confirmed with [facility]   Will forward to PCP to send in the remaining #23 tablets.   Patient does report she LVM at [facility] for apt request.

## 2024-03-06 ENCOUNTER — Other Ambulatory Visit: Payer: Self-pay | Admitting: Family Medicine

## 2024-03-06 DIAGNOSIS — L309 Dermatitis, unspecified: Secondary | ICD-10-CM

## 2024-03-06 MED ORDER — TRIAMCINOLONE ACETONIDE 0.1 % EX CREA: TOPICAL_CREAM | Freq: Two times a day (BID) | CUTANEOUS | 2 refills | Status: AC

## 2024-03-07 DIAGNOSIS — F331 Major depressive disorder, recurrent, moderate: Secondary | ICD-10-CM | POA: Diagnosis not present

## 2024-03-14 ENCOUNTER — Emergency Department (HOSPITAL_BASED_OUTPATIENT_CLINIC_OR_DEPARTMENT_OTHER)
Admission: EM | Admit: 2024-03-14 | Discharge: 2024-03-14 | Disposition: A | Discharge status: Home / Self Care | Source: Ambulatory Visit | Attending: Emergency Medicine | Admitting: Emergency Medicine

## 2024-03-14 ENCOUNTER — Telehealth: Payer: Self-pay

## 2024-03-14 ENCOUNTER — Other Ambulatory Visit: Payer: Self-pay

## 2024-03-14 ENCOUNTER — Encounter (HOSPITAL_BASED_OUTPATIENT_CLINIC_OR_DEPARTMENT_OTHER): Payer: Self-pay | Admitting: Emergency Medicine

## 2024-03-14 ENCOUNTER — Other Ambulatory Visit (HOSPITAL_BASED_OUTPATIENT_CLINIC_OR_DEPARTMENT_OTHER): Payer: Self-pay

## 2024-03-14 DIAGNOSIS — R102 Pelvic and perineal pain unspecified side: Secondary | ICD-10-CM | POA: Diagnosis not present

## 2024-03-14 DIAGNOSIS — D72819 Decreased white blood cell count, unspecified: Secondary | ICD-10-CM | POA: Diagnosis not present

## 2024-03-14 LAB — CBC WITH DIFFERENTIAL/PLATELET
Abs Immature Granulocytes: 0.01 K/uL (ref 0.00–0.07)
Basophils Absolute: 0 K/uL (ref 0.0–0.1)
Basophils Relative: 1 %
Eosinophils Absolute: 0.1 K/uL (ref 0.0–0.5)
Eosinophils Relative: 2 %
HCT: 35.5 % — ABNORMAL LOW (ref 36.0–46.0)
Hemoglobin: 12.1 g/dL (ref 12.0–15.0)
Immature Granulocytes: 0 %
Lymphocytes Relative: 31 %
Lymphs Abs: 1.1 K/uL (ref 0.7–4.0)
MCH: 28.7 pg (ref 26.0–34.0)
MCHC: 34.1 g/dL (ref 30.0–36.0)
MCV: 84.1 fL (ref 80.0–100.0)
Monocytes Absolute: 0.2 K/uL (ref 0.1–1.0)
Monocytes Relative: 7 %
Neutro Abs: 2.1 K/uL (ref 1.7–7.7)
Neutrophils Relative %: 59 %
Platelets: 316 K/uL (ref 150–400)
RBC: 4.22 MIL/uL (ref 3.87–5.11)
RDW: 12.7 % (ref 11.5–15.5)
WBC: 3.5 K/uL — ABNORMAL LOW (ref 4.0–10.5)
nRBC: 0 % (ref 0.0–0.2)

## 2024-03-14 LAB — PREGNANCY, URINE: Preg Test, Ur: NEGATIVE

## 2024-03-14 MED ORDER — HYDROCODONE-ACETAMINOPHEN 5-325 MG PO TABS
1.0000 | ORAL_TABLET | Freq: Once | ORAL | Status: AC
  Administered 2024-03-14: 1 via ORAL
  Filled 2024-03-14: qty 1

## 2024-03-14 MED ORDER — MORPHINE SULFATE (PF) 4 MG/ML IV SOLN: 4.0000 mg | Freq: Once | INTRAVENOUS | Status: DC

## 2024-03-14 MED ORDER — MORPHINE SULFATE (PF) 4 MG/ML IV SOLN
4.0000 mg | Freq: Once | INTRAVENOUS | Status: DC
  Filled 2024-03-14: qty 1

## 2024-03-14 MED ORDER — KETOROLAC TROMETHAMINE 15 MG/ML IJ SOLN
15.0000 mg | Freq: Once | INTRAMUSCULAR | Status: AC
  Administered 2024-03-14: 15 mg via INTRAVENOUS
  Filled 2024-03-14: qty 1

## 2024-03-14 MED ORDER — HYDROCODONE-ACETAMINOPHEN 5-325 MG PO TABS
1.0000 | ORAL_TABLET | ORAL | 0 refills | Status: DC | PRN
  Filled 2024-03-14: qty 10, 2d supply, fill #0

## 2024-03-14 NOTE — ED Notes (Signed)
 blood work collected in triage, sent to lab

## 2024-03-14 NOTE — ED Triage Notes (Signed)
 Pt c/o excruciating pelvic pain and feeling weak x 4 days. Hx of endometriosis.

## 2024-03-14 NOTE — ED Provider Notes (Signed)
[place] EMERGENCY DEPARTMENT AT [facility] Provider Note   CSN: [id number] Arrival date & time: 03/14/24  1112     Patient presents with: Pelvic Pain   Wanda Nixon is a 33 y.o. female.   Longstanding history of endometriosis and pelvic pain currently being worked up by her PCP at the family medicine office upcoming referral to OB/GYN on 1217 presenting with worsening abdominal and pelvic pain related to her endometriosis.  She reports that pain typically gets worse closer to the day of her menstrual period she is due to start in the next week.  The pain has been interfering with her sleep and she has not been able to sleep more than 3 hours over the last 2 nights due to the pain.  She does take long acting OxyContin  at baseline up to 2 times a day which has helped with controlling her pain but it has not made a dent in her pain over the last several days.  Currently she denies any bleeding, abnormal vaginal discharge, nausea/vomiting.  She has had sexual intercourse as her last menstrual period and is not currently on birth control due to adverse reaction.  CBC unremarkable, urine pregnancy test negative.  Minimal improvement with 15 mg Toradol , patient requesting home dose of Percocet, will administer and reevaluate.   Patient improved, stable for discharge. Follow up scheduled with PCP office on Monday   Pelvic Pain      Prior to Admission medications   Medication Sig Start Date End Date Taking? Authorizing Provider  albuterol  (VENTOLIN  HFA) 108 (90 Base) MCG/ACT inhaler Inhale 2 puffs into the lungs every 6 (six) hours as needed for wheezing or shortness of breath. 02/06/24   Lafe Domino, DO  citalopram  (CELEXA ) 20 MG tablet TAKE 1 TABLET (20 MG TOTAL) BY MOUTH DAILY. NEED PCP FOLLOW UP. 12/18/23   Adele Song, MD  cyclobenzaprine  (FLEXERIL ) 10 MG tablet Take 1 tablet (10 mg total) by mouth 2 (two) times daily as needed for muscle spasms. 01/26/23   Victor Lynwood DASEN, PA-C  ibuprofen  (ADVIL ) 800 MG tablet Take 1 tablet (800 mg total) by mouth 3 (three) times daily. 10/22/22   Mesner, Selinda, MD  lidocaine  (LIDODERM ) 5 % Place 1 patch onto the skin daily. Remove & Discard patch within 12 hours or as directed by MD 01/26/23   Victor Lynwood DASEN, PA-C  meclizine  (ANTIVERT ) 25 MG tablet Take 1 tablet (25 mg total) by mouth 3 (three) times daily as needed. 01/23/23   Clark, Meghan R, PA-C  naproxen  (NAPROSYN ) 500 MG tablet Take 1 tablet (500 mg total) by mouth 2 (two) times daily. 01/10/24   Aberman, Caroline C, PA-C  norgestimate -ethinyl estradiol  (SPRINTEC 28) 0.25-35 MG-MCG tablet Take 1 tablet by mouth daily. 02/09/24   Lafe Domino, DO  ondansetron  (ZOFRAN ) 4 MG tablet Take 1 tablet (4 mg total) by mouth every 8 (eight) hours as needed for nausea or vomiting. 11/24/23   Bauer, Collin S, PA-C  ondansetron  (ZOFRAN ) 4 MG tablet Take 1 tablet (4 mg total) by mouth every 8 (eight) hours as needed for up to 12 doses for nausea or vomiting. 12/12/23   Hoy Nidia FALCON, PA-C  ondansetron  (ZOFRAN -ODT) 4 MG disintegrating tablet Take 1 tablet (4 mg total) by mouth every 8 (eight) hours as needed. 12/28/23   Curatolo, Adam, DO  oxyCODONE  (OXYCONTIN ) 15 mg 12 hr tablet Take 1 tablet (15 mg total) by mouth every 12 (twelve) hours. 02/09/24   Lafe Domino, DO  oxyCODONE  (OXYCONTIN ) 15 mg 12 hr tablet Take 1 tablet (15 mg total) by mouth every 12 (twelve) hours as needed (for endometriosis related pelvic pain. Follow up with OBGYN as soon as possible.). 03/01/24   Lafe Domino, DO  senna-docusate (SENOKOT-S) 8.6-50 MG tablet Take 1 tablet by mouth daily. 09/25/22   Kingsley, Victoria K, DO  triamcinolone  cream (KENALOG ) 0.1 % Apply topically 2 (two) times daily. 03/06/24   Lafe Domino, DO  Vitamin D , Ergocalciferol , (DRISDOL ) 1.25 MG (50000 UNIT) CAPS capsule TAKE 1 CAPSULE BY MOUTH EVERY 7 DAYS. WHEN YOU RUN OUT OF THESE 12 TABLETS, PLEASE RETURN TO CLINIC FOR VIT. D LEVEL  RECHECK. 08/23/22   Malvina Ellen, MD    Allergies: Tomato, Augmentin  [amoxicillin -pot clavulanate], Mushroom extract complex (obsolete), and Reglan  [metoclopramide ]    Review of Systems  Genitourinary:  Positive for pelvic pain.    Updated Vital Signs BP 119/85   Pulse 89   Temp 98.5 F (36.9 C)   Resp 16   Wt 56.7 kg   LMP 02/03/2024   SpO2 99%   BMI 25.25 kg/m   Physical Exam Constitutional:      Appearance: Normal appearance.  Cardiovascular:     Rate and Rhythm: Normal rate and regular rhythm.  Pulmonary:     Effort: Pulmonary effort is normal.     Breath sounds: Normal breath sounds.  Abdominal:     General: Abdomen is flat.     Palpations: Abdomen is soft.     Tenderness: There is abdominal tenderness.  Musculoskeletal:        General: Normal range of motion.     Cervical back: Normal range of motion and neck supple.  Skin:    General: Skin is warm and dry.  Neurological:     Mental Status: She is alert.     (all labs ordered are listed, but only abnormal results are displayed) Labs Reviewed  CBC WITH DIFFERENTIAL/PLATELET  PREGNANCY, URINE    EKG: None  Radiology: No results found.   Procedures   Medications Ordered in the ED  ketorolac  (TORADOL ) 15 MG/ML injection 15 mg (has no administration in time range)                                    Medical Decision Making This is an otherwise healthy 33 year old female with an extensive history of pelvic pain due to endometriosis.  Given that she has had unprotected intercourse over the last month we will obtain urine pregnancy test to rule out the possibility of an ectopic pregnancy causing her pain at this time.  I have low suspicion for this given she has had a bilateral tubal ligation and given the severity of her symptoms rule out is prudent.  Will also obtain a CBC to assess for anemia and give shot of Toradol  for pain control.  Her abdomen is diffusely tender and there is no focal component  or rebound tenderness which lowers my suspicion for ovarian torsion or other intra-abdominal process.  Given the patient is also family medicine patient will schedule her in my clinic Monday morning for follow-up.  Amount and/or Complexity of Data Reviewed Labs: ordered.  Risk Prescription drug management.    Final diagnoses:  None    ED Discharge Orders     None          Cleotilde Lukes, DO 03/14/24 1449    Emil Share, DO  03/15/24 0657  

## 2024-03-14 NOTE — Telephone Encounter (Signed)
 Patient calls nurse line in regards to pain and referral.   She reports she was just informed yesterday her referral to [facility] was denied. Per notes, she was dismissed from the practice. She reports she was referred to [facility] and reports she has her first apt on 12/17.  She reports she is due for her cycle and is in continued pain. She reports she has been taking one pill in the mornings and one pill at night to maintain her pain levels.   She reports she is excruciating pain and reports she was up all night with abdominal pain and headaches.   Advised she was need an apt for continued opioid management. She reports she tried that, however Lafe does not have any apts until 11/25. She reports she can not wait that long.   She reports she is going to the ED today.   Advised will forward to PCP.

## 2024-03-14 NOTE — ED Notes (Signed)
 Reviewed AVS/discharge instructions with patient. Time allotted for and all questions answered. Patient is agreeable for d/c and escorted to ED exit by staff.

## 2024-03-18 ENCOUNTER — Ambulatory Visit: Payer: Self-pay | Admitting: Family Medicine

## 2024-03-18 VITALS — BP 114/84 | HR 77 | Ht 59.0 in | Wt 115.6 lb

## 2024-03-18 DIAGNOSIS — R102 Pelvic and perineal pain unspecified side: Secondary | ICD-10-CM | POA: Diagnosis not present

## 2024-03-18 MED ORDER — OXYCODONE HCL ER 15 MG PO T12A: 15.0000 mg | EXTENDED_RELEASE_TABLET | Freq: Two times a day (BID) | ORAL | 0 refills | Status: DC | PRN

## 2024-03-18 MED ORDER — OXYCODONE HCL ER 15 MG PO T12A
15.0000 mg | EXTENDED_RELEASE_TABLET | Freq: Two times a day (BID) | ORAL | 0 refills | Status: DC | PRN
Start: 2024-03-18 — End: 2024-03-26

## 2024-03-18 NOTE — Assessment & Plan Note (Signed)
-   Refilled long acting OxyContin  15 mg, 20 tablets -Scheduled follow-up with Dr. Lafe for next Tuesday for ongoing management -Return and ED precautions discussed

## 2024-03-18 NOTE — Patient Instructions (Signed)
 It was wonderful to see you today!  I am giving a 10-day supply of your long-acting opiate and scheduled you an appointment with Dr. Lafe to follow-up next week.  This appointment should occur before you run out of this medicine so that if you need a refill you can get 1 at that time.  Please continue to use supportive care measures as you can, and if anything changes your pain gets worse or you start to have irregular bleeding, please feel free to return to the emergency department.  Please call [phone] with any questions about today's appointment.   If you need any additional refills, please call your pharmacy before calling the office.  Lucie Pinal, DO Family Medicine

## 2024-03-18 NOTE — Progress Notes (Signed)
    SUBJECTIVE:   CHIEF COMPLAINT / HPI:   ED follow up. Endometriosis pain worse, needs refill of long acting opiate. Seen by me at [facility] ED on 11/13- short course of Norco/Vicodin given for bridge until she could be seen.   Previously unable to tolerate OCP therapy, could consider progestin only therapy or GnRH therapy though this would be more appropriately managed by Gyn. Upcoming appointment with Gyn in December.   Norco Vicodin did not provide significant relief over the weekend, patient was able to manage her symptoms somewhat with what was left of her long-acting opiate supportive care using heat and increased rest.  PERTINENT  PMH / PSH: Endometriosis, migraine  OBJECTIVE:   BP 114/84   Pulse 77   Ht 4' 11 (1.499 m)   Wt 115 lb 9.6 oz (52.4 kg)   LMP 02/03/2024   SpO2 100%   BMI 23.35 kg/m   General: Alert, NAD.  Patient does appear fatigued. Cardiac: RRR, no M/R/G Respiratory: No increased work of breathing  ASSESSMENT/PLAN:   Assessment & Plan Pelvic pain - Refilled long acting OxyContin  15 mg, 20 tablets -Scheduled follow-up with Dr. Lafe for next Tuesday for ongoing management -Return and ED precautions discussed   Lucie Pinal, DO [facility]

## 2024-03-19 DIAGNOSIS — F331 Major depressive disorder, recurrent, moderate: Secondary | ICD-10-CM | POA: Diagnosis not present

## 2024-03-22 ENCOUNTER — Ambulatory Visit: Payer: Self-pay | Admitting: Student

## 2024-03-22 ENCOUNTER — Ambulatory Visit (INDEPENDENT_AMBULATORY_CARE_PROVIDER_SITE_OTHER): Admitting: Student

## 2024-03-22 ENCOUNTER — Encounter: Payer: Self-pay | Admitting: Family Medicine

## 2024-03-22 VITALS — BP 115/83 | HR 93 | Ht 59.0 in | Wt 115.4 lb

## 2024-03-22 DIAGNOSIS — R102 Pelvic and perineal pain unspecified side: Secondary | ICD-10-CM

## 2024-03-22 LAB — POCT URINE PREGNANCY: Preg Test, Ur: NEGATIVE

## 2024-03-22 MED ORDER — OXYCODONE HCL 5 MG PO TABS: 5.0000 mg | ORAL_TABLET | Freq: Four times a day (QID) | ORAL | 0 refills | Status: DC | PRN

## 2024-03-22 NOTE — Assessment & Plan Note (Signed)
-  UPT negative -Chronic, hx of endometriosis  -stable vitals, in pain but overall well appearing -Continue current OxyContin   -5 mg Roxicodone  (5 tablet total) every 6 hrs PRN for break through -Add tylenol  1000 mg q6hrs  -Follow-up next week -Follow-up with GYN in December

## 2024-03-22 NOTE — Patient Instructions (Addendum)
 It was great to see you! Thank you for allowing me to participate in your care!   I recommend that you always bring your medications to each appointment as this makes it easy to ensure we are on the correct medications and helps us  not miss when refills are needed.  Our plans for today:  - You can take 5 mg of Roxicodone  every 6 hours as needed for break through pain. Please wait ~6 hours after your Oxycontin  before taking, and only take for break through. Additionally, you can take the OxyContin  every 12 hours. - Please take 1000 mg of tylenol  every 6 hours for additional pain control - You can take 600-800 mg of ibuprofen  every 6-8 hours as well - Keep your appointment with Dr. Lafe if needed  Take care and seek immediate care sooner if you develop any concerns. Please remember to show up 15 minutes before your scheduled appointment time!  Gladis Church, DO [facility]

## 2024-03-22 NOTE — Progress Notes (Signed)
    SUBJECTIVE:   CHIEF COMPLAINT / HPI:   Discussed the use of AI scribe software for clinical note transcription with the patient, who gave verbal consent to proceed.  History of Present Illness Wanda Nixon is a 33 year old female with endometriosis who presents with inadequate pain control.  Abdominal pain associated with endometriosis - Persistent lower abdominal pain attributed to endometriosis - Pain intensity returns to 8-9/10 after OxyContin  wears off - OxyContin  provides relief for approximately six hours - Ibuprofen  600 mg taken every four to six hours for supplemental pain control; higher doses cause stomach upset - Recent Toradol  injection in the emergency department was ineffective  Menstrual irregularities and gynecologic symptoms - No vaginal bleeding or discharge since last month - Prolonged bleeding last month attributed to use of birth control pill - Previous IUD use resulted in similar bleeding issues - No menses this month; typically expects period in the first week of the month  Sexual and reproductive health - Last pregnancy test was negative, performed last Friday - No sexual activity since last pregnancy test - Hx of bilateral salpingectomy   OBJECTIVE:   BP 115/83   Pulse 93   Ht 4' 11 (1.499 m)   Wt 115 lb 6.4 oz (52.3 kg)   LMP 02/03/2024   SpO2 100%   BMI 23.31 kg/m    General: NAD, pleasant  Cardio: RRR, no MRG. Respiratory: CTAB, normal wob on RA GI: Abdomen is soft, not distended. TTP in bilateral lower quadrants. BS present Skin: Warm and dry  ASSESSMENT/PLAN:   Assessment & Plan Pelvic pain -UPT negative -Chronic, hx of endometriosis  -stable vitals, in pain but overall well appearing -Continue current OxyContin   -5 mg Roxicodone  (5 tablet total) every 6 hrs PRN for break through -Add tylenol  1000 mg q6hrs  -Follow-up next week -Follow-up with GYN in December  Gladis Church, DO [facility]

## 2024-03-25 DIAGNOSIS — F331 Major depressive disorder, recurrent, moderate: Secondary | ICD-10-CM | POA: Diagnosis not present

## 2024-03-26 ENCOUNTER — Encounter: Payer: Self-pay | Admitting: Family Medicine

## 2024-03-26 ENCOUNTER — Ambulatory Visit (INDEPENDENT_AMBULATORY_CARE_PROVIDER_SITE_OTHER): Payer: Self-pay | Admitting: Family Medicine

## 2024-03-26 VITALS — BP 114/81 | HR 98 | Ht 59.0 in | Wt 115.6 lb

## 2024-03-26 DIAGNOSIS — F32A Depression, unspecified: Secondary | ICD-10-CM

## 2024-03-26 DIAGNOSIS — N809 Endometriosis, unspecified: Secondary | ICD-10-CM

## 2024-03-26 DIAGNOSIS — R102 Pelvic and perineal pain unspecified side: Secondary | ICD-10-CM

## 2024-03-26 MED ORDER — OXYCODONE HCL 5 MG PO TABS: 5.0000 mg | ORAL_TABLET | Freq: Four times a day (QID) | ORAL | 0 refills | Status: DC | PRN

## 2024-03-26 MED ORDER — OXYCODONE HCL ER 15 MG PO T12A
15.0000 mg | EXTENDED_RELEASE_TABLET | Freq: Two times a day (BID) | ORAL | 0 refills | Status: AC | PRN
Start: 2024-03-26 — End: ?

## 2024-03-26 NOTE — Patient Instructions (Signed)
 It was so good to see you today! Thank you for allowing me to take care of you.  Today we discussed the following concerns and plans:  Refills sent to pharmacy today. Follow up after your OBGYN appointment as needed.  If you have any concerns, please call the clinic or schedule an appointment.  It was a pleasure to take care of you today. Be well!  Lauraine Norse, DO [facility], PGY-2  Do you need your medications delivered to your home?   We'll send your prescription to the [facility] for delivery.          Address: [address] [place], [place], [place] [address]          Phone: [phone]  Please call the [facility] to speak with a pharmacist and set up your home medication delivery. If you have any questions, feel free to contact us  -- we're happy to help!  Other [place] that offer affordable prices on both prescriptions and over-the-counter items, as well as convenient services like vaccinations, are  [facility], at [facility]         Address:  [address], [place], [place] [address]         Phone: [phone]  [facility], located in the [facility]        Address: [address] [place], [place] [address]        Phone: [phone]  [facility], at [facility]       Address: [address], [place], [place] [address]       Phone: [phone]  [facility], at [facility]       Address: [address], [place], [place] [address]       Phone: [phone]

## 2024-03-26 NOTE — Progress Notes (Addendum)
    SUBJECTIVE:   CHIEF COMPLAINT / HPI:   Chronic Pelvic Pain, endometriosis This has been a longstanding problem. She is scheduled to see OBGYN in December to discuss long term/definitive management. Has been taking oxycodone  15 mg extended release 1-2 times daily for pain management (~4 days/week). Seen in clinic last week 11/21 for severe pain. Tried Roxicodone  5 mg for breakthrough pain - only takes when absolutely needed.  Last period in Oct; had stopped [facility] after about 1 month because she was bleeding daily. Has not had a cycle since then. Having cramping like period is coming. Some spotting and bleeding.  She has started therapy and notes that her pain does seem somewhat improved. She is active at her job with lots of walking.  Depression She is interested in stopping her celexa  in the future - does not feel like it is doing much for her. Initially started for PPD after birth of son but then never discontinued.   PERTINENT  PMH / PSH: Reviewed.  OBJECTIVE:   BP 114/81   Pulse 98   Ht 4' 11 (1.499 m)   Wt 115 lb 9.6 oz (52.4 kg)   LMP 02/03/2024   SpO2 100%   BMI 23.35 kg/m   General: well-appearing, no acute distress. HEENT: normocephalic, PERRLA, EOM grossly intact, MMM Cardio: Regular rate, regular rhythm, no murmurs on exam. Pulm: Clear, no wheezing, no crackles. No increased work of breathing. Abdominal: bowel sounds present, soft, non-tender, non-distended.  Extremities: no peripheral edema. Moves all extremities equally. Neuro: Alert and oriented x3, speech normal in content, no facial asymmetry Psych:  Cognition and judgment appear intact.   ASSESSMENT/PLAN:   Assessment & Plan Endometriosis Chronic pain. Discussed with patient again that long term opioid pain management is not ideal; she has appt in 3 weeks with OBGYN to seek further options. Unfortunately she did not tolerated OCPs which may have helped symptoms. - short supply refill given of oxycodone   and Roxicodone  for breakthrough pain to get her to OBGYN appt - follow up with me as needed Depression, unspecified depression type Reasonable to trial discontinuing celexa . Discussed with patient and will plan to revisit in the Spring when her life has calmed a bit and hopefully endometriosis is under better control.     Lauraine Norse, DO [facility]

## 2024-04-03 ENCOUNTER — Emergency Department (HOSPITAL_BASED_OUTPATIENT_CLINIC_OR_DEPARTMENT_OTHER)
Admission: EM | Admit: 2024-04-03 | Discharge: 2024-04-03 | Disposition: A | Discharge status: Home / Self Care | Attending: Emergency Medicine | Admitting: Emergency Medicine

## 2024-04-03 ENCOUNTER — Other Ambulatory Visit: Payer: Self-pay

## 2024-04-03 DIAGNOSIS — Z79899 Other long term (current) drug therapy: Secondary | ICD-10-CM | POA: Insufficient documentation

## 2024-04-03 DIAGNOSIS — K047 Periapical abscess without sinus: Secondary | ICD-10-CM | POA: Insufficient documentation

## 2024-04-03 DIAGNOSIS — F331 Major depressive disorder, recurrent, moderate: Secondary | ICD-10-CM | POA: Diagnosis not present

## 2024-04-03 MED ORDER — OXYCODONE-ACETAMINOPHEN 5-325 MG PO TABS
1.0000 | ORAL_TABLET | Freq: Once | ORAL | Status: AC
  Administered 2024-04-03: 1 via ORAL
  Filled 2024-04-03: qty 1

## 2024-04-03 MED ORDER — KETOROLAC TROMETHAMINE 30 MG/ML IJ SOLN
30.0000 mg | Freq: Once | INTRAMUSCULAR | Status: AC
  Administered 2024-04-03: 30 mg via INTRAMUSCULAR
  Filled 2024-04-03: qty 1

## 2024-04-03 MED ORDER — AMOXICILLIN-POT CLAVULANATE 875-125 MG PO TABS: 1.0000 | ORAL_TABLET | Freq: Two times a day (BID) | ORAL | 0 refills | Status: AC

## 2024-04-03 MED ORDER — KETOROLAC TROMETHAMINE 10 MG PO TABS: 10.0000 mg | ORAL_TABLET | Freq: Four times a day (QID) | ORAL | 0 refills | Status: DC | PRN

## 2024-04-03 MED ORDER — HYDROCODONE-ACETAMINOPHEN 5-325 MG PO TABS: 2.0000 | ORAL_TABLET | ORAL | 0 refills | Status: DC | PRN

## 2024-04-03 NOTE — Discharge Instructions (Addendum)
 You were seen in the emergency department today for concerns of dental pain.  Based on my exam, I do suspect you likely have pain ongoing from cavities in this area but this is a likely developing infection.  You were started on antibiotics to try to help address this underlying infection but also sent home with 2 different pain medications.  Please reach out to your dentist for evaluation as you will likely require repair help to get this pain fully under control.  If you begin to experience significant facial swelling, fever, or difficulty swallowing, return to the emergency department.

## 2024-04-03 NOTE — ED Triage Notes (Incomplete)
 Reports left sided dental pain x 2 days. States she took antibiotics from dentist today but no relief.

## 2024-04-03 NOTE — ED Notes (Signed)
 Pt d/c instructions, medications, and follow-up care reviewed with pt. Pt verbalized understanding and had no further questions at time of d/c. Pt CA&Ox4, ambulatory, and in NAD at time of d/c

## 2024-04-03 NOTE — ED Provider Notes (Signed)
 [name] Provider Note   CSN: [id number] Arrival date & time: 04/03/24  8165     Patient presents with: Dental Pain   Wanda Nixon is a 33 y.o. female.  Patient without significant medical history presents to the emergency department with concerns of dental pain.  Reports that she woke up today with severe pain to the left lower jaw.  States that she has a bad tooth in this area with a cavity that also has a chip present.  She denies any drainage.  Has tried ibuprofen , naproxen , Tylenol , and Orajel without significant proved in pain.   Dental Pain      Prior to Admission medications   Medication Sig Start Date End Date Taking? Authorizing Provider  amoxicillin -clavulanate (AUGMENTIN ) 875-125 MG tablet Take 1 tablet by mouth every 12 (twelve) hours for 7 days. 04/03/24 04/10/24 Yes Saadia Dewitt A, PA-C  HYDROcodone -acetaminophen  (NORCO/VICODIN) 5-325 MG tablet Take 2 tablets by mouth every 4 (four) hours as needed. 04/03/24  Yes Amorie Rentz A, PA-C  ketorolac  (TORADOL ) 10 MG tablet Take 1 tablet (10 mg total) by mouth every 6 (six) hours as needed. 04/03/24  Yes Teighan Aubert A, PA-C  albuterol  (VENTOLIN  HFA) 108 (90 Base) MCG/ACT inhaler Inhale 2 puffs into the lungs every 6 (six) hours as needed for wheezing or shortness of breath. 02/06/24   Lafe Domino, DO  citalopram  (CELEXA ) 20 MG tablet TAKE 1 TABLET (20 MG TOTAL) BY MOUTH DAILY. NEED PCP FOLLOW UP. 12/18/23   Adele Song, MD  cyclobenzaprine  (FLEXERIL ) 10 MG tablet Take 1 tablet (10 mg total) by mouth 2 (two) times daily as needed for muscle spasms. 01/26/23   Victor Lynwood DASEN, PA-C  ibuprofen  (ADVIL ) 800 MG tablet Take 1 tablet (800 mg total) by mouth 3 (three) times daily. 10/22/22   Mesner, Selinda, MD  lidocaine  (LIDODERM ) 5 % Place 1 patch onto the skin daily. Remove & Discard patch within 12 hours or as directed by MD 01/26/23   Victor Lynwood DASEN, PA-C  meclizine  (ANTIVERT ) 25 MG  tablet Take 1 tablet (25 mg total) by mouth 3 (three) times daily as needed. 01/23/23   Clark, Meghan R, PA-C  naproxen  (NAPROSYN ) 500 MG tablet Take 1 tablet (500 mg total) by mouth 2 (two) times daily. 01/10/24   Aberman, Caroline C, PA-C  ondansetron  (ZOFRAN -ODT) 4 MG disintegrating tablet Take 1 tablet (4 mg total) by mouth every 8 (eight) hours as needed. 12/28/23   Curatolo, Adam, DO  oxyCODONE  (OXYCONTIN ) 15 mg 12 hr tablet Take 1 tablet (15 mg total) by mouth every 12 (twelve) hours as needed (for endometriosis related pelvic pain. Follow up with OBGYN as soon as possible.). 03/26/24   Lafe Domino, DO  oxyCODONE  (ROXICODONE ) 5 MG immediate release tablet Take 1 tablet (5 mg total) by mouth every 6 (six) hours as needed for breakthrough pain. 03/26/24   Lafe Domino, DO  senna-docusate (SENOKOT-S) 8.6-50 MG tablet Take 1 tablet by mouth daily. 09/25/22   Kingsley, Victoria K, DO  triamcinolone  cream (KENALOG ) 0.1 % Apply topically 2 (two) times daily. 03/06/24   Lafe Domino, DO  Vitamin D , Ergocalciferol , (DRISDOL ) 1.25 MG (50000 UNIT) CAPS capsule TAKE 1 CAPSULE BY MOUTH EVERY 7 DAYS. WHEN YOU RUN OUT OF THESE 12 TABLETS, PLEASE RETURN TO CLINIC FOR VIT. D LEVEL RECHECK. 08/23/22   Malvina Ellen, MD    Allergies: Tomato, Augmentin  [amoxicillin -pot clavulanate], Mushroom extract complex (obsolete), and Reglan  [metoclopramide ]  Review of Systems  HENT:  Positive for dental problem.   All other systems reviewed and are negative.   Updated Vital Signs BP (!) 130/95 (BP Location: Right Arm)   Pulse 84   Temp 98.7 F (37.1 C) (Oral)   Resp 18   LMP 02/03/2024   SpO2 100%   Physical Exam Vitals and nursing note reviewed.  Constitutional:      Appearance: She is normal weight.  HENT:     Head: Normocephalic and atraumatic.     Mouth/Throat:     Mouth: Mucous membranes are moist.     Pharynx: Posterior oropharyngeal erythema present. No oropharyngeal exudate.      Comments:  Multiple absent teeth with what appears to be 1st molar with notable caries, avulsion, and swelling and redness to the gumline in this area. No appreciable discharge or drainage. Neurological:     Mental Status: She is alert.     (all labs ordered are listed, but only abnormal results are displayed) Labs Reviewed - No data to display  EKG: None  Radiology: No results found.   Procedures   Medications Ordered in the ED  ketorolac  (TORADOL ) 30 MG/ML injection 30 mg (30 mg Intramuscular Given 04/03/24 1913)  oxyCODONE -acetaminophen  (PERCOCET/ROXICET) 5-325 MG per tablet 1 tablet (1 tablet Oral Given 04/03/24 1913)                                    Medical Decision Making Risk Prescription drug management.   This patient presents to the ED for concern of dental pain.  Differential diagnosis includes gingivitis, dental abscess, dental avulsion, dental caries    Additional history obtained:  Additional history obtained from chart review    Medicines ordered and prescription drug management:  I ordered medication including Percocet, Toradol  for pain Reevaluation of the patient after these medicines showed that the patient improved I have reviewed the patients home medicines and have made adjustments as needed   Problem List / ED Course:  Patient without significant medical history presents to the emergency department today with concerns of dental pain.  Reports that she woke up today with sudden severe dental pain on the left lower side.  She reports she has a tooth that she has.  I removed but has not been able to schedule with her dentist for management.  Denies any fever, swelling, or trismus.  Has not noticed any specific drainage coming from this area.  Denies any recent tooth injury as far she can recall. On exam, patient has visible dental caries to the left first molar of the left lower jaw.  There is no appreciable gum boil or fluid collection but the gingiva is  significantly erythematous in this area. Based on exam, I do suspect there is likely a dental infection that is developed on the gingiva of the right lower side.  Pain is likely worsened by dental caries with significant breakdown of the tooth with likely multiple insults to the enamel of this area.  Advised patient that she requires close follow-up with dentistry for further evaluation and management.  No acute indication to suggest dental abscess at this time.  Patient given a dose of Toradol  and Percocet.  Given severe pain and area of concern for infection, start patient on course of Augmentin , p.o. Toradol , and a short supply of Norco for the next 3 days.  Return precautions discussed such as concerns for  new or worsening symptoms.  Patient plans to call her dentist tomorrow to schedule follow-up.  Discharged home in stable condition.   Social Determinants of Health:  None  Final diagnoses:  Dental infection    ED Discharge Orders          Ordered    amoxicillin -clavulanate (AUGMENTIN ) 875-125 MG tablet  Every 12 hours        04/03/24 2001    ketorolac  (TORADOL ) 10 MG tablet  Every 6 hours PRN        04/03/24 2001    HYDROcodone -acetaminophen  (NORCO/VICODIN) 5-325 MG tablet  Every 4 hours PRN        04/03/24 2001               Frida Wahlstrom A, PA-C 04/04/24 2124    Towana Ozell BROCKS, MD 04/05/24 1047

## 2024-04-09 ENCOUNTER — Encounter: Payer: Self-pay | Admitting: Family Medicine

## 2024-04-09 DIAGNOSIS — F331 Major depressive disorder, recurrent, moderate: Secondary | ICD-10-CM | POA: Diagnosis not present

## 2024-04-10 NOTE — Telephone Encounter (Signed)
 Patient calls nurse line regarding this concern.   Patient is concerned about running out of medication as she only has two pills left.   Advised patient that I would forward message to PCP.   Wanda JAYSON English, RN

## 2024-04-11 ENCOUNTER — Ambulatory Visit (INDEPENDENT_AMBULATORY_CARE_PROVIDER_SITE_OTHER): Admitting: Family Medicine

## 2024-04-11 VITALS — BP 124/91 | HR 73 | Wt 119.6 lb

## 2024-04-11 DIAGNOSIS — R102 Pelvic and perineal pain unspecified side: Secondary | ICD-10-CM | POA: Diagnosis not present

## 2024-04-11 MED ORDER — OXYCODONE HCL ER 15 MG PO T12A: 15.0000 mg | EXTENDED_RELEASE_TABLET | Freq: Two times a day (BID) | ORAL | 0 refills | Status: DC | PRN

## 2024-04-11 NOTE — Patient Instructions (Signed)
 It was wonderful to see you today!  Today we refilled your prescription for oxycodone .  I have also ordered an MRI to further look into your pain and make sure that this is not something other than endometriosis.  Please keep your upcoming appointment with OB/GYN, and hopefully they will have some better solutions for you in the long run.  Please call [phone] with any questions about today's appointment.   If you need any additional refills, please call your pharmacy before calling the office.  Lucie Pinal, DO Family Medicine

## 2024-04-11 NOTE — Progress Notes (Signed)
° ° °  SUBJECTIVE:   CHIEF COMPLAINT / HPI:   Patient presents for management of endometriosis pain. She has upcoming appointment with OBGYN, but is still having significant pain and cramping. Previously she has been managed with opiates, but this is not ideal given the nature of her pain.   Her pain has not changed in character and is still primarily in her lower abdomen and pelvis.  She reports that it has gotten worse in intensity and she is now having trouble sleeping because she is unable to find a comfortable position.  PERTINENT  PMH / PSH: endometriosis  OBJECTIVE:   BP (!) 124/91   Pulse 73   Wt 119 lb 9.6 oz (54.3 kg)   LMP 02/03/2024   SpO2 100%   BMI 24.16 kg/m   General: fatigued appearing Respiration: Even, unlabored breathing Abdominal: Soft, nondistended.  Tender to palpation over the bilateral lower quadrants.  ASSESSMENT/PLAN:   Assessment & Plan Pelvic pain - This is a longstanding issue presumed to be secondary to endometriosis -Patient has upcoming appointment on 12/17 with OB/GYN -Short-term pain management plan is to refill her long-acting opiate -Given the increasing intensity of her pain and after discussion with patient and preceptor, I have ordered MRI of the pelvis to assess further -There is a concern that because that has been going on for so long that there may be some underlying pathology that has been missed given the presumed diagnosis   Lucie Pinal, DO [facility]

## 2024-04-11 NOTE — Assessment & Plan Note (Signed)
-   This is a longstanding issue presumed to be secondary to endometriosis -Patient has upcoming appointment on 12/17 with OB/GYN -Short-term pain management plan is to refill her long-acting opiate -Given the increasing intensity of her pain and after discussion with patient and preceptor, I have ordered MRI of the pelvis to assess further -There is a concern that because that has been going on for so long that there may be some underlying pathology that has been missed given the presumed diagnosis

## 2024-04-17 ENCOUNTER — Ambulatory Visit: Admitting: Obstetrics & Gynecology

## 2024-04-17 ENCOUNTER — Encounter: Payer: Self-pay | Admitting: Obstetrics & Gynecology

## 2024-04-17 VITALS — BP 121/71 | HR 84 | Ht 59.0 in | Wt 120.0 lb

## 2024-04-17 DIAGNOSIS — N809 Endometriosis, unspecified: Secondary | ICD-10-CM | POA: Diagnosis not present

## 2024-04-17 DIAGNOSIS — R102 Pelvic and perineal pain unspecified side: Secondary | ICD-10-CM

## 2024-04-17 MED ORDER — LEUPROLIDE ACETATE (3 MONTH) 11.25 MG IM KIT: 11.2500 mg | PACK | Freq: Once | INTRAMUSCULAR | Status: DC

## 2024-04-17 MED ORDER — LEUPROLIDE ACETATE (3 MONTH) 11.25 MG IM KIT: 11.2500 mg | PACK | INTRAMUSCULAR | Status: AC

## 2024-04-17 NOTE — Progress Notes (Addendum)
 New GYN presents for Endometriosis.  C/o pelvic pain 8-10/10 x 1+ year.  Last PAP 06/2023 NILM

## 2024-04-17 NOTE — Progress Notes (Unsigned)
 Patient ID: Wanda Nixon, female   DOB: 1990-07-02, 33 y.o.   MRN: [id number]  Chief Complaint  Patient presents with   New Patient (Initial Visit)    GYN    HPI Wanda Nixon is a 33 y.o. female.  [id number] Patient has a history of endometriosis and complains of pelvic pain and dysmenorrhea. She has nonsteroidals and rx oxycodone  and had been followed at a pain clinic at [facility]. She has used OCP and progestins in the past, no Lupron  HPI  Past Medical History:  Diagnosis Date   Asthma    Chronic constipation    Depression    Eczema    Fibroid    GAD (generalized anxiety disorder)    History of anemia    History of migraine    Wears glasses    Wears partial dentures    upper    Past Surgical History:  Procedure Laterality Date   LAPAROSCOPIC BILATERAL SALPINGECTOMY Bilateral 09/20/2021   Procedure: LAPAROSCOPIC BILATERAL SALPINGECTOMY;  Surgeon: Armond Cape, MD;  Location: [facility];  Service: Gynecology;  Laterality: Bilateral;   TUBAL LIGATION     WISDOM TOOTH EXTRACTION      Family History  Problem Relation Age of Onset   Diabetes Maternal Grandmother    Hypertension Maternal Grandmother    Stroke Maternal Grandmother    Hypertension Mother    Hyperlipidemia Mother    Hyperlipidemia Father     Social History Social History[1]  Allergies[2]  Current Outpatient Medications  Medication Sig Dispense Refill   citalopram  (CELEXA ) 20 MG tablet TAKE 1 TABLET (20 MG TOTAL) BY MOUTH DAILY. NEED PCP FOLLOW UP. 90 tablet 0   HYDROcodone -acetaminophen  (NORCO/VICODIN) 5-325 MG tablet Take 2 tablets by mouth every 4 (four) hours as needed. 10 tablet 0   ibuprofen  (ADVIL ) 800 MG tablet Take 1 tablet (800 mg total) by mouth 3 (three) times daily. 21 tablet 0   oxyCODONE  (OXYCONTIN ) 15 mg 12 hr tablet Take 1 tablet (15 mg total) by mouth every 12 (twelve) hours as needed (for endometriosis related pelvic pain. Follow up with OBGYN as soon as possible.). 20  tablet 0   oxyCODONE  (ROXICODONE ) 5 MG immediate release tablet Take 1 tablet (5 mg total) by mouth every 6 (six) hours as needed for breakthrough pain. 5 tablet 0   senna-docusate (SENOKOT-S) 8.6-50 MG tablet Take 1 tablet by mouth daily. 30 tablet 0   triamcinolone  cream (KENALOG ) 0.1 % Apply topically 2 (two) times daily. 75 g 2   Vitamin D , Ergocalciferol , (DRISDOL ) 1.25 MG (50000 UNIT) CAPS capsule TAKE 1 CAPSULE BY MOUTH EVERY 7 DAYS. WHEN YOU RUN OUT OF THESE 12 TABLETS, PLEASE RETURN TO CLINIC FOR VIT. D LEVEL RECHECK. 12 capsule 0   albuterol  (VENTOLIN  HFA) 108 (90 Base) MCG/ACT inhaler Inhale 2 puffs into the lungs every 6 (six) hours as needed for wheezing or shortness of breath. 8 g 2   cyclobenzaprine  (FLEXERIL ) 10 MG tablet Take 1 tablet (10 mg total) by mouth 2 (two) times daily as needed for muscle spasms. (Patient not taking: Reported on 04/17/2024) 20 tablet 0   ketorolac  (TORADOL ) 10 MG tablet Take 1 tablet (10 mg total) by mouth every 6 (six) hours as needed. (Patient not taking: Reported on 04/17/2024) 20 tablet 0   lidocaine  (LIDODERM ) 5 % Place 1 patch onto the skin daily. Remove & Discard patch within 12 hours or as directed by MD (Patient not taking: Reported on 04/17/2024) 30 patch 0  meclizine  (ANTIVERT ) 25 MG tablet Take 1 tablet (25 mg total) by mouth 3 (three) times daily as needed. 30 tablet 0   naproxen  (NAPROSYN ) 500 MG tablet Take 1 tablet (500 mg total) by mouth 2 (two) times daily. (Patient not taking: Reported on 04/17/2024) 30 tablet 0   ondansetron  (ZOFRAN -ODT) 4 MG disintegrating tablet Take 1 tablet (4 mg total) by mouth every 8 (eight) hours as needed. 20 tablet 0   Current Facility-Administered Medications  Medication Dose Route Frequency Provider Last Rate Last Admin   leuprolide  (LUPRON ) injection 11.25 mg  11.25 mg Intramuscular Q90 days         Review of Systems Review of Systems  Constitutional: Negative.   Respiratory: Negative.     Cardiovascular: Negative.   Gastrointestinal: Negative.   Genitourinary:  Positive for menstrual problem and pelvic pain. Negative for vaginal bleeding.    Blood pressure 121/71, pulse 84, height 4' 11 (1.499 m), weight 120 lb (54.4 kg), last menstrual period 03/09/2024.  Physical Exam Physical Exam Vitals and nursing note reviewed. Exam conducted with a chaperone present.  Constitutional:      Appearance: Normal appearance.  Cardiovascular:     Rate and Rhythm: Normal rate.  Pulmonary:     Effort: Pulmonary effort is normal.  Abdominal:     General: Abdomen is flat.     Palpations: Abdomen is soft.  Skin:    General: Skin is warm.  Neurological:     Mental Status: She is alert.  Psychiatric:        Mood and Affect: Mood normal.        Behavior: Behavior normal.    Narrative & Impression  EXAM: US  Pelvis, Complete Transabdominal with Doppler 11/21/2023 07:59:30 PM   TECHNIQUE: Transabdominal elvic duplex ultrasound using B-mode/gray scaled imaging with Doppler spectral analysis and color flow was obtained.   COMPARISON: None available   CLINICAL HISTORY: Pelvic pain; LLQ pain.   FINDINGS:   UTERUS: Uterus measures 8.5 x 4.8 x 5.8 cm (calculated volume 124 ml). Uterus demonstrates normal myometrial echotexture.   ENDOMETRIAL STRIPE: Endometrium measures 13 mm. Endometrial stripe is within normal limits.   RIGHT OVARY: Right ovary is not discretely visualized due to overlying bowel gas.   LEFT OVARY: Left ovary measures 3.1 x 1.7 x 2.2 cm (calculated volume 5.9 ml). Left ovary is within normal limits. There is normal arterial and venous Doppler flow.   FREE FLUID: No free fluid.   IMPRESSION: 1. No acute findings. 2. No evidence of left adnexal torsion. 3. Right ovary not visualized due to overlying bowel gas.   Electronically signed by: Pinkie Pebbles MD 11/21/2023 08:21 PM EDT RP Workstation: [id number]      Data Reviewed Bx results and op  note  Assessment Endometriosis determined by laparoscopy - Plan: leuprolide  (LUPRON ) injection 11.25 mg, DISCONTINUED: leuprolide  (LUPRON ) injection 11.25 mg  Pelvic pain - Plan: leuprolide  (LUPRON ) injection 11.25 mg, DISCONTINUED: leuprolide  (LUPRON ) injection 11.25 mg Add back progestin ordered  Plan Meds ordered this encounter  Medications   DISCONTD: leuprolide  (LUPRON ) injection 11.25 mg   leuprolide  (LUPRON ) injection 11.25 mg   RTC 3 mo for f/u    Lynwood Solomons 04/17/2024, 5:35 PM       [1]  Social History Tobacco Use   Smoking status: Never   Smokeless tobacco: Never  Vaping Use   Vaping status: Never Used  Substance Use Topics   Alcohol  use: Not Currently    Comment: Occasional   Drug use:  Never  [2]  Allergies Allergen Reactions   Tomato Hives and Itching   Augmentin  [Amoxicillin -Pot Clavulanate] Diarrhea   Mushroom Extract Complex (Obsolete) Hives and Itching   Reglan  [Metoclopramide ] Anxiety    Pt reports makes her anxiety worse.

## 2024-04-20 ENCOUNTER — Encounter: Payer: Self-pay | Admitting: Family Medicine

## 2024-04-22 ENCOUNTER — Encounter: Payer: Self-pay | Admitting: Family Medicine

## 2024-04-22 ENCOUNTER — Ambulatory Visit: Admitting: Family Medicine

## 2024-04-22 VITALS — BP 126/81 | HR 92 | Ht 59.0 in | Wt 117.0 lb

## 2024-04-22 DIAGNOSIS — R102 Pelvic and perineal pain unspecified side: Secondary | ICD-10-CM | POA: Diagnosis not present

## 2024-04-22 MED ORDER — OXYCODONE HCL ER 15 MG PO T12A: 15.0000 mg | EXTENDED_RELEASE_TABLET | Freq: Two times a day (BID) | ORAL | 0 refills | Status: DC | PRN

## 2024-04-22 MED ORDER — KETOROLAC TROMETHAMINE 30 MG/ML IJ SOLN
30.0000 mg | Freq: Once | INTRAMUSCULAR | Status: AC
  Administered 2024-04-22: 30 mg via INTRAMUSCULAR

## 2024-04-22 NOTE — Progress Notes (Signed)
" ° ° °  SUBJECTIVE:   CHIEF COMPLAINT / HPI:   Pain management- Wednesday met with GYN, with do lupron  injections, MRI still pending shara - has to wait until March? To do first injection  - considering surgery if that is not helpful -just got a promotion at work - continues to use tylenol /ibuprofen  for breakthrough pain - tries to limit use of oxycontin  to 1 daily at most, usually during the week  PERTINENT  PMH / PSH: endometriosis  OBJECTIVE:   BP 126/81   Pulse 92   Ht 4' 11 (1.499 m)   Wt 117 lb (53.1 kg)   LMP 03/09/2024 (Exact Date)   SpO2 100%   BMI 23.63 kg/m   General: Fatigued appearing Respiratory: even, unlabored breathing  ASSESSMENT/PLAN:   Assessment & Plan Pelvic pain -advised patient to try and get on cancellation list at GYN - refilled oxycontin  15 mg  - gave toradol  shot in office today - follow up in 4 weeks w/ Dr. Lafe for ongoing management - removed one time scripts from outside offices to avoid confusion.    Lucie Pinal, DO [facility] "

## 2024-04-22 NOTE — Assessment & Plan Note (Signed)
-  advised patient to try and get on cancellation list at GYN - refilled oxycontin  15 mg  - gave toradol  shot in office today - follow up in 4 weeks w/ Dr. Lafe for ongoing management - removed one time scripts from outside offices to avoid confusion.

## 2024-04-22 NOTE — Patient Instructions (Signed)
 It was wonderful to see you today!  Today we gave you a Toradol  injection to help with some short-term relief from your endometriosis pain.  I have also refilled your prescription for extended release OxyContin  to allow you to continue to manage your pain at home.  Please continue to call the GYN office and try to get on their cancellation's list to see if you can get in sooner to have your leuprolide  injection.  We will see you back in the office on January 12 to refill your prescriptions.  Please call [phone] with any questions about today's appointment.   If you need any additional refills, please call your pharmacy before calling the office.  Lucie Pinal, DO Family Medicine

## 2024-04-28 ENCOUNTER — Other Ambulatory Visit: Payer: Self-pay | Admitting: Family Medicine

## 2024-04-28 DIAGNOSIS — F411 Generalized anxiety disorder: Secondary | ICD-10-CM

## 2024-05-01 ENCOUNTER — Telehealth: Payer: Self-pay

## 2024-05-01 ENCOUNTER — Ambulatory Visit: Admitting: Family Medicine

## 2024-05-01 ENCOUNTER — Encounter: Payer: Self-pay | Admitting: Family Medicine

## 2024-05-01 VITALS — BP 131/90 | HR 85 | Ht 59.0 in | Wt 120.0 lb

## 2024-05-01 DIAGNOSIS — R102 Pelvic and perineal pain unspecified side: Secondary | ICD-10-CM

## 2024-05-01 MED ORDER — TRAZODONE HCL 50 MG PO TABS: 50.0000 mg | ORAL_TABLET | Freq: Every evening | ORAL | 3 refills | Status: DC | PRN

## 2024-05-01 MED ORDER — OXYCODONE HCL ER 15 MG PO T12A: 15.0000 mg | EXTENDED_RELEASE_TABLET | Freq: Two times a day (BID) | ORAL | 0 refills | Status: DC | PRN

## 2024-05-01 NOTE — Progress Notes (Signed)
" ° ° °  SUBJECTIVE:   CHIEF COMPLAINT / HPI:   Patient presents for pain management - She has known endometriosis and has failed OCPs - tylenol  and ibuprofen  alone are not adequate to control her pain - stable on oxycontin  XR 15 mg BID for pain management - recently seen by GYN, will be starting lupron  injections, unable to get the first one until March - she is on the cancellation list for their practice in case an earlier appointment becomes available - requesting a refill of her oxycontin , last filled on 12/22 by me - PDMP reviewed, no recent scripts other than what she has received here. - also having difficulty sleeping due to the pain  PERTINENT  PMH / PSH: Pelvic pain due to endometriosis  OBJECTIVE:   BP (!) 131/90   Pulse 85   Ht 4' 11 (1.499 m)   Wt 120 lb (54.4 kg)   LMP 04/30/2024 (Exact Date)   SpO2 99%   BMI 24.24 kg/m   General: Fatigued appearing Respiratory: even, unlabored breathing  ASSESSMENT/PLAN:   Assessment & Plan Pelvic pain - extended prescription of oxycontin  to 20 days  - reiterated that she should not use it in the evenings if she doesn't need it - discussed that she will likely need to wean down when the she starts the lupron  - patient is agreeable to starting a pain contract and undergoing urine testing if required - she will continue to call the GYN office and try to be seen sooner.  - begin trazodone 50 mg at bedtime for pain associated insomnia -follow up on 1/12 with PCP    Lucie Pinal, DO [facility] "

## 2024-05-01 NOTE — Assessment & Plan Note (Signed)
-   extended prescription of oxycontin  to 20 days  - reiterated that she should not use it in the evenings if she doesn't need it - discussed that she will likely need to wean down when the she starts the lupron  - patient is agreeable to starting a pain contract and undergoing urine testing if required - she will continue to call the GYN office and try to be seen sooner.  - begin trazodone 50 mg at bedtime for pain associated insomnia -follow up on 1/12 with PCP

## 2024-05-01 NOTE — Telephone Encounter (Signed)
 See other phone note

## 2024-05-01 NOTE — Telephone Encounter (Signed)
 Patient calls nurse line requesting a refill on pain medication.   She reports she is running low. She reports she will be out as of 1/2. She reports she does not want to go to the ED for medications.   She reports her next GYN apt is scheduled for March. She has not had a Lupron  injection as of yet.   She has an apt with PCP on 01/12, however will be long out of medication by then.   She reports continued excruciating pain. She reports taking (2) oxycodone  per day for relief.   Patient scheduled with Sheppard Pinal this afternoon for evaluation.

## 2024-05-01 NOTE — Patient Instructions (Signed)
 It was wonderful to see you today!  I have extended your prescription for your pain medication. Please continue to use it no more than 2 times a day as needed. You should keep your follow up appointment with Dr. Lafe on January 12 just to check in.   I have also prescribed low dose trazodone to help with your insomnia. You should take one pill at bed time. This medicine should help you fall asleep and stay asleep. If you have excessive drowsiness, please stop using the trazodone.   Please call [phone] with any questions about today's appointment.   If you need any additional refills, please call your pharmacy before calling the office.  Lucie Pinal, DO Family Medicine

## 2024-05-12 NOTE — Progress Notes (Unsigned)
" ° ° °  SUBJECTIVE:   CHIEF COMPLAINT / HPI:   Endometriosis pain Has seen OBGYN, awaiting Lupron  She has pain *** everyday? Pain is located in *** She needs to take oxy *** days a week for pain management when she is NOT menstruating. ***  UDS and pain contract today  PERTINENT  PMH / PSH: Reviewed.  OBJECTIVE:   LMP 04/30/2024 (Exact Date)   General: ***-appearing, no acute distress. HEENT: normocephalic, PERRLA, EOM grossly intact, MMM, bilateral TM visualized without erythema or bulging. Cardio: Regular rate, *** rhythm, no murmurs on exam. Pulm: Clear, no wheezing, no crackles. No increased work of breathing. Abdominal: bowel sounds present, soft, non-tender, non-distended. No HSM. Extremities: no peripheral edema. Moves all extremities equally. Neuro: Alert and oriented x3, speech normal in content, no facial asymmetry, strength intact and equal bilaterally in UE and LE, pupils equal and reactive to light.  Psych:  Cognition and judgment appear intact. Alert, communicative, and cooperative with normal attention span and concentration. No apparent delusions, illusions, hallucinations    ASSESSMENT/PLAN:   Assessment & Plan Endometriosis  Pelvic pain      Lauraine Norse, DO [facility] "

## 2024-05-13 ENCOUNTER — Ambulatory Visit: Payer: Self-pay | Admitting: Family Medicine

## 2024-05-13 DIAGNOSIS — N809 Endometriosis, unspecified: Secondary | ICD-10-CM

## 2024-05-13 DIAGNOSIS — R102 Pelvic and perineal pain unspecified side: Secondary | ICD-10-CM

## 2024-05-15 ENCOUNTER — Ambulatory Visit: Payer: Self-pay

## 2024-05-16 ENCOUNTER — Ambulatory Visit

## 2024-05-16 NOTE — Progress Notes (Unsigned)
" ° ° °  SUBJECTIVE:   CHIEF COMPLAINT / HPI:   Discussed the use of AI scribe software for clinical note transcription with the patient, who gave verbal consent to proceed.  History of Present Illness     PERTINENT  PMH / PSH: ***  OBJECTIVE:   LMP 04/30/2024 (Exact Date)   *** Physical Exam   ASSESSMENT/PLAN:   Assessment & Plan      Assessment and Plan Assessment & Plan      Gladis Church, DO [facility]  "

## 2024-05-17 ENCOUNTER — Encounter (HOSPITAL_BASED_OUTPATIENT_CLINIC_OR_DEPARTMENT_OTHER): Payer: Self-pay

## 2024-05-17 ENCOUNTER — Ambulatory Visit (HOSPITAL_BASED_OUTPATIENT_CLINIC_OR_DEPARTMENT_OTHER): Admitting: Family Medicine

## 2024-05-17 DIAGNOSIS — O368190 Decreased fetal movements, unspecified trimester, not applicable or unspecified: Secondary | ICD-10-CM | POA: Insufficient documentation

## 2024-05-17 DIAGNOSIS — L508 Other urticaria: Secondary | ICD-10-CM | POA: Insufficient documentation

## 2024-05-17 DIAGNOSIS — Z87898 Personal history of other specified conditions: Secondary | ICD-10-CM | POA: Insufficient documentation

## 2024-05-17 DIAGNOSIS — R42 Dizziness and giddiness: Secondary | ICD-10-CM | POA: Insufficient documentation

## 2024-05-17 DIAGNOSIS — R21 Rash and other nonspecific skin eruption: Secondary | ICD-10-CM | POA: Insufficient documentation

## 2024-05-17 DIAGNOSIS — N912 Amenorrhea, unspecified: Secondary | ICD-10-CM | POA: Insufficient documentation

## 2024-05-17 DIAGNOSIS — Z3A16 16 weeks gestation of pregnancy: Secondary | ICD-10-CM | POA: Insufficient documentation

## 2024-05-21 ENCOUNTER — Telehealth: Payer: Self-pay

## 2024-05-21 ENCOUNTER — Ambulatory Visit: Admitting: Family Medicine

## 2024-05-21 VITALS — BP 111/85 | HR 85 | Ht 59.0 in | Wt 117.4 lb

## 2024-05-21 DIAGNOSIS — R102 Pelvic and perineal pain unspecified side: Secondary | ICD-10-CM | POA: Diagnosis not present

## 2024-05-21 DIAGNOSIS — F411 Generalized anxiety disorder: Secondary | ICD-10-CM

## 2024-05-21 DIAGNOSIS — N809 Endometriosis, unspecified: Secondary | ICD-10-CM

## 2024-05-21 MED ORDER — OXYCODONE HCL ER 15 MG PO T12A: 15.0000 mg | EXTENDED_RELEASE_TABLET | Freq: Two times a day (BID) | ORAL | 0 refills | Status: DC | PRN

## 2024-05-21 MED ORDER — CITALOPRAM HYDROBROMIDE 20 MG PO TABS: 20.0000 mg | ORAL_TABLET | Freq: Every day | ORAL | 3 refills | Status: AC

## 2024-05-21 NOTE — Assessment & Plan Note (Signed)
 Chronic endometriosis with significant pain, exacerbated during menstruation. Managed with OxyContin  and ibuprofen . Awaiting OB GYN appointment for potential Lupron  injection. Insurance issues due to job loss, but new job offer expected to resolve this. - Continue OxyContin  for pain management. Have reviewed PDMP and patient does not have any overlaps, we are the only provider of opioids or other controlled substances at this time.  - Use ibuprofen  800 mg three times a day during menstruation. Also using heating pad.  - Await OB GYN appointment in March for potential Lupron  injection.Is on the cancellation list. - Counseled patient to take ibuprofen  with meals and use miralax  for bowel management while on chronic opioids.

## 2024-05-21 NOTE — Telephone Encounter (Signed)
 Called patient and advised of update.   She still has two pills remaining.   Advised her that I would check status again tomorrow morning.    Discussed potential for asking pharmacy for partial fill if insurance shara is still pending tomorrow morning/ afternoon.   Advised patient that I would keep her updated regarding status of PA.   Patient appreciative.   Chiquita JAYSON English, RN

## 2024-05-21 NOTE — Assessment & Plan Note (Signed)
 Exacerbated by job loss, housing instability, and upcoming move. Managed with breathing techniques and grounding exercises. Avoids Ativan  and has concerns about trazodone  given wants to be alert for her children.  - discontinued trazadone from medication list given patient is not taking.  - [facility]illed [facility]. - Continue using breathing techniques and grounding exercises. - Taught patient cyclic sighing technique for panic attacks. - Continue therapy sessions as scheduled.

## 2024-05-21 NOTE — Telephone Encounter (Signed)
 Patient calls nurse line regarding PA being needed for pain medication, oxycodone  15 mg.   PA submitted via covermymeds. Key: [id number]  Wanda JAYSON English, RN

## 2024-05-21 NOTE — Progress Notes (Signed)
" ° °  SUBJECTIVE:   CHIEF COMPLAINT / HPI:  Discussed the use of AI scribe software for clinical note transcription with the patient, who gave verbal consent to proceed.  History of Present Illness Wanda Nixon is a 34 year old female with endometriosis and anxiety who presents for pain management and anxiety evaluation.  Pelvic pain associated with endometriosis - Pain worsens during menstrual cycle, with severe pain and nausea on the first day of menses - Pain is severe enough to cause her to be bedridden during the first day of her period - Uses OxyContin  for pain control, which is effective - Uses ibuprofen , heating pads, and other nonpharmacologic measures during menses - Has pain between cycles as well.  - Has an appointment scheduled with OBGYN in march for lupron  injection to manage endometriosis.  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  - Has contacted her therapist for another session this week.   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  - Secured new job starting February 3rd    PERTINENT  PMH / PSH: H/o migraines, endometriosis  OBJECTIVE:  LMP 04/30/2024 (Exact Date)   General:  uncomfortable appearing, tearful Resp: Normal work of breathing on room air Neuro: Alert & Oriented, antalgic gait  Psyc: anxious, good insight, good judgement, tearful, conversant   ASSESSMENT/PLAN:   Assessment & Plan Endometriosis Pelvic pain Chronic endometriosis with significant pain, exacerbated during menstruation. Managed with OxyContin  and ibuprofen . Awaiting OB GYN appointment for potential Lupron  injection. Insurance issues due to job loss, but new job offer expected to resolve this. - Continue OxyContin  for pain management. Have reviewed PDMP and patient does not have any overlaps, we are the only provider of opioids or other controlled substances at this time.  - Use ibuprofen  800 mg three times a day during menstruation. Also using heating pad.  - Await OB GYN appointment in March for potential Lupron  injection.Is on the cancellation list. - Counseled patient to take ibuprofen  with meals and use miralax  for bowel management while on chronic opioids.   Generalized anxiety disorder Exacerbated by job loss, housing instability, and upcoming move. Managed with breathing techniques and grounding exercises. Avoids Ativan  and has concerns about trazodone  given wants to be alert for her children.  - discontinued trazadone from medication list given patient is not taking.  - [facility] - Continue using breathing techniques and grounding exercises. - Taught patient cyclic sighing technique for panic attacks. - Continue therapy sessions as scheduled.   Areta Saliva, MD [facility] "

## 2024-05-21 NOTE — Patient Instructions (Signed)
 It was wonderful to see you today.  Please bring ALL of your medications with you to every visit.    VISIT SUMMARY: During your visit, we discussed your ongoing management of endometriosis and anxiety. We reviewed your current treatments and made adjustments to help manage your symptoms more effectively.  YOUR PLAN: -ENDOMETRIOSIS: Endometriosis is a condition where tissue similar to the lining inside the uterus grows outside of it, causing pain and potentially other symptoms. You should continue using OxyContin  for pain management and take ibuprofen  800 mg three times a day during menstruation. Make sure to take the ibuprofen  with meals. We are awaiting your OB GYN appointment in March to discuss the potential for a Lupron  injection. Take miralax  and drink lots of water to maintain regular bowel movements while on opioids.   -GENERALIZED ANXIETY DISORDER: Generalized anxiety disorder is characterized by excessive worry about various aspects of life. Your anxiety has been exacerbated by recent job loss and housing instability. We refilled your Celexa  prescription, and you should continue using breathing techniques and grounding exercises. Consider trying the cyclic sighing technique for panic attacks and continue attending your therapy sessions as scheduled.  INSTRUCTIONS: Please follow up with your OB GYN in March for the potential Lupron  injection. Continue attending your therapy sessions and using the techniques discussed to manage your anxiety. If you experience any worsening of symptoms or have concerns, please contact our office. You are due for a pap smear you can either get this at your upcoming obgyn appointment or schedule another appointment with us .   Contains text generated by [employer].   Thank you for choosing [facility].   Please call [phone] with any questions about today's appointment.   Areta Saliva, MD  Family Medicine

## 2024-05-22 ENCOUNTER — Encounter: Payer: Self-pay | Admitting: Family Medicine

## 2024-05-22 DIAGNOSIS — R102 Pelvic and perineal pain unspecified side: Secondary | ICD-10-CM

## 2024-05-22 DIAGNOSIS — N809 Endometriosis, unspecified: Secondary | ICD-10-CM

## 2024-05-23 MED ORDER — OXYCODONE HCL ER 15 MG PO T12A: 15.0000 mg | EXTENDED_RELEASE_TABLET | Freq: Two times a day (BID) | ORAL | 0 refills | Status: DC | PRN

## 2024-05-23 NOTE — Addendum Note (Signed)
 Addended by: Barabara Motz on: 05/23/2024 05:22 PM   Modules accepted: Orders

## 2024-05-23 NOTE — Telephone Encounter (Signed)
 Patient returns call to nurse line regarding OxyContin  prescription.   She reports that she is going to receive partial fill of 2 tablets to get her through until we receive auth from insurance.   Spoke with Signe at the pharmacy. He states that we will need to send another prescription for remaining 38 tablets in order for patient to receive remainder of prescription.   Forwarding to prescriber.   Chiquita JAYSON English, RN

## 2024-05-23 NOTE — Telephone Encounter (Signed)
 Called [employer] [place]  plan. They advised that PA needed to be marked as urgent in order for them to review today.   Faxed letter from Dr. Nicholas to [phone].  Called patient and advised of update.   Patient reports that she cannot afford to pick up partial refill without insurance coverage.   She is asking if there is anything else that Dr. Nicholas can prescribe her while we are working out her insurance.   She states that ibuprofen  800 mg has been only minimally effective.   Will forward to provider for further advisement.   Chiquita JAYSON English, RN

## 2024-05-24 NOTE — Telephone Encounter (Signed)
 Called and spoke with [employer] agent.   Advised that PA was still pending, could take up to 72 hours.   Called patient. She would like provider to [facility] [place]. She is asking if supply can be enough to get her through at least Monday due to the bad weather forecast.   Will forward request to Dr. Nicholas.   Chiquita JAYSON English, RN

## 2024-05-25 ENCOUNTER — Encounter: Payer: Self-pay | Admitting: Family Medicine

## 2024-05-28 MED ORDER — OXYCODONE HCL 5 MG PO CAPS: 5.0000 mg | ORAL_CAPSULE | ORAL | 0 refills | Status: AC | PRN

## 2024-05-28 NOTE — Telephone Encounter (Signed)
 Indexed approval letter to patients media.

## 2024-05-30 MED ORDER — OXYCODONE HCL 5 MG PO CAPS: 5.0000 mg | ORAL_CAPSULE | ORAL | 0 refills | Status: DC | PRN

## 2024-06-05 MED ORDER — OXYCODONE HCL ER 15 MG PO T12A: 15.0000 mg | EXTENDED_RELEASE_TABLET | Freq: Two times a day (BID) | ORAL | 0 refills | Status: AC | PRN

## 2024-06-05 NOTE — Addendum Note (Signed)
 Addended by: Dayvon Dax on: 06/05/2024 12:29 PM   Modules accepted: Orders

## 2024-07-16 ENCOUNTER — Ambulatory Visit: Payer: Self-pay
